# Patient Record
Sex: Male | Born: 1935 | Race: Black or African American | Hispanic: No | Marital: Married | Smoking: Never smoker
Health system: Southern US, Community
[De-identification: ages and names within clinical notes are randomized; demographics above are authoritative.]

## PROBLEM LIST (undated history)

## (undated) DIAGNOSIS — N419 Inflammatory disease of prostate, unspecified: Secondary | ICD-10-CM

## (undated) DIAGNOSIS — Z9181 History of falling: Secondary | ICD-10-CM

## (undated) DIAGNOSIS — Z8546 Personal history of malignant neoplasm of prostate: Secondary | ICD-10-CM

## (undated) DIAGNOSIS — R569 Unspecified convulsions: Secondary | ICD-10-CM

## (undated) DIAGNOSIS — I10 Essential (primary) hypertension: Secondary | ICD-10-CM

## (undated) DIAGNOSIS — R7309 Other abnormal glucose: Secondary | ICD-10-CM

## (undated) DIAGNOSIS — G4733 Obstructive sleep apnea (adult) (pediatric): Secondary | ICD-10-CM

## (undated) DIAGNOSIS — I351 Nonrheumatic aortic (valve) insufficiency: Secondary | ICD-10-CM

## (undated) DIAGNOSIS — E739 Lactose intolerance, unspecified: Secondary | ICD-10-CM

## (undated) DIAGNOSIS — F068 Other specified mental disorders due to known physiological condition: Secondary | ICD-10-CM

## (undated) DIAGNOSIS — Z8679 Personal history of other diseases of the circulatory system: Secondary | ICD-10-CM

## (undated) DIAGNOSIS — I77810 Thoracic aortic ectasia: Secondary | ICD-10-CM

## (undated) HISTORY — DX: History of falling: Z91.81

## (undated) HISTORY — DX: Personal history of other diseases of the circulatory system: Z86.79

## (undated) HISTORY — DX: Unspecified convulsions: R56.9

## (undated) HISTORY — DX: Personal history of malignant neoplasm of prostate: Z85.46

## (undated) HISTORY — DX: Other specified mental disorders due to known physiological condition: F06.8

## (undated) HISTORY — PX: CHOLECYSTECTOMY: SHX55

## (undated) HISTORY — PX: PROSTATE SURGERY: SHX751

## (undated) HISTORY — DX: Lactose intolerance, unspecified: E73.9

## (undated) HISTORY — DX: Nonrheumatic aortic (valve) insufficiency: I35.1

## (undated) HISTORY — DX: Essential (primary) hypertension: I10

## (undated) HISTORY — DX: Other abnormal glucose: R73.09

## (undated) HISTORY — PX: GANGLION CYST EXCISION: SHX1691

## (undated) HISTORY — DX: Obstructive sleep apnea (adult) (pediatric): G47.33

---

## 1998-05-16 ENCOUNTER — Other Ambulatory Visit: Admission: RE | Admit: 1998-05-16 | Discharge: 1998-05-16 | Payer: Self-pay | Admitting: Gastroenterology

## 1999-08-15 ENCOUNTER — Ambulatory Visit (HOSPITAL_COMMUNITY): Admission: RE | Admit: 1999-08-15 | Discharge: 1999-08-16 | Payer: Self-pay | Admitting: Surgery

## 1999-08-15 ENCOUNTER — Encounter: Payer: Self-pay | Admitting: Surgery

## 2000-05-19 ENCOUNTER — Encounter: Payer: Self-pay | Admitting: Internal Medicine

## 2000-05-19 LAB — CONVERTED CEMR LAB

## 2004-02-14 ENCOUNTER — Ambulatory Visit (HOSPITAL_BASED_OUTPATIENT_CLINIC_OR_DEPARTMENT_OTHER): Admission: RE | Admit: 2004-02-14 | Discharge: 2004-02-14 | Payer: Self-pay | Admitting: Internal Medicine

## 2004-03-13 ENCOUNTER — Ambulatory Visit: Payer: Self-pay | Admitting: Internal Medicine

## 2004-05-07 ENCOUNTER — Ambulatory Visit: Payer: Self-pay | Admitting: Internal Medicine

## 2004-07-05 ENCOUNTER — Ambulatory Visit: Payer: Self-pay | Admitting: Internal Medicine

## 2004-07-12 ENCOUNTER — Ambulatory Visit: Payer: Self-pay | Admitting: Internal Medicine

## 2004-08-06 ENCOUNTER — Ambulatory Visit: Payer: Self-pay | Admitting: Internal Medicine

## 2004-10-07 ENCOUNTER — Ambulatory Visit: Payer: Self-pay | Admitting: Internal Medicine

## 2004-12-18 ENCOUNTER — Ambulatory Visit: Payer: Self-pay | Admitting: Internal Medicine

## 2005-01-15 ENCOUNTER — Ambulatory Visit: Payer: Self-pay | Admitting: Internal Medicine

## 2005-02-25 ENCOUNTER — Ambulatory Visit: Payer: Self-pay | Admitting: Internal Medicine

## 2005-03-21 ENCOUNTER — Ambulatory Visit (HOSPITAL_COMMUNITY): Admission: RE | Admit: 2005-03-21 | Discharge: 2005-03-21 | Payer: Self-pay | Admitting: Urology

## 2005-04-16 ENCOUNTER — Ambulatory Visit: Payer: Self-pay | Admitting: Internal Medicine

## 2005-04-23 ENCOUNTER — Ambulatory Visit: Payer: Self-pay | Admitting: Internal Medicine

## 2005-06-04 ENCOUNTER — Ambulatory Visit: Payer: Self-pay | Admitting: Internal Medicine

## 2005-07-30 ENCOUNTER — Ambulatory Visit: Payer: Self-pay | Admitting: Internal Medicine

## 2005-08-27 ENCOUNTER — Ambulatory Visit: Payer: Self-pay | Admitting: Internal Medicine

## 2005-10-17 ENCOUNTER — Ambulatory Visit: Payer: Self-pay | Admitting: Internal Medicine

## 2005-11-21 ENCOUNTER — Ambulatory Visit: Payer: Self-pay | Admitting: Internal Medicine

## 2006-01-01 ENCOUNTER — Ambulatory Visit: Payer: Self-pay | Admitting: Internal Medicine

## 2006-04-06 ENCOUNTER — Ambulatory Visit: Payer: Self-pay | Admitting: Internal Medicine

## 2006-07-08 ENCOUNTER — Ambulatory Visit: Payer: Self-pay | Admitting: Internal Medicine

## 2006-07-08 LAB — CONVERTED CEMR LAB
ALT: 19 units/L (ref 0–40)
AST: 23 units/L (ref 0–37)
Albumin: 3.5 g/dL (ref 3.5–5.2)
Alkaline Phosphatase: 78 units/L (ref 39–117)
BUN: 14 mg/dL (ref 6–23)
Bilirubin, Direct: 0.1 mg/dL (ref 0.0–0.3)
CO2: 29 meq/L (ref 19–32)
Calcium: 9.6 mg/dL (ref 8.4–10.5)
Chloride: 104 meq/L (ref 96–112)
Cholesterol: 163 mg/dL (ref 0–200)
Creatinine, Ser: 0.7 mg/dL (ref 0.4–1.5)
GFR calc Af Amer: 143 mL/min
GFR calc non Af Amer: 118 mL/min
Glucose, Bld: 113 mg/dL — ABNORMAL HIGH (ref 70–99)
HDL: 60.9 mg/dL (ref >39.0)
LDL Cholesterol: 76 mg/dL (ref 0–99)
Potassium: 3.6 meq/L (ref 3.5–5.1)
Sodium: 142 meq/L (ref 135–145)
Total Bilirubin: 1 mg/dL (ref 0.3–1.2)
Total CHOL/HDL Ratio: 2.7
Total Protein: 7.5 g/dL (ref 6.0–8.3)
Triglycerides: 133 mg/dL (ref 0–149)
VLDL: 27 mg/dL (ref 0–40)

## 2006-10-07 ENCOUNTER — Ambulatory Visit: Payer: Self-pay | Admitting: Internal Medicine

## 2006-12-29 ENCOUNTER — Encounter: Payer: Self-pay | Admitting: Internal Medicine

## 2006-12-29 DIAGNOSIS — G4733 Obstructive sleep apnea (adult) (pediatric): Secondary | ICD-10-CM

## 2006-12-29 DIAGNOSIS — I10 Essential (primary) hypertension: Secondary | ICD-10-CM

## 2006-12-29 DIAGNOSIS — Z8546 Personal history of malignant neoplasm of prostate: Secondary | ICD-10-CM

## 2006-12-29 HISTORY — DX: Personal history of malignant neoplasm of prostate: Z85.46

## 2006-12-29 HISTORY — DX: Obstructive sleep apnea (adult) (pediatric): G47.33

## 2006-12-29 HISTORY — DX: Essential (primary) hypertension: I10

## 2007-02-09 ENCOUNTER — Ambulatory Visit: Payer: Self-pay | Admitting: Internal Medicine

## 2007-02-09 DIAGNOSIS — Z8673 Personal history of transient ischemic attack (TIA), and cerebral infarction without residual deficits: Secondary | ICD-10-CM

## 2007-02-09 DIAGNOSIS — Z8679 Personal history of other diseases of the circulatory system: Secondary | ICD-10-CM

## 2007-02-09 HISTORY — DX: Personal history of other diseases of the circulatory system: Z86.79

## 2007-02-24 ENCOUNTER — Encounter: Admission: RE | Admit: 2007-02-24 | Discharge: 2007-04-19 | Payer: Self-pay | Admitting: Internal Medicine

## 2007-03-22 ENCOUNTER — Encounter: Payer: Self-pay | Admitting: Internal Medicine

## 2007-04-14 ENCOUNTER — Encounter: Payer: Self-pay | Admitting: Internal Medicine

## 2007-05-12 ENCOUNTER — Ambulatory Visit: Payer: Self-pay | Admitting: Internal Medicine

## 2007-05-12 DIAGNOSIS — G309 Alzheimer's disease, unspecified: Secondary | ICD-10-CM

## 2007-05-12 DIAGNOSIS — F068 Other specified mental disorders due to known physiological condition: Secondary | ICD-10-CM

## 2007-05-12 DIAGNOSIS — F0280 Dementia in other diseases classified elsewhere without behavioral disturbance: Secondary | ICD-10-CM

## 2007-05-12 HISTORY — DX: Other specified mental disorders due to known physiological condition: F06.8

## 2007-05-13 LAB — CONVERTED CEMR LAB
Basophils Absolute: 0 10*3/uL (ref 0.0–0.1)
CO2: 31 meq/L (ref 19–32)
Chloride: 102 meq/L (ref 96–112)
Cholesterol: 195 mg/dL (ref 0–200)
Eosinophils Absolute: 0.1 10*3/uL (ref 0.0–0.6)
Eosinophils Relative: 1.4 % (ref 0.0–5.0)
GFR calc non Af Amer: 53 mL/min
Hemoglobin: 15.1 g/dL (ref 13.0–17.0)
Monocytes Absolute: 0.8 10*3/uL — ABNORMAL HIGH (ref 0.2–0.7)
Neutro Abs: 4.9 10*3/uL (ref 1.4–7.7)
Neutrophils Relative %: 61 % (ref 43.0–77.0)
Platelets: 230 10*3/uL (ref 150–400)
RDW: 13.6 % (ref 11.5–14.6)
Triglycerides: 105 mg/dL (ref 0–149)
VLDL: 21 mg/dL (ref 0–40)

## 2007-08-18 ENCOUNTER — Ambulatory Visit: Payer: Self-pay | Admitting: Internal Medicine

## 2007-08-18 LAB — HM COLONOSCOPY

## 2007-10-14 ENCOUNTER — Telehealth: Payer: Self-pay | Admitting: Internal Medicine

## 2007-12-08 ENCOUNTER — Ambulatory Visit: Payer: Self-pay | Admitting: Internal Medicine

## 2008-05-24 ENCOUNTER — Ambulatory Visit: Payer: Self-pay | Admitting: Internal Medicine

## 2008-05-25 LAB — CONVERTED CEMR LAB
BUN: 17 mg/dL (ref 6–23)
CO2: 32 meq/L (ref 19–32)
Creatinine, Ser: 1.2 mg/dL (ref 0.4–1.5)
GFR calc Af Amer: 76 mL/min
GFR calc non Af Amer: 63 mL/min
Glucose, Bld: 114 mg/dL — ABNORMAL HIGH (ref 70–99)
HDL: 61.5 mg/dL (ref >39.0)
LDL Cholesterol: 91 mg/dL (ref 0–99)
Potassium: 4.1 meq/L (ref 3.5–5.1)
Sodium: 142 meq/L (ref 135–145)
Triglycerides: 139 mg/dL (ref 0–149)

## 2008-09-26 ENCOUNTER — Encounter: Payer: Self-pay | Admitting: Internal Medicine

## 2008-12-12 ENCOUNTER — Ambulatory Visit: Payer: Self-pay | Admitting: Internal Medicine

## 2009-01-25 ENCOUNTER — Ambulatory Visit: Payer: Self-pay | Admitting: Internal Medicine

## 2009-03-07 ENCOUNTER — Telehealth: Payer: Self-pay | Admitting: Internal Medicine

## 2009-03-09 ENCOUNTER — Encounter: Payer: Self-pay | Admitting: Internal Medicine

## 2009-06-25 ENCOUNTER — Ambulatory Visit: Payer: Self-pay | Admitting: Internal Medicine

## 2009-06-26 LAB — CONVERTED CEMR LAB
Chloride: 105 meq/L (ref 96–112)
HDL: 68.9 mg/dL (ref >39.00)
TSH: 1.36 microintl units/mL (ref 0.35–5.50)
Total CHOL/HDL Ratio: 2
Triglycerides: 81 mg/dL (ref 0.0–149.0)
VLDL: 16.2 mg/dL (ref 0.0–40.0)

## 2009-07-27 ENCOUNTER — Ambulatory Visit: Payer: Self-pay | Admitting: Internal Medicine

## 2009-08-01 ENCOUNTER — Encounter: Payer: Self-pay | Admitting: Internal Medicine

## 2009-08-02 ENCOUNTER — Encounter: Payer: Self-pay | Admitting: Internal Medicine

## 2009-08-02 ENCOUNTER — Ambulatory Visit: Payer: Self-pay

## 2009-09-21 ENCOUNTER — Ambulatory Visit: Payer: Self-pay | Admitting: Internal Medicine

## 2009-09-21 DIAGNOSIS — R739 Hyperglycemia, unspecified: Secondary | ICD-10-CM

## 2009-09-21 DIAGNOSIS — R7309 Other abnormal glucose: Secondary | ICD-10-CM

## 2009-09-21 HISTORY — DX: Other abnormal glucose: R73.09

## 2009-09-21 LAB — CONVERTED CEMR LAB
GFR calc non Af Amer: 82.29 mL/min (ref >60)
Hgb A1c MFr Bld: 5.8 % (ref 4.6–6.5)
Potassium: 3.7 meq/L (ref 3.5–5.1)
Sodium: 146 meq/L — ABNORMAL HIGH (ref 135–145)

## 2009-10-26 ENCOUNTER — Observation Stay (HOSPITAL_COMMUNITY): Admission: EM | Admit: 2009-10-26 | Discharge: 2009-10-30 | Payer: Self-pay | Admitting: Emergency Medicine

## 2009-11-02 ENCOUNTER — Telehealth: Payer: Self-pay | Admitting: Internal Medicine

## 2009-11-12 ENCOUNTER — Ambulatory Visit: Payer: Self-pay | Admitting: Internal Medicine

## 2009-11-19 ENCOUNTER — Encounter: Payer: Self-pay | Admitting: Internal Medicine

## 2009-11-21 ENCOUNTER — Encounter: Payer: Self-pay | Admitting: Internal Medicine

## 2009-11-27 ENCOUNTER — Telehealth: Payer: Self-pay | Admitting: Internal Medicine

## 2009-11-28 ENCOUNTER — Encounter: Payer: Self-pay | Admitting: Internal Medicine

## 2009-11-29 ENCOUNTER — Encounter: Payer: Self-pay | Admitting: Internal Medicine

## 2009-12-04 ENCOUNTER — Encounter: Payer: Self-pay | Admitting: Internal Medicine

## 2009-12-07 ENCOUNTER — Inpatient Hospital Stay (HOSPITAL_COMMUNITY): Admission: EM | Admit: 2009-12-07 | Discharge: 2009-12-12 | Payer: Self-pay | Admitting: Emergency Medicine

## 2009-12-09 ENCOUNTER — Encounter (INDEPENDENT_AMBULATORY_CARE_PROVIDER_SITE_OTHER): Payer: Self-pay | Admitting: Internal Medicine

## 2009-12-09 ENCOUNTER — Ambulatory Visit: Payer: Self-pay | Admitting: Vascular Surgery

## 2009-12-10 ENCOUNTER — Encounter (INDEPENDENT_AMBULATORY_CARE_PROVIDER_SITE_OTHER): Payer: Self-pay | Admitting: Internal Medicine

## 2009-12-11 ENCOUNTER — Encounter: Payer: Self-pay | Admitting: Internal Medicine

## 2009-12-14 ENCOUNTER — Ambulatory Visit: Payer: Self-pay | Admitting: Internal Medicine

## 2010-01-04 ENCOUNTER — Ambulatory Visit: Payer: Self-pay | Admitting: Internal Medicine

## 2010-01-10 ENCOUNTER — Encounter: Payer: Self-pay | Admitting: Internal Medicine

## 2010-02-04 ENCOUNTER — Ambulatory Visit: Payer: Self-pay | Admitting: Internal Medicine

## 2010-02-25 ENCOUNTER — Encounter: Payer: Self-pay | Admitting: Internal Medicine

## 2010-04-05 ENCOUNTER — Ambulatory Visit: Payer: Self-pay | Admitting: Internal Medicine

## 2010-04-08 LAB — CONVERTED CEMR LAB
Creatinine, Ser: 1.3 mg/dL (ref 0.4–1.5)
Potassium: 3.7 meq/L (ref 3.5–5.1)
Sodium: 145 meq/L (ref 135–145)

## 2010-05-27 ENCOUNTER — Telehealth: Payer: Self-pay | Admitting: Internal Medicine

## 2010-06-06 NOTE — Progress Notes (Signed)
Summary: Speech Therapy Order  Phone Note From Other Clinic   Caller: [facility] Call For: Dr. Cato Mulligan Summary of Call: Evaluate and treat pt for Speech Therapy order, please? [phone] Initial call taken by: Lynann Beaver CMA,  November 02, 2009 2:26 PM  Follow-up for Phone Call        ok Follow-up by: Birdie Sons MD,  November 03, 2009 6:29 PM  Additional Follow-up for Phone Call Additional follow up Details #1::        Verbal order given. Additional Follow-up by: Lynann Beaver CMA,  November 06, 2009 11:20 AM

## 2010-06-06 NOTE — Assessment & Plan Note (Signed)
Summary: ?mini stroke/cjr   Vital Signs:  Patient profile:   77 year old male Height:      68 inches Weight:      174 pounds BMI:     26.55 Temp:     98.0 degrees F oral Pulse rate:   74 / minute BP sitting:   160 / 98  (left arm) Cuff size:   regular  Vitals Entered By: Kern Reap CMA Duncan Dull) (July 27, 2009 8:10 AM)  Serial Vital Signs/Assessments:  Time      Position  BP       Pulse  Resp  Temp     By                     138/88                         Birdie Sons MD  CC: possible mini stroke Is Patient Diabetic? No   CC:  possible mini stroke.  History of Present Illness: comes in with wife he and she report that 2 days ago he had an acute episode of confusion and some "muscle weakness". Wife reports that he couldn't comprehend how to raise a glass of water, difficulty remembering how to get to bedroom. Pt slept well that night but didn't remember any of the acute events.  Otherwise has been doing well All other systems reviewed and were negative   Current Problems (verified): 1)  Dementia  (ICD-294.8) 2)  Cerebrovascular Accident, Hx of  (ICD-V12.50) 3)  Obstructive Sleep Apnea  (ICD-327.23) 4)  Hemorrhoids  (ICD-455.6) 5)  Hypertension  (ICD-401.9) 6)  Prostate Cancer, Hx of  (ICD-V10.46)  Current Medications (verified): 1)  Benicar Hct 40-12.5 Mg Tabs (Olmesartan Medoxomil-Hctz) .... One By Mouth Daily 2)  Felodipine 5 Mg Tb24 (Felodipine) .... Take 1 Tablet By Mouth Once A Day 3)  Lisinopril 40 Mg Tabs (Lisinopril) .... One By Mouth Daily 4)  Aspirin 325 Mg  Tbec (Aspirin) .... Once Daily 5)  Exelon 4.6 Mg/24hr  Pt24 (Rivastigmine) .... Change Every 24 Hrs 6)  Namenda 10 Mg Tabs (Memantine Hcl) .... Take 1 Tablet By Mouth Two Times A Day  Allergies (verified): No Known Drug Allergies  Past History:  Past Medical History: Last updated: 02/09/2007 Prostate cancer, hx of  2006 (small focus) Hypertension hemorrhoids lactose intolerance cerebrovascular  dysfunction obstructive sleep apnea Cerebrovascular accident, hx of  Past Surgical History: Last updated: 12/29/2006 Cholecystectomy  03/00 cryotherapy for prostate ca Vonita Moss) Ganglion cyst on wrist  Family History: Last updated: May 30, 2007 father deceased 28 unknown cause mother deceased stroke age 46 Family History of Stroke F 1st degree relative <60  Social History: Last updated: 02/09/2007 Married Never Smoked Regular exercise-no  Risk Factors: Exercise: no (02/09/2007)  Risk Factors: Smoking Status: never (06/25/2009)  Physical Exam  General:  alert and well-developed.   Head:  normocephalic and atraumatic.   Eyes:  pupils equal and pupils round.   Ears:  R ear normal and L ear normal.   Neck:  No deformities, masses, or tenderness noted. Chest Wall:  No deformities, masses, tenderness or gynecomastia noted. Lungs:  normal respiratory effort and no intercostal retractions.   Heart:  normal rate and regular rhythm.   Abdomen:  Bowel sounds positive,abdomen soft and non-tender without masses, organomegaly or hernias noted. Msk:  No deformity or scoliosis noted of thoracic or lumbar spine.   Neurologic:  cranial nerves II-XII intact and gait  normal.   Skin:  turgor normal and color normal.   Psych:  memory intact for recent and remote and normally interactive.     Impression & Recommendations:  Problem # 1:  DEMENTIA (ICD-294.8) seems to be stable  Problem # 2:  CEREBROVASCULAR ACCIDENT, HX OF (ICD-V12.50)  unclear whehter this acute episode was CVA  Orders: Doppler Referral (Doppler)  Problem # 3:  HYPERTENSION (ICD-401.9) only moderate control same meds for now See me 2-4 weeks His updated medication list for this problem includes:    Benicar Hct 40-12.5 Mg Tabs (Olmesartan medoxomil-hctz) ..... One by mouth daily    Felodipine 5 Mg Tb24 (Felodipine) .Marland Kitchen... Take 1 tablet by mouth once a day    Lisinopril 40 Mg Tabs (Lisinopril) ..... One by mouth  daily  Complete Medication List: 1)  Benicar Hct 40-12.5 Mg Tabs (Olmesartan medoxomil-hctz) .... One by mouth daily 2)  Felodipine 5 Mg Tb24 (Felodipine) .... Take 1 tablet by mouth once a day 3)  Lisinopril 40 Mg Tabs (Lisinopril) .... One by mouth daily 4)  Aspirin 325 Mg Tbec (Aspirin) .... Once daily 5)  Exelon 4.6 Mg/24hr Pt24 (Rivastigmine) .... Change every 24 hrs 6)  Namenda 10 Mg Tabs (Memantine hcl) .... Take 1 tablet by mouth two times a day 7)  Plavix 75 Mg Tabs (Clopidogrel bisulfate) .... Take 1 tab by mouth daily  Patient Instructions: 1)  Please schedule a follow-up appointment in 2-4 weeks. Prescriptions: PLAVIX 75 MG TABS (CLOPIDOGREL BISULFATE) Take 1 tab by mouth daily  #30 x 6   Entered and Authorized by:   Birdie Sons MD   Signed by:   Birdie Sons MD on 07/27/2009   Method used:   Electronically to        [facility]  [address] # [id number]* (retail)       [address]       [place], [place]  [address]       Ph: [phone] or [phone]       Fax: [phone]   RxID:   [id number]

## 2010-06-06 NOTE — Letter (Signed)
Summary: Generic Letter  [place] [facility]  [address] [place], [place] [address]   Phone: [phone]  Fax: [phone]    11/12/2009  Geoffery Beattie [address] [place], [place]  [address]  To whom it may concer,  Mr. Searfoss has been a long term patient of mine and has been diagnosed with dementia for several years. He is at a point where, in my opinion, he is no longer able to make medical or financial decisions related to his care. I would support the idea that his wife ([name] L. Busler) be given medical and legal power of attorney.    Sincerely, Birdie Sons MD  November 12, 2009 12:00 PM   Birdie Sons MD

## 2010-06-06 NOTE — Miscellaneous (Signed)
Summary: Physician's Interim Orders/[facility]  Physician's Interim Orders/[facility]   Imported By: Maryln Gottron 12/18/2009 09:49:58  _____________________________________________________________________  External Attachment:    Type:   Image     Comment:   External Document

## 2010-06-06 NOTE — Assessment & Plan Note (Signed)
Summary: post hos fup/njr   Vital Signs:  Patient profile:   77 year old male Height:      68 inches (172.72 cm) Weight:      173 pounds (78.64 kg) Temp:     98.8 degrees F (37.11 degrees C) oral Pulse rate:   76 / minute BP sitting:   154 / 84  (left arm) Cuff size:   regular  Vitals Entered By: Josph Macho RMA (November 12, 2009 11:20 AM) CC: Post hospital follow up/ CF   CC:  Post hospital follow up/ CF.  History of Present Illness:  Follow-Up Visit      This is a 77 year old man who presents for Follow-up visit.  The patient denies chest pain and palpitations.  Since the last visit the patient notes a recent hospitilization.  The patient reports taking meds as prescribed.  When questioned about possible medication side effects, the patient notes none.   reviewed hospital labs and summaries  All other systems reviewed and were negative   Current Medications (verified): 1)  Benicar Hct 40-12.5 Mg Tabs (Olmesartan Medoxomil-Hctz) .... One By Mouth Daily 2)  Felodipine 5 Mg Tb24 (Felodipine) .... Take 1 Tablet By Mouth Once A Day 3)  Lisinopril 40 Mg Tabs (Lisinopril) .... One By Mouth Daily 4)  Aspirin 325 Mg  Tbec (Aspirin) .... Once Daily 5)  Exelon 4.6 Mg/24hr  Pt24 (Rivastigmine) .... Change Every 24 Hrs 6)  Namenda 10 Mg Tabs (Memantine Hcl) .... Take 1 Tablet By Mouth Two Times A Day 7)  Plavix 75 Mg Tabs (Clopidogrel Bisulfate) .... Take 1 Tab By Mouth Daily  Allergies (verified): No Known Drug Allergies  Past History:  Past Medical History: Last updated: 02/09/2007 Prostate cancer, hx of  2006 (small focus) Hypertension hemorrhoids lactose intolerance cerebrovascular dysfunction obstructive sleep apnea Cerebrovascular accident, hx of  Past Surgical History: Last updated: 12/29/2006 Cholecystectomy  03/00 cryotherapy for prostate ca Vonita Moss) Ganglion cyst on wrist  Family History: Last updated: 2007-06-04 father deceased 78 unknown cause mother  deceased stroke age 84 Family History of Stroke F 1st degree relative <60  Social History: Last updated: 02/09/2007 Married Never Smoked Regular exercise-no  Risk Factors: Exercise: no (02/09/2007)  Risk Factors: Smoking Status: never (06/25/2009)  Physical Exam  General:  alert and well-developed.   Head:  normocephalic and atraumatic.   Eyes:  pupils equal and pupils round.   Ears:  R ear normal and L ear normal.   Neck:  No deformities, masses, or tenderness noted. Chest Wall:  No deformities, masses, tenderness or gynecomastia noted. Lungs:  normal respiratory effort and no intercostal retractions.   Heart:  normal rate and regular rhythm.     Impression & Recommendations:  Problem # 1:  DEMENTIA (ICD-294.8) had an acute exacerbation related to presumed metabolic encephalopathy from UTI has completed course of antibiotics note supporting evidence of elevated W BC and urine culture with 30K ecoli colonies  Problem # 2:  PROSTATE CANCER, HX OF (ICD-V10.46) probably related to the UTI--see hospital note  Problem # 3:  HYPERTENSION (ICD-401.9) I've asked him (wife) to monitor bp at homee---goal bp < 135/85 His updated medication list for this problem includes:    Benicar Hct 40-12.5 Mg Tabs (Olmesartan medoxomil-hctz) ..... One by mouth daily    Felodipine 5 Mg Tb24 (Felodipine) .Marland Kitchen... Take 1 tablet by mouth once a day    Lisinopril 40 Mg Tabs (Lisinopril) ..... One by mouth daily  BP today: 154/84 Prior  BP: 140/94 (09/21/2009)  Labs Reviewed: K+: 3.7 (09/21/2009) Creat: : 1.1 (09/21/2009)   Chol: 165 (06/25/2009)   HDL: 68.90 (06/25/2009)   LDL: 80 (06/25/2009)   TG: 81.0 (06/25/2009)  Complete Medication List: 1)  Benicar Hct 40-12.5 Mg Tabs (Olmesartan medoxomil-hctz) .... One by mouth daily 2)  Felodipine 5 Mg Tb24 (Felodipine) .... Take 1 tablet by mouth once a day 3)  Lisinopril 40 Mg Tabs (Lisinopril) .... One by mouth daily 4)  Aspirin 325 Mg Tbec  (Aspirin) .... Once daily 5)  Exelon 4.6 Mg/24hr Pt24 (Rivastigmine) .... Change every 24 hrs 6)  Namenda 10 Mg Tabs (Memantine hcl) .... Take 1 tablet by mouth two times a day 7)  Plavix 75 Mg Tabs (Clopidogrel bisulfate) .... Take 1 tab by mouth daily

## 2010-06-06 NOTE — Assessment & Plan Note (Signed)
Summary: 6 MTH ROV // RS   Vital Signs:  Patient profile:   77 year old male Weight:      178 pounds Temp:     98.2 degrees F Pulse rate:   76 / minute Resp:     12 per minute BP sitting:   144 / 88  (left arm)  Vitals Entered By: Gladis Riffle, RN (June 25, 2009 10:26 AM) CC: 6 month rov Is Patient Diabetic? No Comments needs refills benicar-hct, felodipine, and flector   CC:  6 month rov.  History of Present Illness:  Follow-Up Visit--pt here with wife      This is a 77 year old man who presents for Follow-up visit.  The patient denies chest pain and palpitations.  Since the last visit the patient notes no new problems or concerns.  The patient reports taking meds as prescribed.  When questioned about possible medication side effects, the patient notes none.    All other systems reviewed and were negative -- wife says patient keeps saliva in his mouth (able to swallow meals normally).  Wife also notes that he has trouble going up steps (fear of falling) Memory: progressive problem Wife states he has developed a fear of having objects close to him Cries frequently---ongoing for years---progressive  Preventive Screening-Counseling & Management  Alcohol-Tobacco     Smoking Status: never  Medications Prior to Update: 1)  Benicar Hct 40-12.5 Mg Tabs (Olmesartan Medoxomil-Hctz) .... One By Mouth Daily 2)  Felodipine 5 Mg Tb24 (Felodipine) .... Take 1 Tablet By Mouth Once A Day 3)  Lisinopril 40 Mg Tabs (Lisinopril) .... One By Mouth Daily 4)  Aspirin 325 Mg  Tbec (Aspirin) .... Once Daily 5)  Exelon 4.6 Mg/24hr  Pt24 (Rivastigmine) .... Change Every 24 Hrs 6)  Namenda 10 Mg Tabs (Memantine Hcl) .... Take 1 Tablet By Mouth Two Times A Day 7)  Flector 1.3 % Ptch (Diclofenac Epolamine) .... Apply To Painful Area Prn  Current Medications (verified): 1)  Benicar Hct 40-12.5 Mg Tabs (Olmesartan Medoxomil-Hctz) .... One By Mouth Daily 2)  Felodipine 5 Mg Tb24 (Felodipine) ....  Take 1 Tablet By Mouth Once A Day 3)  Lisinopril 40 Mg Tabs (Lisinopril) .... One By Mouth Daily 4)  Aspirin 325 Mg  Tbec (Aspirin) .... Once Daily 5)  Exelon 4.6 Mg/24hr  Pt24 (Rivastigmine) .... Change Every 24 Hrs 6)  Namenda 10 Mg Tabs (Memantine Hcl) .... Take 1 Tablet By Mouth Two Times A Day  Allergies (verified): No Known Drug Allergies  Past History:  Past Medical History: Last updated: 02/09/2007 Prostate cancer, hx of  2006 (small focus) Hypertension hemorrhoids lactose intolerance cerebrovascular dysfunction obstructive sleep apnea Cerebrovascular accident, hx of  Past Surgical History: Last updated: 12/29/2006 Cholecystectomy  03/00 cryotherapy for prostate ca Vonita Moss) Ganglion cyst on wrist  Family History: Last updated: Jun 03, 2007 father deceased 34 unknown cause mother deceased stroke age 22 Family History of Stroke F 1st degree relative <60  Social History: Last updated: 02/09/2007 Married Never Smoked Regular exercise-no  Risk Factors: Exercise: no (02/09/2007)  Risk Factors: Smoking Status: never (06/25/2009)  Physical Exam  General:  alert and well-developed.   Head:  normocephalic and atraumatic.   Eyes:  pupils equal and pupils round.   Neck:  No deformities, masses, or tenderness noted. Chest Wall:  No deformities, masses, tenderness or gynecomastia noted. Lungs:  normal respiratory effort and no intercostal retractions.   Heart:  normal rate and regular rhythm.   Abdomen:  Bowel sounds positive,abdomen soft and non-tender without masses, organomegaly or hernias noted. Msk:  No deformity or scoliosis noted of thoracic or lumbar spine.   Pulses:  R radial normal and L radial normal.   Neurologic:  cranial nerves II-XII intact and gait normal.     Impression & Recommendations:  Problem # 1:  DEMENTIA (ICD-294.8)  progressive problem continue current medications   Orders: TLB-TSH (Thyroid Stimulating Hormone)  (84443-TSH)  Problem # 2:  CEREBROVASCULAR ACCIDENT, HX OF (ICD-V12.50) no recurrence  Problem # 3:  HYPERTENSION (ICD-401.9)  reasonable control Wife will monitor---goal 135/85 or less.  His updated medication list for this problem includes:    Benicar Hct 40-12.5 Mg Tabs (Olmesartan medoxomil-hctz) ..... One by mouth daily    Felodipine 5 Mg Tb24 (Felodipine) .Marland Kitchen... Take 1 tablet by mouth once a day    Lisinopril 40 Mg Tabs (Lisinopril) ..... One by mouth daily  BP today: 144/88 Prior BP: 138/82 (01/25/2009)  Labs Reviewed: K+: 4.1 (05/24/2008) Creat: : 1.2 (05/24/2008)   Chol: 180 (05/24/2008)   HDL: 61.5 (05/24/2008)   LDL: 91 (05/24/2008)   TG: 139 (05/24/2008)  Orders: Venipuncture (82956) TLB-Lipid Panel (80061-LIPID) TLB-BMP (Basic Metabolic Panel-BMET) (80048-METABOL)  Problem # 4:  PROSTATE CANCER, HX OF (ICD-V10.46) followed by urology  Complete Medication List: 1)  Benicar Hct 40-12.5 Mg Tabs (Olmesartan medoxomil-hctz) .... One by mouth daily 2)  Felodipine 5 Mg Tb24 (Felodipine) .... Take 1 tablet by mouth once a day 3)  Lisinopril 40 Mg Tabs (Lisinopril) .... One by mouth daily 4)  Aspirin 325 Mg Tbec (Aspirin) .... Once daily 5)  Exelon 4.6 Mg/24hr Pt24 (Rivastigmine) .... Change every 24 hrs 6)  Namenda 10 Mg Tabs (Memantine hcl) .... Take 1 tablet by mouth two times a day

## 2010-06-06 NOTE — Letter (Signed)
Summary: [facility]  [facility]   Imported By: Maryln Gottron 02/12/2010 15:14:44  _____________________________________________________________________  External Attachment:    Type:   Image     Comment:   External Document

## 2010-06-06 NOTE — Miscellaneous (Signed)
Summary: Certification and Plan of Care/[facility]  Certification and Plan of Care/[facility]   Imported By: Maryln Gottron 02/07/2010 12:29:27  _____________________________________________________________________  External Attachment:    Type:   Image     Comment:   External Document

## 2010-06-06 NOTE — Assessment & Plan Note (Signed)
Summary: 3 month rov/njr   Vital Signs:  Patient profile:   77 year old male Weight:      169 pounds Temp:     98.6 degrees F oral Pulse rate:   84 / minute Pulse rhythm:   regular BP sitting:   142 / 94  (left arm) Cuff size:   regular  Vitals Entered By: Alfred Levins, CMA (April 05, 2010 9:38 AM) CC: f/u   CC:  f/u.  History of Present Illness:  Follow-Up Visit      This is a 77 year old man who presents for Follow-up visit.  The patient denies chest pain and palpitations.  Since the last visit the patient notes no new problems or concerns except wife reports an episode of diarrhea---resolving.  The patient reports taking meds as prescribed.  When questioned about possible medication side effects, the patient notes none.    All other systems reviewed and were negative --pt's wife states memory is stable  Current Problems (verified): 1)  Acute Prostatitis  (ICD-601.0) 2)  Hyperglycemia  (ICD-790.29) 3)  Dementia  (ICD-294.8) 4)  Cerebrovascular Accident, Hx of  (ICD-V12.50) 5)  Obstructive Sleep Apnea  (ICD-327.23) 6)  Hypertension  (ICD-401.9) 7)  Prostate Cancer, Hx of  (ICD-V10.46)  Current Medications (verified): 1)  Benicar Hct 40-12.5 Mg Tabs (Olmesartan Medoxomil-Hctz) .... One By Mouth Daily 2)  Felodipine 5 Mg Xr24h-Tab (Felodipine) .... Take 1 Tablet By Mouth Once A Day 3)  Aspirin 325 Mg  Tbec (Aspirin) .... Once Daily 4)  Exelon 4.6 Mg/24hr  Pt24 (Rivastigmine) .... Change Every 24 Hrs 5)  Namenda 10 Mg Tabs (Memantine Hcl) .... Take 1 Tablet By Mouth Two Times A Day 6)  Plavix 75 Mg Tabs (Clopidogrel Bisulfate) .... Take 1 Tab By Mouth Daily  Allergies (verified): No Known Drug Allergies  Past History:  Past Medical History: Last updated: 12/04/2009 Prostate cancer, hx of  2006 (small focus) Hypertension hemorrhoids lactose intolerance cerebrovascular dysfunction obstructive sleep apnea Cerebrovascular accident, hx of fall  risk  Past Surgical History: Last updated: 12/29/2006 Cholecystectomy  03/00 cryotherapy for prostate ca Vonita Moss) Ganglion cyst on wrist  Family History: Last updated: May 20, 2007 father deceased 36 unknown cause mother deceased stroke age 45 Family History of Stroke F 1st degree relative <60  Social History: Last updated: 02/09/2007 Married Never Smoked Regular exercise-no  Risk Factors: Exercise: no (12/14/2009)  Risk Factors: Smoking Status: never (12/14/2009)  Physical Exam  General:  can't remember grandkid's names. well-developed well-nourished male in no acute distress. HEENT exam atraumatic, normocephalic, neck supple. Chest clear to auscultation cardiac exam S1-S2 are regular. Is no clubbing cyanosis or edema. Neurologic exam he is alert. He can't remember simple things with his grand kids names. He doesn't know the date or time. He does know the Christmas is the next holiday.   Impression & Recommendations:  Problem # 1:  DEMENTIA (ICD-294.8) progressive but slow progression  Problem # 2:  HYPERTENSION (ICD-401.9)  reasonable control continue current medications  His updated medication list for this problem includes:    Benicar Hct 40-12.5 Mg Tabs (Olmesartan medoxomil-hctz) ..... One by mouth daily    Felodipine 5 Mg Xr24h-tab (Felodipine) .Marland Kitchen... Take 1 tablet by mouth once a day  BP today: 142/94 Prior BP: 140/90 (01/04/2010)  Labs Reviewed: K+: 3.7 (09/21/2009) Creat: : 1.1 (09/21/2009)   Chol: 165 (06/25/2009)   HDL: 68.90 (06/25/2009)   LDL: 80 (06/25/2009)   TG: 81.0 (06/25/2009)  Orders: Venipuncture (  [address]) TLB-BMP (Basic Metabolic Panel-BMET) (80048-METABOL)  Problem # 3:  HYPERGLYCEMIA (ICD-790.29)  check labs today Labs Reviewed: Creat: 1.1 (09/21/2009)     Orders: TLB-A1C / Hgb A1C (Glycohemoglobin) (83036-A1C)  Problem # 4:  PROSTATE CANCER, HX OF (ICD-V10.46) followed by urology  Complete Medication List: 1)  Benicar Hct  40-12.5 Mg Tabs (Olmesartan medoxomil-hctz) .... One by mouth daily 2)  Felodipine 5 Mg Xr24h-tab (Felodipine) .... Take 1 tablet by mouth once a day 3)  Aspirin 325 Mg Tbec (Aspirin) .... Once daily 4)  Exelon 4.6 Mg/24hr Pt24 (Rivastigmine) .... Change every 24 hrs 5)  Namenda 10 Mg Tabs (Memantine hcl) .... Take 1 tablet by mouth two times a day 6)  Plavix 75 Mg Tabs (Clopidogrel bisulfate) .... Take 1 tab by mouth daily  Patient Instructions: 1)  Please schedule a follow-up appointment in 3 months. Prescriptions: FELODIPINE 5 MG XR24H-TAB (FELODIPINE) Take 1 tablet by mouth once a day  #30 x 5   Entered by:   Alfred Levins, CMA   Authorized by:   Birdie Sons MD   Signed by:   Alfred Levins, CMA on 04/05/2010   Method used:   Electronically to        [id number]* (retail)       [address]       [place], [place]  [address]       Ph: [phone] or [phone]       Fax: [phone]   RxID:   [id number]    Orders Added: 1)  Est. Patient Level IV [66440] 2)  Venipuncture [34742] 3)  TLB-BMP (Basic Metabolic Panel-BMET) [80048-METABOL] 4)  TLB-A1C / Hgb A1C (Glycohemoglobin) [83036-A1C]  Appended Document: Orders Update    Clinical Lists Changes  Orders: Added new Service order of Specimen Handling (59563) - Signed

## 2010-06-06 NOTE — Miscellaneous (Signed)
Summary: Face to Face Encounter/[facility]  Face to Face Encounter/[facility]   Imported By: Maryln Gottron 03/07/2010 08:23:55  _____________________________________________________________________  External Attachment:    Type:   Image     Comment:   External Document

## 2010-06-06 NOTE — Progress Notes (Signed)
Summary: referral  Phone Note Call from Patient Call back at Home Phone [phone] Call back at [phone] ext [phone]   Caller: Patient Call For: Mike Williams Summary of Call: pt is taking Veramyst and has tried [employer].  He is still having issues.  He sees Dr Jearld Fenton, ENT but he would like to go see someone else and wants to know who you would recommend Initial call taken by: Alfred Levins, CMA,  May 27, 2010 4:00 PM  Follow-up for Phone Call        dr. Dallie Piles Follow-up by: Birdie Sons MD,  May 27, 2010 4:38 PM  Additional Follow-up for Phone Call Additional follow up Details #1::        Phone Call Completed Additional Follow-up by: Alfred Levins, CMA,  May 27, 2010 5:02 PM     Appended Document: referral wrong chart

## 2010-06-06 NOTE — Letter (Signed)
Summary: [facility]  [facility]   Imported By: Maryln Gottron 12/06/2009 15:57:27  _____________________________________________________________________  External Attachment:    Type:   Image     Comment:   External Document

## 2010-06-06 NOTE — Assessment & Plan Note (Signed)
Summary: post hos fup/njr   Vital Signs:  Patient profile:   77 year old male Weight:      167 pounds Temp:     98.4 degrees F oral Pulse rate:   72 / minute BP sitting:   110 / 70  (right arm) Cuff size:   regular  Vitals Entered By: Romualdo Bolk, CMA (AAMA) (December 14, 2009 10:07 AM) CC: Post Hospital Follow up   CC:  [facility] Follow up.  History of Present Illness:  Follow-Up Visit--pt here with wife and son      This is a 11 year old man who presents for Follow-up visit.  The patient denies chest pain and palpitations.  Since the last visit the patient notes a recent hospitilization.  The patient reports taking meds as prescribed.  When questioned about possible medication side effects, the patient notes none.    recently hospitalized for prosatitis---reviewed hospital records including culture results (negative) wife states memory has decreased sense hospitalization wife states urination is intermittent (sees dr Vonita Moss 01/11/10)  Preventive Screening-Counseling & Management  Alcohol-Tobacco     Smoking Status: never  Caffeine-Diet-Exercise     Does Patient Exercise: no  Current Medications (verified): 1)  Benicar Hct 40-12.5 Mg Tabs (Olmesartan Medoxomil-Hctz) .... One By Mouth Daily 2)  Felodipine 10 Mg Xr24h-Tab (Felodipine) .Marland Kitchen.. 1 By Mouth Once Daily 3)  Aspirin 325 Mg  Tbec (Aspirin) .... Once Daily 4)  Exelon 4.6 Mg/24hr  Pt24 (Rivastigmine) .... Change Every 24 Hrs 5)  Namenda 10 Mg Tabs (Memantine Hcl) .... Take 1 Tablet By Mouth Two Times A Day 6)  Plavix 75 Mg Tabs (Clopidogrel Bisulfate) .... Take 1 Tab By Mouth Daily 7)  Acetaminophen 325 Mg  Tabs (Acetaminophen) 8)  Pyridium 100 Mg Tabs (Phenazopyridine Hcl) .Marland Kitchen.. 1 By Mouth Three Times A Day 9)  Hydralazine 30mg  .... 2 By Mouth Three Times A Day  Allergies (verified): No Known Drug Allergies  Physical Exam  General:  alert and well-developed.   Head:  normocephalic and atraumatic.     Eyes:  pupils equal and pupils round.   Ears:  R ear normal and L ear normal.   Neck:  No deformities, masses, or tenderness noted. Chest Wall:  No deformities, masses, tenderness or gynecomastia noted. Lungs:  normal respiratory effort and no intercostal retractions.   Heart:  normal rate and regular rhythm.   Abdomen:  soft and non-tender.   Neurologic:  alert answers questions when spoken to but seems a little slower than usual   Impression & Recommendations:  Problem # 1:  DEMENTIA (ICD-294.8) I thinhk stable family has noted a decline after hospitalization suspect related to hospitalization and acute illnesss  Problem # 2:  PROSTATE CANCER, HX OF (ICD-V10.46) no known recurrence i suspect previous manipulation related to infection recurrence  Problem # 3:  ACUTE PROSTATITIS (ICD-601.0) resolving continue current medications   Problem # 4:  EDEMA- LOCALIZED (ICD-782.3) suspect related to higher dose felodipine decrease felodipine His updated medication list for this problem includes:    Benicar Hct 40-12.5 Mg Tabs (Olmesartan medoxomil-hctz) ..... One by mouth daily  Complete Medication List: 1)  Benicar Hct 40-12.5 Mg Tabs (Olmesartan medoxomil-hctz) .... One by mouth daily 2)  Felodipine 5 Mg Xr24h-tab (Felodipine) .... Take 1 tablet by mouth once a day 3)  Aspirin 325 Mg Tbec (Aspirin) .... Once daily 4)  Exelon 4.6 Mg/24hr Pt24 (Rivastigmine) .... Change every 24 hrs 5)  Namenda 10 Mg Tabs (  Memantine hcl) .... Take 1 tablet by mouth two times a day 6)  Plavix 75 Mg Tabs (Clopidogrel bisulfate) .... Take 1 tab by mouth daily 7)  Acetaminophen 325 Mg Tabs (Acetaminophen) 8)  Pyridium 100 Mg Tabs (Phenazopyridine hcl) .Marland Kitchen.. 1 by mouth three times a day as needed for urinary pain 9)  Hydralazine Hcl 25 Mg Tabs (Hydralazine hcl) .... Take 1 tablet by mouth three times a day  Patient Instructions: 1)  Please schedule a follow-up appointment in 1 month.

## 2010-06-06 NOTE — Assessment & Plan Note (Signed)
Summary: 3 month follow up/cjr rsc bmp ok per wife/njr   Vital Signs:  Patient profile:   77 year old male Weight:      175 pounds Temp:     98.4 degrees F oral Pulse rate:   66 / minute Pulse rhythm:   regular BP sitting:   140 / 94  (left arm) Cuff size:   regular  Vitals Entered By: Kern Reap CMA Duncan Dull) (Sep 21, 2009 10:33 AM)  Serial Vital Signs/Assessments:  Time      Position  BP       Pulse  Resp  Temp     By                     134/76                         Birdie Sons MD  CC: follow-up visit Is Patient Diabetic? No   CC:  follow-up visit.  History of Present Illness:  Follow-Up Visit      This is a 77 year old man who presents for Follow-up visit.  The patient denies chest pain and palpitations.  Since the last visit the patient notes no new problems or concerns.  The patient reports taking meds as prescribed.  When questioned about possible medication side effects, the patient notes none.    All other systems reviewed and were negative   Current Problems (verified): 1)  Dementia  (ICD-294.8) 2)  Cerebrovascular Accident, Hx of  (ICD-V12.50) 3)  Obstructive Sleep Apnea  (ICD-327.23) 4)  Hypertension  (ICD-401.9) 5)  Prostate Cancer, Hx of  (ICD-V10.46)  Current Medications (verified): 1)  Benicar Hct 40-12.5 Mg Tabs (Olmesartan Medoxomil-Hctz) .... One By Mouth Daily 2)  Felodipine 5 Mg Tb24 (Felodipine) .... Take 1 Tablet By Mouth Once A Day 3)  Lisinopril 40 Mg Tabs (Lisinopril) .... One By Mouth Daily 4)  Aspirin 325 Mg  Tbec (Aspirin) .... Once Daily 5)  Exelon 4.6 Mg/24hr  Pt24 (Rivastigmine) .... Change Every 24 Hrs 6)  Namenda 10 Mg Tabs (Memantine Hcl) .... Take 1 Tablet By Mouth Two Times A Day 7)  Plavix 75 Mg Tabs (Clopidogrel Bisulfate) .... Take 1 Tab By Mouth Daily  Allergies (verified): No Known Drug Allergies  Past History:  Past Medical History: Last updated: 02/09/2007 Prostate cancer, hx of  2006 (small  focus) Hypertension hemorrhoids lactose intolerance cerebrovascular dysfunction obstructive sleep apnea Cerebrovascular accident, hx of  Past Surgical History: Last updated: 12/29/2006 Cholecystectomy  03/00 cryotherapy for prostate ca Vonita Moss) Ganglion cyst on wrist  Family History: Last updated: June 01, 2007 father deceased 28 unknown cause mother deceased stroke age 24 Family History of Stroke F 1st degree relative <60  Social History: Last updated: 02/09/2007 Married Never Smoked Regular exercise-no  Risk Factors: Exercise: no (02/09/2007)  Risk Factors: Smoking Status: never (06/25/2009)  Physical Exam  General:  alert and well-developed.   Head:  normocephalic and atraumatic.   Eyes:  pupils equal and pupils round.   Ears:  R ear normal and L ear normal.   Neck:  No deformities, masses, or tenderness noted. Lungs:  normal respiratory effort and no intercostal retractions.   Heart:  normal rate and regular rhythm.   Abdomen:  Bowel sounds positive,abdomen soft and non-tender without masses, organomegaly or hernias noted. Msk:  No deformity or scoliosis noted of thoracic or lumbar spine.   Neurologic:  cranial nerves II-XII intact and gait normal.  Impression & Recommendations:  Problem # 1:  DEMENTIA (ICD-294.8) [facility] letter for wife to assume POA  Problem # 2:  CEREBROVASCULAR ACCIDENT, HX OF (ICD-V12.50) no recurrence  Problem # 3:  HYPERTENSION (ICD-401.9) continue current medications  His updated medication list for this problem includes:    Benicar Hct 40-12.5 Mg Tabs (Olmesartan medoxomil-hctz) ..... One by mouth daily    Felodipine 5 Mg Tb24 (Felodipine) .Marland Kitchen... Take 1 tablet by mouth once a day    Lisinopril 40 Mg Tabs (Lisinopril) ..... One by mouth daily  Orders: TLB-BMP (Basic Metabolic Panel-BMET) (80048-METABOL) TLB-A1C / Hgb A1C (Glycohemoglobin) (83036-A1C) post traumatic stress---evaluated by  [facility]  Complete Medication List: 1)  Benicar Hct 40-12.5 Mg Tabs (Olmesartan medoxomil-hctz) .... One by mouth daily 2)  Felodipine 5 Mg Tb24 (Felodipine) .... Take 1 tablet by mouth once a day 3)  Lisinopril 40 Mg Tabs (Lisinopril) .... One by mouth daily 4)  Aspirin 325 Mg Tbec (Aspirin) .... Once daily 5)  Exelon 4.6 Mg/24hr Pt24 (Rivastigmine) .... Change every 24 hrs 6)  Namenda 10 Mg Tabs (Memantine hcl) .... Take 1 tablet by mouth two times a day 7)  Plavix 75 Mg Tabs (Clopidogrel bisulfate) .... Take 1 tab by mouth daily  Other Orders: Venipuncture (82956) Prescriptions: EXELON 4.6 MG/24HR  PT24 (RIVASTIGMINE) change every 24 hrs  #90 x 3   Entered and Authorized by:   Birdie Sons MD   Signed by:   Birdie Sons MD on 09/21/2009   Method used:   Electronically to        [facility]  [address] [id number]* (retail)       [address]       [place], [place]  [address]       Ph: [phone] or [phone]       Fax: [phone]   RxID:   [id number] LISINOPRIL 40 MG TABS (LISINOPRIL) one by mouth daily  #90 x 3   Entered and Authorized by:   Birdie Sons MD   Signed by:   Birdie Sons MD on 09/21/2009   Method used:   Electronically to        [facility]  [address] [id number]* (retail)       [address]       [place], [place]  [address]       Ph: [phone] or [phone]       Fax: [phone]   RxID:   [id number]

## 2010-06-06 NOTE — Progress Notes (Signed)
Summary: additional pt request  Phone Note From Other Clinic Call back at [phone]   Caller: pt with [facility]--danielle    Summary of Call: Seeing pt for balance and strengthening. would like an order for extension for 3x week x weeks effective 12-10-2009. she would like a verbal. Duwayne Heck will be on vacation next week. So pt will need to be put on hold for next week and resume on 12-10-2009. pt only request to see her for physical therapy. Is this ok to order? Initial call taken by: Warnell Forester,  November 27, 2009 1:15 PM  Follow-up for Phone Call        ok Follow-up by: Birdie Sons MD,  November 27, 2009 5:08 PM  Additional Follow-up for Phone Call Additional follow up Details #1::        order faxed Additional Follow-up by: Kern Reap CMA Duncan Dull),  November 28, 2009 11:04 AM

## 2010-06-06 NOTE — Assessment & Plan Note (Signed)
Summary: Follow up/cb   Vital Signs:  Patient profile:   77 year old male Weight:      165 pounds Temp:     98.0 degrees F oral BP sitting:   140 / 90  (left arm) Cuff size:   regular  Vitals Entered By: Kern Reap CMA Duncan Dull) (January 04, 2010 10:29 AM)  Serial Vital Signs/Assessments:  Time      Position  BP       Pulse  Resp  Temp     By                     122/70                         Birdie Sons MD  CC: follow-up visit   CC:  follow-up visit.  History of Present Illness:  Follow-Up Visit      This is a 77 year old man who presents for Follow-up visit.  The patient denies chest pain and palpitations.  Since the last visit the patient notes no new problems or concerns.  The patient reports taking meds as prescribed.  When questioned about possible medication side effects, the patient notes none.    no other complaints---   Current Medications (verified): 1)  Benicar Hct 40-12.5 Mg Tabs (Olmesartan Medoxomil-Hctz) .... One By Mouth Daily 2)  Felodipine 5 Mg Xr24h-Tab (Felodipine) .... Take 1 Tablet By Mouth Once A Day 3)  Aspirin 325 Mg  Tbec (Aspirin) .... Once Daily 4)  Exelon 4.6 Mg/24hr  Pt24 (Rivastigmine) .... Change Every 24 Hrs 5)  Namenda 10 Mg Tabs (Memantine Hcl) .... Take 1 Tablet By Mouth Two Times A Day 6)  Plavix 75 Mg Tabs (Clopidogrel Bisulfate) .... Take 1 Tab By Mouth Daily 7)  Acetaminophen 325 Mg  Tabs (Acetaminophen) 8)  Pyridium 100 Mg Tabs (Phenazopyridine Hcl) .Marland Kitchen.. 1 By Mouth Three Times A Day As Needed For Urinary Pain  Allergies (verified): No Known Drug Allergies  Past History:  Past Medical History: Last updated: 12/04/2009 Prostate cancer, hx of  2006 (small focus) Hypertension hemorrhoids lactose intolerance cerebrovascular dysfunction obstructive sleep apnea Cerebrovascular accident, hx of fall risk  Past Surgical History: Last updated: 12/29/2006 Cholecystectomy  03/00 cryotherapy for prostate ca  Vonita Moss) Ganglion cyst on wrist  Family History: Last updated: 2007-06-01 father deceased 29 unknown cause mother deceased stroke age 101 Family History of Stroke F 1st degree relative <60  Social History: Last updated: 02/09/2007 Married Never Smoked Regular exercise-no  Risk Factors: Exercise: no (12/14/2009)  Risk Factors: Smoking Status: never (12/14/2009)  Review of Systems       Flu Vaccine Consent Questions     Do you have a history of severe allergic reactions to this vaccine? no    Any prior history of allergic reactions to egg and/or gelatin? no    Do you have a sensitivity to the preservative Thimersol? no    Do you have a past history of Guillan-Barre Syndrome? no    Do you currently have an acute febrile illness? no    Have you ever had a severe reaction to latex? no    Vaccine information given and explained to patient? yes    Are you currently pregnant? no    Lot Number:[id number]   Exp Date:11/02/2010   Site Given  Right  Deltoid IM   Physical Exam  General:  alert and well-developed.   Head:  normocephalic  and atraumatic.   Eyes:  pupils equal and pupils round.   Ears:  R ear normal and L ear normal.   Neck:  No deformities, masses, or tenderness noted. Chest Wall:  No deformities, masses, tenderness or gynecomastia noted. Lungs:  normal respiratory effort and no intercostal retractions.   Heart:  normal rate and regular rhythm.   Abdomen:  soft and non-tender.   Skin:  turgor normal and color normal.   Psych:  good eye contact and not anxious appearing.     Impression & Recommendations:  Problem # 1:  DEMENTIA (ICD-294.8) continue current medications   Problem # 2:  HYPERTENSION (ICD-401.9) bp controlled at home and repeat bp here controlled continue current medications  His updated medication list for this problem includes:    Benicar Hct 40-12.5 Mg Tabs (Olmesartan medoxomil-hctz) ..... One by mouth daily    Felodipine 5 Mg Xr24h-tab  (Felodipine) .Marland Kitchen... Take 1 tablet by mouth once a day  BP today: 140/90 Prior BP: 110/70 (12/14/2009)  Labs Reviewed: K+: 3.7 (09/21/2009) Creat: : 1.1 (09/21/2009)   Chol: 165 (06/25/2009)   HDL: 68.90 (06/25/2009)   LDL: 80 (06/25/2009)   TG: 81.0 (06/25/2009)  Problem # 3:  ACUTE PROSTATITIS (ICD-601.0) sxs resolved will remove from problem list  Complete Medication List: 1)  Benicar Hct 40-12.5 Mg Tabs (Olmesartan medoxomil-hctz) .... One by mouth daily 2)  Felodipine 5 Mg Xr24h-tab (Felodipine) .... Take 1 tablet by mouth once a day 3)  Aspirin 325 Mg Tbec (Aspirin) .... Once daily 4)  Exelon 4.6 Mg/24hr Pt24 (Rivastigmine) .... Change every 24 hrs 5)  Namenda 10 Mg Tabs (Memantine hcl) .... Take 1 tablet by mouth two times a day 6)  Plavix 75 Mg Tabs (Clopidogrel bisulfate) .... Take 1 tab by mouth daily 7)  Acetaminophen 325 Mg Tabs (Acetaminophen) 8)  Pyridium 100 Mg Tabs (Phenazopyridine hcl) .Marland Kitchen.. 1 by mouth three times a day as needed for urinary pain  Other Orders: Flu Vaccine 46yrs + (60454) Administration Flu vaccine - MCR (U9811)  Patient Instructions: 1)  Please schedule a follow-up appointment in 3 months.

## 2010-06-06 NOTE — Miscellaneous (Signed)
Summary: problem update  Clinical Lists Changes  Problems: Added new problem of RISK OF FALLING (ICD-V15.88) Observations: Added new observation of PAST MED HX: Prostate cancer, hx of  2006 (small focus) Hypertension hemorrhoids lactose intolerance cerebrovascular dysfunction obstructive sleep apnea Cerebrovascular accident, hx of fall risk (12/04/2009 11:55)      Past History:  Past Medical History: Prostate cancer, hx of  2006 (small focus) Hypertension hemorrhoids lactose intolerance cerebrovascular dysfunction obstructive sleep apnea Cerebrovascular accident, hx of fall risk

## 2010-06-06 NOTE — Miscellaneous (Signed)
Summary: Orders Update  Clinical Lists Changes  Orders: Added new Test order of Carotid Duplex (Carotid Duplex) - Signed 

## 2010-06-06 NOTE — Miscellaneous (Signed)
Summary: Physician's Interim Order/[facility]  Physician's Interim Order/[facility]   Imported By: Maryln Gottron 11/21/2009 11:17:50  _____________________________________________________________________  External Attachment:    Type:   Image     Comment:   External Document

## 2010-07-10 ENCOUNTER — Encounter: Payer: Self-pay | Admitting: Internal Medicine

## 2010-07-11 ENCOUNTER — Encounter: Payer: Self-pay | Admitting: Internal Medicine

## 2010-07-11 ENCOUNTER — Ambulatory Visit (INDEPENDENT_AMBULATORY_CARE_PROVIDER_SITE_OTHER): Payer: Medicare Other | Admitting: Internal Medicine

## 2010-07-11 DIAGNOSIS — F068 Other specified mental disorders due to known physiological condition: Secondary | ICD-10-CM

## 2010-07-11 MED ORDER — CLOPIDOGREL BISULFATE 75 MG PO TABS: 75.0000 mg | ORAL_TABLET | Freq: Every day | ORAL | Status: DC

## 2010-07-11 MED ORDER — RIVASTIGMINE 4.6 MG/24HR TD PT24: 1.0000 | MEDICATED_PATCH | Freq: Every day | TRANSDERMAL | Status: DC

## 2010-07-11 MED ORDER — ARMODAFINIL 150 MG PO TABS: 150.0000 mg | ORAL_TABLET | Freq: Every day | ORAL | Status: AC

## 2010-07-16 NOTE — Assessment & Plan Note (Signed)
This is clearly patient;s most significant problem Most bothersome seems to be daytime hypersomnolence---discussed with patient and wife Reasonable to try nuvigil Side effects discussed  See me back in one month

## 2010-07-16 NOTE — Progress Notes (Signed)
  Subjective:    Patient ID: Mike Williams, male    DOB: June 21, 1932, 77 y.o.   MRN: [id number]  HPI  Pt presents with wife to f/u multiple medical problems (see AP) Most significantly he has dementia. Wife thinks sxs are progressive He has daytime hypersomnolence---sleeps 17 hours daily  Prostate CA---followed by urology  HTN---no home bps  Past Medical History  Diagnosis Date  . CEREBROVASCULAR ACCIDENT, HX OF 02/09/2007  . DEMENTIA 05/12/2007  . HYPERGLYCEMIA 09/21/2009  . HYPERTENSION 12/29/2006  . OBSTRUCTIVE SLEEP APNEA 12/29/2006  . PROSTATE CANCER, HX OF 12/29/2006  . Hemorrhoids   . Lactose intolerance   . Risk for falls    Past Surgical History  Procedure Date  . Cholecystectomy   . Prostate surgery     cryotherapy    reports that he has never smoked. He does not have any smokeless tobacco history on file. His alcohol and drug histories not on file. family history is not on file. No Known Allergies   Review of Systems Pt denies everythin in a complete ROS---wife endorses hypersomnolence and mildly diminished appetite    Objective:   Physical Exam  well-developed well-nourished male in no acute distress. HEENT exam atraumatic, normocephalic, neck supple without jugular venous distention. Chest clear to auscultation cardiac exam S1-S2 are regular. Abdominal exam overweight with bowel sounds, soft and nontender. Extremities no edema. Neurologic exam is alert with a normal gait.        Assessment & Plan:

## 2010-07-19 LAB — BASIC METABOLIC PANEL
BUN: 15 mg/dL (ref 6–23)
BUN: 8 mg/dL (ref 6–23)
BUN: 9 mg/dL (ref 6–23)
BUN: 9 mg/dL (ref 6–23)
CO2: 26 mEq/L (ref 19–32)
CO2: 27 mEq/L (ref 19–32)
Calcium: 8.4 mg/dL (ref 8.4–10.5)
Calcium: 8.5 mg/dL (ref 8.4–10.5)
Calcium: 8.6 mg/dL (ref 8.4–10.5)
Calcium: 8.8 mg/dL (ref 8.4–10.5)
Chloride: 104 mEq/L (ref 96–112)
Chloride: 98 mEq/L (ref 96–112)
Creatinine, Ser: 1.15 mg/dL (ref 0.4–1.5)
Creatinine, Ser: 1.21 mg/dL (ref 0.4–1.5)
Creatinine, Ser: 1.27 mg/dL (ref 0.4–1.5)
GFR calc Af Amer: >60 mL/min (ref >60)
GFR calc Af Amer: >60 mL/min (ref >60)
GFR calc Af Amer: >60 mL/min (ref >60)
GFR calc non Af Amer: >60 mL/min (ref >60)
GFR calc non Af Amer: >60 mL/min (ref >60)
GFR calc non Af Amer: >60 mL/min (ref >60)
Glucose, Bld: 104 mg/dL — ABNORMAL HIGH (ref 70–99)
Glucose, Bld: 107 mg/dL — ABNORMAL HIGH (ref 70–99)
Glucose, Bld: 123 mg/dL — ABNORMAL HIGH (ref 70–99)
Potassium: 3.1 mEq/L — ABNORMAL LOW (ref 3.5–5.1)
Potassium: 3.1 mEq/L — ABNORMAL LOW (ref 3.5–5.1)
Potassium: 3.2 mEq/L — ABNORMAL LOW (ref 3.5–5.1)
Potassium: 3.4 mEq/L — ABNORMAL LOW (ref 3.5–5.1)
Sodium: 137 mEq/L (ref 135–145)
Sodium: 137 mEq/L (ref 135–145)
Sodium: 141 mEq/L (ref 135–145)
Sodium: 141 mEq/L (ref 135–145)

## 2010-07-19 LAB — CBC
HCT: 34 % — ABNORMAL LOW (ref 39.0–52.0)
HCT: 35.9 % — ABNORMAL LOW (ref 39.0–52.0)
Hemoglobin: 11.6 g/dL — ABNORMAL LOW (ref 13.0–17.0)
Hemoglobin: 12.1 g/dL — ABNORMAL LOW (ref 13.0–17.0)
MCH: 28.9 pg (ref 26.0–34.0)
MCH: 29.2 pg (ref 26.0–34.0)
MCHC: 33.6 g/dL (ref 30.0–36.0)
MCHC: 33.8 g/dL (ref 30.0–36.0)
MCHC: 34 g/dL (ref 30.0–36.0)
MCV: 85.6 fL (ref 78.0–100.0)
MCV: 86.1 fL (ref 78.0–100.0)
Platelets: 199 10*3/uL (ref 150–400)
Platelets: 207 10*3/uL (ref 150–400)
Platelets: 214 10*3/uL (ref 150–400)
RBC: 4.17 MIL/uL — ABNORMAL LOW (ref 4.22–5.81)
RBC: 4.18 MIL/uL — ABNORMAL LOW (ref 4.22–5.81)
RBC: 4.52 MIL/uL (ref 4.22–5.81)
RDW: 14.6 % (ref 11.5–15.5)
RDW: 14.7 % (ref 11.5–15.5)
RDW: 14.8 % (ref 11.5–15.5)
RDW: 14.9 % (ref 11.5–15.5)
WBC: 10.5 10*3/uL (ref 4.0–10.5)
WBC: 6.5 10*3/uL (ref 4.0–10.5)
WBC: 7 10*3/uL (ref 4.0–10.5)
WBC: 8 10*3/uL (ref 4.0–10.5)

## 2010-07-19 LAB — URINE CULTURE
Colony Count: NO GROWTH
Colony Count: NO GROWTH
Culture  Setup Time: 201108051040
Culture  Setup Time: 201108061758
Special Requests: NEGATIVE

## 2010-07-19 LAB — CULTURE, BLOOD (ROUTINE X 2)
Culture: NO GROWTH
Culture: NO GROWTH

## 2010-07-19 LAB — URINE MICROSCOPIC-ADD ON

## 2010-07-19 LAB — URINALYSIS, ROUTINE W REFLEX MICROSCOPIC
Bilirubin Urine: NEGATIVE
Protein, ur: 30 mg/dL — AB
pH: 6.5 (ref 5.0–8.0)

## 2010-07-19 LAB — DIFFERENTIAL
Basophils Relative: 0 % (ref 0–1)
Eosinophils Absolute: 0 10*3/uL (ref 0.0–0.7)
Lymphocytes Relative: 7 % — ABNORMAL LOW (ref 12–46)

## 2010-07-19 LAB — LACTIC ACID, PLASMA: Lactic Acid, Venous: 1.2 mmol/L (ref 0.5–2.2)

## 2010-07-19 LAB — TECHNOLOGIST SMEAR REVIEW: Path Review: >20

## 2010-07-19 LAB — PROCALCITONIN: Procalcitonin: <0.5 ng/mL

## 2010-07-22 LAB — DIFFERENTIAL
Basophils Absolute: 0 10*3/uL (ref 0.0–0.1)
Basophils Relative: 0 % (ref 0–1)
Eosinophils Absolute: 0 10*3/uL (ref 0.0–0.7)
Eosinophils Absolute: 0.1 10*3/uL (ref 0.0–0.7)
Lymphocytes Relative: 7 % — ABNORMAL LOW (ref 12–46)
Lymphs Abs: 1.9 10*3/uL (ref 0.7–4.0)
Lymphs Abs: 2 10*3/uL (ref 0.7–4.0)
Monocytes Absolute: 1 10*3/uL (ref 0.1–1.0)
Monocytes Relative: 11 % (ref 3–12)
Monocytes Relative: 4 % (ref 3–12)
Monocytes Relative: 9 % (ref 3–12)
Neutro Abs: 14.9 10*3/uL — ABNORMAL HIGH (ref 1.7–7.7)
Neutro Abs: 6 10*3/uL (ref 1.7–7.7)
Neutro Abs: 8.2 10*3/uL — ABNORMAL HIGH (ref 1.7–7.7)
Neutrophils Relative %: 67 % (ref 43–77)
Neutrophils Relative %: 73 % (ref 43–77)
Neutrophils Relative %: 89 % — ABNORMAL HIGH (ref 43–77)

## 2010-07-22 LAB — COMPREHENSIVE METABOLIC PANEL
Albumin: 4 g/dL (ref 3.5–5.2)
Alkaline Phosphatase: 87 U/L (ref 39–117)
BUN: 17 mg/dL (ref 6–23)
CO2: 27 mEq/L (ref 19–32)
Chloride: 108 mEq/L (ref 96–112)
Creatinine, Ser: 1.3 mg/dL (ref 0.4–1.5)
GFR calc non Af Amer: 54 mL/min — ABNORMAL LOW (ref >60)
Glucose, Bld: 156 mg/dL — ABNORMAL HIGH (ref 70–99)
Potassium: 3.5 mEq/L (ref 3.5–5.1)
Total Bilirubin: 0.8 mg/dL (ref 0.3–1.2)

## 2010-07-22 LAB — POCT CARDIAC MARKERS
CKMB, poc: <1 ng/mL — ABNORMAL LOW (ref 1.0–8.0)
Myoglobin, poc: 285 ng/mL (ref 12–200)

## 2010-07-22 LAB — CULTURE, BLOOD (ROUTINE X 2)
Culture: NO GROWTH
Culture: NO GROWTH

## 2010-07-22 LAB — CSF CELL COUNT WITH DIFFERENTIAL
RBC Count, CSF: 639 /mm3 — ABNORMAL HIGH
WBC, CSF: 2 /mm3 (ref 0–5)

## 2010-07-22 LAB — URINALYSIS, ROUTINE W REFLEX MICROSCOPIC
Bilirubin Urine: NEGATIVE
Glucose, UA: NEGATIVE mg/dL
Protein, ur: NEGATIVE mg/dL
Specific Gravity, Urine: 1.015 (ref 1.005–1.030)

## 2010-07-22 LAB — BASIC METABOLIC PANEL
BUN: 14 mg/dL (ref 6–23)
CO2: 27 mEq/L (ref 19–32)
CO2: 27 mEq/L (ref 19–32)
CO2: 27 mEq/L (ref 19–32)
Calcium: 8.9 mg/dL (ref 8.4–10.5)
Calcium: 9.1 mg/dL (ref 8.4–10.5)
Calcium: 9.2 mg/dL (ref 8.4–10.5)
Chloride: 102 mEq/L (ref 96–112)
Creatinine, Ser: 1.23 mg/dL (ref 0.4–1.5)
Creatinine, Ser: 1.25 mg/dL (ref 0.4–1.5)
GFR calc Af Amer: >60 mL/min (ref >60)
GFR calc Af Amer: >60 mL/min (ref >60)
GFR calc Af Amer: >60 mL/min (ref >60)
GFR calc non Af Amer: 56 mL/min — ABNORMAL LOW (ref >60)
GFR calc non Af Amer: 58 mL/min — ABNORMAL LOW (ref >60)
GFR calc non Af Amer: >60 mL/min (ref >60)
Glucose, Bld: 101 mg/dL — ABNORMAL HIGH (ref 70–99)
Glucose, Bld: 110 mg/dL — ABNORMAL HIGH (ref 70–99)
Potassium: 3.6 mEq/L (ref 3.5–5.1)
Potassium: 4.1 mEq/L (ref 3.5–5.1)
Sodium: 137 mEq/L (ref 135–145)
Sodium: 137 mEq/L (ref 135–145)
Sodium: 138 mEq/L (ref 135–145)
Sodium: 140 mEq/L (ref 135–145)

## 2010-07-22 LAB — CBC
HCT: 38.5 % — ABNORMAL LOW (ref 39.0–52.0)
HCT: 42.2 % (ref 39.0–52.0)
HCT: 44.5 % (ref 39.0–52.0)
Hemoglobin: 13.1 g/dL (ref 13.0–17.0)
Hemoglobin: 13.5 g/dL (ref 13.0–17.0)
Hemoglobin: 14.2 g/dL (ref 13.0–17.0)
Hemoglobin: 15 g/dL (ref 13.0–17.0)
MCH: 29.3 pg (ref 26.0–34.0)
MCH: 29.4 pg (ref 26.0–34.0)
MCHC: 33.6 g/dL (ref 30.0–36.0)
MCHC: 34 g/dL (ref 30.0–36.0)
MCV: 86.4 fL (ref 78.0–100.0)
MCV: 87.1 fL (ref 78.0–100.0)
Platelets: 139 10*3/uL — ABNORMAL LOW (ref 150–400)
Platelets: ADEQUATE 10*3/uL (ref 150–400)
RBC: 4.57 MIL/uL (ref 4.22–5.81)
RBC: 4.8 MIL/uL (ref 4.22–5.81)
RBC: 5.11 MIL/uL (ref 4.22–5.81)
RDW: 14.7 % (ref 11.5–15.5)
WBC: 11.3 10*3/uL — ABNORMAL HIGH (ref 4.0–10.5)
WBC: 16.8 10*3/uL — ABNORMAL HIGH (ref 4.0–10.5)

## 2010-07-22 LAB — MAGNESIUM: Magnesium: 1.9 mg/dL (ref 1.5–2.5)

## 2010-07-22 LAB — URINE CULTURE

## 2010-07-22 LAB — PROTEIN AND GLUCOSE, CSF
Glucose, CSF: 83 mg/dL — ABNORMAL HIGH (ref 43–76)
Total  Protein, CSF: 48 mg/dL — ABNORMAL HIGH (ref 15–45)

## 2010-07-22 LAB — URINE MICROSCOPIC-ADD ON

## 2010-07-22 LAB — RPR: RPR Ser Ql: NONREACTIVE

## 2010-07-22 LAB — ANA: Anti Nuclear Antibody(ANA): NEGATIVE

## 2010-07-22 LAB — SEDIMENTATION RATE: Sed Rate: 15 mm/hr (ref 0–16)

## 2010-07-22 LAB — C-REACTIVE PROTEIN: CRP: 5.9 mg/dL — ABNORMAL HIGH (ref <0.6)

## 2010-08-20 ENCOUNTER — Ambulatory Visit (INDEPENDENT_AMBULATORY_CARE_PROVIDER_SITE_OTHER): Payer: Medicare Other | Admitting: Internal Medicine

## 2010-08-20 ENCOUNTER — Encounter: Payer: Self-pay | Admitting: Internal Medicine

## 2010-08-20 DIAGNOSIS — F068 Other specified mental disorders due to known physiological condition: Secondary | ICD-10-CM

## 2010-08-20 DIAGNOSIS — I10 Essential (primary) hypertension: Secondary | ICD-10-CM

## 2010-08-20 NOTE — Assessment & Plan Note (Signed)
Has not taken bp meds Resume meds We have called pharmacy for approval of current meds ([facility])

## 2010-08-20 NOTE — Patient Instructions (Signed)
Stop the namenda  If excessive sleepiness persists than stop the exelon patch

## 2010-08-22 NOTE — Progress Notes (Signed)
  Subjective:    Patient ID: Mike Williams, male    DOB: 05-01-1933, 77 y.o.   MRN: [id number]  HPI  Patient comes in with his wife for followup of multiple medical problems. Most significantly the patient has progressive dementia. He is frequently confused. No dangerous activities. The thing that bothers his wife the most is his excessive sleeping habits. He'll sleep 16-18 hours daily. Patient's wife wonders whether medications are contributing to hypersomnolence. The patient denies any specific complaints.  Patient also has hypertension. His tolerance medications without difficulty.  Patient has prostate cancer has regular followup with urology.   Past Medical History  Diagnosis Date  . CEREBROVASCULAR ACCIDENT, HX OF 02/09/2007  . DEMENTIA 05/12/2007  . HYPERGLYCEMIA 09/21/2009  . HYPERTENSION 12/29/2006  . OBSTRUCTIVE SLEEP APNEA 12/29/2006  . PROSTATE CANCER, HX OF 12/29/2006  . Hemorrhoids   . Lactose intolerance   . Risk for falls    Past Surgical History  Procedure Date  . Cholecystectomy   . Prostate surgery     cryotherapy    reports that he has never smoked. He does not have any smokeless tobacco history on file. His alcohol and drug histories not on file. family history includes Cancer in his sister; Heart attack in his father; and Heart disease in his father. No Known Allergies   Review of Systems     patient denies chest pain, shortness of breath, orthopnea. Denies lower extremity edema, abdominal pain, change in appetite, change in bowel movements. Patient denies rashes, musculoskeletal complaints. No other specific complaints in a complete review of systems.   Objective:   Physical Exam Well-developed male in no acute distress. HEENT exam atraumatic, normocephalic, neck supple. Chest clear to auscultation cardiac exam S1 and S2 are regular. Abdomen; soft, soft. Extremities no edema. Neurologic exam he is alert. He is a broad-based gait.       Assessment & Plan:

## 2010-08-22 NOTE — Assessment & Plan Note (Signed)
A long discussion with patient and his wife regarding medications for dementia. Discussed the studies documenting that these medications only helped minimally with behavioral issues related to dementia. Side effects for medications discussed. We jointly agreed to discontinue the Namenda. Patient's wife will call with any concerns.

## 2010-09-20 NOTE — Procedures (Signed)
Mike Williams, DICKISON NO.:  [id number]   MEDICAL RECORD NO.:  [id number]          PATIENT TYPE:  OUT   LOCATION:  SLEEP CENTER                 FACILITY:  [facility]   PHYSICIAN:  Marcelyn Bruins, [name] [facility] DATE OF BIRTH:  1932-06-12   DATE OF STUDY:  02/14/2004                              NOCTURNAL POLYSOMNOGRAM   REFERRING PHYSICIAN:  Dr. Birdie Sons.   INDICATION FOR THE STUDY:  Hypersomnia with sleep apnea.   SLEEP ARCHITECTURE:  The patient had a total sleep time of 215 minutes with  sleep efficiency of only 42%.  The patient did not maintain consistent sleep  until after 3 a.m.  The patient never achieved slow wave sleep and had very  little REM.  Sleep onset latency was prolonged, as was REM latency.   IMPRESSION:  1.  Mild to moderate obstructive sleep apnea with a respiratory disturbance      index of 17 events per hour and oxygen desaturation as low as 83%.  The      obstructive events occurred primarily in the supine position.  The      patient did not meet split night protocol since he did not achieve      consistent sleep until after 3 a.m.  2.  Mild snoring noted throughout the study.  3.  The patient had significant nocturia and an episode of enuresis.  It is      really unclear if this was due to his sleep apnea or due to an      overactive bladder.  Clinical correlation is suggested.  4.  No clinically significant cardiac arrhythmias.      KC/MEDQ  D:  03/04/2004 12:17:18  T:  03/04/2004 12:40:51  Job:  [id number]

## 2010-09-20 NOTE — Op Note (Signed)
NAMEHARRISON, Mike Williams                 ACCOUNT NO.:  [id number]   MEDICAL RECORD NO.:  [id number]          PATIENT TYPE:  AMB   LOCATION:  DAY                          FACILITY:  [facility]   PHYSICIAN:  [name], M.D.DATE OF BIRTH:  November 19, 1932   DATE OF PROCEDURE:  03/21/2005  DATE OF DISCHARGE:                                 OPERATIVE REPORT   PREOPERATIVE DIAGNOSIS:  Prostatic carcinoma stage T1c   POSTOPERATIVE DIAGNOSIS:  Prostatic carcinoma stage T1c   PROCEDURE:  Cryoablation of the prostate.   SURGEON:  Dr. Larey Dresser.   ANESTHESIA:  General.   INDICATIONS:  This 77 year old black male was found to have a PSA of 5.31  and prostate biopsy revealed Gleason grade 6 (3+ 3) carcinoma in 50% of the  tissue on the left side, the right side did not show cancer in the tissue  biopsied. He and his wife and family were extensively counseled about  therapy including surgery, radiation therapy and cryotherapy. He has some  medical considerations in that he has had some previous mini strokes and has  been on Plavix before. They opted for cryoablation of the prostate knowing  there was a high-risk of impotency with that and they were also counseled  about voiding problems, incontinence or rectal injury.   DESCRIPTION OF PROCEDURE:  The patient brought to the operating room, placed  in lithotomy position and external genitalia and lower abdomen were prepped  and draped in the usual fashion. He was cystoscoped and the anterior and  prostatic urethras were unremarkable, the bladder had some mild  trabeculation. The bladder was filled with irrigating fluid and under visual  control a stab wound was made in the suprapubic region through which a  microinvasive suprapubic tube was placed without difficulty and later sewn  in place with black silk. A Foley catheter was then put in the bladder and  transrectal ultrasound probe inserted and the prostate measured and at this  point  treatment planning was performed and four rows of ice rods with two  ice rods in a row were positioned to cover the prostate completely with care  to not put any ice rods in the anterior tissue to protect the urethra and  care to stay at least a 1/2 cm away from the Denonvilliers' fascia. At this  point, temperature probes were placed in Denonvilliers' fascia and also on  the external sphincter. Good rod position was documented on transverse and  sagittal views. He was then cystoscoped and a stiff guidewire placed in the  bladder over which the urethral warming device was placed in good position  and urethral warming begun. He then went through two freeze thaw cycles with  excellent ice formation and a nice flattened posterior line demarcating the  extent of the ice formation and also on sagittal views it was noted the  Denonvilliers' fascia correlated with the ice formation and the rectum was  spared  from freezing. At the end of the case, 20-minute urethral warming time was  continued after the second freeze cycle was complete. After that, the  perineum was treated with a pressure dressing and the suprapubic tube  connected to closed drainage and the patient taken to the recovery room in  good condition having tolerated the procedure well.      [name], [name]  Electronically Signed     LJP/MEDQ  D:  03/21/2005  T:  03/21/2005  Job:  [id number]

## 2010-09-20 NOTE — Op Note (Signed)
[place]  Patient:    [name], [name]                          MRN: [id number] Proc. Date: 08/15/99 Adm. Date:  [id number] Attending:  Abigail Miyamoto A                           Operative Report  PREOPERATIVE DIAGNOSIS:  Symptomatic cholelithiasis.  POSTOPERATIVE DIAGNOSIS:  Symptomatic cholelithiasis.  PROCEDURE:  Laparoscopic cholecystectomy.  SURGEON:  Abigail Miyamoto, [name]  ASSISTANT:  Sheppard Plumber. Earlene Plater, [name]  ANESTHESIA:  General endotracheal anesthesia and 0.25% Marcaine plain.  ESTIMATED BLOOD LOSS:  Minimal.  PROCEDURE IN DETAIL:  The patient was brought to the operating room and identified as Mike Williams.  He was placed supine on the operating room table and general anesthesia was induced.  The abdomen was then prepped and draped in the usual sterile fashion.  Using a #15 blade, a small transverse incision was made just below the umbilicus.  The incision was carried down bluntly to the abdominal wall fascia.  The fascia was then opened with the scalpel.  A hemostat was then used to pass into the peritoneal cavity.  Next, a 0 Vicryl pursestring suture was placed around the fascial opening.  The Hasson port was then placed through the opening and insufflation of the abdomen was begun.  Next, an 11 mm port was placed in the patients epigastrium and two 5 mm ports were placed in the patients right flank, all under direct vision.  The gallbladder was then identified, grasped and retracted above the liver bed.  Several adhesions of the gallbladder were then taken down with the cautery.  The hilum of the gallbladder was then identified.  The cystic artery and duct were dissected out.  The artery was clipped twice proximally and once distally and transected with the scissors.  The cystic duct was then identified, clipped twice proximally and once distally and transected, as well.  The gallbladder was then slowly dissected free from  the liver bed with the electrocautery.  The gallbladder was then grasped and removed through the umbilicus.  Hemostasis in the liver bed appeared to be achieved.  The abdomen was then thoroughly irrigated with normal saline.  Once the gallbladder was removed, all ports were then removed under direct vision.  The abdomen was then deflated. The 0 Vicryl at the umbilicus was then fastened in place.  All skin incisions were anesthetized with 0.25% Marcaine and then closed with 4-0 Vicryl sutures. Steri-Strips, gauze and tape were then applied.  The patient tolerated the procedure well.  All sponge, needle and instrument counts were correct at the end of the procedure.  The patient was then extubated in the operating room and taken in stable condition to the recovery room. DD:  08/15/99 TD:  08/15/99 Job: [id number]

## 2010-10-01 ENCOUNTER — Emergency Department (HOSPITAL_COMMUNITY): Payer: Medicare Other

## 2010-10-01 ENCOUNTER — Inpatient Hospital Stay (HOSPITAL_COMMUNITY)
Admission: EM | Admit: 2010-10-01 | Discharge: 2010-10-04 | DRG: 093 | Disposition: A | Discharge status: Home Health | Payer: Medicare Other | Attending: Neurology | Admitting: Neurology

## 2010-10-01 DIAGNOSIS — F079 Unspecified personality and behavioral disorder due to known physiological condition: Secondary | ICD-10-CM | POA: Diagnosis present

## 2010-10-01 DIAGNOSIS — R471 Dysarthria and anarthria: Principal | ICD-10-CM | POA: Diagnosis present

## 2010-10-01 DIAGNOSIS — Z7982 Long term (current) use of aspirin: Secondary | ICD-10-CM

## 2010-10-01 DIAGNOSIS — F068 Other specified mental disorders due to known physiological condition: Secondary | ICD-10-CM | POA: Diagnosis present

## 2010-10-01 DIAGNOSIS — N419 Inflammatory disease of prostate, unspecified: Secondary | ICD-10-CM | POA: Diagnosis present

## 2010-10-01 DIAGNOSIS — G518 Other disorders of facial nerve: Secondary | ICD-10-CM | POA: Diagnosis present

## 2010-10-01 DIAGNOSIS — Z7902 Long term (current) use of antithrombotics/antiplatelets: Secondary | ICD-10-CM

## 2010-10-01 DIAGNOSIS — I10 Essential (primary) hypertension: Secondary | ICD-10-CM | POA: Diagnosis present

## 2010-10-01 LAB — POCT I-STAT, CHEM 8
Calcium, Ion: 1.1 mmol/L — ABNORMAL LOW (ref 1.12–1.32)
Creatinine, Ser: 1.7 mg/dL — ABNORMAL HIGH (ref 0.4–1.5)
Glucose, Bld: 220 mg/dL — ABNORMAL HIGH (ref 70–99)
HCT: 43 % (ref 39.0–52.0)
Hemoglobin: 14.6 g/dL (ref 13.0–17.0)
Potassium: 3.4 mEq/L — ABNORMAL LOW (ref 3.5–5.1)
TCO2: 24 mmol/L (ref 0–100)

## 2010-10-01 LAB — COMPREHENSIVE METABOLIC PANEL
ALT: 18 U/L (ref 0–53)
Albumin: 3.9 g/dL (ref 3.5–5.2)
Alkaline Phosphatase: 91 U/L (ref 39–117)
Chloride: 105 mEq/L (ref 96–112)
Glucose, Bld: 219 mg/dL — ABNORMAL HIGH (ref 70–99)
Potassium: 3.3 mEq/L — ABNORMAL LOW (ref 3.5–5.1)
Sodium: 144 mEq/L (ref 135–145)
Total Protein: 8 g/dL (ref 6.0–8.3)

## 2010-10-01 LAB — CBC
HCT: 40 % (ref 39.0–52.0)
MCHC: 33.8 g/dL (ref 30.0–36.0)
Platelets: 213 10*3/uL (ref 150–400)
RDW: 13.4 % (ref 11.5–15.5)

## 2010-10-01 LAB — TROPONIN I: Troponin I: <0.3 ng/mL (ref <0.30)

## 2010-10-01 LAB — DIFFERENTIAL
Basophils Absolute: 0 10*3/uL (ref 0.0–0.1)
Eosinophils Absolute: 0.1 10*3/uL (ref 0.0–0.7)
Eosinophils Relative: 1 % (ref 0–5)
Lymphocytes Relative: 30 % (ref 12–46)
Monocytes Absolute: 0.4 10*3/uL (ref 0.1–1.0)

## 2010-10-01 LAB — PROTIME-INR: INR: 1.05 (ref 0.00–1.49)

## 2010-10-01 LAB — CK TOTAL AND CKMB (NOT AT ARMC): CK, MB: 1.3 ng/mL (ref 0.3–4.0)

## 2010-10-02 ENCOUNTER — Inpatient Hospital Stay (HOSPITAL_COMMUNITY): Payer: Medicare Other

## 2010-10-02 DIAGNOSIS — I359 Nonrheumatic aortic valve disorder, unspecified: Secondary | ICD-10-CM

## 2010-10-02 LAB — HEMOGLOBIN A1C
Hgb A1c MFr Bld: 5.7 % — ABNORMAL HIGH (ref <5.7)
Mean Plasma Glucose: 117 mg/dL — ABNORMAL HIGH (ref <117)

## 2010-10-02 LAB — LIPID PANEL
Total CHOL/HDL Ratio: 3 RATIO
VLDL: 17 mg/dL (ref 0–40)

## 2010-10-03 ENCOUNTER — Inpatient Hospital Stay (HOSPITAL_COMMUNITY): Payer: Medicare Other

## 2010-10-03 ENCOUNTER — Ambulatory Visit: Payer: Medicare Other | Admitting: Internal Medicine

## 2010-10-03 NOTE — Procedures (Unsigned)
REFERRING PHYSICIAN:  Marlan Palau, MD  HISTORY:  A 77 year old man for sudden onset speech arrest lasting few minutes with right facial droop.  Speech not slurred.  EEG is for further evaluation.  MEDICATIONS:  Aspirin, Benicar, hydrochlorothiazide, Prinivil, Plavix.  EEG DURATION:  22 minutes of EEG recording.  EEG DESCRIPTION:  This is a routine 18 channel adult EEG recording with 1 channel devoted to limited EKG recording.  Activation procedure is performed during the photic stimulation and the studies performed in awake state.  As the EEG opens up, I noticed that the EEG has significant vertical eye movement artifact and the technician has reported voluntarily patient is moving his eyes and will stop on request at some time as well.  The posterior dominant rhythm consist of 8 Hz frequency with an amplitude of 12-18 mV.  There is no asymmetry.  There are no electrographic seizures or epileptiform discharges recorded during the study.  No driving was noted with photic stimulation in posterior leads.  No sleep architecture was recorded during the study either.  EEG INTERPRETATION:  This is a normal awake EEG.  There is no evidence to suggest electrographic seizures or epileptiform discharges or an asymmetry on this study.          ______________________________ Levie Heritage, MD    [id number] D:  10/02/2010 17:00:59  T:  10/03/2010 06:32:47  Job #:  [id number]

## 2010-10-07 NOTE — H&P (Signed)
NAME:  Mike Williams, Mike Williams NO.:  [id number]  MEDICAL RECORD NO.:  [id number]           PATIENT TYPE:  I  LOCATION:  6737                         FACILITY:  [facility]  PHYSICIAN:  Marlan Palau, [name]  DATE OF BIRTH:  06/24/1932  DATE OF ADMISSION:  10/01/2010 DATE OF DISCHARGE:                             HISTORY & PHYSICAL   HISTORY OF PRESENT ILLNESS:  Mike Williams is a 77 year old black male, born 22-Oct-1932, with a history of hypertension and cerebrovascular disease.  This patient had an event while at home witnessed by the wife. The patient suddenly had speech arrest around the 9:45 p.m. tonight. The patient was pointing to his mouth, appeared to be diaphoretic.  The patient had onset of right facial droop.  The patient apparently had speech arrest only for few moments and began to be able to talk, but speech was slurred.  The patient had no obvious weakness on the arms or legs.  The patient was brought to the hospital by EMS, was noted to have some mild right facial droop but otherwise had to return of speech.  CT of the head showed no acute changes, but bifrontal white matter changes were seen.  The patient has chronic infarcts in the basal ganglia area and medial left temporal lobe.  The patient was seen by Neurology as a code stroke.  The patient had NIH stroke scale score of 3, was not felt to be a t-PA candidate due to minimal deficits at the time of examination.  The patient does have a history of dementia.  PAST MEDICAL HISTORY:  Significant for: 1. New onset of slurred speech, possible TIA. 2. Organic brain syndrome. 3. History of prostatitis. 4. Hypertension. 5. Cerebrovascular disease. 6. Prostate cancer. 7. Gallbladder resection. 8. Ganglion cyst resection from the wrist.  MEDICATIONS: 1. Benicar/HCT 40/12.5 mg one daily. 2. Felodipine 5 mg daily. 3. Lisinopril 40 mg daily. 4. Aspirin 325 mg daily. 5. Plavix 75 mg daily. 6. The patient  was recently taken off of Exelon patch and Namenda due     to drowsiness.  ALLERGIES:  The patient has no known allergies.  FAMILY MEDICAL HISTORY:  Both parents are passed away, etiology unknown. The patient has 2 brothers, 1 sister.  Both brothers have hypertension. One sister has cancer and passed away with cancer.  SOCIAL HISTORY:  The patient is married, he is retired, lives with his wife and lives in the [place] area.  Does not smoke or drink.  REVIEW OF SYSTEMS:  Notable for no recent fevers, chills.  The patient was somewhat diaphoretic.  Denies chest pains, abdominal pains.  Denies shortness of breath.  There is no problems controlling the bladder.  The patient did have some loose stools tonight.  PHYSICAL EXAMINATION:  VITAL SIGNS:  Blood pressure 143/77, heart rate 73, respiratory rate 18, temperature afebrile. GENERAL:  The patient is a fairly well-developed black male who is alert and cooperative at the time of examination. HEENT:  Head is atraumatic.  Eyes: Pupils are round and reactive to light. NECK:  Supple.  No carotid  bruits noted. RESPIRATORY:  Clear. CARDIOVASCULAR:  Reveals regular rate and rhythm.  No obvious murmurs or rubs noted. EXTREMITIES:  Without significant edema. NEUROLOGIC:  Cranial nerves as above.  Facial symmetry is not present. The patient has depression in the right nasolabial fold.  Extraocular movements are full.  Visual fields are full.  Speech is minimally dysarthric.  No aphasia noted.  Motor test reveals 5/5 strength in all 4s.  Good symmetric motor tone is noted throughout.  Sensory testing is intact to pinprick, soft touch. Vibratory sensation throughout.  The patient has good finger-nose-finger, heel-to-shin bilaterally.  Gait was not tested.  Deep tendon reflexes symmetric and normal.  Toes are downgoing bilaterally.  No evidence of extinction is noted.  LABORATORY VALUES:  Notable for white count 7.6,  hemoglobin of 13.5, hematocrit 40.0, MCV of 83.3, platelets of 213.  INR of 1.05.  Sodium 144, potassium 3.3, chloride of 105, CO2 of 28, glucose of 219, BUN of 24, creatinine of 1.51, total bili 0.5, alk phosphatase of 91, SGOT of 19, SGPT of 18, total protein 8.0, albumin of 3.9, calcium 10.1.  CK 96, MB fraction 1.3, troponin I less than 0.3.  CT of head is as above.  IMPRESSION: 1. Transient speech arrest, slurred speech, possible transient     ischemic attack. 2. Cerebrovascular disease. 3. Organic brain syndrome.  This patient may have had a TIA event tonight.  Need to rule out seizure- type event given the speech arrest, which may be due to seizures as well.  The patient will be admitted for brief evaluation.  PLAN: 1. Admission to [facility] monitor bed. 2. MRI of the brain. 3. MRI angiogram of the head. 4. Carotid Doppler study. 5. A 2-D echocardiogram. 6. EEG study.  We will follow patient's clinical course while in-house.     Marlan Palau, [name]     CKW/MEDQ  D:  10/01/2010  T:  10/02/2010  Job:  [id number]  cc:   Valetta Mole. Swords, MD  Electronically Signed by Thana Farr [name] on 10/07/2010 09:31:41 AM

## 2010-10-10 DIAGNOSIS — J449 Chronic obstructive pulmonary disease, unspecified: Secondary | ICD-10-CM

## 2010-10-11 ENCOUNTER — Ambulatory Visit (INDEPENDENT_AMBULATORY_CARE_PROVIDER_SITE_OTHER): Payer: Medicare Other | Admitting: Internal Medicine

## 2010-10-11 ENCOUNTER — Encounter: Payer: Self-pay | Admitting: Internal Medicine

## 2010-10-11 DIAGNOSIS — R609 Edema, unspecified: Secondary | ICD-10-CM

## 2010-10-11 DIAGNOSIS — R600 Localized edema: Secondary | ICD-10-CM

## 2010-10-11 DIAGNOSIS — N181 Chronic kidney disease, stage 1: Secondary | ICD-10-CM

## 2010-10-11 DIAGNOSIS — Z8679 Personal history of other diseases of the circulatory system: Secondary | ICD-10-CM

## 2010-10-11 LAB — BASIC METABOLIC PANEL
BUN: 20 mg/dL (ref 6–23)
Calcium: 9.9 mg/dL (ref 8.4–10.5)
GFR: 84.66 mL/min (ref >60.00)
Glucose, Bld: 114 mg/dL — ABNORMAL HIGH (ref 70–99)
Sodium: 146 mEq/L — ABNORMAL HIGH (ref 135–145)

## 2010-10-11 NOTE — Assessment & Plan Note (Signed)
May have had TIA Reviewed mri with patient and wife Continue risk factor modification

## 2010-10-11 NOTE — Progress Notes (Signed)
  Subjective:    Patient ID: Mike Williams, male    DOB: August 29, 1932, 77 y.o.   MRN: [id number]  HPI  Pt comes in for post hospital visit Presumptive DX of TIA---reviewed h and p , and MRI, and labs  Note hyperkalemia-   Patient's wife also notes edema mostly the right leg. No pain. No erythema.  Past Medical History  Diagnosis Date  . CEREBROVASCULAR ACCIDENT, HX OF 02/09/2007  . DEMENTIA 05/12/2007  . HYPERGLYCEMIA 09/21/2009  . HYPERTENSION 12/29/2006  . OBSTRUCTIVE SLEEP APNEA 12/29/2006  . PROSTATE CANCER, HX OF 12/29/2006  . Hemorrhoids   . Lactose intolerance   . Risk for falls    Past Surgical History  Procedure Date  . Cholecystectomy   . Prostate surgery     cryotherapy    reports that he has never smoked. He does not have any smokeless tobacco history on file. His alcohol and drug histories not on file. family history includes Cancer in his sister; Heart attack in his father; and Heart disease in his father. No Known Allergies   Review of Systems     patient denies chest pain, shortness of breath, orthopnea. Denies lower extremity edema, abdominal pain, change in appetite, change in bowel movements. Patient denies rashes, musculoskeletal complaints. No other specific complaints in a complete review of systems.   Objective:   Physical Exam Well-developed male no acute distress. HEENT exam atraumatic, normocephalic neck supple without lymphadenopathy or thyromegaly. Chest clear to auscultation cardiac exam S1 and S2 are regular. Abdominal exam active bowel sounds, soft. Extremities 1+ edema to the midcalf bilaterally. Neurologic exam he is frequently confused. Gait is broad-based.       Assessment & Plan:

## 2010-10-11 NOTE — Assessment & Plan Note (Signed)
Recheck creatinine today 

## 2010-10-11 NOTE — Assessment & Plan Note (Signed)
i doubt edema is significant Check ddimer

## 2010-10-14 ENCOUNTER — Ambulatory Visit (HOSPITAL_COMMUNITY)
Admission: RE | Admit: 2010-10-14 | Discharge: 2010-10-14 | Disposition: A | Discharge status: Home / Self Care | Payer: Medicare Other | Source: Ambulatory Visit | Attending: Internal Medicine | Admitting: Internal Medicine

## 2010-10-14 ENCOUNTER — Other Ambulatory Visit: Payer: Self-pay | Admitting: Internal Medicine

## 2010-10-14 DIAGNOSIS — M7989 Other specified soft tissue disorders: Secondary | ICD-10-CM | POA: Insufficient documentation

## 2010-10-14 DIAGNOSIS — R600 Localized edema: Secondary | ICD-10-CM

## 2010-10-14 DIAGNOSIS — R7989 Other specified abnormal findings of blood chemistry: Secondary | ICD-10-CM

## 2010-10-18 ENCOUNTER — Telehealth: Payer: Self-pay | Admitting: *Deleted

## 2010-10-18 NOTE — Telephone Encounter (Signed)
PT from [place] took pt's BP this am and it was 178/108, but he had not taken his BP meds. He willl take meds and rest and monitor BP.  To call if it remains high.

## 2010-10-23 ENCOUNTER — Other Ambulatory Visit: Payer: Self-pay | Admitting: Internal Medicine

## 2010-10-24 NOTE — Telephone Encounter (Signed)
Dr Swords notified. 

## 2010-11-20 NOTE — Discharge Summary (Signed)
Mike Williams, Mike Williams NO.:  [id number]  MEDICAL RECORD NO.:  [id number]  LOCATION:  6737                         FACILITY:  [facility]  PHYSICIAN:  Marlan Palau, [name]  DATE OF BIRTH:  04/12/1933  DATE OF ADMISSION:  10/01/2010 DATE OF DISCHARGE:  10/04/2010                              DISCHARGE SUMMARY   DIAGNOSES AT TIME OF DISCHARGE: 1. Transient dysarthria, resolved. 2. Left facial hemispasms. 3. Organic brain syndrome. 4. Prostatitis. 5. Hypertension. 6. Cerebrovascular disease. 7. Prostate cancer. 8. Gallbladder dissection. 9. Ganglion cyst resection from the wrist.  MEDICINES AT TIME OF DISCHARGE: 1. Benicar/hydrochlorothiazide 40/12.5 mg q.a.m. 2. Felodipine 5 mg a day. 3. Lisinopril 40 mg a day. 4. Aspirin 325 mg a day. 5. Plavix 75 mg a day.  STUDIES PERFORMED: 1. CT of the brain on admission shows no acute abnormality.  Moderate     cortical volume loss and diffuse small vessel ischemic    microangiopathy.  Small chronic lacunar infarcts in the basal     ganglia and chronic ischemic changes at the left temporal lobe.     Mucous retention cyst or polyps again noted in the right frontal     sinuses. 2. MRI of the brain shows no acute infarct.  Remote infarcts basal     ganglia, thalami and right aspect of pons.  Global atrophy without     hydrocephalus.  Prominent small vessel disease type changes.  Mild     spinal stenosis C3-4 and C4-5.  Abnormal appearance left vertebral     artery.  Polypoid opacification right frontal sinus.  Mucosal     thickening ethmoid sinus air cells and maxillary sinuses. 3. MRA of the head shows intracranial atherosclerotic type changes     most notable involving posterior circulation of branch vessels as     discussed above. 4. A 2-D echocardiogram shows EF of 55-60% with no obvious source of     embolus.  Carotid Doppler shows no ICA stenosis. 5. EKG shows sinus bradycardia with first-degree AV block,  otherwise     normal.  No significant change since last tracing. 6. EEG shows no seizure.  LABORATORY STUDIES:  Hemoglobin A1c 5.7.  Cholesterol 149, triglycerides 84, HDL 50, LDL 82.  CBC normal.  Differential normal.  Coagulations normal.  Potassium 3.3, glucose 219, BUN 24, creatinine 1.51.  Cardiac enzymes negative.  Liver function tests normal.  HISTORY OF PRESENT ILLNESS:  Mr. Aedyn Kempfer is a 77 year old right- handed African American male with a history of hypertension and cerebrovascular disease.  The patient had an event at home witnessed by his wife.  The patient had sudden onset of speech arrest at 9:45 p.m.. The patient was pointing to his mouth appeared to be diaphoretic and had new onset of right facial droop.  The speech arrest lasted only for a few minutes and then he began to be able to talk.  The speech was slurred.  The patient has no obvious weakness of the arms or legs.  He was brought by EMS to the [facility] Emergency Room where he was noted to have some mild right facial droop, but otherwise, normal  speech.  CT of the brain showed no acute changes, but bifrontal white matter changes were seen.  The patient has chronic infarct in the basal ganglia area and medial left temporal lobe.  The patient was seen as a code stroke. His NIH stroke scale was 3.  He was not felt to be a tPA candidate secondary to minimal deficits and quick resolution.  Of note, the patient does have a history of dementia.  He was admitted to the hospital for further stroke evaluation.  HOSPITAL COURSE:  MRI performed which showed no acute stroke.  In hospital, the patient was noted to have left facial hemispasms.  This facial spasm likely lead to the wife feeling as if he had facial droop. It is not felt the patient had a TIA.  The patient was on aspirin and Plavix prior to admission.  These will be continued for ongoing secondary stroke prevention.  He was evaluated by PT, OT and  Speech Therapy in hospital.  PT and OT recommended Home Health follow up for safety concerns and ongoing therapy needs.  Wife is wiling to provide care and the patient will be discharged home.  CONDITION AT DISCHARGE:  The patient was alert and oriented to person, place and immediate situation.  Speech clear.  New left facial asymmetry from left hemifacial spasms, which are chronic per wife.  Moves all 4 extremities x4.  Heart rate regular.  Breath sounds clear.  DISCHARGE/PLAN: 1. Discharge home with wife. 2. Continue aspirin and Plavix for secondary stroke prevention. 3. Ongoing risk factor control. 4. Home Health PT and OT. 5. Follow up primary care physician in 1 month. 6. Follow up Dr. Anne Hahn as needed.     Annie Main, N.P.   ______________________________ C. Lesia Sago, [name]    SB/MEDQ  D:  10/04/2010  T:  10/05/2010  Job:  [id number]  cc:   Pramod P. Pearlean Brownie, MD Valetta Mole Swords, MD C. Lesia Sago, [name]  Electronically Signed by Annie Main N.P. on 10/24/2010 02:29:34 PM Electronically Signed by Thana Farr [name] on 11/20/2010 08:21:58 AM

## 2011-01-22 ENCOUNTER — Encounter: Payer: Self-pay | Admitting: Internal Medicine

## 2011-01-22 ENCOUNTER — Ambulatory Visit (INDEPENDENT_AMBULATORY_CARE_PROVIDER_SITE_OTHER): Payer: Medicare Other | Admitting: Internal Medicine

## 2011-01-22 DIAGNOSIS — F068 Other specified mental disorders due to known physiological condition: Secondary | ICD-10-CM

## 2011-01-22 DIAGNOSIS — I10 Essential (primary) hypertension: Secondary | ICD-10-CM

## 2011-01-22 DIAGNOSIS — Z23 Encounter for immunization: Secondary | ICD-10-CM

## 2011-01-22 DIAGNOSIS — Z8679 Personal history of other diseases of the circulatory system: Secondary | ICD-10-CM

## 2011-01-22 NOTE — Progress Notes (Signed)
  Subjective:    Patient ID: Mike Williams, male    DOB: 1932-06-26, 77 y.o.   MRN: [id number]  HPI  Dementia---stable the past several months  Stroke---hx. Wife reports an acute episode of confusion last night---resolved  Bladder---much better with enablex  htn---tolerating meds---rare LE edema (on felodipine)  Past Medical History  Diagnosis Date  . CEREBROVASCULAR ACCIDENT, HX OF 02/09/2007  . DEMENTIA 05/12/2007  . HYPERGLYCEMIA 09/21/2009  . HYPERTENSION 12/29/2006  . OBSTRUCTIVE SLEEP APNEA 12/29/2006  . PROSTATE CANCER, HX OF 12/29/2006  . Hemorrhoids   . Lactose intolerance   . Risk for falls    Past Surgical History  Procedure Date  . Cholecystectomy   . Prostate surgery     cryotherapy    reports that he has never smoked. He does not have any smokeless tobacco history on file. His alcohol and drug histories not on file. family history includes Cancer in his sister; Heart attack in his father; and Heart disease in his father. No Known Allergies   Review of Systems  patient denies chest pain, shortness of breath, orthopnea. Denies lower extremity edema, abdominal pain, change in appetite, change in bowel movements. Patient denies rashes, musculoskeletal complaints. No other specific complaints in a complete review of systems.      Objective:   Physical Exam   well-developed well-nourished male in no acute distress. HEENT exam atraumatic, normocephalic, neck supple without jugular venous distention. Chest clear to auscultation cardiac exam S1-S2 are regular. Abdominal exam overweight with bowel sounds, soft and nontender. Extremities no edema. Neurologic exam is alert, confused    Assessment & Plan:  Form completion

## 2011-01-29 NOTE — Assessment & Plan Note (Signed)
BP Readings from Last 3 Encounters:  01/22/11 144/100  10/11/10 150/86  08/20/10 162/104   Repeat bp 136/75 Continue same meds

## 2011-01-29 NOTE — Assessment & Plan Note (Signed)
Sxs from last night have resolved Wife will call for any convenience

## 2011-01-29 NOTE — Assessment & Plan Note (Signed)
Chronic, progressive sxs Continue same meds

## 2011-02-24 ENCOUNTER — Other Ambulatory Visit: Payer: Self-pay | Admitting: Internal Medicine

## 2011-04-25 ENCOUNTER — Other Ambulatory Visit: Payer: Self-pay | Admitting: Internal Medicine

## 2011-07-01 ENCOUNTER — Emergency Department (HOSPITAL_COMMUNITY): Payer: Medicare Other

## 2011-07-01 ENCOUNTER — Encounter (HOSPITAL_COMMUNITY): Payer: Self-pay | Admitting: Radiology

## 2011-07-01 ENCOUNTER — Inpatient Hospital Stay (HOSPITAL_COMMUNITY)
Admission: EM | Admit: 2011-07-01 | Discharge: 2011-07-05 | DRG: 312 | Disposition: A | Discharge status: Home / Self Care | Payer: Medicare Other | Source: Ambulatory Visit | Attending: Family Medicine | Admitting: Family Medicine

## 2011-07-01 DIAGNOSIS — R55 Syncope and collapse: Secondary | ICD-10-CM | POA: Diagnosis not present

## 2011-07-01 DIAGNOSIS — I129 Hypertensive chronic kidney disease with stage 1 through stage 4 chronic kidney disease, or unspecified chronic kidney disease: Secondary | ICD-10-CM | POA: Diagnosis present

## 2011-07-01 DIAGNOSIS — T441X5A Adverse effect of other parasympathomimetics [cholinergics], initial encounter: Secondary | ICD-10-CM | POA: Diagnosis present

## 2011-07-01 DIAGNOSIS — R001 Bradycardia, unspecified: Secondary | ICD-10-CM | POA: Diagnosis not present

## 2011-07-01 DIAGNOSIS — I10 Essential (primary) hypertension: Secondary | ICD-10-CM

## 2011-07-01 DIAGNOSIS — Z8673 Personal history of transient ischemic attack (TIA), and cerebral infarction without residual deficits: Secondary | ICD-10-CM

## 2011-07-01 DIAGNOSIS — N2889 Other specified disorders of kidney and ureter: Secondary | ICD-10-CM

## 2011-07-01 DIAGNOSIS — N390 Urinary tract infection, site not specified: Secondary | ICD-10-CM | POA: Diagnosis not present

## 2011-07-01 DIAGNOSIS — Z8546 Personal history of malignant neoplasm of prostate: Secondary | ICD-10-CM

## 2011-07-01 DIAGNOSIS — Z7902 Long term (current) use of antithrombotics/antiplatelets: Secondary | ICD-10-CM

## 2011-07-01 DIAGNOSIS — E870 Hyperosmolality and hypernatremia: Secondary | ICD-10-CM | POA: Diagnosis present

## 2011-07-01 DIAGNOSIS — I498 Other specified cardiac arrhythmias: Secondary | ICD-10-CM | POA: Diagnosis not present

## 2011-07-01 DIAGNOSIS — N181 Chronic kidney disease, stage 1: Secondary | ICD-10-CM | POA: Diagnosis not present

## 2011-07-01 DIAGNOSIS — F0390 Unspecified dementia without behavioral disturbance: Secondary | ICD-10-CM | POA: Diagnosis not present

## 2011-07-01 DIAGNOSIS — F068 Other specified mental disorders due to known physiological condition: Secondary | ICD-10-CM

## 2011-07-01 DIAGNOSIS — Z7901 Long term (current) use of anticoagulants: Secondary | ICD-10-CM

## 2011-07-01 DIAGNOSIS — Z8679 Personal history of other diseases of the circulatory system: Secondary | ICD-10-CM

## 2011-07-01 DIAGNOSIS — F0280 Dementia in other diseases classified elsewhere without behavioral disturbance: Secondary | ICD-10-CM | POA: Diagnosis present

## 2011-07-01 DIAGNOSIS — R7309 Other abnormal glucose: Secondary | ICD-10-CM

## 2011-07-01 DIAGNOSIS — G319 Degenerative disease of nervous system, unspecified: Secondary | ICD-10-CM | POA: Diagnosis not present

## 2011-07-01 DIAGNOSIS — I351 Nonrheumatic aortic (valve) insufficiency: Secondary | ICD-10-CM | POA: Diagnosis present

## 2011-07-01 DIAGNOSIS — Z7982 Long term (current) use of aspirin: Secondary | ICD-10-CM

## 2011-07-01 DIAGNOSIS — R404 Transient alteration of awareness: Secondary | ICD-10-CM | POA: Diagnosis not present

## 2011-07-01 DIAGNOSIS — R569 Unspecified convulsions: Secondary | ICD-10-CM | POA: Diagnosis present

## 2011-07-01 DIAGNOSIS — E785 Hyperlipidemia, unspecified: Secondary | ICD-10-CM | POA: Diagnosis present

## 2011-07-01 DIAGNOSIS — G4733 Obstructive sleep apnea (adult) (pediatric): Secondary | ICD-10-CM

## 2011-07-01 DIAGNOSIS — I359 Nonrheumatic aortic valve disorder, unspecified: Secondary | ICD-10-CM | POA: Diagnosis present

## 2011-07-01 DIAGNOSIS — I6789 Other cerebrovascular disease: Secondary | ICD-10-CM | POA: Diagnosis not present

## 2011-07-01 HISTORY — DX: Inflammatory disease of prostate, unspecified: N41.9

## 2011-07-01 HISTORY — DX: Thoracic aortic ectasia: I77.810

## 2011-07-01 LAB — URINALYSIS, ROUTINE W REFLEX MICROSCOPIC
Ketones, ur: NEGATIVE mg/dL
Nitrite: NEGATIVE
Specific Gravity, Urine: 1.027 (ref 1.005–1.030)
Urobilinogen, UA: 1 mg/dL (ref 0.0–1.0)
pH: 6 (ref 5.0–8.0)

## 2011-07-01 LAB — CBC
Hemoglobin: 14.1 g/dL (ref 13.0–17.0)
MCHC: 32.6 g/dL (ref 30.0–36.0)
Platelets: 195 10*3/uL (ref 150–400)
RDW: 13.8 % (ref 11.5–15.5)

## 2011-07-01 LAB — GLUCOSE, CAPILLARY

## 2011-07-01 LAB — URINE MICROSCOPIC-ADD ON

## 2011-07-01 LAB — DIFFERENTIAL
Basophils Absolute: 0 10*3/uL (ref 0.0–0.1)
Basophils Relative: 0 % (ref 0–1)
Neutro Abs: 6.5 10*3/uL (ref 1.7–7.7)
Neutrophils Relative %: 78 % — ABNORMAL HIGH (ref 43–77)

## 2011-07-01 LAB — BASIC METABOLIC PANEL
Chloride: 107 mEq/L (ref 96–112)
GFR calc Af Amer: 56 mL/min — ABNORMAL LOW (ref >90)
Potassium: 4.1 mEq/L (ref 3.5–5.1)

## 2011-07-01 MED ORDER — DEXTROSE 5 % IV SOLN
1.0000 g | Freq: Once | INTRAVENOUS | Status: AC
  Administered 2011-07-01: 1 g via INTRAVENOUS
  Filled 2011-07-01: qty 10

## 2011-07-01 NOTE — ED Provider Notes (Signed)
History     CSN: [id number]  Arrival date & time 07/01/11  1918   First MD Initiated Contact with Patient 07/01/11 1929      Chief Complaint  Patient presents with  . Loss of Consciousness    (Consider location/radiation/quality/duration/timing/severity/associated sxs/prior treatment) HPI Comments: Spouse reports that at approximately 6:15 PM today patient had a syncopal episode while washing dishes.  Wife reports that she saw his eyes roll back in his head.  She was able to lower the patient to the ground.  He did not hit his head.  She reports that the episode lasted approximately 15 seconds and then he was back to baseline.  Wife reports that patient has a history of TIA and dementia.  No known cardiac disease.  Review of the chart shows a history of a CVA in 2008.  Wife states that patient has some left sided weakness at baseline and some drooping of his mouth.  Wife reports that she did not notice any additional weakness or changes in speech associated with this syncopal event.  Patient is denying any pain at this time.  Due to patient's history of dementia, unable to obtain a good history from patient.  Therefore, most of the history was obtained through patient's wife.    The history is provided by the patient and the spouse.    Past Medical History  Diagnosis Date  . CEREBROVASCULAR ACCIDENT, HX OF 02/09/2007  . DEMENTIA 05/12/2007  . HYPERGLYCEMIA 09/21/2009  . HYPERTENSION 12/29/2006  . OBSTRUCTIVE SLEEP APNEA 12/29/2006  . PROSTATE CANCER, HX OF 12/29/2006  . Hemorrhoids   . Lactose intolerance   . Risk for falls     Past Surgical History  Procedure Date  . Cholecystectomy   . Prostate surgery     cryotherapy    Family History  Problem Relation Age of Onset  . Heart attack Father   . Heart disease Father   . Cancer Sister     History  Substance Use Topics  . Smoking status: Never Smoker   . Smokeless tobacco: Not on file  . Alcohol Use:       Review of  Systems  Constitutional: Negative for fever and chills.  Genitourinary: Negative for hematuria.  Neurological: Positive for syncope.    Allergies  Review of patient's allergies indicates no known allergies.  Home Medications   Current Outpatient Rx  Name Route Sig Dispense Refill  . ASPIRIN EC 325 MG PO TBEC Oral Take 325 mg by mouth daily.    Marland Kitchen CLOPIDOGREL BISULFATE 75 MG PO TABS Oral Take 75 mg by mouth daily.     Marland Kitchen DARIFENACIN HYDROBROMIDE ER 7.5 MG PO TB24 Oral Take 7.5 mg by mouth daily.      Marland Kitchen FELODIPINE ER 5 MG PO TB24 Oral Take 5 mg by mouth daily.     Marland Kitchen OLMESARTAN MEDOXOMIL-HCTZ 40-12.5 MG PO TABS Oral Take 1 tablet by mouth daily.     Marland Kitchen RIVASTIGMINE 4.6 MG/24HR TD PT24 Transdermal Place 1 patch onto the skin every morning.     Marland Kitchen SERTRALINE HCL 100 MG PO TABS Oral Take 50 mg by mouth at bedtime.      BP 145/79  Pulse 80  Temp(Src) 97.5 F (36.4 C) (Oral)  Resp 12  Ht 5\' 7"  (1.702 m)  Wt 185 lb (83.915 kg)  BMI 28.97 kg/m2  SpO2 97%  Physical Exam  Nursing note and vitals reviewed. Constitutional: He appears well-developed and well-nourished. No distress.  HENT:  Head: Normocephalic and atraumatic.  Mouth/Throat: Oropharynx is clear and moist.  Eyes: EOM are normal. Pupils are equal, round, and reactive to light.  Neck: Normal range of motion. Neck supple.  Cardiovascular: Normal rate, regular rhythm and normal heart sounds.   Pulmonary/Chest: Effort normal and breath sounds normal. No respiratory distress. He has no wheezes.  Abdominal: Soft. Bowel sounds are normal. He exhibits no distension. There is no tenderness.  Musculoskeletal: Normal range of motion.  Neurological: He is alert. He has normal reflexes. No cranial nerve deficit or sensory deficit.       Patient orientated to place and person, but not time.  Wife reports this is baseline. Muscle strength 3/5 left UE and LE Muscle strength 5/5 right UE and right LE Distal sensation intact.  Skin: Skin is  warm and dry. He is not diaphoretic. No erythema.  Psychiatric: He has a normal mood and affect.    ED Course  Procedures (including critical care time)   Labs Reviewed  CBC  DIFFERENTIAL  BASIC METABOLIC PANEL  URINALYSIS, ROUTINE W REFLEX MICROSCOPIC   No results found.   No diagnosis found.  Patient discussed with [facility].  Patient will be admitted to inpatient telemetry for further syncope workup.  Patient stable at this time.    MDM  Patients labs unremarkable.  Troponin negative.  EKG does not show any significant changes.  Head CT does not show any acute changes.  Patient with history of TIA and CVA.  Neurological exam appears to be at baseline.  Patient admitted to the hospital for monitoring and         Magnus Sinning, PA-C 07/01/11 2320

## 2011-07-01 NOTE — ED Notes (Signed)
The patient's spouse witnessed a syncopal episode @ 1815.  Per EMS, the patient was not postictal.  Per EMS, the patient's significant other stated that he has a hx of dementia and all mentation deficits are consistent with his baseline.

## 2011-07-01 NOTE — ED Notes (Signed)
Patient transported to CT 

## 2011-07-01 NOTE — ED Provider Notes (Addendum)
Medical screening examination/treatment/procedure(s) were conducted as a shared visit with non-physician practitioner(s) and myself.  I personally evaluated the patient during the encounter  Pt s/p syncopal episode.  Pt without complaints at this time.  Alert.  No distress.  Will proceed with syncopal, TIA, workup.   Celene Kras, MD 07/01/11 2034  Date: 07/01/2011  Rate: 84  Rhythm: normal sinus rhythm  QRS Axis: normal  Intervals: normal  ST/T Wave abnormalities: normal  Conduction Disutrbances:none except first degree AV block  Narrative Interpretation: Q waves noted inferiorly  Old EKG Reviewed: unchanged    Celene Kras, MD 07/02/11 [phone]

## 2011-07-01 NOTE — H&P (Signed)
Mike Williams is an 78 y.o. male.   Chief Complaint: Passing out HPI: 78 yo with history of HTN and CVA x2 here after an episode of passing out tonight. He was in his Kitchen washing dishes when wife found him shaking and about to fall down. She quickly held him and laid him down. He lost consciousness for about 10 minutes. No NV, no fever. He is now back to his baseline. Denies focal weakness. No prior history of seizure disorder. Per wife, similar episode when he had his strokes.   Past Medical History  Diagnosis Date  . CEREBROVASCULAR ACCIDENT, HX OF 02/09/2007  . DEMENTIA 05/12/2007  . HYPERGLYCEMIA 09/21/2009  . HYPERTENSION 12/29/2006  . OBSTRUCTIVE SLEEP APNEA 12/29/2006  . PROSTATE CANCER, HX OF 12/29/2006  . Hemorrhoids   . Lactose intolerance   . Risk for falls     Past Surgical History  Procedure Date  . Cholecystectomy   . Prostate surgery     cryotherapy    Family History  Problem Relation Age of Onset  . Heart attack Father   . Heart disease Father   . Cancer Sister    Social History:  reports that he has never smoked. He does not have any smokeless tobacco history on file. His alcohol and drug histories not on file.  Allergies: No Known Allergies  Medications Prior to Admission  Medication Dose Route Frequency Provider Last Rate Last Dose  . cefTRIAXone (ROCEPHIN) 1 g in dextrose 5 % 50 mL IVPB  1 g Intravenous Once [employer], PA-C   1 g at 07/01/11 2250   Medications Prior to Admission  Medication Sig Dispense Refill  . clopidogrel (PLAVIX) 75 MG tablet Take 75 mg by mouth daily.       Marland Kitchen darifenacin (ENABLEX) 7.5 MG 24 hr tablet Take 7.5 mg by mouth daily.        . felodipine (PLENDIL) 5 MG 24 hr tablet Take 5 mg by mouth daily.       Marland Kitchen olmesartan-hydrochlorothiazide (BENICAR HCT) 40-12.5 MG per tablet Take 1 tablet by mouth daily.       . rivastigmine (EXELON) 4.6 mg/24hr Place 1 patch onto the skin every morning.         Results for orders placed  during the hospital encounter of 07/01/11 (from the past 48 hour(s))  GLUCOSE, CAPILLARY     Status: Abnormal   Collection Time   07/01/11  8:09 PM      Component Value Range Comment   Glucose-Capillary 181 (*) 70 - 99 (mg/dL)    Comment 1 Documented in Chart      Comment 2 Notify RN     CBC     Status: Normal   Collection Time   07/01/11  8:10 PM      Component Value Range Comment   WBC 8.3  4.0 - 10.5 (K/uL)    RBC 5.06  4.22 - 5.81 (MIL/uL)    Hemoglobin 14.1  13.0 - 17.0 (g/dL)    HCT 96.0  45.4 - 09.8 (%)    MCV 85.4  78.0 - 100.0 (fL)    MCH 27.9  26.0 - 34.0 (pg)    MCHC 32.6  30.0 - 36.0 (g/dL)    RDW 11.9  14.7 - 82.9 (%)    Platelets 195  150 - 400 (K/uL)   DIFFERENTIAL     Status: Abnormal   Collection Time   07/01/11  8:10 PM  Component Value Range Comment   Neutrophils Relative 78 (*) 43 - 77 (%)    Neutro Abs 6.5  1.7 - 7.7 (K/uL)    Lymphocytes Relative 15  12 - 46 (%)    Lymphs Abs 1.3  0.7 - 4.0 (K/uL)    Monocytes Relative 6  3 - 12 (%)    Monocytes Absolute 0.5  0.1 - 1.0 (K/uL)    Eosinophils Relative 0  0 - 5 (%)    Eosinophils Absolute 0.0  0.0 - 0.7 (K/uL)    Basophils Relative 0  0 - 1 (%)    Basophils Absolute 0.0  0.0 - 0.1 (K/uL)   BASIC METABOLIC PANEL     Status: Abnormal   Collection Time   07/01/11  8:10 PM      Component Value Range Comment   Sodium 146 (*) 135 - 145 (mEq/L)    Potassium 4.1  3.5 - 5.1 (mEq/L)    Chloride 107  96 - 112 (mEq/L)    CO2 31  19 - 32 (mEq/L)    Glucose, Bld 170 (*) 70 - 99 (mg/dL)    BUN 23  6 - 23 (mg/dL)    Creatinine, Ser 1.61 (*) 0.50 - 1.35 (mg/dL)    Calcium 9.9  8.4 - 10.5 (mg/dL)    GFR calc non Af Amer 48 (*) >90 (mL/min)    GFR calc Af Amer 56 (*) >90 (mL/min)   POCT I-STAT TROPONIN I     Status: Normal   Collection Time   07/01/11  8:27 PM      Component Value Range Comment   Troponin i, poc 0.00  0.00 - 0.08 (ng/mL)    Comment 3            URINALYSIS, ROUTINE W REFLEX MICROSCOPIC     Status:  Abnormal   Collection Time   07/01/11  9:13 PM      Component Value Range Comment   Color, Urine YELLOW  YELLOW     APPearance CLOUDY (*) CLEAR     Specific Gravity, Urine 1.027  1.005 - 1.030     pH 6.0  5.0 - 8.0     Glucose, UA NEGATIVE  NEGATIVE (mg/dL)    Hgb urine dipstick SMALL (*) NEGATIVE     Bilirubin Urine NEGATIVE  NEGATIVE     Ketones, ur NEGATIVE  NEGATIVE (mg/dL)    Protein, ur NEGATIVE  NEGATIVE (mg/dL)    Urobilinogen, UA 1.0  0.0 - 1.0 (mg/dL)    Nitrite NEGATIVE  NEGATIVE     Leukocytes, UA MODERATE (*) NEGATIVE    URINE MICROSCOPIC-ADD ON     Status: Abnormal   Collection Time   07/01/11  9:13 PM      Component Value Range Comment   Squamous Epithelial / LPF RARE  RARE     WBC, UA 3-6  <3 (WBC/hpf)    RBC / HPF 3-6  <3 (RBC/hpf)    Casts HYALINE CASTS (*) NEGATIVE     Urine-Other MUCOUS PRESENT      Ct Head Wo Contrast  07/01/2011  *RADIOLOGY REPORT*  Clinical Data: Syncopal episode, history of dementia  CT HEAD WITHOUT CONTRAST  Technique:  Contiguous axial images were obtained from the base of the skull through the vertex without contrast.  Comparison: 10/01/2010; 12/07/2009; brain MRI - 10/03/2010  Findings:  Redemonstrated diffuse atrophy.  Extensive periventricular hypodensities compatible microvascular ischemic disease.  Given background parenchymal abnormalities, there is no discrete evidence  of acute large territory infarct. Grossly unchanged lacunar infarcts within the bilateral basal ganglia.  No interparenchymal or extra-axial mass or hemorrhage.  Unchanged size and configuration of the ventricles and basilar cisterns.  Polypoid mucosal thickening within the right frontal sinus.  Remaining paranasal sinuses mastoid air cells are otherwise normal.  Regional soft tissues are normal.  IMPRESSION: 1.  Grossly unchanged atrophy and extensive microvascular ischemic disease without definite acute intracranial process. 2.  Polypoid mucosal thickening within the right  frontal sinus. No air fluid levels.  Original Report Authenticated By: Waynard Reeds, [name]    Review of Systems  Neurological: Positive for loss of consciousness and weakness.  All other systems reviewed and are negative.    Blood pressure 157/92, pulse 68, temperature 97.5 F (36.4 C), temperature source Oral, resp. rate 14, height 5\' 7"  (1.702 m), weight 83.915 kg (185 lb), SpO2 100.00%. Physical Exam  Constitutional: He is oriented to person, place, and time. He appears well-developed and well-nourished.  HENT:  Head: Normocephalic and atraumatic.  Right Ear: External ear normal.  Left Ear: External ear normal.  Nose: Nose normal.  Mouth/Throat: Oropharynx is clear and moist.  Eyes: Conjunctivae and EOM are normal. Pupils are equal, round, and reactive to light.  Neck: Normal range of motion. Neck supple.  Cardiovascular: Normal rate, regular rhythm, normal heart sounds and intact distal pulses.   Respiratory: Effort normal and breath sounds normal.  GI: Soft. Bowel sounds are normal.  Musculoskeletal: Normal range of motion.  Neurological: He is alert and oriented to person, place, and time. He has normal reflexes.  Skin: Skin is warm and dry.  Psychiatric: He has a normal mood and affect. His behavior is normal.     Assessment/Plan 1. Syncope: The cause is unclear but could be another TIA, Arrythmias, Seizure or vasovagal. His Blood Pressure and CBG were normal per EMS. Will admit to Tele and work him up with echo, MRI/MRa brain, carotid dopplers and check his Lipid panel. 2. HTN: Continue home medications. 3. CKDI: At baseline 4. Dementia: Stable   Aleigh Grunden,LAWAL 07/01/2011, 11:44 PM

## 2011-07-01 NOTE — ED Notes (Signed)
CBG 181.  

## 2011-07-02 ENCOUNTER — Inpatient Hospital Stay (HOSPITAL_COMMUNITY): Payer: Medicare Other

## 2011-07-02 DIAGNOSIS — R55 Syncope and collapse: Secondary | ICD-10-CM | POA: Diagnosis not present

## 2011-07-02 DIAGNOSIS — I6789 Other cerebrovascular disease: Secondary | ICD-10-CM | POA: Diagnosis not present

## 2011-07-02 DIAGNOSIS — N181 Chronic kidney disease, stage 1: Secondary | ICD-10-CM | POA: Diagnosis not present

## 2011-07-02 DIAGNOSIS — F0390 Unspecified dementia without behavioral disturbance: Secondary | ICD-10-CM | POA: Diagnosis not present

## 2011-07-02 DIAGNOSIS — I6330 Cerebral infarction due to thrombosis of unspecified cerebral artery: Secondary | ICD-10-CM | POA: Diagnosis not present

## 2011-07-02 DIAGNOSIS — I10 Essential (primary) hypertension: Secondary | ICD-10-CM | POA: Diagnosis not present

## 2011-07-02 DIAGNOSIS — G40309 Generalized idiopathic epilepsy and epileptic syndromes, not intractable, without status epilepticus: Secondary | ICD-10-CM | POA: Diagnosis not present

## 2011-07-02 DIAGNOSIS — I359 Nonrheumatic aortic valve disorder, unspecified: Secondary | ICD-10-CM

## 2011-07-02 LAB — CARDIAC PANEL(CRET KIN+CKTOT+MB+TROPI)
CK, MB: 2 ng/mL (ref 0.3–4.0)
CK, MB: 2 ng/mL (ref 0.3–4.0)
Relative Index: 2 (ref 0.0–2.5)
Total CK: 117 U/L (ref 7–232)
Total CK: 110 U/L (ref 7–232)
Total CK: 101 U/L (ref 7–232)

## 2011-07-02 LAB — CBC
MCH: 28.3 pg (ref 26.0–34.0)
MCHC: 33.4 g/dL (ref 30.0–36.0)
MCV: 84.7 fL (ref 78.0–100.0)
Platelets: 190 10*3/uL (ref 150–400)
RDW: 14 % (ref 11.5–15.5)

## 2011-07-02 LAB — COMPREHENSIVE METABOLIC PANEL
AST: 28 U/L (ref 0–37)
Albumin: 3.1 g/dL — ABNORMAL LOW (ref 3.5–5.2)
Alkaline Phosphatase: 70 U/L (ref 39–117)
Chloride: 112 mEq/L (ref 96–112)
Potassium: 4.4 mEq/L (ref 3.5–5.1)
Total Bilirubin: 0.3 mg/dL (ref 0.3–1.2)

## 2011-07-02 LAB — LIPID PANEL: Total CHOL/HDL Ratio: 3.1 RATIO

## 2011-07-02 MED ORDER — FELODIPINE ER 5 MG PO TB24
5.0000 mg | ORAL_TABLET | Freq: Every day | ORAL | Status: DC
  Filled 2011-07-02 (×2): qty 1

## 2011-07-02 MED ORDER — OLMESARTAN MEDOXOMIL 40 MG PO TABS
40.0000 mg | ORAL_TABLET | Freq: Every day | ORAL | Status: DC
  Administered 2011-07-02 – 2011-07-05 (×4): 40 mg via ORAL
  Filled 2011-07-02 (×4): qty 1

## 2011-07-02 MED ORDER — OLMESARTAN MEDOXOMIL-HCTZ 40-12.5 MG PO TABS: 1.0000 | ORAL_TABLET | Freq: Every day | ORAL | Status: DC

## 2011-07-02 MED ORDER — HYDROCHLOROTHIAZIDE 12.5 MG PO CAPS
12.5000 mg | ORAL_CAPSULE | Freq: Every day | ORAL | Status: DC
  Administered 2011-07-02 – 2011-07-05 (×4): 12.5 mg via ORAL
  Filled 2011-07-02 (×4): qty 1

## 2011-07-02 MED ORDER — FELODIPINE ER 5 MG PO TB24
5.0000 mg | ORAL_TABLET | Freq: Every day | ORAL | Status: DC
  Administered 2011-07-02 – 2011-07-03 (×2): 5 mg via ORAL
  Filled 2011-07-02 (×2): qty 1

## 2011-07-02 MED ORDER — ONDANSETRON HCL 4 MG PO TABS: 4.0000 mg | ORAL_TABLET | Freq: Four times a day (QID) | ORAL | Status: DC | PRN

## 2011-07-02 MED ORDER — ENOXAPARIN SODIUM 40 MG/0.4ML ~~LOC~~ SOLN
40.0000 mg | SUBCUTANEOUS | Status: DC
  Administered 2011-07-02 – 2011-07-05 (×4): 40 mg via SUBCUTANEOUS
  Filled 2011-07-02 (×4): qty 0.4

## 2011-07-02 MED ORDER — DARIFENACIN HYDROBROMIDE ER 7.5 MG PO TB24
7.5000 mg | ORAL_TABLET | Freq: Every day | ORAL | Status: DC
  Administered 2011-07-02 – 2011-07-05 (×4): 7.5 mg via ORAL
  Filled 2011-07-02 (×4): qty 1

## 2011-07-02 MED ORDER — GADOBENATE DIMEGLUMINE 529 MG/ML IV SOLN
18.0000 mL | Freq: Once | INTRAVENOUS | Status: AC
  Administered 2011-07-02: 18 mL via INTRAVENOUS

## 2011-07-02 MED ORDER — RIVASTIGMINE 4.6 MG/24HR TD PT24
4.6000 mg | MEDICATED_PATCH | TRANSDERMAL | Status: DC
  Administered 2011-07-02 – 2011-07-04 (×3): 4.6 mg via TRANSDERMAL
  Filled 2011-07-02 (×4): qty 1

## 2011-07-02 MED ORDER — SODIUM CHLORIDE 0.9 % IJ SOLN
3.0000 mL | Freq: Two times a day (BID) | INTRAMUSCULAR | Status: DC
  Administered 2011-07-04: 3 mL via INTRAVENOUS

## 2011-07-02 MED ORDER — SERTRALINE HCL 50 MG PO TABS
50.0000 mg | ORAL_TABLET | Freq: Every day | ORAL | Status: DC
  Administered 2011-07-02 – 2011-07-04 (×3): 50 mg via ORAL
  Filled 2011-07-02 (×4): qty 1

## 2011-07-02 MED ORDER — SODIUM CHLORIDE 0.9 % IV SOLN
INTRAVENOUS | Status: DC
  Administered 2011-07-02 (×2): via INTRAVENOUS

## 2011-07-02 MED ORDER — ONDANSETRON HCL 4 MG/2ML IJ SOLN: 4.0000 mg | Freq: Four times a day (QID) | INTRAMUSCULAR | Status: DC | PRN

## 2011-07-02 MED ORDER — ASPIRIN EC 325 MG PO TBEC
325.0000 mg | DELAYED_RELEASE_TABLET | Freq: Every day | ORAL | Status: DC
  Administered 2011-07-02 – 2011-07-03 (×2): 325 mg via ORAL
  Filled 2011-07-02 (×3): qty 1

## 2011-07-02 MED ORDER — ACETAMINOPHEN 650 MG RE SUPP: 650.0000 mg | Freq: Four times a day (QID) | RECTAL | Status: DC | PRN

## 2011-07-02 MED ORDER — CLOPIDOGREL BISULFATE 75 MG PO TABS
75.0000 mg | ORAL_TABLET | Freq: Every day | ORAL | Status: DC
  Administered 2011-07-02 – 2011-07-05 (×4): 75 mg via ORAL
  Filled 2011-07-02 (×4): qty 1

## 2011-07-02 MED ORDER — DOCUSATE SODIUM 100 MG PO CAPS
100.0000 mg | ORAL_CAPSULE | Freq: Two times a day (BID) | ORAL | Status: DC
  Administered 2011-07-02 – 2011-07-05 (×7): 100 mg via ORAL
  Filled 2011-07-02 (×8): qty 1

## 2011-07-02 MED ORDER — SODIUM CHLORIDE 0.45 % IV SOLN
INTRAVENOUS | Status: DC
  Administered 2011-07-02 – 2011-07-03 (×2): via INTRAVENOUS
  Administered 2011-07-04: 20 mL/h via INTRAVENOUS

## 2011-07-02 MED ORDER — ACETAMINOPHEN 325 MG PO TABS: 650.0000 mg | ORAL_TABLET | Freq: Four times a day (QID) | ORAL | Status: DC | PRN

## 2011-07-02 NOTE — Progress Notes (Signed)
Subjective: Patient seen and examined, admitted with questionable syncope versus seizure. The past also he has had similar admissions, he follows Dr. Anne Hahn as outpatient  Objective: Vital signs in last 24 hours: Temp:  [97.5 F (36.4 C)-97.9 F (36.6 C)] 97.8 F (36.6 C) (02/27 1429) Pulse Rate:  [60-110] 60  (02/27 1429) Resp:  [12-22] 18  (02/27 1429) BP: (133-176)/(76-102) 155/78 mmHg (02/27 1429) SpO2:  [96 %-100 %] 98 % (02/27 1429) Weight:  [83.915 kg (185 lb)] 83.915 kg (185 lb) (02/26 1927) Weight change:  Last BM Date: 07/01/11  Intake/Output from previous day:       Physical Exam: HEENT: Atraumatic, normocephalic Neck: Supple Chest : Clear to auscultation bilaterally, no wheezing, no crackles Heart : S1 S2 Regular, no murmurs Abdomen: Soft, Nontender, no organomegaly Ext : No cyanosis, clubbing, edema   Lab Results: Basic Metabolic Panel:  Basename 07/02/11 0530 07/01/11 2010  NA 147* 146*  K 4.4 4.1  CL 112 107  CO2 25 31  GLUCOSE 112* 170*  BUN 25* 23  CREATININE 1.11 1.38*  CALCIUM 9.1 9.9  MG -- --  PHOS -- --   Liver Function Tests:  Basename 07/02/11 0530  AST 28  ALT 20  ALKPHOS 70  BILITOT 0.3  PROT 7.0  ALBUMIN 3.1*   No results found for this basename: LIPASE:2,AMYLASE:2 in the last 72 hours No results found for this basename: AMMONIA:2 in the last 72 hours CBC:  Basename 07/02/11 0530 07/01/11 2010  WBC 7.8 8.3  NEUTROABS -- 6.5  HGB 12.8* 14.1  HCT 38.3* 43.2  MCV 84.7 85.4  PLT 190 195   Cardiac Enzymes:  Basename 07/02/11 1601 07/02/11 0858 07/02/11 0014  CKTOTAL 101 110 117  CKMB 2.0 2.0 2.0  CKMBINDEX -- -- --  TROPONINI <0.30 <0.30 <0.30   BNP: No results found for this basename: PROBNP:3 in the last 72 hours D-Dimer: No results found for this basename: DDIMER:2 in the last 72 hours CBG:  Basename 07/01/11 2009  GLUCAP 181*   Hemoglobin A1C: No results found for this basename: HGBA1C in the last 72  hours Fasting Lipid Panel:  Basename 07/02/11 0014  CHOL 168  HDL 54  LDLCALC 71  TRIG 215*  CHOLHDL 3.1  LDLDIRECT --   Thyroid Function Tests: No results found for this basename: TSH,T4TOTAL,FREET4,T3FREE,THYROIDAB in the last 72 hours Anemia Panel: No results found for this basename: VITAMINB12,FOLATE,FERRITIN,TIBC,IRON,RETICCTPCT in the last 72 hours Coagulation: No results found for this basename: LABPROT:2,INR:2 in the last 72 hours Urine Drug Screen: Drugs of Abuse  No results found for this basename: labopia, cocainscrnur, labbenz, amphetmu, thcu, labbarb    Alcohol Level: No results found for this basename: ETH:2 in the last 72 hours Urinalysis:  Basename 07/01/11 2113  COLORURINE YELLOW  LABSPEC 1.027  PHURINE 6.0  GLUCOSEU NEGATIVE  HGBUR SMALL*  BILIRUBINUR NEGATIVE  KETONESUR NEGATIVE  PROTEINUR NEGATIVE  UROBILINOGEN 1.0  NITRITE NEGATIVE  LEUKOCYTESUR MODERATE*   Misc. Labs:  No results found for this or any previous visit (from the past 240 hour(s)).  Studies/Results: Ct Head Wo Contrast  07/01/2011  *RADIOLOGY REPORT*  Clinical Data: Syncopal episode, history of dementia  CT HEAD WITHOUT CONTRAST  Technique:  Contiguous axial images were obtained from the base of the skull through the vertex without contrast.  Comparison: 10/01/2010; 12/07/2009; brain MRI - 10/03/2010  Findings:  Redemonstrated diffuse atrophy.  Extensive periventricular hypodensities compatible microvascular ischemic disease.  Given background parenchymal abnormalities, there is no  discrete evidence of acute large territory infarct. Grossly unchanged lacunar infarcts within the bilateral basal ganglia.  No interparenchymal or extra-axial mass or hemorrhage.  Unchanged size and configuration of the ventricles and basilar cisterns.  Polypoid mucosal thickening within the right frontal sinus.  Remaining paranasal sinuses mastoid air cells are otherwise normal.  Regional soft tissues are  normal.  IMPRESSION: 1.  Grossly unchanged atrophy and extensive microvascular ischemic disease without definite acute intracranial process. 2.  Polypoid mucosal thickening within the right frontal sinus. No air fluid levels.  Original Report Authenticated By: Waynard Reeds, [name]   Mr [facility] Wo Contrast  07/02/2011  *RADIOLOGY REPORT*  Clinical Data:  High blood pressure.  Prior infarcts.  Syncope.  No focal weakness. Dementia.  MRI HEAD WITH AND WITHOUT CONTRAST MRA HEAD WITHOUT CONTRAST  Technique: Multiplanar, multiecho pulse sequences of the brain and surrounding structures were obtained according to standard protocol with and without intravenous contrast.  Angiographic images of the Circle of Willis were obtained using MRA technique without intravenous contrast.  Contrast: 18 ml MultiHance.  Comparison02/26/2013 CT.  10/03/2010 MR.  MRI HEAD  Findings: Questionable tiny acute infarcts anterior inferior left frontal lobe?  Remote basal ganglia, thalamic and right pontine infarcts.  Prominent small vessel disease type changes confluent periventricular subcortical region.  No intracranial mass or abnormal enhancement.  Global atrophy without hydrocephalus.  Hypertelorism with mild exophthalmos.  Opacification right frontal sinus with mild mucosal thickening ethmoid sinus air cells and maxillary sinuses.  Mild cervical spondylotic changes of the cervical spine.  Slightly heterogeneous appearance bone marrow unchanged.  Scattered punctate areas of blood breakdown products most consistent with result of hemorrhagic ischemia.  No other intracranial hemorrhage noted.  IMPRESSION: Questionable tiny acute infarcts anterior inferior left frontal lobe?  Please see above  MRA HEAD  Findings: Anterior circulation without medium or large size vessel significant stenosis or occlusion.  Mild branch vessel irregularity.  Fetal type origin of the posterior cerebral arteries.  Irregular narrowed left vertebral artery.   Right vertebral artery is dominant in size.  Fenestrated  basilar artery with irregularity/narrowing and minimal bulge proximally but without discrete saccular aneurysm.  Stability can be confirmed on follow-up.  Poor delineation of the PICAs.  Narrowing AICAs beyond the proximal aspect.  Poor delineation in the posterior cerebral artery branch vessels suggestive of prominent atherosclerotic changes greater on the right.  IMPRESSION: Intracranial atherosclerotic type changes more notable in the posterior circulation as well as involving branch vessels.  Since prior examination, a minimal bulge of the proximal fenestrated basilar artery is noted.  No discrete saccular aneurysm.  Original Report Authenticated By: Fuller Canada, [name]   Mr [facility] Wo Contrast  07/02/2011  *RADIOLOGY REPORT*  Clinical Data:  High blood pressure.  Prior infarcts.  Syncope.  No focal weakness. Dementia.  MRI HEAD WITH AND WITHOUT CONTRAST MRA HEAD WITHOUT CONTRAST  Technique: Multiplanar, multiecho pulse sequences of the brain and surrounding structures were obtained according to standard protocol with and without intravenous contrast.  Angiographic images of the Circle of Willis were obtained using MRA technique without intravenous contrast.  Contrast: 18 ml MultiHance.  Comparison02/26/2013 CT.  10/03/2010 MR.  MRI HEAD  Findings: Questionable tiny acute infarcts anterior inferior left frontal lobe?  Remote basal ganglia, thalamic and right pontine infarcts.  Prominent small vessel disease type changes confluent periventricular subcortical region.  No intracranial mass or abnormal enhancement.  Global atrophy without hydrocephalus.  Hypertelorism with mild exophthalmos.  Opacification right frontal sinus with mild mucosal thickening ethmoid sinus air cells and maxillary sinuses.  Mild cervical spondylotic changes of the cervical spine.  Slightly heterogeneous appearance bone marrow unchanged.  Scattered punctate areas of blood  breakdown products most consistent with result of hemorrhagic ischemia.  No other intracranial hemorrhage noted.  IMPRESSION: Questionable tiny acute infarcts anterior inferior left frontal lobe?  Please see above  MRA HEAD  Findings: Anterior circulation without medium or large size vessel significant stenosis or occlusion.  Mild branch vessel irregularity.  Fetal type origin of the posterior cerebral arteries.  Irregular narrowed left vertebral artery.  Right vertebral artery is dominant in size.  Fenestrated  basilar artery with irregularity/narrowing and minimal bulge proximally but without discrete saccular aneurysm.  Stability can be confirmed on follow-up.  Poor delineation of the PICAs.  Narrowing AICAs beyond the proximal aspect.  Poor delineation in the posterior cerebral artery branch vessels suggestive of prominent atherosclerotic changes greater on the right.  IMPRESSION: Intracranial atherosclerotic type changes more notable in the posterior circulation as well as involving branch vessels.  Since prior examination, a minimal bulge of the proximal fenestrated basilar artery is noted.  No discrete saccular aneurysm.  Original Report Authenticated By: Fuller Canada, [name]    Medications: Scheduled Meds:   . aspirin EC  325 mg Oral Daily  . cefTRIAXone (ROCEPHIN)  IV  1 g Intravenous Once  . clopidogrel  75 mg Oral Daily  . darifenacin  7.5 mg Oral Daily  . docusate sodium  100 mg Oral BID  . enoxaparin  40 mg Subcutaneous Q24H  . felodipine  5 mg Oral Daily  . felodipine  5 mg Oral Daily  . gadobenate dimeglumine  18 mL Intravenous Once  . hydrochlorothiazide  12.5 mg Oral Daily  . olmesartan  40 mg Oral Daily  . rivastigmine  4.6 mg Transdermal [id number]  . sertraline  50 mg Oral QHS  . sodium chloride  3 mL Intravenous Q12H  . DISCONTD: olmesartan-hydrochlorothiazide  1 tablet Oral Daily  . DISCONTD: olmesartan-hydrochlorothiazide  1 tablet Oral Daily   Continuous Infusions:   .  sodium chloride 125 mL/hr at 07/02/11 1337   PRN Meds:.acetaminophen, acetaminophen, ondansetron (ZOFRAN) IV, ondansetron  Assessment/Plan:  Principal Problem:  *Syncope vs Seizure She has had multiple admissions with questionable syncopal versus seizure, am going to be the EEG and we'll also obtain neurology consultation as MRA shows questionable tiny acute infarct in the frontal lobe. The meantime we'll continue aspirin Plavix and she has been taking at home   DEMENTIA Stable   HYPERTENSION Continue felodipine  CEREBROVASCULAR ACCIDENT, HX OF  Chronic renal insufficiency, stage I  Hypernatremia: Will change the iv fluids to 1/2 ns @75  ml/hr. Follow bmp in am..   LOS: 1 day   [facility] Pager: [phone] 07/02/2011, 6:50 PM

## 2011-07-02 NOTE — Progress Notes (Signed)
Patient was admitted to unit from the emergency department. Patient is alert and oriented. He was oriented to unit. Call bell is within reach. Telemetry is placed. Skin is intact. IV located in right hand. Saline started. Bed alarm placed due to patient's unsteady gait. Will continue to monitor. Madilyn Fireman Domino

## 2011-07-02 NOTE — Progress Notes (Signed)
Utilization review complete 

## 2011-07-02 NOTE — ED Notes (Signed)
Called and gave report to Ty.

## 2011-07-02 NOTE — Progress Notes (Signed)
*  PRELIMINARY RESULTS* Echocardiogram 2D Echocardiogram has been performed.  Mike [facility] 07/02/2011, 11:35 AM

## 2011-07-03 ENCOUNTER — Inpatient Hospital Stay (HOSPITAL_COMMUNITY): Payer: Medicare Other

## 2011-07-03 ENCOUNTER — Observation Stay (HOSPITAL_COMMUNITY): Payer: Medicare Other

## 2011-07-03 DIAGNOSIS — N181 Chronic kidney disease, stage 1: Secondary | ICD-10-CM | POA: Diagnosis not present

## 2011-07-03 DIAGNOSIS — I10 Essential (primary) hypertension: Secondary | ICD-10-CM | POA: Diagnosis not present

## 2011-07-03 DIAGNOSIS — E870 Hyperosmolality and hypernatremia: Secondary | ICD-10-CM | POA: Diagnosis not present

## 2011-07-03 DIAGNOSIS — Z7902 Long term (current) use of antithrombotics/antiplatelets: Secondary | ICD-10-CM | POA: Diagnosis not present

## 2011-07-03 DIAGNOSIS — I359 Nonrheumatic aortic valve disorder, unspecified: Secondary | ICD-10-CM | POA: Diagnosis not present

## 2011-07-03 DIAGNOSIS — I498 Other specified cardiac arrhythmias: Secondary | ICD-10-CM | POA: Diagnosis not present

## 2011-07-03 DIAGNOSIS — Z8546 Personal history of malignant neoplasm of prostate: Secondary | ICD-10-CM | POA: Diagnosis not present

## 2011-07-03 DIAGNOSIS — I658 Occlusion and stenosis of other precerebral arteries: Secondary | ICD-10-CM | POA: Diagnosis not present

## 2011-07-03 DIAGNOSIS — I6330 Cerebral infarction due to thrombosis of unspecified cerebral artery: Secondary | ICD-10-CM | POA: Diagnosis not present

## 2011-07-03 DIAGNOSIS — I6509 Occlusion and stenosis of unspecified vertebral artery: Secondary | ICD-10-CM | POA: Diagnosis not present

## 2011-07-03 DIAGNOSIS — R55 Syncope and collapse: Secondary | ICD-10-CM | POA: Diagnosis not present

## 2011-07-03 DIAGNOSIS — N390 Urinary tract infection, site not specified: Secondary | ICD-10-CM | POA: Diagnosis not present

## 2011-07-03 DIAGNOSIS — F0390 Unspecified dementia without behavioral disturbance: Secondary | ICD-10-CM | POA: Diagnosis not present

## 2011-07-03 DIAGNOSIS — Z8673 Personal history of transient ischemic attack (TIA), and cerebral infarction without residual deficits: Secondary | ICD-10-CM | POA: Diagnosis not present

## 2011-07-03 DIAGNOSIS — I129 Hypertensive chronic kidney disease with stage 1 through stage 4 chronic kidney disease, or unspecified chronic kidney disease: Secondary | ICD-10-CM | POA: Diagnosis present

## 2011-07-03 DIAGNOSIS — I712 Thoracic aortic aneurysm, without rupture: Secondary | ICD-10-CM | POA: Diagnosis not present

## 2011-07-03 DIAGNOSIS — Z7982 Long term (current) use of aspirin: Secondary | ICD-10-CM | POA: Diagnosis not present

## 2011-07-03 DIAGNOSIS — Z7901 Long term (current) use of anticoagulants: Secondary | ICD-10-CM | POA: Diagnosis not present

## 2011-07-03 DIAGNOSIS — R569 Unspecified convulsions: Secondary | ICD-10-CM | POA: Diagnosis present

## 2011-07-03 DIAGNOSIS — E785 Hyperlipidemia, unspecified: Secondary | ICD-10-CM | POA: Diagnosis present

## 2011-07-03 DIAGNOSIS — G40309 Generalized idiopathic epilepsy and epileptic syndromes, not intractable, without status epilepticus: Secondary | ICD-10-CM | POA: Diagnosis not present

## 2011-07-03 LAB — BASIC METABOLIC PANEL
Calcium: 9 mg/dL (ref 8.4–10.5)
Creatinine, Ser: 0.98 mg/dL (ref 0.50–1.35)
GFR calc Af Amer: >90 mL/min (ref >90)
GFR calc non Af Amer: 78 mL/min — ABNORMAL LOW (ref >90)

## 2011-07-03 MED ORDER — FELODIPINE ER 10 MG PO TB24
10.0000 mg | ORAL_TABLET | Freq: Every day | ORAL | Status: DC
  Administered 2011-07-04 – 2011-07-05 (×2): 10 mg via ORAL
  Filled 2011-07-03 (×2): qty 1

## 2011-07-03 MED ORDER — LEVETIRACETAM 250 MG PO TABS
250.0000 mg | ORAL_TABLET | Freq: Two times a day (BID) | ORAL | Status: DC
  Administered 2011-07-03 – 2011-07-05 (×5): 250 mg via ORAL
  Filled 2011-07-03 (×6): qty 1

## 2011-07-03 MED ORDER — IOHEXOL 350 MG/ML SOLN
50.0000 mL | Freq: Once | INTRAVENOUS | Status: AC | PRN
  Administered 2011-07-03: 50 mL via INTRAVENOUS

## 2011-07-03 MED ORDER — ASPIRIN 81 MG PO CHEW
81.0000 mg | CHEWABLE_TABLET | Freq: Every day | ORAL | Status: DC
  Administered 2011-07-04 – 2011-07-05 (×2): 81 mg via ORAL
  Filled 2011-07-03 (×2): qty 1

## 2011-07-03 NOTE — Progress Notes (Signed)
Subjective: Patient seen and examined, admitted with questionable syncope versus seizure. EEG was done today, result is pending. Neuro has seen the patient and started low dose keppra. Objective: Vital signs in last 24 hours: Temp:  [97.5 F (36.4 C)-98.9 F (37.2 C)] 98.9 F (37.2 C) (02/28 1358) Pulse Rate:  [59-78] 78  (02/28 1358) Resp:  [16-18] 18  (02/28 1358) BP: (161-176)/(87-94) 161/87 mmHg (02/28 1358) SpO2:  [94 %-100 %] 94 % (02/28 1358) Weight change:  Last BM Date: 07/01/11  Intake/Output from previous day: 02/27 0701 - 02/28 0700 In: -  Out: 575 [Urine:575] Total I/O In: 360 [P.O.:360] Out: 800 [Urine:800]   Physical Exam: HEENT: Atraumatic, normocephalic Neck: Supple Chest : Clear to auscultation bilaterally, no wheezing, no crackles Heart : S1 S2 Regular, no murmurs Abdomen: Soft, Nontender, no organomegaly Ext : No cyanosis, clubbing, edema   Lab Results: Basic Metabolic Panel:  Basename 07/03/11 0535 07/02/11 0530  NA 139 147*  K 3.7 4.4  CL 106 112  CO2 27 25  GLUCOSE 86 112*  BUN 17 25*  CREATININE 0.98 1.11  CALCIUM 9.0 9.1  MG -- --  PHOS -- --   Liver Function Tests:  Basename 07/02/11 0530  AST 28  ALT 20  ALKPHOS 70  BILITOT 0.3  PROT 7.0  ALBUMIN 3.1*   No results found for this basename: LIPASE:2,AMYLASE:2 in the last 72 hours No results found for this basename: AMMONIA:2 in the last 72 hours CBC:  Basename 07/02/11 0530 07/01/11 2010  WBC 7.8 8.3  NEUTROABS -- 6.5  HGB 12.8* 14.1  HCT 38.3* 43.2  MCV 84.7 85.4  PLT 190 195   Cardiac Enzymes:  Basename 07/02/11 1601 07/02/11 0858 07/02/11 0014  CKTOTAL 101 110 117  CKMB 2.0 2.0 2.0  CKMBINDEX -- -- --  TROPONINI <0.30 <0.30 <0.30   BNP: No results found for this basename: PROBNP:3 in the last 72 hours D-Dimer: No results found for this basename: DDIMER:2 in the last 72 hours CBG:  Basename 07/01/11 2009  GLUCAP 181*   Hemoglobin A1C: No results found  for this basename: HGBA1C in the last 72 hours Fasting Lipid Panel:  Basename 07/02/11 0014  CHOL 168  HDL 54  LDLCALC 71  TRIG 215*  CHOLHDL 3.1  LDLDIRECT --   Thyroid Function Tests: No results found for this basename: TSH,T4TOTAL,FREET4,T3FREE,THYROIDAB in the last 72 hours Anemia Panel: No results found for this basename: VITAMINB12,FOLATE,FERRITIN,TIBC,IRON,RETICCTPCT in the last 72 hours Coagulation: No results found for this basename: LABPROT:2,INR:2 in the last 72 hours Urine Drug Screen: Drugs of Abuse  No results found for this basename: labopia,  cocainscrnur,  labbenz,  amphetmu,  thcu,  labbarb    Alcohol Level: No results found for this basename: ETH:2 in the last 72 hours Urinalysis:  Basename 07/01/11 2113  COLORURINE YELLOW  LABSPEC 1.027  PHURINE 6.0  GLUCOSEU NEGATIVE  HGBUR SMALL*  BILIRUBINUR NEGATIVE  KETONESUR NEGATIVE  PROTEINUR NEGATIVE  UROBILINOGEN 1.0  NITRITE NEGATIVE  LEUKOCYTESUR MODERATE*   Misc. Labs:  No results found for this or any previous visit (from the past 240 hour(s)).  Studies/Results: Ct Head Wo Contrast  07/01/2011  *RADIOLOGY REPORT*  Clinical Data: Syncopal episode, history of dementia  CT HEAD WITHOUT CONTRAST  Technique:  Contiguous axial images were obtained from the base of the skull through the vertex without contrast.  Comparison: 10/01/2010; 12/07/2009; brain MRI - 10/03/2010  Findings:  Redemonstrated diffuse atrophy.  Extensive periventricular hypodensities compatible microvascular  ischemic disease.  Given background parenchymal abnormalities, there is no discrete evidence of acute large territory infarct. Grossly unchanged lacunar infarcts within the bilateral basal ganglia.  No interparenchymal or extra-axial mass or hemorrhage.  Unchanged size and configuration of the ventricles and basilar cisterns.  Polypoid mucosal thickening within the right frontal sinus.  Remaining paranasal sinuses mastoid air cells are  otherwise normal.  Regional soft tissues are normal.  IMPRESSION: 1.  Grossly unchanged atrophy and extensive microvascular ischemic disease without definite acute intracranial process. 2.  Polypoid mucosal thickening within the right frontal sinus. No air fluid levels.  Original Report Authenticated By: Waynard Reeds, [name]   Mr [facility] Wo Contrast  07/02/2011  *RADIOLOGY REPORT*  Clinical Data:  High blood pressure.  Prior infarcts.  Syncope.  No focal weakness. Dementia.  MRI HEAD WITH AND WITHOUT CONTRAST MRA HEAD WITHOUT CONTRAST  Technique: Multiplanar, multiecho pulse sequences of the brain and surrounding structures were obtained according to standard protocol with and without intravenous contrast.  Angiographic images of the Circle of [place] were obtained using MRA technique without intravenous contrast.  Contrast: 18 ml MultiHance.  Comparison02/26/2013 CT.  10/03/2010 MR.  MRI HEAD  Findings: Questionable tiny acute infarcts anterior inferior left frontal lobe?  Remote basal ganglia, thalamic and right pontine infarcts.  Prominent small vessel disease type changes confluent periventricular subcortical region.  No intracranial mass or abnormal enhancement.  Global atrophy without hydrocephalus.  Hypertelorism with mild exophthalmos.  Opacification right frontal sinus with mild mucosal thickening ethmoid sinus air cells and maxillary sinuses.  Mild cervical spondylotic changes of the cervical spine.  Slightly heterogeneous appearance bone marrow unchanged.  Scattered punctate areas of blood breakdown products most consistent with result of hemorrhagic ischemia.  No other intracranial hemorrhage noted.  IMPRESSION: Questionable tiny acute infarcts anterior inferior left frontal lobe?  Please see above  MRA HEAD  Findings: Anterior circulation without medium or large size vessel significant stenosis or occlusion.  Mild branch vessel irregularity.  Fetal type origin of the posterior cerebral arteries.   Irregular narrowed left vertebral artery.  Right vertebral artery is dominant in size.  Fenestrated  basilar artery with irregularity/narrowing and minimal bulge proximally but without discrete saccular aneurysm.  Stability can be confirmed on follow-up.  Poor delineation of the PICAs.  Narrowing AICAs beyond the proximal aspect.  Poor delineation in the posterior cerebral artery branch vessels suggestive of prominent atherosclerotic changes greater on the right.  IMPRESSION: Intracranial atherosclerotic type changes more notable in the posterior circulation as well as involving branch vessels.  Since prior examination, a minimal bulge of the proximal fenestrated basilar artery is noted.  No discrete saccular aneurysm.  Original Report Authenticated By: Fuller Canada, [name]   Mr [facility] Wo Contrast  07/02/2011  *RADIOLOGY REPORT*  Clinical Data:  High blood pressure.  Prior infarcts.  Syncope.  No focal weakness. Dementia.  MRI HEAD WITH AND WITHOUT CONTRAST MRA HEAD WITHOUT CONTRAST  Technique: Multiplanar, multiecho pulse sequences of the brain and surrounding structures were obtained according to standard protocol with and without intravenous contrast.  Angiographic images of the Circle of [place] were obtained using MRA technique without intravenous contrast.  Contrast: 18 ml MultiHance.  Comparison02/26/2013 CT.  10/03/2010 MR.  MRI HEAD  Findings: Questionable tiny acute infarcts anterior inferior left frontal lobe?  Remote basal ganglia, thalamic and right pontine infarcts.  Prominent small vessel disease type changes confluent periventricular subcortical region.  No intracranial mass or abnormal enhancement.  Global atrophy without hydrocephalus.  Hypertelorism with mild exophthalmos.  Opacification right frontal sinus with mild mucosal thickening ethmoid sinus air cells and maxillary sinuses.  Mild cervical spondylotic changes of the cervical spine.  Slightly heterogeneous appearance bone marrow  unchanged.  Scattered punctate areas of blood breakdown products most consistent with result of hemorrhagic ischemia.  No other intracranial hemorrhage noted.  IMPRESSION: Questionable tiny acute infarcts anterior inferior left frontal lobe?  Please see above  MRA HEAD  Findings: Anterior circulation without medium or large size vessel significant stenosis or occlusion.  Mild branch vessel irregularity.  Fetal type origin of the posterior cerebral arteries.  Irregular narrowed left vertebral artery.  Right vertebral artery is dominant in size.  Fenestrated  basilar artery with irregularity/narrowing and minimal bulge proximally but without discrete saccular aneurysm.  Stability can be confirmed on follow-up.  Poor delineation of the PICAs.  Narrowing AICAs beyond the proximal aspect.  Poor delineation in the posterior cerebral artery branch vessels suggestive of prominent atherosclerotic changes greater on the right.  IMPRESSION: Intracranial atherosclerotic type changes more notable in the posterior circulation as well as involving branch vessels.  Since prior examination, a minimal bulge of the proximal fenestrated basilar artery is noted.  No discrete saccular aneurysm.  Original Report Authenticated By: Fuller Canada, [name]    Medications: Scheduled Meds:    . aspirin  81 mg Oral Daily  . clopidogrel  75 mg Oral Daily  . darifenacin  7.5 mg Oral Daily  . docusate sodium  100 mg Oral BID  . enoxaparin  40 mg Subcutaneous Q24H  . felodipine  5 mg Oral Daily  . hydrochlorothiazide  12.5 mg Oral Daily  . levETIRAcetam  250 mg Oral BID  . olmesartan  40 mg Oral Daily  . rivastigmine  4.6 mg Transdermal BH-q7a  . sertraline  50 mg Oral QHS  . sodium chloride  3 mL Intravenous Q12H  . DISCONTD: aspirin EC  325 mg Oral Daily  . DISCONTD: felodipine  5 mg Oral Daily   Continuous Infusions:    . sodium chloride 75 mL/hr at 07/03/11 1315  . DISCONTD: sodium chloride 125 mL/hr at 07/02/11 1337    PRN Meds:.acetaminophen, acetaminophen, iohexol, ondansetron (ZOFRAN) IV, ondansetron  Assessment/Plan:  Principal Problem:  *Syncope vs Seizure She has had multiple admissions with questionable syncopal versus seizure, EEG done result is pending. Patient started on Keppra 250 mg po Bid.   DEMENTIA Stable   HYPERTENSION Continue felodipine  CEREBROVASCULAR ACCIDENT, HX OF  Chronic renal insufficiency, stage I  Hypernatremia: Resolved. Will cut down iv fluids to kvo   LOS: 2 days   [facility] Pager: [phone] 07/03/2011, 6:17 PM

## 2011-07-03 NOTE — Progress Notes (Signed)
Stroke Team Progress Note  HISTORY Aras Albarran is an 78 y.o. male black admitted via ER for fall. Pt has dementia and does not recall why he came to tht hospital. Some of the history is coming from charting for this reason.  The chart the patient was in his kitchen when his wife walked in and found him shaking. He apparently had a loss of consciousness for about 10 minutes. The patient tells me that he is feeling fine now and everything is okay. Is No further information  SUBJECTIVE No family is at the bedside. Overall he feels his condition is rapidly improving. The patient reports no discomfort, no headache, numbness, or weakness.  OBJECTIVE Most recent Vital Signs: Temp: 97.5 F (36.4 C) (02/28 0527) Temp src: Oral (02/28 0527) BP: 176/94 mmHg (02/28 0527) Pulse Rate: 59  (02/28 0527) Respiratory Rate: 16 O2 Saturation: 100%  CBG (last 3)  Basename 07/01/11 2009  GLUCAP 181*   Intake/Output from previous day: 02/27 0701 - 02/28 0700 In: -  Out: 575 [Urine:575]  IV Fluid Intake:     . sodium chloride 75 mL/hr at 07/02/11 2051  . DISCONTD: sodium chloride 125 mL/hr at 07/02/11 1337   Medications    . aspirin EC  325 mg Oral Daily  . clopidogrel  75 mg Oral Daily  . darifenacin  7.5 mg Oral Daily  . docusate sodium  100 mg Oral BID  . enoxaparin  40 mg Subcutaneous Q24H  . felodipine  5 mg Oral Daily  . felodipine  5 mg Oral Daily  . hydrochlorothiazide  12.5 mg Oral Daily  . olmesartan  40 mg Oral Daily  . rivastigmine  4.6 mg Transdermal BH-q7a  . sertraline  50 mg Oral QHS  . sodium chloride  3 mL Intravenous Q12H  PRN acetaminophen, acetaminophen, iohexol, ondansetron (ZOFRAN) IV, ondansetron  Diet:  Cardiac  Thin liquids Activity: Up with assistance DVT Prophylaxis:  Lovenox  Significant Diagnostic Studies: CBC    Component Value Date/Time   WBC 7.8 07/02/2011 0530   RBC 4.52 07/02/2011 0530   HGB 12.8* 07/02/2011 0530   HCT 38.3* 07/02/2011 0530   PLT 190  07/02/2011 0530   MCV 84.7 07/02/2011 0530   MCH 28.3 07/02/2011 0530   MCHC 33.4 07/02/2011 0530   RDW 14.0 07/02/2011 0530   LYMPHSABS 1.3 07/01/2011 2010   MONOABS 0.5 07/01/2011 2010   EOSABS 0.0 07/01/2011 2010   BASOSABS 0.0 07/01/2011 2010   CMP    Component Value Date/Time   NA 139 07/03/2011 0535   K 3.7 07/03/2011 0535   CL 106 07/03/2011 0535   CO2 27 07/03/2011 0535   GLUCOSE 86 07/03/2011 0535   BUN 17 07/03/2011 0535   CREATININE 0.98 07/03/2011 0535   CALCIUM 9.0 07/03/2011 0535   PROT 7.0 07/02/2011 0530   ALBUMIN 3.1* 07/02/2011 0530   AST 28 07/02/2011 0530   ALT 20 07/02/2011 0530   ALKPHOS 70 07/02/2011 0530   BILITOT 0.3 07/02/2011 0530   GFRNONAA 78* 07/03/2011 0535   GFRAA >90 07/03/2011 0535   COAGS Lab Results  Component Value Date   INR 1.05 10/01/2010   Lipid Panel    Component Value Date/Time   CHOL 168 07/02/2011 0014   TRIG 215* 07/02/2011 0014   HDL 54 07/02/2011 0014   CHOLHDL 3.1 07/02/2011 0014   VLDL 43* 07/02/2011 0014   LDLCALC 71 07/02/2011 0014   HgbA1C  Lab Results  Component Value Date  HGBA1C  Value: 5.7 (NOTE)                                                                       According to the ADA Clinical Practice Recommendations for 2011, when HbA1c is used as a screening test:   >=6.5%   Diagnostic of Diabetes Mellitus           (if abnormal result  is confirmed)  5.7-6.4%   Increased risk of developing Diabetes Mellitus  References:Diagnosis and Classification of Diabetes Mellitus,Diabetes Care,2011,34(Suppl 1):S62-S69 and Standards of Medical Care in         Diabetes - 2011,Diabetes Care,2011,34  (Suppl 1):S11-S61.* 10/02/2010   Urine Drug Screen  No results found for this basename: labopia, cocainscrnur, labbenz, amphetmu, thcu, labbarb    Alcohol Level No results found for this basename: eth     Results for orders placed during the hospital encounter of 07/01/11 (from the past 24 hour(s))  CARDIAC PANEL(CRET KIN+CKTOT+MB+TROPI)     Status:  Normal   Collection Time   07/02/11  4:01 PM      Component Value Range   Total CK 101  7 - 232 (U/L)   CK, MB 2.0  0.3 - 4.0 (ng/mL)   Troponin I <0.30  <0.30 (ng/mL)   Relative Index 2.0  0.0 - 2.5   BASIC METABOLIC PANEL     Status: Abnormal   Collection Time   07/03/11  5:35 AM      Component Value Range   Sodium 139  135 - 145 (mEq/L)   Potassium 3.7  3.5 - 5.1 (mEq/L)   Chloride 106  96 - 112 (mEq/L)   CO2 27  19 - 32 (mEq/L)   Glucose, Bld 86  70 - 99 (mg/dL)   BUN 17  6 - 23 (mg/dL)   Creatinine, Ser 6.21  0.50 - 1.35 (mg/dL)   Calcium 9.0  8.4 - 30.8 (mg/dL)   GFR calc non Af Amer 78 (*) >90 (mL/min)   GFR calc Af Amer >90  >90 (mL/min)    Ct Head Wo Contrast  07/01/2011  *RADIOLOGY REPORT*  Clinical Data: Syncopal episode, history of dementia  CT HEAD WITHOUT CONTRAST  Technique:  Contiguous axial images were obtained from the base of the skull through the vertex without contrast.  Comparison: 10/01/2010; 12/07/2009; brain MRI - 10/03/2010  Findings:  Redemonstrated diffuse atrophy.  Extensive periventricular hypodensities compatible microvascular ischemic disease.  Given background parenchymal abnormalities, there is no discrete evidence of acute large territory infarct. Grossly unchanged lacunar infarcts within the bilateral basal ganglia.  No interparenchymal or extra-axial mass or hemorrhage.  Unchanged size and configuration of the ventricles and basilar cisterns.  Polypoid mucosal thickening within the right frontal sinus.  Remaining paranasal sinuses mastoid air cells are otherwise normal.  Regional soft tissues are normal.  IMPRESSION: 1.  Grossly unchanged atrophy and extensive microvascular ischemic disease without definite acute intracranial process. 2.  Polypoid mucosal thickening within the right frontal sinus. No air fluid levels.  Original Report Authenticated By: Waynard Reeds, [name]   Mr [facility] Wo Contrast  07/02/2011  *RADIOLOGY REPORT*  Clinical Data:  High  blood pressure.  Prior infarcts.  Syncope.  No focal  weakness. Dementia.  MRI HEAD WITH AND WITHOUT CONTRAST MRA HEAD WITHOUT CONTRAST  Technique: Multiplanar, multiecho pulse sequences of the brain and surrounding structures were obtained according to standard protocol with and without intravenous contrast.  Angiographic images of the Circle of [place] were obtained using MRA technique without intravenous contrast.  Contrast: 18 ml MultiHance.  Comparison02/26/2013 CT.  10/03/2010 MR.  MRI HEAD  Findings: Questionable tiny acute infarcts anterior inferior left frontal lobe?  Remote basal ganglia, thalamic and right pontine infarcts.  Prominent small vessel disease type changes confluent periventricular subcortical region.  No intracranial mass or abnormal enhancement.  Global atrophy without hydrocephalus.  Hypertelorism with mild exophthalmos.  Opacification right frontal sinus with mild mucosal thickening ethmoid sinus air cells and maxillary sinuses.  Mild cervical spondylotic changes of the cervical spine.  Slightly heterogeneous appearance bone marrow unchanged.  Scattered punctate areas of blood breakdown products most consistent with result of hemorrhagic ischemia.  No other intracranial hemorrhage noted.  IMPRESSION: Questionable tiny acute infarcts anterior inferior left frontal lobe?  Please see above  MRA HEAD  Findings: Anterior circulation without medium or large size vessel significant stenosis or occlusion.  Mild branch vessel irregularity.  Fetal type origin of the posterior cerebral arteries.  Irregular narrowed left vertebral artery.  Right vertebral artery is dominant in size.  Fenestrated  basilar artery with irregularity/narrowing and minimal bulge proximally but without discrete saccular aneurysm.  Stability can be confirmed on follow-up.  Poor delineation of the PICAs.  Narrowing AICAs beyond the proximal aspect.  Poor delineation in the posterior cerebral artery branch vessels suggestive of  prominent atherosclerotic changes greater on the right.  IMPRESSION: Intracranial atherosclerotic type changes more notable in the posterior circulation as well as involving branch vessels.  Since prior examination, a minimal bulge of the proximal fenestrated basilar artery is noted.  No discrete saccular aneurysm.  Original Report Authenticated By: Fuller Canada, [name]   Mr [facility] Wo Contrast  07/02/2011  *RADIOLOGY REPORT*  Clinical Data:  High blood pressure.  Prior infarcts.  Syncope.  No focal weakness. Dementia.  MRI HEAD WITH AND WITHOUT CONTRAST MRA HEAD WITHOUT CONTRAST  Technique: Multiplanar, multiecho pulse sequences of the brain and surrounding structures were obtained according to standard protocol with and without intravenous contrast.  Angiographic images of the Circle [place] were obtained using MRA technique without intravenous contrast.  Contrast: 18 ml MultiHance.  Comparison02/26/2013 CT.  10/03/2010 MR.  MRI HEAD  Findings: Questionable tiny acute infarcts anterior inferior left frontal lobe?  Remote basal ganglia, thalamic and right pontine infarcts.  Prominent small vessel disease type changes confluent periventricular subcortical region.  No intracranial mass or abnormal enhancement.  Global atrophy without hydrocephalus.  Hypertelorism with mild exophthalmos.  Opacification right frontal sinus with mild mucosal thickening ethmoid sinus air cells and maxillary sinuses.  Mild cervical spondylotic changes of the cervical spine.  Slightly heterogeneous appearance bone marrow unchanged.  Scattered punctate areas of blood breakdown products most consistent with result of hemorrhagic ischemia.  No other intracranial hemorrhage noted.  IMPRESSION: Questionable tiny acute infarcts anterior inferior left frontal lobe?  Please see above  MRA HEAD  Findings: Anterior circulation without medium or large size vessel significant stenosis or occlusion.  Mild branch vessel irregularity.  Fetal type  origin of the posterior cerebral arteries.  Irregular narrowed left vertebral artery.  Right vertebral artery is dominant in size.  Fenestrated  basilar artery with irregularity/narrowing and minimal bulge proximally but without discrete saccular  aneurysm.  Stability can be confirmed on follow-up.  Poor delineation of the PICAs.  Narrowing AICAs beyond the proximal aspect.  Poor delineation in the posterior cerebral artery branch vessels suggestive of prominent atherosclerotic changes greater on the right.  IMPRESSION: Intracranial atherosclerotic type changes more notable in the posterior circulation as well as involving branch vessels.  Since prior examination, a minimal bulge of the proximal fenestrated basilar artery is noted.  No discrete saccular aneurysm.  Original Report Authenticated By: Fuller Canada, [name]    CT of the brain  IMPRESSION:  1. Grossly unchanged atrophy and extensive microvascular ischemic  disease without definite acute intracranial process.  2. Polypoid mucosal thickening within the right frontal sinus. No  air fluid levels.   CT angio  not ordered  MRI of the brain   IMPRESSION:  Questionable tiny acute infarcts anterior inferior left frontal  lobe?  MRA of the brain IMPRESSION:  Intracranial atherosclerotic type changes more notable in the  posterior circulation as well as involving branch vessels.  Since prior examination, a minimal bulge of the proximal  fenestrated basilar artery is noted. No discrete saccular  aneurysm.   2D Echocardiogram  Study Conclusions  - Left ventricle: Systolic function was normal. The estimated ejection fraction was in the range of 55% to 60%. - Aortic valve: Moderate regurgitation. - Aorta: The aorta was moderately dilated. Transthoracic echocardiography.  Carotid Doppler  pending  CXR  not ordered  EKG  normal sinus rhythm, first degree AV block, probable old inferior infarct.   EEG pending.    Physical Exam   The patient is alert and cooperative. The patient is oriented to person and place, not date.  Neurologic exam reveals full extraocular movements, speech is normal. Visual fields are full.  Motor testing reveals good strength of all four extremities. No drift is seen with the upper extremities.  The patient has good finger-nose-finger and heel-to-shin bilaterally. Gait was not tested.  Deep tendon reflexes are symmetric and normal. Toes are down going bilaterally.    ASSESSMENT Mr. Kadrian Partch is a 78 y.o. male with a small left frontal infarct (2), secondary to possible embolus. On aspirin 325 mg orally every day and clopidogrel 75 mg orally every day for secondary stroke prevention.  Stroke risk factors:  hyperlipidemia and hypertension  Hospital day # 2  TREATMENT/PLAN Continue aspirin 325 mg orally every day and clopidogrel 75 mg orally every day for secondary stroke prevention.   The patient has been on aspirin and Plavix prior to admission. The patient has 2 small left frontal diffusion weighted positive lesions that may represent acute strokes. By history, the patient presented with a seizure event that may be associated with his underlying dementia. The patient has significant dementia prior to admission. The patient is followed through office. The patient is on low-dose Exelon, which can lower the seizure threshold. At this point, I will add low-dose Keppra to his regimen. An EEG study is pending.  The full dose aspirin could be reduced to an 81 mg tablet.  Lesly Dukes

## 2011-07-03 NOTE — Consult Note (Signed)
Reason for Consult:stroke   Referring Physician:   CC: stroke   HPI: Mike Williams is an 78 y.o. male black admitted via ER for fall. Pt has dementia and does not recall why he came to tht hospital.  Some of the history is coming from charting for this reason.  The chart the patient was in his kitchen when his wife walked in and found him shaking. He apparently had a loss of consciousness for about 10 minutes. The patient tells me that he is feeling fine now and everything is okay. Is No further information   Past Medical History  Diagnosis Date  . CEREBROVASCULAR ACCIDENT, HX OF 02/09/2007  . DEMENTIA 05/12/2007  . HYPERGLYCEMIA 09/21/2009  . HYPERTENSION 12/29/2006  . OBSTRUCTIVE SLEEP APNEA 12/29/2006  . PROSTATE CANCER, HX OF 12/29/2006  . Hemorrhoids   . Lactose intolerance   . Risk for falls     Past Surgical History  Procedure Date  . Cholecystectomy   . Prostate surgery     cryotherapy    Family History  Problem Relation Age of Onset  . Heart attack Father   . Heart disease Father   . Cancer Sister     Social History:  reports that he has never smoked. He does not have any smokeless tobacco history on file. His alcohol and drug histories not on file.  No Known Allergies  Medications: I have reviewed the patient's current medications ROS: unable to obtain  Physical Examination: Blood pressure 163/94, pulse 63, temperature 97.8 F (36.6 C), temperature source Oral, resp. rate 18, height 5\' 7"  (1.702 m), weight 83.915 kg (185 lb), SpO2 97.00%. General: The patient is alert. He is not participating in the exam to the full extent. Exam is limited for this reason Cardiovascular: Heart tones S1 and S2 are heard. No S3/S4/murmurs/carotid bruits Lungs: Clear to auscultation bilaterally in the front fields Abdomen: Soft Neurologic Examination Speech is fluent. No facial droop. Pupils are equal round and reactive to light. Tongue midline. Motor: Grossly symmetric. Normal  tone Reflexes: Symmetric, no Babinski's  Results for orders placed during the hospital encounter of 07/01/11 (from the past 48 hour(s))  GLUCOSE, CAPILLARY     Status: Abnormal   Collection Time   07/01/11  8:09 PM      Component Value Range Comment   Glucose-Capillary 181 (*) 70 - 99 (mg/dL)    Comment 1 Documented in Chart      Comment 2 Notify RN     CBC     Status: Normal   Collection Time   07/01/11  8:10 PM      Component Value Range Comment   WBC 8.3  4.0 - 10.5 (K/uL)    RBC 5.06  4.22 - 5.81 (MIL/uL)    Hemoglobin 14.1  13.0 - 17.0 (g/dL)    HCT 16.1  09.6 - 04.5 (%)    MCV 85.4  78.0 - 100.0 (fL)    MCH 27.9  26.0 - 34.0 (pg)    MCHC 32.6  30.0 - 36.0 (g/dL)    RDW 40.9  81.1 - 91.4 (%)    Platelets 195  150 - 400 (K/uL)   DIFFERENTIAL     Status: Abnormal   Collection Time   07/01/11  8:10 PM      Component Value Range Comment   Neutrophils Relative 78 (*) 43 - 77 (%)    Neutro Abs 6.5  1.7 - 7.7 (K/uL)    Lymphocytes Relative 15  12 -  46 (%)    Lymphs Abs 1.3  0.7 - 4.0 (K/uL)    Monocytes Relative 6  3 - 12 (%)    Monocytes Absolute 0.5  0.1 - 1.0 (K/uL)    Eosinophils Relative 0  0 - 5 (%)    Eosinophils Absolute 0.0  0.0 - 0.7 (K/uL)    Basophils Relative 0  0 - 1 (%)    Basophils Absolute 0.0  0.0 - 0.1 (K/uL)   BASIC METABOLIC PANEL     Status: Abnormal   Collection Time   07/01/11  8:10 PM      Component Value Range Comment   Sodium 146 (*) 135 - 145 (mEq/L)    Potassium 4.1  3.5 - 5.1 (mEq/L)    Chloride 107  96 - 112 (mEq/L)    CO2 31  19 - 32 (mEq/L)    Glucose, Bld 170 (*) 70 - 99 (mg/dL)    BUN 23  6 - 23 (mg/dL)    Creatinine, Ser 1.61 (*) 0.50 - 1.35 (mg/dL)    Calcium 9.9  8.4 - 10.5 (mg/dL)    GFR calc non Af Amer 48 (*) >90 (mL/min)    GFR calc Af Amer 56 (*) >90 (mL/min)   POCT I-STAT TROPONIN I     Status: Normal   Collection Time   07/01/11  8:27 PM      Component Value Range Comment   Troponin i, poc 0.00  0.00 - 0.08 (ng/mL)     Comment 3            URINALYSIS, ROUTINE W REFLEX MICROSCOPIC     Status: Abnormal   Collection Time   07/01/11  9:13 PM      Component Value Range Comment   Color, Urine YELLOW  YELLOW     APPearance CLOUDY (*) CLEAR     Specific Gravity, Urine 1.027  1.005 - 1.030     pH 6.0  5.0 - 8.0     Glucose, UA NEGATIVE  NEGATIVE (mg/dL)    Hgb urine dipstick SMALL (*) NEGATIVE     Bilirubin Urine NEGATIVE  NEGATIVE     Ketones, ur NEGATIVE  NEGATIVE (mg/dL)    Protein, ur NEGATIVE  NEGATIVE (mg/dL)    Urobilinogen, UA 1.0  0.0 - 1.0 (mg/dL)    Nitrite NEGATIVE  NEGATIVE     Leukocytes, UA MODERATE (*) NEGATIVE    URINE MICROSCOPIC-ADD ON     Status: Abnormal   Collection Time   07/01/11  9:13 PM      Component Value Range Comment   Squamous Epithelial / LPF RARE  RARE     WBC, UA 3-6  <3 (WBC/hpf)    RBC / HPF 3-6  <3 (RBC/hpf)    Casts HYALINE CASTS (*) NEGATIVE     Urine-Other MUCOUS PRESENT     LIPID PANEL     Status: Abnormal   Collection Time   07/02/11 12:14 AM      Component Value Range Comment   Cholesterol 168  0 - 200 (mg/dL)    Triglycerides 096 (*) <150 (mg/dL)    HDL 54  >04 (mg/dL)    Total CHOL/HDL Ratio 3.1      VLDL 43 (*) 0 - 40 (mg/dL)    LDL Cholesterol 71  0 - 99 (mg/dL)   CARDIAC PANEL(CRET KIN+CKTOT+MB+TROPI)     Status: Normal   Collection Time   07/02/11 12:14 AM      Component Value  Range Comment   Total CK 117  7 - 232 (U/L)    CK, MB 2.0  0.3 - 4.0 (ng/mL)    Troponin I <0.30  <0.30 (ng/mL)    Relative Index 1.7  0.0 - 2.5    COMPREHENSIVE METABOLIC PANEL     Status: Abnormal   Collection Time   07/02/11  5:30 AM      Component Value Range Comment   Sodium 147 (*) 135 - 145 (mEq/L)    Potassium 4.4  3.5 - 5.1 (mEq/L) HEMOLYSIS AT THIS LEVEL MAY AFFECT RESULT   Chloride 112  96 - 112 (mEq/L)    CO2 25  19 - 32 (mEq/L)    Glucose, Bld 112 (*) 70 - 99 (mg/dL)    BUN 25 (*) 6 - 23 (mg/dL)    Creatinine, Ser 1.61  0.50 - 1.35 (mg/dL)    Calcium 9.1   8.4 - 10.5 (mg/dL)    Total Protein 7.0  6.0 - 8.3 (g/dL)    Albumin 3.1 (*) 3.5 - 5.2 (g/dL)    AST 28  0 - 37 (U/L) HEMOLYSIS AT THIS LEVEL MAY AFFECT RESULT   ALT 20  0 - 53 (U/L) HEMOLYSIS AT THIS LEVEL MAY AFFECT RESULT   Alkaline Phosphatase 70  39 - 117 (U/L) HEMOLYSIS AT THIS LEVEL MAY AFFECT RESULT   Total Bilirubin 0.3  0.3 - 1.2 (mg/dL)    GFR calc non Af Amer 63 (*) >90 (mL/min)    GFR calc Af Amer 73 (*) >90 (mL/min)   CBC     Status: Abnormal   Collection Time   07/02/11  5:30 AM      Component Value Range Comment   WBC 7.8  4.0 - 10.5 (K/uL)    RBC 4.52  4.22 - 5.81 (MIL/uL)    Hemoglobin 12.8 (*) 13.0 - 17.0 (g/dL)    HCT 09.6 (*) 04.5 - 52.0 (%)    MCV 84.7  78.0 - 100.0 (fL)    MCH 28.3  26.0 - 34.0 (pg)    MCHC 33.4  30.0 - 36.0 (g/dL)    RDW 40.9  81.1 - 91.4 (%)    Platelets 190  150 - 400 (K/uL)   CARDIAC PANEL(CRET KIN+CKTOT+MB+TROPI)     Status: Normal   Collection Time   07/02/11  8:58 AM      Component Value Range Comment   Total CK 110  7 - 232 (U/L)    CK, MB 2.0  0.3 - 4.0 (ng/mL)    Troponin I <0.30  <0.30 (ng/mL)    Relative Index 1.8  0.0 - 2.5    CARDIAC PANEL(CRET KIN+CKTOT+MB+TROPI)     Status: Normal   Collection Time   07/02/11  4:01 PM      Component Value Range Comment   Total CK 101  7 - 232 (U/L)    CK, MB 2.0  0.3 - 4.0 (ng/mL)    Troponin I <0.30  <0.30 (ng/mL)    Relative Index 2.0  0.0 - 2.5      No results found for this or any previous visit (from the past 240 hour(s)).  Ct Head Wo Contrast  07/01/2011  *RADIOLOGY REPORT*  Clinical Data: Syncopal episode, history of dementia  CT HEAD WITHOUT CONTRAST  Technique:  Contiguous axial images were obtained from the base of the skull through the vertex without contrast.  Comparison: 10/01/2010; 12/07/2009; brain MRI - 10/03/2010  Findings:  Redemonstrated diffuse atrophy.  Extensive  periventricular hypodensities compatible microvascular ischemic disease.  Given background parenchymal  abnormalities, there is no discrete evidence of acute large territory infarct. Grossly unchanged lacunar infarcts within the bilateral basal ganglia.  No interparenchymal or extra-axial mass or hemorrhage.  Unchanged size and configuration of the ventricles and basilar cisterns.  Polypoid mucosal thickening within the right frontal sinus.  Remaining paranasal sinuses mastoid air cells are otherwise normal.  Regional soft tissues are normal.  IMPRESSION: 1.  Grossly unchanged atrophy and extensive microvascular ischemic disease without definite acute intracranial process. 2.  Polypoid mucosal thickening within the right frontal sinus. No air fluid levels.  Original Report Authenticated By: Waynard Reeds, [name]   Mr [facility] Wo Contrast  07/02/2011  *RADIOLOGY REPORT*  Clinical Data:  High blood pressure.  Prior infarcts.  Syncope.  No focal weakness. Dementia.  MRI HEAD WITH AND WITHOUT CONTRAST MRA HEAD WITHOUT CONTRAST  Technique: Multiplanar, multiecho pulse sequences of the brain and surrounding structures were obtained according to standard protocol with and without intravenous contrast.  Angiographic images of the Circle of Willis were obtained using MRA technique without intravenous contrast.  Contrast: 18 ml MultiHance.  Comparison02/26/2013 CT.  10/03/2010 MR.  MRI HEAD  Findings: Questionable tiny acute infarcts anterior inferior left frontal lobe?  Remote basal ganglia, thalamic and right pontine infarcts.  Prominent small vessel disease type changes confluent periventricular subcortical region.  No intracranial mass or abnormal enhancement.  Global atrophy without hydrocephalus.  Hypertelorism with mild exophthalmos.  Opacification right frontal sinus with mild mucosal thickening ethmoid sinus air cells and maxillary sinuses.  Mild cervical spondylotic changes of the cervical spine.  Slightly heterogeneous appearance bone marrow unchanged.  Scattered punctate areas of blood breakdown products most  consistent with result of hemorrhagic ischemia.  No other intracranial hemorrhage noted.  IMPRESSION: Questionable tiny acute infarcts anterior inferior left frontal lobe?  Please see above  MRA HEAD  Findings: Anterior circulation without medium or large size vessel significant stenosis or occlusion.  Mild branch vessel irregularity.  Fetal type origin of the posterior cerebral arteries.  Irregular narrowed left vertebral artery.  Right vertebral artery is dominant in size.  Fenestrated  basilar artery with irregularity/narrowing and minimal bulge proximally but without discrete saccular aneurysm.  Stability can be confirmed on follow-up.  Poor delineation of the PICAs.  Narrowing AICAs beyond the proximal aspect.  Poor delineation in the posterior cerebral artery branch vessels suggestive of prominent atherosclerotic changes greater on the right.  IMPRESSION: Intracranial atherosclerotic type changes more notable in the posterior circulation as well as involving branch vessels.  Since prior examination, a minimal bulge of the proximal fenestrated basilar artery is noted.  No discrete saccular aneurysm.  Original Report Authenticated By: Fuller Canada, [name]   Mr [facility] Wo Contrast  07/02/2011  *RADIOLOGY REPORT*  Clinical Data:  High blood pressure.  Prior infarcts.  Syncope.  No focal weakness. Dementia.  MRI HEAD WITH AND WITHOUT CONTRAST MRA HEAD WITHOUT CONTRAST  Technique: Multiplanar, multiecho pulse sequences of the brain and surrounding structures were obtained according to standard protocol with and without intravenous contrast.  Angiographic images of the Circle of Willis were obtained using MRA technique without intravenous contrast.  Contrast: 18 ml MultiHance.  Comparison02/26/2013 CT.  10/03/2010 MR.  MRI HEAD  Findings: Questionable tiny acute infarcts anterior inferior left frontal lobe?  Remote basal ganglia, thalamic and right pontine infarcts.  Prominent small vessel disease type changes  confluent periventricular subcortical region.  No  intracranial mass or abnormal enhancement.  Global atrophy without hydrocephalus.  Hypertelorism with mild exophthalmos.  Opacification right frontal sinus with mild mucosal thickening ethmoid sinus air cells and maxillary sinuses.  Mild cervical spondylotic changes of the cervical spine.  Slightly heterogeneous appearance bone marrow unchanged.  Scattered punctate areas of blood breakdown products most consistent with result of hemorrhagic ischemia.  No other intracranial hemorrhage noted.  IMPRESSION: Questionable tiny acute infarcts anterior inferior left frontal lobe?  Please see above  MRA HEAD  Findings: Anterior circulation without medium or large size vessel significant stenosis or occlusion.  Mild branch vessel irregularity.  Fetal type origin of the posterior cerebral arteries.  Irregular narrowed left vertebral artery.  Right vertebral artery is dominant in size.  Fenestrated  basilar artery with irregularity/narrowing and minimal bulge proximally but without discrete saccular aneurysm.  Stability can be confirmed on follow-up.  Poor delineation of the PICAs.  Narrowing AICAs beyond the proximal aspect.  Poor delineation in the posterior cerebral artery branch vessels suggestive of prominent atherosclerotic changes greater on the right.  IMPRESSION: Intracranial atherosclerotic type changes more notable in the posterior circulation as well as involving branch vessels.  Since prior examination, a minimal bulge of the proximal fenestrated basilar artery is noted.  No discrete saccular aneurysm.  Original Report Authenticated By: Fuller Canada, [name]     Assessment/Plan: 78 year old black male with a past medical history of stroke, dementia, hyperlipidemia, hypertension and obstructive sleep apnea admitted via the ER for possible seizure activity. MRI shows possible small acute infarcts. The patient is on daily Plavix and aspirin 1. We'll get CT  angiogram head and neck 2. We'll get 2-D echo 3. Will follow with you  Darla Mcdonald Christophe Louis, MD [facility]  07/03/2011, 1:49 AM

## 2011-07-03 NOTE — Progress Notes (Signed)
VASCULAR LAB PRELIMINARY  PRELIMINARY  PRELIMINARY  PRELIMINARY  Carotid Dopplers completed.    Preliminary report:  There is no ICA stenosis.  Vertebral artery flow is antegrade.  Sherren Kerns Butler, 07/03/2011, 12:21 PM

## 2011-07-03 NOTE — Progress Notes (Signed)
Utilization Review Completed.  Mike Williams  07/03/2011  

## 2011-07-04 ENCOUNTER — Encounter (HOSPITAL_COMMUNITY): Payer: Self-pay | Admitting: Physician Assistant

## 2011-07-04 ENCOUNTER — Other Ambulatory Visit: Payer: Self-pay

## 2011-07-04 DIAGNOSIS — F0390 Unspecified dementia without behavioral disturbance: Secondary | ICD-10-CM | POA: Diagnosis not present

## 2011-07-04 DIAGNOSIS — I498 Other specified cardiac arrhythmias: Secondary | ICD-10-CM

## 2011-07-04 DIAGNOSIS — G40309 Generalized idiopathic epilepsy and epileptic syndromes, not intractable, without status epilepticus: Secondary | ICD-10-CM | POA: Diagnosis not present

## 2011-07-04 DIAGNOSIS — I359 Nonrheumatic aortic valve disorder, unspecified: Secondary | ICD-10-CM

## 2011-07-04 DIAGNOSIS — R001 Bradycardia, unspecified: Secondary | ICD-10-CM | POA: Diagnosis not present

## 2011-07-04 DIAGNOSIS — I6330 Cerebral infarction due to thrombosis of unspecified cerebral artery: Secondary | ICD-10-CM | POA: Diagnosis not present

## 2011-07-04 DIAGNOSIS — E870 Hyperosmolality and hypernatremia: Secondary | ICD-10-CM | POA: Diagnosis not present

## 2011-07-04 DIAGNOSIS — I351 Nonrheumatic aortic (valve) insufficiency: Secondary | ICD-10-CM

## 2011-07-04 DIAGNOSIS — R55 Syncope and collapse: Principal | ICD-10-CM

## 2011-07-04 DIAGNOSIS — I10 Essential (primary) hypertension: Secondary | ICD-10-CM | POA: Diagnosis not present

## 2011-07-04 DIAGNOSIS — N390 Urinary tract infection, site not specified: Secondary | ICD-10-CM | POA: Diagnosis not present

## 2011-07-04 DIAGNOSIS — N181 Chronic kidney disease, stage 1: Secondary | ICD-10-CM | POA: Diagnosis not present

## 2011-07-04 HISTORY — DX: Nonrheumatic aortic (valve) insufficiency: I35.1

## 2011-07-04 LAB — CARDIAC PANEL(CRET KIN+CKTOT+MB+TROPI)
CK, MB: 1.8 ng/mL (ref 0.3–4.0)
Total CK: 95 U/L (ref 7–232)

## 2011-07-04 LAB — BASIC METABOLIC PANEL
Calcium: 9.4 mg/dL (ref 8.4–10.5)
GFR calc non Af Amer: 78 mL/min — ABNORMAL LOW (ref >90)
Glucose, Bld: 89 mg/dL (ref 70–99)
Sodium: 139 mEq/L (ref 135–145)

## 2011-07-04 MED ORDER — AMLODIPINE BESYLATE 10 MG PO TABS: 10.0000 mg | ORAL_TABLET | Freq: Every day | ORAL | Status: DC

## 2011-07-04 MED ORDER — HYDRALAZINE HCL 25 MG PO TABS
25.0000 mg | ORAL_TABLET | Freq: Three times a day (TID) | ORAL | Status: DC
  Administered 2011-07-04 – 2011-07-05 (×5): 25 mg via ORAL
  Filled 2011-07-04 (×8): qty 1

## 2011-07-04 NOTE — Progress Notes (Signed)
07/04/2011 Nsg 0825  HR dropped down to 39 this am and is staying in 40-50's.  Also had a 1.99 sec. Pause.  Pt. resting in bed sleep, asymptomatic, VS 97.6, 43,18 175/93 & 99% r/a.  Dr. Sharl Ma texted paged, will await response.   Forbes Cellar, RN

## 2011-07-04 NOTE — Progress Notes (Signed)
Stroke Team Progress Note  HISTORY Mike Williams is an 78 y.o. male black admitted via ER for fall. Pt has dementia and does not recall why he came to tht hospital. Some of the history is coming from charting for this reason.  The chart the patient was in his kitchen when his wife walked in and found him shaking. He apparently had a loss of consciousness for about 10 minutes. The patient tells me that he is feeling fine now and everything is okay. Is No further information  SUBJECTIVE No family is at the bedside. Overall he feels his condition is rapidly improving. The patient reports no discomfort, no headache, numbness, or weakness.  OBJECTIVE Most recent Vital Signs: Temp: 97.6 F (36.4 C) (03/01 0750) Temp src: Oral (03/01 0750) BP: 183/91 mmHg (03/01 1027) Pulse Rate: 58  (03/01 1027) Respiratory Rate: 18 O2 Saturation: 99%  CBG (last 3)   Basename 07/01/11 2009  GLUCAP 181*   Intake/Output from previous day: 02/28 0701 - 03/01 0700 In: 2446 [P.O.:600; I.V.:1846] Out: 2325 [Urine:2325]  IV Fluid Intake:      . sodium chloride 20 mL/hr at 07/03/11 1827   Medications     . aspirin  81 mg Oral Daily  . clopidogrel  75 mg Oral Daily  . darifenacin  7.5 mg Oral Daily  . docusate sodium  100 mg Oral BID  . enoxaparin  40 mg Subcutaneous Q24H  . felodipine  10 mg Oral Daily  . hydrALAZINE  25 mg Oral Q8H  . hydrochlorothiazide  12.5 mg Oral Daily  . levETIRAcetam  250 mg Oral BID  . olmesartan  40 mg Oral Daily  . sertraline  50 mg Oral QHS  . sodium chloride  3 mL Intravenous Q12H  . DISCONTD: amLODipine  10 mg Oral Daily  . DISCONTD: felodipine  5 mg Oral Daily  . DISCONTD: rivastigmine  4.6 mg Transdermal BH-q7a  PRN acetaminophen, acetaminophen, ondansetron (ZOFRAN) IV, ondansetron  Diet:  Cardiac  Thin liquids Activity: Up with assistance DVT Prophylaxis:  Lovenox  Significant Diagnostic Studies: CBC    Component Value Date/Time   WBC 7.8 07/02/2011 0530     RBC 4.52 07/02/2011 0530   HGB 12.8* 07/02/2011 0530   HCT 38.3* 07/02/2011 0530   PLT 190 07/02/2011 0530   MCV 84.7 07/02/2011 0530   MCH 28.3 07/02/2011 0530   MCHC 33.4 07/02/2011 0530   RDW 14.0 07/02/2011 0530   LYMPHSABS 1.3 07/01/2011 2010   MONOABS 0.5 07/01/2011 2010   EOSABS 0.0 07/01/2011 2010   BASOSABS 0.0 07/01/2011 2010   CMP    Component Value Date/Time   NA 139 07/04/2011 0500   K 3.6 07/04/2011 0500   CL 103 07/04/2011 0500   CO2 26 07/04/2011 0500   GLUCOSE 89 07/04/2011 0500   BUN 15 07/04/2011 0500   CREATININE 0.99 07/04/2011 0500   CALCIUM 9.4 07/04/2011 0500   PROT 7.0 07/02/2011 0530   ALBUMIN 3.1* 07/02/2011 0530   AST 28 07/02/2011 0530   ALT 20 07/02/2011 0530   ALKPHOS 70 07/02/2011 0530   BILITOT 0.3 07/02/2011 0530   GFRNONAA 78* 07/04/2011 0500   GFRAA 90* 07/04/2011 0500   COAGS Lab Results  Component Value Date   INR 1.05 10/01/2010   Lipid Panel    Component Value Date/Time   CHOL 168 07/02/2011 0014   TRIG 215* 07/02/2011 0014   HDL 54 07/02/2011 0014   CHOLHDL 3.1 07/02/2011 0014   VLDL 43*  07/02/2011 0014   LDLCALC 71 07/02/2011 0014   HgbA1C  Lab Results  Component Value Date   HGBA1C  Value: 5.7 (NOTE)                                                                       According to the ADA Clinical Practice Recommendations for 2011, when HbA1c is used as a screening test:   >=6.5%   Diagnostic of Diabetes Mellitus           (if abnormal result  is confirmed)  5.7-6.4%   Increased risk of developing Diabetes Mellitus  References:Diagnosis and Classification of Diabetes Mellitus,Diabetes Care,2011,34(Suppl 1):S62-S69 and Standards of Medical Care in         Diabetes - 2011,Diabetes Care,2011,34  (Suppl 1):S11-S61.* 10/02/2010   Urine Drug Screen  No results found for this basename: labopia,  cocainscrnur,  labbenz,  amphetmu,  thcu,  labbarb    Alcohol Level No results found for this basename: eth     Results for orders placed during the hospital encounter of  07/01/11 (from the past 24 hour(s))  BASIC METABOLIC PANEL     Status: Abnormal   Collection Time   07/04/11  5:00 AM      Component Value Range   Sodium 139  135 - 145 (mEq/L)   Potassium 3.6  3.5 - 5.1 (mEq/L)   Chloride 103  96 - 112 (mEq/L)   CO2 26  19 - 32 (mEq/L)   Glucose, Bld 89  70 - 99 (mg/dL)   BUN 15  6 - 23 (mg/dL)   Creatinine, Ser 1.61  0.50 - 1.35 (mg/dL)   Calcium 9.4  8.4 - 09.6 (mg/dL)   GFR calc non Af Amer 78 (*) >90 (mL/min)   GFR calc Af Amer 90 (*) >90 (mL/min)    Ct Angio Head W/cm &/or Wo Cm  07/03/2011  *RADIOLOGY REPORT*  Clinical Data:  High blood pressure.  Prior infarcts.  Episode of syncope.  CT ANGIOGRAPHY HEAD AND NECK  Technique:  Multidetector CT imaging of the head and neck was performed using the standard protocol during bolus administration of intravenous contrast.  Multiplanar CT image reconstructions including MIPs were obtained to evaluate the vascular anatomy. Carotid stenosis measurements (when applicable) are obtained utilizing NASCET criteria, using the distal internal carotid diameter as the denominator.  Contrast: 50mL OMNIPAQUE IOHEXOL 350 MG/ML IV SOLN  Comparison:  MR brain and MRA angiogram circle of Aide [facility] 07/02/2011. 12/09/2009 CT chest  CTA NECK  Findings:  Fusiform dilation of the aortic arch with aneurysm projecting anterior inferior left lateral aspect of the proximal aortic arch appears relatively similar to the prior exam.  Normal sized mediastinal lymph nodes.  Diminutive size left vertebral artery arises directly from the aortic arch.  This is appears to be congenitally small given the caliber of the transverse foramen. Mild to moderate narrowing proximal left vertebral artery as it arises from the aortic arch.  Common origin of the innominate artery and left common carotid artery.  Prominent fold with mild narrowing proximal right subclavian artery.  Calcified plaque right subclavian artery just proximal to the takeoff of the right  vertebral artery.  Very mild narrowing of the origin of the right  vertebral artery.  Ectatic right common carotid artery.  Minimal plaque proximal right internal carotid artery without significant stenosis. Fold with mild narrowing and irregularity of the right internal carotid artery at the C1 level.  Subtle fibromuscular dysplasia not entirely excluded.  Ectatic left common carotid artery.  Ectatic proximal left internal carotid artery without hemodynamically significant stenosis.  At the C1 level, there is a focal dilation of the left internal carotid artery measuring up to 8 mm whereas just proximal to this level left internal carotid artery measures 5.2 mm.  No pleating to suggest fibromuscular dysplasia.  Cervical spondylotic changes.  Heterogeneous thyroid gland with nodularity on the right measuring up to 1.1 cm.  This can be followed with thyroid ultrasound.  Scattered increased number of neck lymph nodes.  These do not appear necrotic.  Etiology indeterminate.  Small laryngocele incidentally noted.   Review of the MIP images confirms the above findings.  IMPRESSION: Fusiform dilation of the aortic arch with aneurysm projecting anterior inferior left lateral aspect of the proximal aortic arch appears relatively similar to the prior exam.  Normal sized mediastinal lymph nodes.  Diminutive size left vertebral artery arises directly from the aortic arch.   Mild to moderate narrowing proximal left vertebral artery as it arises from the aortic arch.  Very mild narrowing of the origin of the right vertebral artery.  Minimal plaque proximal right internal carotid artery without significant stenosis. Fold with mild narrowing and irregularity of the right internal carotid artery at the C1 level.  Subtle fibromuscular dysplasia not entirely excluded.  Ectatic proximal left internal carotid artery without hemodynamically significant stenosis.  At the C1 level, there is a focal dilation of the left internal carotid  artery measuring up to 8 mm whereas just proximal to this level left internal carotid artery measures 5.2 mm.  Heterogeneous thyroid gland with nodularity on the right measuring up to 1.1 cm.  This can be followed with thyroid ultrasound.  Scattered increased number of neck lymph nodes.  Largest lymph node deep to the left sternocleidomastoid muscle measures 1.2 x 0.7 cm maximal transverse dimension spanning over 1.7 cm.  These do not appear necrotic.  Etiology indeterminate.  CTA HEAD  Findings:  No intracranial hemorrhage. Remote basal ganglia, thalamic and right pontine infarcts.  Prominent small vessel disease type changes. The questioned tiny acute infarcts anterior inferior left frontal lobe are not appreciated by CT.  No hydrocephalus.  Global atrophy.  Lucency surrounds the upper posterior molar.  Polypoid opacification right frontal - ethmoid sinus junction.  No intracranial enhancing lesion.  Minimal calcified plaque cavernous segment internal carotid artery bilaterally.  Anterior circulation without medium or large size vessel significant stenosis or occlusion.  Anterior cerebral artery and middle cerebral artery branch vessel irregularity.  Minimal bulges of the superior margin of the M1 segment of the right middle cerebral artery, A1 segment of the left anterior cerebral artery and at the anterior communicating artery region have an appearance more suggestive of vessels or origin of vessels rather than aneurysm.  Fetal type origin of the posterior cerebral arteries bilaterally.  Irregular narrowed left vertebral artery.  Fenestrated basilar artery.  Minimal irregularity and bulge on the left without discrete saccular aneurysm.   Review of the MIP images confirms the above findings.  IMPRESSION: Intracranial atherosclerotic type changes as detailed above.  Fenestrated appearance of the basilar artery with minimal bulge but without discrete saccular aneurysm.  Remote infarcts and small vessel disease type  changes. MR questioned  tiny acute left frontal lobe infarct not appreciated by CT.  Original Report Authenticated By: Fuller Canada, [name]   Ct Angio Neck W/cm &/or Wo/cm  07/03/2011  *RADIOLOGY REPORT*  Clinical Data:  High blood pressure.  Prior infarcts.  Episode of syncope.  CT ANGIOGRAPHY HEAD AND NECK  Technique:  Multidetector CT imaging of the head and neck was performed using the standard protocol during bolus administration of intravenous contrast.  Multiplanar CT image reconstructions including MIPs were obtained to evaluate the vascular anatomy. Carotid stenosis measurements (when applicable) are obtained utilizing NASCET criteria, using the distal internal carotid diameter as the denominator.  Contrast: 50mL OMNIPAQUE IOHEXOL 350 MG/ML IV SOLN  Comparison:  MR brain and MRA angiogram circle of [facility] 07/02/2011. 12/09/2009 CT chest  CTA NECK  Findings:  Fusiform dilation of the aortic arch with aneurysm projecting anterior inferior left lateral aspect of the proximal aortic arch appears relatively similar to the prior exam.  Normal sized mediastinal lymph nodes.  Diminutive size left vertebral artery arises directly from the aortic arch.  This is appears to be congenitally small given the caliber of the transverse foramen. Mild to moderate narrowing proximal left vertebral artery as it arises from the aortic arch.  Common origin of the innominate artery and left common carotid artery.  Prominent fold with mild narrowing proximal right subclavian artery.  Calcified plaque right subclavian artery just proximal to the takeoff of the right vertebral artery.  Very mild narrowing of the origin of the right vertebral artery.  Ectatic right common carotid artery.  Minimal plaque proximal right internal carotid artery without significant stenosis. Fold with mild narrowing and irregularity of the right internal carotid artery at the C1 level.  Subtle fibromuscular dysplasia not entirely excluded.  Ectatic  left common carotid artery.  Ectatic proximal left internal carotid artery without hemodynamically significant stenosis.  At the C1 level, there is a focal dilation of the left internal carotid artery measuring up to 8 mm whereas just proximal to this level left internal carotid artery measures 5.2 mm.  No pleating to suggest fibromuscular dysplasia.  Cervical spondylotic changes.  Heterogeneous thyroid gland with nodularity on the right measuring up to 1.1 cm.  This can be followed with thyroid ultrasound.  Scattered increased number of neck lymph nodes.  These do not appear necrotic.  Etiology indeterminate.  Small laryngocele incidentally noted.   Review of the MIP images confirms the above findings.  IMPRESSION: Fusiform dilation of the aortic arch with aneurysm projecting anterior inferior left lateral aspect of the proximal aortic arch appears relatively similar to the prior exam.  Normal sized mediastinal lymph nodes.  Diminutive size left vertebral artery arises directly from the aortic arch.   Mild to moderate narrowing proximal left vertebral artery as it arises from the aortic arch.  Very mild narrowing of the origin of the right vertebral artery.  Minimal plaque proximal right internal carotid artery without significant stenosis. Fold with mild narrowing and irregularity of the right internal carotid artery at the C1 level.  Subtle fibromuscular dysplasia not entirely excluded.  Ectatic proximal left internal carotid artery without hemodynamically significant stenosis.  At the C1 level, there is a focal dilation of the left internal carotid artery measuring up to 8 mm whereas just proximal to this level left internal carotid artery measures 5.2 mm.  Heterogeneous thyroid gland with nodularity on the right measuring up to 1.1 cm.  This can be followed with thyroid ultrasound.  Scattered increased number  of neck lymph nodes.  Largest lymph node deep to the left sternocleidomastoid muscle measures 1.2 x 0.7  cm maximal transverse dimension spanning over 1.7 cm.  These do not appear necrotic.  Etiology indeterminate.  CTA HEAD  Findings:  No intracranial hemorrhage. Remote basal ganglia, thalamic and right pontine infarcts.  Prominent small vessel disease type changes. The questioned tiny acute infarcts anterior inferior left frontal lobe are not appreciated by CT.  No hydrocephalus.  Global atrophy.  Lucency surrounds the upper posterior molar.  Polypoid opacification right frontal - ethmoid sinus junction.  No intracranial enhancing lesion.  Minimal calcified plaque cavernous segment internal carotid artery bilaterally.  Anterior circulation without medium or large size vessel significant stenosis or occlusion.  Anterior cerebral artery and middle cerebral artery branch vessel irregularity.  Minimal bulges of the superior margin of the M1 segment of the right middle cerebral artery, A1 segment of the left anterior cerebral artery and at the anterior communicating artery region have an appearance more suggestive of vessels or origin of vessels rather than aneurysm.  Fetal type origin of the posterior cerebral arteries bilaterally.  Irregular narrowed left vertebral artery.  Fenestrated basilar artery.  Minimal irregularity and bulge on the left without discrete saccular aneurysm.   Review of the MIP images confirms the above findings.  IMPRESSION: Intracranial atherosclerotic type changes as detailed above.  Fenestrated appearance of the basilar artery with minimal bulge but without discrete saccular aneurysm.  Remote infarcts and small vessel disease type changes. MR questioned tiny acute left frontal lobe infarct not appreciated by CT.  Original Report Authenticated By: Fuller Canada, [name]    CT of the brain  IMPRESSION:  1. Grossly unchanged atrophy and extensive microvascular ischemic  disease without definite acute intracranial process.  2. Polypoid mucosal thickening within the right frontal sinus. No    air fluid levels.   CT angio  not ordered  MRI of the brain   IMPRESSION:  Questionable tiny acute infarcts anterior inferior left frontal  lobe?  MRA of the brain IMPRESSION:  Intracranial atherosclerotic type changes more notable in the  posterior circulation as well as involving branch vessels.  Since prior examination, a minimal bulge of the proximal  fenestrated basilar artery is noted. No discrete saccular  aneurysm.   2D Echocardiogram  Study Conclusions  - Left ventricle: Systolic function was normal. The estimated ejection fraction was in the range of 55% to 60%. - Aortic valve: Moderate regurgitation. - Aorta: The aorta was moderately dilated. Transthoracic echocardiography.  Carotid Doppler   Carotid Dopplers completed.  Preliminary report: There is no ICA stenosis. Vertebral artery flow is antegrade.   CXR  not ordered  EKG  normal sinus rhythm, first degree AV block, probable old inferior infarct.   EEG report:  IMPRESSION: This was an abnormal awake and drowsy EEG due to the  presence of mild diffuse background slowing. The finding may be  suggestive of mild cerebral dysfunction and may be due to toxic  metabolic encephalopathy, neurodegenerative disorder and/or  bihemispheric structure abnormality. Clinical correlation is suggested.     Physical Exam  The patient is alert and cooperative. The patient is oriented to person and place, not date.  Neurologic exam reveals full extraocular movements, speech is normal. Visual fields are full.  Motor testing reveals good strength of all four extremities. No drift is seen with the upper extremities.  The patient has good finger-nose-finger and heel-to-shin bilaterally. Gait was not tested.  Deep tendon reflexes  are symmetric and normal. Toes are down going bilaterally.    ASSESSMENT Mr. Mike Williams is a 78 y.o. male with a small left frontal infarct (2), secondary to possible embolus. On aspirin 325  mg orally every day and clopidogrel 75 mg orally every day for secondary stroke prevention.  Stroke risk factors:  hyperlipidemia and hypertension  Hospital day # 3  TREATMENT/PLAN Continue aspirin 325 mg orally every day and clopidogrel 75 mg orally every day for secondary stroke prevention.   The patient has been on aspirin and Plavix prior to admission. The patient has 2 small left frontal diffusion weighted positive lesions that may represent acute strokes. By history, the patient presented with a seizure event that may be associated with his underlying dementia. The patient has significant dementia prior to admission. The patient is followed through our office. The patient is on low-dose Exelon, which can lower the seizure threshold, and may cause bradycardia. At this point, I will add low-dose Keppra to his regimen. An EEG study showed mild diffuse background slowing, no epileptiform activity.  The full dose aspirin could be reduced to an 81 mg tablet.  The Exelon patch will be discontinued secondary to bradycardia.  Possible discharge home in the near future if heart rate stabilizes.   Lesly Dukes

## 2011-07-04 NOTE — Progress Notes (Signed)
07/04/2011 Nsg 0830 Returned call from Dr. Sharl Ma, no new orders given.  Will see the pt. When he rounds.  Will continue to monitor.  Forbes Cellar, RN

## 2011-07-04 NOTE — Procedures (Signed)
EEG ID:  [id number].  HISTORY:  This is a 78 years old man with syncope.  MEDICATIONS:  Keppra.  CONDITION OF RECORDING:  This 16-lead EEG was recorded with the patient in awake and drowsy states.  Background rhythms: background patterns in wakefulness were well organized with a well-sustained posterior dominant rhythm of 7.5 to 8.5 Hz, symmetrical and reactive to eye opening and closing.  Drowsiness was associated with mild attenuation of voltage and slowing of frequencies.  Abnormal potentials: no epileptiform activity or focal slowing was noted.  ACTIVATION PROCEDURES:  Hyperventilation was not performed.  Photic stimulation was not performed.  EKG:  Single-channel of EKG monitoring was unremarkable.  IMPRESSION:  This was an abnormal awake and drowsy EEG due to  presence of mild diffuse background slowing. This finding may be suggestive of mild cerebral dysfunction and may be due to toxic metabolic encephalopathy, neurodegenerative disorder and/or bi-hemispheric structure abnormality.  Clinical correlation is suggested.          ______________________________ Carmell Austria, MD    NF:AOZH D:  07/03/2011 17:44:31  T:  07/03/2011 23:39:27  Job #:  [id number]

## 2011-07-04 NOTE — Progress Notes (Signed)
Subjective: Patient seen and examined, admitted with questionable syncope versus seizure. EEG was done today, result is pending. Neuro has seen the patient and started low dose keppra. Objective: Vital signs in last 24 hours: Temp:  [97.6 F (36.4 C)-98.9 F (37.2 C)] 97.6 F (36.4 C) (03/01 0750) Pulse Rate:  [43-78] 43  (03/01 0750) Resp:  [18-20] 18  (03/01 0750) BP: (161-177)/(86-93) 175/93 mmHg (03/01 0750) SpO2:  [94 %-99 %] 99 % (03/01 0750) Weight change:  Last BM Date: 07/01/11  Intake/Output from previous day: 02/28 0701 - 03/01 0700 In: 2446 [P.O.:600; I.V.:1846] Out: 2325 [Urine:2325]     Physical Exam: HEENT: Atraumatic, normocephalic Neck: Supple Chest : Clear to auscultation bilaterally, no wheezing, no crackles Heart : S1 S2 Regular, no murmurs Abdomen: Soft, Nontender, no organomegaly Ext : No cyanosis, clubbing, edema   Lab Results: Basic Metabolic Panel:  Basename 07/04/11 0500 07/03/11 0535  NA 139 139  K 3.6 3.7  CL 103 106  CO2 26 27  GLUCOSE 89 86  BUN 15 17  CREATININE 0.99 0.98  CALCIUM 9.4 9.0  MG -- --  PHOS -- --   Liver Function Tests:  Basename 07/02/11 0530  AST 28  ALT 20  ALKPHOS 70  BILITOT 0.3  PROT 7.0  ALBUMIN 3.1*   No results found for this basename: LIPASE:2,AMYLASE:2 in the last 72 hours No results found for this basename: AMMONIA:2 in the last 72 hours CBC:  Basename 07/02/11 0530 07/01/11 2010  WBC 7.8 8.3  NEUTROABS -- 6.5  HGB 12.8* 14.1  HCT 38.3* 43.2  MCV 84.7 85.4  PLT 190 195   Cardiac Enzymes:  Basename 07/02/11 1601 07/02/11 0858 07/02/11 0014  CKTOTAL 101 110 117  CKMB 2.0 2.0 2.0  CKMBINDEX -- -- --  TROPONINI <0.30 <0.30 <0.30   BNP: No results found for this basename: PROBNP:3 in the last 72 hours D-Dimer: No results found for this basename: DDIMER:2 in the last 72 hours CBG:  Basename 07/01/11 2009  GLUCAP 181*   Hemoglobin A1C: No results found for this basename: HGBA1C in  the last 72 hours Fasting Lipid Panel:  Basename 07/02/11 0014  CHOL 168  HDL 54  LDLCALC 71  TRIG 215*  CHOLHDL 3.1  LDLDIRECT --   Urine Drug Screen: Drugs of Abuse  No results found for this basename: labopia,  cocainscrnur,  labbenz,  amphetmu,  thcu,  labbarb    Alcohol Level: No results found for this basename: ETH:2 in the last 72 hours Urinalysis:  Basename 07/01/11 2113  COLORURINE YELLOW  LABSPEC 1.027  PHURINE 6.0  GLUCOSEU NEGATIVE  HGBUR SMALL*  BILIRUBINUR NEGATIVE  KETONESUR NEGATIVE  PROTEINUR NEGATIVE  UROBILINOGEN 1.0  NITRITE NEGATIVE  LEUKOCYTESUR MODERATE*   Misc. Labs:  No results found for this or any previous visit (from the past 240 hour(s)).  Studies/Results: Ct Head Wo Contrast  07/01/2011  *RADIOLOGY REPORT*  Clinical Data: Syncopal episode, history of dementia  CT HEAD WITHOUT CONTRAST  Technique:  Contiguous axial images were obtained from the base of the skull through the vertex without contrast.  Comparison: 10/01/2010; 12/07/2009; brain MRI - 10/03/2010  Findings:  Redemonstrated diffuse atrophy.  Extensive periventricular hypodensities compatible microvascular ischemic disease.  Given background parenchymal abnormalities, there is no discrete evidence of acute large territory infarct. Grossly unchanged lacunar infarcts within the bilateral basal ganglia.  No interparenchymal or extra-axial mass or hemorrhage.  Unchanged size and configuration of the ventricles and basilar cisterns.  Polypoid  mucosal thickening within the right frontal sinus.  Remaining paranasal sinuses mastoid air cells are otherwise normal.  Regional soft tissues are normal.  IMPRESSION: 1.  Grossly unchanged atrophy and extensive microvascular ischemic disease without definite acute intracranial process. 2.  Polypoid mucosal thickening within the right frontal sinus. No air fluid levels.  Original Report Authenticated By: Waynard Reeds, [name]   Mr [facility] Wo  Contrast  07/02/2011  *RADIOLOGY REPORT*  Clinical Data:  High blood pressure.  Prior infarcts.  Syncope.  No focal weakness. Dementia.  MRI HEAD WITH AND WITHOUT CONTRAST MRA HEAD WITHOUT CONTRAST  Technique: Multiplanar, multiecho pulse sequences of the brain and surrounding structures were obtained according to standard protocol with and without intravenous contrast.  Angiographic images of the Circle of Willis were obtained using MRA technique without intravenous contrast.  Contrast: 18 ml MultiHance.  Comparison02/26/2013 CT.  10/03/2010 MR.  MRI HEAD  Findings: Questionable tiny acute infarcts anterior inferior left frontal lobe?  Remote basal ganglia, thalamic and right pontine infarcts.  Prominent small vessel disease type changes confluent periventricular subcortical region.  No intracranial mass or abnormal enhancement.  Global atrophy without hydrocephalus.  Hypertelorism with mild exophthalmos.  Opacification right frontal sinus with mild mucosal thickening ethmoid sinus air cells and maxillary sinuses.  Mild cervical spondylotic changes of the cervical spine.  Slightly heterogeneous appearance bone marrow unchanged.  Scattered punctate areas of blood breakdown products most consistent with result of hemorrhagic ischemia.  No other intracranial hemorrhage noted.  IMPRESSION: Questionable tiny acute infarcts anterior inferior left frontal lobe?  Please see above  MRA HEAD  Findings: Anterior circulation without medium or large size vessel significant stenosis or occlusion.  Mild branch vessel irregularity.  Fetal type origin of the posterior cerebral arteries.  Irregular narrowed left vertebral artery.  Right vertebral artery is dominant in size.  Fenestrated  basilar artery with irregularity/narrowing and minimal bulge proximally but without discrete saccular aneurysm.  Stability can be confirmed on follow-up.  Poor delineation of the PICAs.  Narrowing AICAs beyond the proximal aspect.  Poor delineation  in the posterior cerebral artery branch vessels suggestive of prominent atherosclerotic changes greater on the right.  IMPRESSION: Intracranial atherosclerotic type changes more notable in the posterior circulation as well as involving branch vessels.  Since prior examination, a minimal bulge of the proximal fenestrated basilar artery is noted.  No discrete saccular aneurysm.  Original Report Authenticated By: Fuller Canada, [name]   Mr [facility] Wo Contrast  07/02/2011  *RADIOLOGY REPORT*  Clinical Data:  High blood pressure.  Prior infarcts.  Syncope.  No focal weakness. Dementia.  MRI HEAD WITH AND WITHOUT CONTRAST MRA HEAD WITHOUT CONTRAST  Technique: Multiplanar, multiecho pulse sequences of the brain and surrounding structures were obtained according to standard protocol with and without intravenous contrast.  Angiographic images of the Circle of Willis were obtained using MRA technique without intravenous contrast.  Contrast: 18 ml MultiHance.  Comparison02/26/2013 CT.  10/03/2010 MR.  MRI HEAD  Findings: Questionable tiny acute infarcts anterior inferior left frontal lobe?  Remote basal ganglia, thalamic and right pontine infarcts.  Prominent small vessel disease type changes confluent periventricular subcortical region.  No intracranial mass or abnormal enhancement.  Global atrophy without hydrocephalus.  Hypertelorism with mild exophthalmos.  Opacification right frontal sinus with mild mucosal thickening ethmoid sinus air cells and maxillary sinuses.  Mild cervical spondylotic changes of the cervical spine.  Slightly heterogeneous appearance bone marrow unchanged.  Scattered punctate areas of  blood breakdown products most consistent with result of hemorrhagic ischemia.  No other intracranial hemorrhage noted.  IMPRESSION: Questionable tiny acute infarcts anterior inferior left frontal lobe?  Please see above  MRA HEAD  Findings: Anterior circulation without medium or large size vessel significant stenosis  or occlusion.  Mild branch vessel irregularity.  Fetal type origin of the posterior cerebral arteries.  Irregular narrowed left vertebral artery.  Right vertebral artery is dominant in size.  Fenestrated  basilar artery with irregularity/narrowing and minimal bulge proximally but without discrete saccular aneurysm.  Stability can be confirmed on follow-up.  Poor delineation of the PICAs.  Narrowing AICAs beyond the proximal aspect.  Poor delineation in the posterior cerebral artery branch vessels suggestive of prominent atherosclerotic changes greater on the right.  IMPRESSION: Intracranial atherosclerotic type changes more notable in the posterior circulation as well as involving branch vessels.  Since prior examination, a minimal bulge of the proximal fenestrated basilar artery is noted.  No discrete saccular aneurysm.  Original Report Authenticated By: Fuller Canada, [name]    Medications: Scheduled Meds:    . amLODipine  10 mg Oral Daily  . aspirin  81 mg Oral Daily  . clopidogrel  75 mg Oral Daily  . darifenacin  7.5 mg Oral Daily  . docusate sodium  100 mg Oral BID  . enoxaparin  40 mg Subcutaneous Q24H  . felodipine  10 mg Oral Daily  . hydrochlorothiazide  12.5 mg Oral Daily  . levETIRAcetam  250 mg Oral BID  . olmesartan  40 mg Oral Daily  . sertraline  50 mg Oral QHS  . sodium chloride  3 mL Intravenous Q12H  . DISCONTD: aspirin EC  325 mg Oral Daily  . DISCONTD: felodipine  5 mg Oral Daily  . DISCONTD: felodipine  5 mg Oral Daily  . DISCONTD: rivastigmine  4.6 mg Transdermal [id number]   Continuous Infusions:    . sodium chloride 20 mL/hr at 07/03/11 1827   PRN Meds:.acetaminophen, acetaminophen, iohexol, ondansetron (ZOFRAN) IV, ondansetron  Assessment/Plan:  Bradycardia He is on Exelon, which can cause bradycardia Discussed with Dr Anne Hahn, will d/c exelon Get EKG Cardiology consult  ? Seizure She has had multiple admissions with questionable syncopal versus seizure,   EEG done result is pending.  Patient started on Keppra 250 mg po Bid.   DEMENTIA Stable   HYPERTENSION Continue felodipine Continue Benicar, HCTZ Add Hydralazine 25 mg Po q 8hr.   Hypernatremia: Resolved. Will cut down iv fluids to kvo   LOS: 3 days   [facility] Pager: [phone] 07/04/2011, 8:40 AM

## 2011-07-04 NOTE — Consult Note (Signed)
CARDIOLOGY CONSULT NOTE  Patient ID: Mike Williams, MRN: [id number], DOB/AGE: 1932/07/12 78 y.o. Admit date: 07/01/2011 Date of Consult: 07/04/2011  Primary Physician: [name], MD, MD Primary Cardiologist: New to [facility]  Chief Complaint: loss of consciousness  HPI: 78 y/o M with hx of CVAx2, dementia, aortic root dilitation presented to [facility] having lost consciousness after a shaking episode. Per his wife, he was standing at the sink washing up. She noticed the water was running longer than usual so went in to check on him, but he was clammy and shaking. She helped him to the floor but he wouldn't talk and was "stiff as a board." She called 911 and while on the phone when she went to turn around to look at him, his head was rolled back and she could see the whites of his eyes. This lasted 2 minutes but by the time EMS arrived, he was talking again. Per report, BP and CBG were normal when EMS arrived. Sodium was slightly elevated at 146, Cr was 1.38. CT head was negative for acute infarct, but MRI showed questionable tiny acute infarcts anterior inferior left frontal lobe, with MRA/CTA demonstrating atherosclerotic changes. 2D echo reveals normal EF, mod AR, and moderately dilated aorta. An EEG study showed mild diffuse background slowing, no epileptiform activity. Neurology is on board and feel his MRI findings may infact represent acute strokes. They also started him on Keppra for possible seizures. He has not had any prior cardiac evaluation. He denies CP or SOB, and he has not had any syncope before. He has had a few falls secondary to balance problems, 2 within the last year.  Telemetry thus far reveals occasional sinus bradycardia down into the upper 30's, mid 40's nonsustained, mostly hovering in the 50's. The longest pause was 2 seconds. There was a question of wide-complex beats but this appears to be artifact as in another lead he has very clear QRS complexes  that march out with his prior rhythm. Because of his bradycardia, neurology has discontinued Exelon this afternoon.  Past Medical History  Diagnosis Date  . CEREBROVASCULAR ACCIDENT, HX OF 02/09/2007  . DEMENTIA 05/12/2007  . HYPERGLYCEMIA 09/21/2009  . HYPERTENSION 12/29/2006  . OBSTRUCTIVE SLEEP APNEA 12/29/2006  . PROSTATE CANCER, HX OF 12/29/2006  . Hemorrhoids   . Lactose intolerance   . Risk for falls   . Prostatitis   . Aortic root dilatation     Noted on 09/2010 echo, with mild-mod AI     2D Echo Study Conclusions 09/2010  - Left ventricle: The cavity size was normal. Wall thickness was normal. Systolic function was normal. The estimated ejection fraction was in the range of 55% to 60%. Wall motion was normal; there were no regional wall motion abnormalities. Doppler parameters are consistent with abnormal left ventricular relaxation (grade 1 diastolic dysfunction).  - Aortic valve: Mild to moderate regurgitation. - Aortic root: The aortic root was moderately dilated. - Mitral valve: Mild regurgitation. Impressions: - Aortic root moderately dilated; suggest CTA or MRA to better  assess.  Surgical History:  Past Surgical History  Procedure Date  . Cholecystectomy   . Prostate surgery     cryotherapy  . Ganglion cyst excision      Home Meds: Medication Sig  aspirin EC 325 MG tablet Take 325 mg by mouth daily.  clopidogrel (PLAVIX) 75 MG tablet Take 75 mg by mouth daily.   darifenacin (ENABLEX) 7.5 MG 24 hr tablet Take 7.5 mg by mouth  daily.    felodipine (PLENDIL) 5 MG 24 hr tablet Take 5 mg by mouth daily.   olmesartan-hydrochlorothiazide (BENICAR HCT) 40-12.5 MG per tablet Take 1 tablet by mouth daily.   rivastigmine (EXELON) 4.6 mg/24hr Place 1 patch onto the skin every morning.   sertraline (ZOLOFT) 100 MG tablet Take 50 mg by mouth at bedtime.    Inpatient Medications:    . aspirin  81 mg Oral Daily  . clopidogrel  75 mg Oral Daily  . darifenacin  7.5 mg Oral Daily  .  docusate sodium  100 mg Oral BID  . enoxaparin  40 mg Subcutaneous Q24H  . felodipine  10 mg Oral Daily  . hydrALAZINE  25 mg Oral Q8H  . hydrochlorothiazide  12.5 mg Oral Daily  . levETIRAcetam  250 mg Oral BID  . olmesartan  40 mg Oral Daily  . sertraline  50 mg Oral QHS  . sodium chloride  3 mL Intravenous Q12H  . DISCONTD: amLODipine  10 mg Oral Daily  . DISCONTD: felodipine  5 mg Oral Daily  . DISCONTD: rivastigmine  4.6 mg Transdermal BH-[id number]    Allergies: No Known Allergies  History   Social History  . Marital Status: Married    Spouse Name: N/A    Number of Children: N/A  . Years of Education: N/A   Occupational History  . Not on file.   Social History Main Topics  . Smoking status: Never Smoker   . Smokeless tobacco: Not on file  . Alcohol Use:   . Drug Use:   . Sexually Active:    Other Topics Concern  . Not on file   Social History Narrative  . No narrative on file     Family History  Problem Relation Age of Onset  . Heart attack Father   . Heart disease Father   . Cancer Sister      Review of Systems: General: negative for chills, fever, night sweats or weight changes.  Cardiovascular: negative for chest pain, edema, orthopnea, palpitations, paroxysmal nocturnal dyspnea, shortness of breath or dyspnea on exertion Dermatological: negative for rash Respiratory: negative for cough or wheezing Urologic: negative for hematuria Abdominal: negative for nausea, vomiting, diarrhea, bright red blood per rectum, melena, or hematemesis. Good appetite Neurologic: see above All other systems reviewed and are otherwise negative except as noted above.  Labs:  [facility] 07/02/11 1601 07/02/11 0858 07/02/11 0014  CKTOTAL 101 110 117  CKMB 2.0 2.0 2.0  TROPONINI <0.30 <0.30 <0.30   Lab Results  Component Value Date   WBC 7.8 07/02/2011   HGB 12.8* 07/02/2011   HCT 38.3* 07/02/2011   MCV 84.7 07/02/2011   PLT 190 07/02/2011    Lab 07/04/11 0500 07/02/11 0530    NA 139 --  K 3.6 --  CL 103 --  CO2 26 --  BUN 15 --  CREATININE 0.99 --  CALCIUM 9.4 --  PROT -- 7.0  BILITOT -- 0.3  ALKPHOS -- 70  ALT -- 20  AST -- 28  GLUCOSE 89 --   Lab Results  Component Value Date   CHOL 168 07/02/2011   HDL 54 07/02/2011   LDLCALC 71 07/02/2011   TRIG 215* 07/02/2011     Radiology/Studies:   1. 2D echo 07/02/11 Study Conclusions - Left ventricle: Systolic function was normal. The estimated ejection fraction was in the range of 55% to 60%. - Aortic valve: Moderate regurgitation. - Aorta: The aorta was moderately dilated.  2. Ct  Angio Head W/cm &/or Wo Cm 07/03/2011  *RADIOLOGY REPORT*  Clinical Data:  High blood pressure.  Prior infarcts.  Episode of syncope.  CT ANGIOGRAPHY HEAD AND NECK  Technique:  Multidetector CT imaging of the head and neck was performed using the standard protocol during bolus administration of intravenous contrast.  Multiplanar CT image reconstructions including MIPs were obtained to evaluate the vascular anatomy. Carotid stenosis measurements (when applicable) are obtained utilizing NASCET criteria, using the distal internal carotid diameter as the denominator.  Contrast: 50mL OMNIPAQUE IOHEXOL 350 MG/ML IV SOLN  Comparison:  MR brain and MRA angiogram circle of Willis 07/02/2011. 12/09/2009 CT chest  CTA NECK  Findings:  Fusiform dilation of the aortic arch with aneurysm projecting anterior inferior left lateral aspect of the proximal aortic arch appears relatively similar to the prior exam.  Normal sized mediastinal lymph nodes.  Diminutive size left vertebral artery arises directly from the aortic arch.  This is appears to be congenitally small given the caliber of the transverse foramen. Mild to moderate narrowing proximal left vertebral artery as it arises from the aortic arch.  Common origin of the innominate artery and left common carotid artery.  Prominent fold with mild narrowing proximal right subclavian artery.  Calcified plaque  right subclavian artery just proximal to the takeoff of the right vertebral artery.  Very mild narrowing of the origin of the right vertebral artery.  Ectatic right common carotid artery.  Minimal plaque proximal right internal carotid artery without significant stenosis. Fold with mild narrowing and irregularity of the right internal carotid artery at the C1 level.  Subtle fibromuscular dysplasia not entirely excluded.  Ectatic left common carotid artery.  Ectatic proximal left internal carotid artery without hemodynamically significant stenosis.  At the C1 level, there is a focal dilation of the left internal carotid artery measuring up to 8 mm whereas just proximal to this level left internal carotid artery measures 5.2 mm.  No pleating to suggest fibromuscular dysplasia.  Cervical spondylotic changes.  Heterogeneous thyroid gland with nodularity on the right measuring up to 1.1 cm.  This can be followed with thyroid ultrasound.  Scattered increased number of neck lymph nodes.  These do not appear necrotic.  Etiology indeterminate.  Small laryngocele incidentally noted.   Review of the MIP images confirms the above findings.  IMPRESSION: Fusiform dilation of the aortic arch with aneurysm projecting anterior inferior left lateral aspect of the proximal aortic arch appears relatively similar to the prior exam.  Normal sized mediastinal lymph nodes.  Diminutive size left vertebral artery arises directly from the aortic arch.   Mild to moderate narrowing proximal left vertebral artery as it arises from the aortic arch.  Very mild narrowing of the origin of the right vertebral artery.  Minimal plaque proximal right internal carotid artery without significant stenosis. Fold with mild narrowing and irregularity of the right internal carotid artery at the C1 level.  Subtle fibromuscular dysplasia not entirely excluded.  Ectatic proximal left internal carotid artery without hemodynamically significant stenosis.  At the C1  level, there is a focal dilation of the left internal carotid artery measuring up to 8 mm whereas just proximal to this level left internal carotid artery measures 5.2 mm.  Heterogeneous thyroid gland with nodularity on the right measuring up to 1.1 cm.  This can be followed with thyroid ultrasound.  Scattered increased number of neck lymph nodes.  Largest lymph node deep to the left sternocleidomastoid muscle measures 1.2 x 0.7 cm maximal transverse  dimension spanning over 1.7 cm.  These do not appear necrotic.  Etiology indeterminate.  CTA HEAD  Findings:  No intracranial hemorrhage. Remote basal ganglia, thalamic and right pontine infarcts.  Prominent small vessel disease type changes. The questioned tiny acute infarcts anterior inferior left frontal lobe are not appreciated by CT.  No hydrocephalus.  Global atrophy.  Lucency surrounds the upper posterior molar.  Polypoid opacification right frontal - ethmoid sinus junction.  No intracranial enhancing lesion.  Minimal calcified plaque cavernous segment internal carotid artery bilaterally.  Anterior circulation without medium or large size vessel significant stenosis or occlusion.  Anterior cerebral artery and middle cerebral artery branch vessel irregularity.  Minimal bulges of the superior margin of the M1 segment of the right middle cerebral artery, A1 segment of the left anterior cerebral artery and at the anterior communicating artery region have an appearance more suggestive of vessels or origin of vessels rather than aneurysm.  Fetal type origin of the posterior cerebral arteries bilaterally.  Irregular narrowed left vertebral artery.  Fenestrated basilar artery.  Minimal irregularity and bulge on the left without discrete saccular aneurysm.   Review of the MIP images confirms the above findings.  IMPRESSION: Intracranial atherosclerotic type changes as detailed above.  Fenestrated appearance of the basilar artery with minimal bulge but without discrete  saccular aneurysm.  Remote infarcts and small vessel disease type changes. MR questioned tiny acute left frontal lobe infarct not appreciated by CT.  Original Report Authenticated By: Fuller Canada, [name]   3. Ct Head Wo Contrast 07/01/2011  *RADIOLOGY REPORT*  Clinical Data: Syncopal episode, history of dementia  CT HEAD WITHOUT CONTRAST  Technique:  Contiguous axial images were obtained from the base of the skull through the vertex without contrast.  Comparison: 10/01/2010; 12/07/2009; brain MRI - 10/03/2010  Findings:  Redemonstrated diffuse atrophy.  Extensive periventricular hypodensities compatible microvascular ischemic disease.  Given background parenchymal abnormalities, there is no discrete evidence of acute large territory infarct. Grossly unchanged lacunar infarcts within the bilateral basal ganglia.  No interparenchymal or extra-axial mass or hemorrhage.  Unchanged size and configuration of the ventricles and basilar cisterns.  Polypoid mucosal thickening within the right frontal sinus.  Remaining paranasal sinuses mastoid air cells are otherwise normal.  Regional soft tissues are normal.  IMPRESSION: 1.  Grossly unchanged atrophy and extensive microvascular ischemic disease without definite acute intracranial process. 2.  Polypoid mucosal thickening within the right frontal sinus. No air fluid levels.  Original Report Authenticated By: Waynard Reeds, [name]   4. Mr [facility] Wo Contrast 07/02/2011  *RADIOLOGY REPORT*  Clinical Data:  High blood pressure.  Prior infarcts.  Syncope.  No focal weakness. Dementia.  MRI HEAD WITH AND WITHOUT CONTRAST MRA HEAD WITHOUT CONTRAST  Technique: Multiplanar, multiecho pulse sequences of the brain and surrounding structures were obtained according to standard protocol with and without intravenous contrast.  Angiographic images of the Circle of Willis were obtained using MRA technique without intravenous contrast.  Contrast: 18 ml MultiHance.  Comparison02/26/2013  CT.  10/03/2010 MR.  MRI HEAD  Findings: Questionable tiny acute infarcts anterior inferior left frontal lobe?  Remote basal ganglia, thalamic and right pontine infarcts.  Prominent small vessel disease type changes confluent periventricular subcortical region.  No intracranial mass or abnormal enhancement.  Global atrophy without hydrocephalus.  Hypertelorism with mild exophthalmos.  Opacification right frontal sinus with mild mucosal thickening ethmoid sinus air cells and maxillary sinuses.  Mild cervical spondylotic changes of the cervical spine.  Slightly heterogeneous appearance bone marrow unchanged.  Scattered punctate areas of blood breakdown products most consistent with result of hemorrhagic ischemia.  No other intracranial hemorrhage noted.  IMPRESSION: Questionable tiny acute infarcts anterior inferior left frontal lobe?  Please see above  MRA HEAD  Findings: Anterior circulation without medium or large size vessel significant stenosis or occlusion.  Mild branch vessel irregularity.  Fetal type origin of the posterior cerebral arteries.  Irregular narrowed left vertebral artery.  Right vertebral artery is dominant in size.  Fenestrated  basilar artery with irregularity/narrowing and minimal bulge proximally but without discrete saccular aneurysm.  Stability can be confirmed on follow-up.  Poor delineation of the PICAs.  Narrowing AICAs beyond the proximal aspect.  Poor delineation in the posterior cerebral artery branch vessels suggestive of prominent atherosclerotic changes greater on the right.  IMPRESSION: Intracranial atherosclerotic type changes more notable in the posterior circulation as well as involving branch vessels.  Since prior examination, a minimal bulge of the proximal fenestrated basilar artery is noted.  No discrete saccular aneurysm.  Original Report Authenticated By: Fuller Canada, [name]   EKG:  2/26: NSR 84bpm 1st degree AVB nonspecific ST-T changes including biphasic appearing T  waves V2-V3, question old inferior infarct 3/1: NSR 62bpm PR interval 198 nonspecific changes noted previously  Physical Exam: Blood pressure 164/88, pulse 69, temperature 97.2 F (36.2 C), temperature source Oral, resp. rate 18, height 5\' 7"  (1.702 m), weight 185 lb (83.915 kg), SpO2 95.00%. General: Well developed, well nourished black male, in no acute distress. Head: Normocephalic, atraumatic, sclera non-icteric, no xanthomas, nares are without discharge.  Neck: Negative for carotid bruits. JVD not elevated. Lungs: Clear bilaterally to auscultation without wheezes, rales, or rhonchi. Breathing is unlabored. Heart: RRR with S1 S2. Soft systolic ejection murmur. Abdomen: Soft, non-tender, non-distended with normoactive bowel sounds. No hepatomegaly. No rebound/guarding. No obvious abdominal masses. Msk:  Strength and tone appear normal for age. Extremities: No clubbing or cyanosis. No edema.  Distal pedal pulses are 2+ and equal bilaterally. Neuro: Alert and oriented X 3. Moves all extremities spontaneously. Psych:  Responds to questions appropriately with a normal affect.   Assessment and Plan:   1. Altered mental status with shaking/LOC. History most compatible with neurologic event.  New CVA vs seizure. I don't think this is related to arrhythmia.  2. Possible acute CVA on MRI with hx of CVA 3. Sinus bradycardia, transient slowing. Possibly exacerbated by Exelon. 4. Moderate aortic dilitation with mod AI- chronic and asymptomatic.  Plan: Agree with holding Exelon since this the only of his medicines that could cause bradycardia. Would avoid beta blockers or non-dihydroperidine calcium channel blockers. No indication for pacemaker. Continue antiplatelet Rx as per neurology. Anticipate discharge in am if rhythm stable.  Signed, Ronie Spies PA-C 07/04/2011, 2:08 PM Patient seen and examined and history reviewed. History and physical above obtained by me personally. Agree with above  findings and plan. Plan as noted.   Theron Arista JordanMD 07/04/2011 2:50 PM

## 2011-07-05 DIAGNOSIS — I6330 Cerebral infarction due to thrombosis of unspecified cerebral artery: Secondary | ICD-10-CM | POA: Diagnosis not present

## 2011-07-05 DIAGNOSIS — R55 Syncope and collapse: Secondary | ICD-10-CM | POA: Diagnosis not present

## 2011-07-05 DIAGNOSIS — G40309 Generalized idiopathic epilepsy and epileptic syndromes, not intractable, without status epilepticus: Secondary | ICD-10-CM | POA: Diagnosis not present

## 2011-07-05 DIAGNOSIS — I10 Essential (primary) hypertension: Secondary | ICD-10-CM | POA: Diagnosis not present

## 2011-07-05 DIAGNOSIS — N181 Chronic kidney disease, stage 1: Secondary | ICD-10-CM | POA: Diagnosis not present

## 2011-07-05 DIAGNOSIS — F0390 Unspecified dementia without behavioral disturbance: Secondary | ICD-10-CM | POA: Diagnosis not present

## 2011-07-05 MED ORDER — HYDRALAZINE HCL 25 MG PO TABS: 25.0000 mg | ORAL_TABLET | Freq: Three times a day (TID) | ORAL | Status: DC

## 2011-07-05 MED ORDER — LEVETIRACETAM 250 MG PO TABS: 250.0000 mg | ORAL_TABLET | Freq: Two times a day (BID) | ORAL | Status: DC

## 2011-07-05 MED ORDER — ASPIRIN 81 MG PO CHEW: 81.0000 mg | CHEWABLE_TABLET | Freq: Every day | ORAL | Status: AC

## 2011-07-05 NOTE — Progress Notes (Signed)
Sheron Nightingale to be D/C'd Home per MD order.  Discussed with the patient and all questions fully answered.   Sheron Nightingale  Home Medication Instructions AVW:[id number]   Printed on:07/05/11 1450  Medication Information                    darifenacin (ENABLEX) 7.5 MG 24 hr tablet Take 7.5 mg by mouth daily.             sertraline (ZOLOFT) 100 MG tablet Take 50 mg by mouth at bedtime.           clopidogrel (PLAVIX) 75 MG tablet Take 75 mg by mouth daily.            olmesartan-hydrochlorothiazide (BENICAR HCT) 40-12.5 MG per tablet Take 1 tablet by mouth daily.            felodipine (PLENDIL) 5 MG 24 hr tablet Take 5 mg by mouth daily.            hydrALAZINE (APRESOLINE) 25 MG tablet Take 1 tablet (25 mg total) by mouth every 8 (eight) hours.           levETIRAcetam (KEPPRA) 250 MG tablet Take 1 tablet (250 mg total) by mouth 2 (two) times daily.           aspirin 81 MG chewable tablet Chew 1 tablet (81 mg total) by mouth daily.             VVS, Skin clean, dry and intact without evidence of skin break down, no evidence of skin tears noted. IV catheter discontinued intact. Site without signs and symptoms of complications. Dressing and pressure applied.  An After Visit Summary was printed and given to the patient. Follow up appointments , new prescriptions and medication administration times given Patient escorted via WC, and D/C home via private auto.  Cindra Eves, RN 07/05/2011 2:50 PM

## 2011-07-05 NOTE — Progress Notes (Signed)
Patient ID: Mike Williams, male   DOB: [birth year], 78 y.o.   MRN: [id number] Telemetry reviewed. Slowest HR during the evening was 47 and Sinus bradycardia. OK to discharge home off Exelon per Cardiology.

## 2011-07-05 NOTE — Discharge Summary (Signed)
Mike Williams, 78 y.o., DOB 1932-10-27, MRN [id number]. Admission date: 07/01/2011 Discharge Date 07/05/2011 Primary MD [name], MD, MD Admitting Physician Lonia Blood, MD  Admission Diagnosis  Syncope [780.2] UTI (lower urinary tract infection) [599.0] Syncope  Discharge Diagnosis   Principal Problem:  *Syncope Active Problems:  DEMENTIA  HYPERTENSION  CEREBROVASCULAR ACCIDENT, HX OF  Chronic renal insufficiency, stage I  Sinus bradycardia  Aortic insufficiency   Past Medical History  Diagnosis Date  . CEREBROVASCULAR ACCIDENT, HX OF 02/09/2007  . DEMENTIA 05/12/2007  . HYPERGLYCEMIA 09/21/2009  . HYPERTENSION 12/29/2006  . OBSTRUCTIVE SLEEP APNEA 12/29/2006  . PROSTATE CANCER, HX OF 12/29/2006  . Hemorrhoids   . Lactose intolerance   . Risk for falls   . Prostatitis   . Aortic root dilatation     Noted on 09/2010 echo, with mild-mod AI    Past Surgical History  Procedure Date  . Cholecystectomy   . Prostate surgery     cryotherapy  . Ganglion cyst excision     Brief history and Physical HPI: 78 yo with history of HTN and CVA x2 here after an episode of passing out tonight. He was in his Kitchen washing dishes when wife found him shaking and about to fall down. She quickly held him and laid him down. He lost consciousness for about 10 minutes. No NV, no fever. He is now back to his baseline. Denies focal weakness. No prior history of seizure disorder. Per wife, similar episode when he had his strokes.   Hospital Course   Seizure Patient was noted with the diagnosis of syncope versus seizure, patient came with similar episodes in the past so neurology consultation was obtained. An EEG was performed, which showed mild diffuse bedroom slowing suggestive of mild cerebral dysfunction due to toxic metabolic encephalopathy, neurodegenerative disorder and all bihemispheric structural abnormality. Patient was put on low-dose Keppra by Dr. Anne Hahn and will continue to 50 mg by  mouth twice a day at home. He's advised not to drive the car, climbing ladders for 6 months.  Syncope Patient developed bradycardia in the hospital with heart rate dropping to low 40s, patient's Exelon patch was discontinued as this can cause bradycardia. After stopping that patient's heart rate has improved. On the day of discharge the heart rate is between 60s and 80s. Cardiology consultation was obtained at this time cardiology has known recommendation.  Small left frontal infarct At this time recommendation is to continue aspirin 81 mg along with Plavix 75 mg daily. Dr. Anne Hahn has followed this patient as outpatient and he can follow within 3 weeks  Hypertension Since blood pressure was not well controlled, with his home medications we started the patient on hydralazine 25 mg by mouth 3 times a day and this time his blood pressure is well controlled he will be sent home on the same .  Dementia Patient was on Exelon patch, which has been discontinued as per cardiology recommendation due to bradycardia. Patient will follow per Dr. Anne Hahn for further recommendations as outpatient for the dementia  Consults  Cardiology Neurology  Significant Tests:  See full reports for all details    Ct Angio Head W/cm &/or Wo Cm  07/03/2011  *RADIOLOGY REPORT*   IMPRESSION: Fusiform dilation of the aortic arch with aneurysm projecting anterior inferior left lateral aspect of the proximal aortic arch appears relatively similar to the prior exam.  Normal sized mediastinal lymph nodes.  Diminutive size left vertebral artery arises directly from the aortic arch.  Mild to moderate narrowing proximal left vertebral artery as it arises from the aortic arch.  Very mild narrowing of the origin of the right vertebral artery.  Minimal plaque proximal right internal carotid artery without significant stenosis. Fold with mild narrowing and irregularity of the right internal carotid artery at the C1 level.  Subtle  fibromuscular dysplasia not entirely excluded.  Ectatic proximal left internal carotid artery without hemodynamically significant stenosis.  At the C1 level, there is a focal dilation of the left internal carotid artery measuring up to 8 mm whereas just proximal to this level left internal carotid artery measures 5.2 mm.  Heterogeneous thyroid gland with nodularity on the right measuring up to 1.1 cm.  This can be followed with thyroid ultrasound.  Scattered increased number of neck lymph nodes.  Largest lymph node deep to the left sternocleidomastoid muscle measures 1.2 x 0.7 cm maximal transverse dimension spanning over 1.7 cm.  These do not appear necrotic.  Etiology indeterminate.   CTA HEAD  Findings:  No intracranial hemorrhage. Remote basal ganglia, thalamic and right pontine infarcts.  Prominent small vessel disease type changes. The questioned tiny acute infarcts anterior inferior left frontal lobe are not appreciated by CT.  No hydrocephalus.  Global atrophy.  Lucency surrounds the upper posterior molar.  Polypoid opacification right frontal - ethmoid sinus junction.  No intracranial enhancing lesion.  Minimal calcified plaque cavernous segment internal carotid artery bilaterally.  Anterior circulation without medium or large size vessel significant stenosis or occlusion.  Anterior cerebral artery and middle cerebral artery branch vessel irregularity.  Minimal bulges of the superior margin of the M1 segment of the right middle cerebral artery, A1 segment of the left anterior cerebral artery and at the anterior communicating artery region have an appearance more suggestive of vessels or origin of vessels rather than aneurysm.  Fetal type origin of the posterior cerebral arteries bilaterally.  Irregular narrowed left vertebral artery.  Fenestrated basilar artery.  Minimal irregularity and bulge on the left without discrete saccular aneurysm.   Review of the MIP images confirms the above findings.   IMPRESSION: Intracranial atherosclerotic type changes as detailed above.  Fenestrated appearance of the basilar artery with minimal bulge but without discrete saccular aneurysm.  Remote infarcts and small vessel disease type changes. MR questioned tiny acute left frontal lobe infarct not appreciated by CT.  Original Report Authenticated By: Fuller Canada, [name]   Ct Head Wo Contrast  07/01/2011   IMPRESSION: 1.  Grossly unchanged atrophy and extensive microvascular ischemic disease without definite acute intracranial process. 2.  Polypoid mucosal thickening within the right frontal sinus. No air fluid levels.  Original Report Authenticated By: Waynard Reeds, [name]   Ct Angio Neck W/cm &/or Wo/cm  07/03/2011  *RADIOLOGY REPORT*  Clinical Data:  High blood pressure.  Prior infarcts.  Episode of syncope.  CT ANGIOGRAPHY HEAD AND NECK  Technique:  Multidetector CT imaging of the head and neck was performed using the standard protocol during bolus administration of intravenous contrast.  Multiplanar CT image reconstructions including MIPs were obtained to evaluate the vascular anatomy. Carotid stenosis measurements (when applicable) are obtained utilizing NASCET criteria, using the distal internal carotid diameter as the denominator.  Contrast: 50mL OMNIPAQUE IOHEXOL 350 MG/ML IV SOLN  Comparison:  MR brain and MRA angiogram circle of Willis 07/02/2011. 12/09/2009 CT chest  CTA NECK  Findings:  Fusiform dilation of the aortic arch with aneurysm projecting anterior inferior left lateral aspect of the proximal aortic  arch appears relatively similar to the prior exam.  Normal sized mediastinal lymph nodes.  Diminutive size left vertebral artery arises directly from the aortic arch.  This is appears to be congenitally small given the caliber of the transverse foramen. Mild to moderate narrowing proximal left vertebral artery as it arises from the aortic arch.  Common origin of the innominate artery and left common  carotid artery.  Prominent fold with mild narrowing proximal right subclavian artery.  Calcified plaque right subclavian artery just proximal to the takeoff of the right vertebral artery.  Very mild narrowing of the origin of the right vertebral artery.  Ectatic right common carotid artery.  Minimal plaque proximal right internal carotid artery without significant stenosis. Fold with mild narrowing and irregularity of the right internal carotid artery at the C1 level.  Subtle fibromuscular dysplasia not entirely excluded.  Ectatic left common carotid artery.  Ectatic proximal left internal carotid artery without hemodynamically significant stenosis.  At the C1 level, there is a focal dilation of the left internal carotid artery measuring up to 8 mm whereas just proximal to this level left internal carotid artery measures 5.2 mm.  No pleating to suggest fibromuscular dysplasia.  Cervical spondylotic changes.  Heterogeneous thyroid gland with nodularity on the right measuring up to 1.1 cm.  This can be followed with thyroid ultrasound.  Scattered increased number of neck lymph nodes.  These do not appear necrotic.  Etiology indeterminate.  Small laryngocele incidentally noted.   Review of the MIP images confirms the above findings.  IMPRESSION: Fusiform dilation of the aortic arch with aneurysm projecting anterior inferior left lateral aspect of the proximal aortic arch appears relatively similar to the prior exam.  Normal sized mediastinal lymph nodes.  Diminutive size left vertebral artery arises directly from the aortic arch.   Mild to moderate narrowing proximal left vertebral artery as it arises from the aortic arch.  Very mild narrowing of the origin of the right vertebral artery.  Minimal plaque proximal right internal carotid artery without significant stenosis. Fold with mild narrowing and irregularity of the right internal carotid artery at the C1 level.  Subtle fibromuscular dysplasia not entirely excluded.   Ectatic proximal left internal carotid artery without hemodynamically significant stenosis.  At the C1 level, there is a focal dilation of the left internal carotid artery measuring up to 8 mm whereas just proximal to this level left internal carotid artery measures 5.2 mm.  Heterogeneous thyroid gland with nodularity on the right measuring up to 1.1 cm.  This can be followed with thyroid ultrasound.  Scattered increased number of neck lymph nodes.  Largest lymph node deep to the left sternocleidomastoid muscle measures 1.2 x 0.7 cm maximal transverse dimension spanning over 1.7 cm.  These do not appear necrotic.  Etiology indeterminate.  CTA HEAD  Findings:  No intracranial hemorrhage. Remote basal ganglia, thalamic and right pontine infarcts.  Prominent small vessel disease type changes. The questioned tiny acute infarcts anterior inferior left frontal lobe are not appreciated by CT.  No hydrocephalus.  Global atrophy.  Lucency surrounds the upper posterior molar.  Polypoid opacification right frontal - ethmoid sinus junction.  No intracranial enhancing lesion.  Minimal calcified plaque cavernous segment internal carotid artery bilaterally.  Anterior circulation without medium or large size vessel significant stenosis or occlusion.  Anterior cerebral artery and middle cerebral artery branch vessel irregularity.  Minimal bulges of the superior margin of the M1 segment of the right middle cerebral artery, A1 segment of  the left anterior cerebral artery and at the anterior communicating artery region have an appearance more suggestive of vessels or origin of vessels rather than aneurysm.  Fetal type origin of the posterior cerebral arteries bilaterally.  Irregular narrowed left vertebral artery.  Fenestrated basilar artery.  Minimal irregularity and bulge on the left without discrete saccular aneurysm.   Review of the MIP images confirms the above findings.  IMPRESSION: Intracranial atherosclerotic type changes as  detailed above.  Fenestrated appearance of the basilar artery with minimal bulge but without discrete saccular aneurysm.  Remote infarcts and small vessel disease type changes. MR questioned tiny acute left frontal lobe infarct not appreciated by CT.  Original Report Authenticated By: Fuller Canada, [name]   Mr [facility] Wo Contrast  07/02/2011  .  IMPRESSION: Intracranial atherosclerotic type changes more notable in the posterior circulation as well as involving branch vessels.  Since prior examination, a minimal bulge of the proximal fenestrated basilar artery is noted.  No discrete saccular aneurysm.  Original Report Authenticated By: Fuller Canada, [name]   Mr Laqueta Jean Wo Contrast  07/02/2011  *RADIOLOGY REPORT*  Clinical Data:  High blood pressure.  Prior infarcts.  Syncope.  No focal weakness. Dementia.  MRI HEAD WITH AND WITHOUT CONTRAST MRA HEAD WITHOUT CONTRAST  Technique: Multiplanar, multiecho pulse sequences of the brain and surrounding structures were obtained according to standard protocol with and without intravenous contrast.  Angiographic images of the Circle of Willis were obtained using MRA technique without intravenous contrast.  Contrast: 18 ml MultiHance.  Comparison02/26/2013 CT.  10/03/2010 MR.   MRI HEAD  Findings: Questionable tiny acute infarcts anterior inferior left frontal lobe?  Remote basal ganglia, thalamic and right pontine infarcts.  Prominent small vessel disease type changes confluent periventricular subcortical region.  No intracranial mass or abnormal enhancement.  Global atrophy without hydrocephalus.  Hypertelorism with mild exophthalmos.  Opacification right frontal sinus with mild mucosal thickening ethmoid sinus air cells and maxillary sinuses.  Mild cervical spondylotic changes of the cervical spine.  Slightly heterogeneous appearance bone marrow unchanged.  Scattered punctate areas of blood breakdown products most consistent with result of hemorrhagic ischemia.  No  other intracranial hemorrhage noted.   IMPRESSION: Questionable tiny acute infarcts anterior inferior left frontal lobe?  Please see above  MRA HEAD  Findings: Anterior circulation without medium or large size vessel significant stenosis or occlusion.  Mild branch vessel irregularity.  Fetal type origin of the posterior cerebral arteries.  Irregular narrowed left vertebral artery.  Right vertebral artery is dominant in size.  Fenestrated  basilar artery with irregularity/narrowing and minimal bulge proximally but without discrete saccular aneurysm.  Stability can be confirmed on follow-up.  Poor delineation of the PICAs.  Narrowing AICAs beyond the proximal aspect.  Poor delineation in the posterior cerebral artery branch vessels suggestive of prominent atherosclerotic changes greater on the right.   IMPRESSION: Intracranial atherosclerotic type changes more notable in the posterior circulation as well as involving branch vessels.  Since prior examination, a minimal bulge of the proximal fenestrated basilar artery is noted.  No discrete saccular aneurysm.  Original Report Authenticated By: Fuller Canada, [name]     Today   Subjective:   Fillmore Bynum today has no headache,no chest abdominal pain,no new weakness   Objective:   Blood pressure 141/85, pulse 80, temperature 97.9 F (36.6 C), temperature source Oral, resp. rate 18, height 5\' 7"  (1.702 m), weight 83.915 kg (185 lb), SpO2 96.00%.  Intake/Output Summary (Last  24 hours) at 07/05/11 1326 Last data filed at 07/05/11 0600  Gross per 24 hour  Intake    760 ml  Output   1001 ml  Net   -241 ml    Exam Awake Alert, No new F.N deficits, Normal affect [place].AT,PERRAL Supple Neck,No JVD, No cervical lymphadenopathy appriciated.  Symmetrical Chest wall movement, Good air movement bilaterally, CTAB RRR,No Gallops,Rubs or new Murmurs, No Parasternal Heave +ve B.Sounds, Abd Soft, Non tender, No organomegaly appriciated, No rebound -guarding or  rigidity. No Cyanosis, Clubbing or edema, No new Rash or bruise  Data Review   Cultures -   CBC w Diff: Lab Results  Component Value Date   WBC 7.8 07/02/2011   HGB 12.8* 07/02/2011   HCT 38.3* 07/02/2011   PLT 190 07/02/2011   LYMPHOPCT 15 07/01/2011   MONOPCT 6 07/01/2011   EOSPCT 0 07/01/2011   BASOPCT 0 07/01/2011   CMP: Lab Results  Component Value Date   NA 139 07/04/2011   K 3.6 07/04/2011   CL 103 07/04/2011   CO2 26 07/04/2011   BUN 15 07/04/2011   CREATININE 0.99 07/04/2011   PROT 7.0 07/02/2011   ALBUMIN 3.1* 07/02/2011   BILITOT 0.3 07/02/2011   ALKPHOS 70 07/02/2011   AST 28 07/02/2011   ALT 20 07/02/2011  .  Micro Results No results found for this or any previous visit (from the past 240 hour(s)).   Discharge Instructions      Follow-up Information    Follow up with [name], MD in 2 weeks.      Follow up with Lesly Dukes, MD in 3 weeks.   Contact information:   [address] [place] [address] [facility] [place] [address] [phone]          Discharge Medications   Medication List  As of 07/05/2011  1:26 PM   START taking these medications         hydrALAZINE 25 MG tablet   Commonly known as: APRESOLINE   Take 1 tablet (25 mg total) by mouth every 8 (eight) hours.      levETIRAcetam 250 MG tablet   Commonly known as: KEPPRA   Take 1 tablet (250 mg total) by mouth 2 (two) times daily.         CONTINUE taking these medications         aspirin EC 81 MG tablet      BENICAR HCT 40-12.5 MG per tablet   Generic drug: olmesartan-hydrochlorothiazide      darifenacin 7.5 MG 24 hr tablet   Commonly known as: ENABLEX      felodipine 5 MG 24 hr tablet   Commonly known as: PLENDIL      PLAVIX 75 MG tablet   Generic drug: clopidogrel      sertraline 100 MG tablet   Commonly known as: ZOLOFT         STOP taking these medications         rivastigmine 4.6 mg/24hr          Where to get your  medications    These are the prescriptions that you need to pick up.   You may get these medications from any pharmacy.         hydrALAZINE 25 MG tablet   levETIRAcetam 250 MG tablet             Total Time in preparing paper work, data evaluation and todays exam - 35  minutes  Mauro Kaufmann S M.D on 07/05/2011 at 1:26 PM  [facility] Office  [phone]

## 2011-07-05 NOTE — Progress Notes (Signed)
Stroke Team Progress Note  HISTORY Mike Williams is an 78 y.o. male black admitted via ER for fall. Pt has dementia and does not recall why he came to tht hospital. Some of the history is coming from charting for this reason.  The chart the patient was in his kitchen when his wife walked in and found him shaking. He apparently had a loss of consciousness for about 10 minutes. The patient tells me that he is feeling fine now and everything is okay. Is No further information  SUBJECTIVE No family is at the bedside. Overall he feels his condition is rapidly improving. The patient reports no discomfort, no headache, numbness, or weakness.  OBJECTIVE Most recent Vital Signs: Temp: 97.9 F (36.6 C) (03/02 0518) Temp src: Oral (03/02 0518) BP: 150/83 mmHg (03/02 0518) Pulse Rate: 80  (03/02 0518) Respiratory Rate: 18 O2 Saturation: 96%  CBG (last 3)  No results found for this basename: GLUCAP:3 in the last 72 hours Intake/Output from previous day: 03/01 0701 - 03/02 0700 In: 1240 [P.O.:1020; I.V.:220] Out: 1501 [Urine:1500; Stool:1]  IV Fluid Intake:      . sodium chloride 20 mL/hr (07/04/11 1952)   Medications     . aspirin  81 mg Oral Daily  . clopidogrel  75 mg Oral Daily  . darifenacin  7.5 mg Oral Daily  . docusate sodium  100 mg Oral BID  . enoxaparin  40 mg Subcutaneous Q24H  . felodipine  10 mg Oral Daily  . hydrALAZINE  25 mg Oral Q8H  . hydrochlorothiazide  12.5 mg Oral Daily  . levETIRAcetam  250 mg Oral BID  . olmesartan  40 mg Oral Daily  . sertraline  50 mg Oral QHS  . sodium chloride  3 mL Intravenous Q12H  PRN acetaminophen, acetaminophen, ondansetron (ZOFRAN) IV, ondansetron  Diet:  Cardiac  Thin liquids Activity: Up with assistance DVT Prophylaxis:  Lovenox  Significant Diagnostic Studies: CBC    Component Value Date/Time   WBC 7.8 07/02/2011 0530   RBC 4.52 07/02/2011 0530   HGB 12.8* 07/02/2011 0530   HCT 38.3* 07/02/2011 0530   PLT 190 07/02/2011 0530    MCV 84.7 07/02/2011 0530   MCH 28.3 07/02/2011 0530   MCHC 33.4 07/02/2011 0530   RDW 14.0 07/02/2011 0530   LYMPHSABS 1.3 07/01/2011 2010   MONOABS 0.5 07/01/2011 2010   EOSABS 0.0 07/01/2011 2010   BASOSABS 0.0 07/01/2011 2010   CMP    Component Value Date/Time   NA 139 07/04/2011 0500   K 3.6 07/04/2011 0500   CL 103 07/04/2011 0500   CO2 26 07/04/2011 0500   GLUCOSE 89 07/04/2011 0500   BUN 15 07/04/2011 0500   CREATININE 0.99 07/04/2011 0500   CALCIUM 9.4 07/04/2011 0500   PROT 7.0 07/02/2011 0530   ALBUMIN 3.1* 07/02/2011 0530   AST 28 07/02/2011 0530   ALT 20 07/02/2011 0530   ALKPHOS 70 07/02/2011 0530   BILITOT 0.3 07/02/2011 0530   GFRNONAA 78* 07/04/2011 0500   GFRAA 90* 07/04/2011 0500   COAGS Lab Results  Component Value Date   INR 1.05 10/01/2010   Lipid Panel    Component Value Date/Time   CHOL 168 07/02/2011 0014   TRIG 215* 07/02/2011 0014   HDL 54 07/02/2011 0014   CHOLHDL 3.1 07/02/2011 0014   VLDL 43* 07/02/2011 0014   LDLCALC 71 07/02/2011 0014   HgbA1C  Lab Results  Component Value Date   HGBA1C  Value: 5.7 (NOTE)  According to the ADA Clinical Practice Recommendations for 2011, when HbA1c is used as a screening test:   >=6.5%   Diagnostic of Diabetes Mellitus           (if abnormal result  is confirmed)  5.7-6.4%   Increased risk of developing Diabetes Mellitus  References:Diagnosis and Classification of Diabetes Mellitus,Diabetes Care,2011,34(Suppl 1):S62-S69 and Standards of Medical Care in         Diabetes - 2011,Diabetes Care,2011,34  (Suppl 1):S11-S61.* 10/02/2010   Urine Drug Screen  No results found for this basename: labopia,  cocainscrnur,  labbenz,  amphetmu,  thcu,  labbarb    Alcohol Level No results found for this basename: eth     Results for orders placed during the hospital encounter of 07/01/11 (from the past 24 hour(s))  CARDIAC PANEL(CRET KIN+CKTOT+MB+TROPI)     Status: Normal    Collection Time   07/04/11  3:04 PM      Component Value Range   Total CK 95  7 - 232 (U/L)   CK, MB 1.8  0.3 - 4.0 (ng/mL)   Troponin I <0.30  <0.30 (ng/mL)   Relative Index RELATIVE INDEX IS INVALID  0.0 - 2.5   TSH     Status: Normal   Collection Time   07/04/11  3:04 PM      Component Value Range   TSH 2.078  0.350 - 4.500 (uIU/mL)    No results found.  CT of the brain  IMPRESSION:  1. Grossly unchanged atrophy and extensive microvascular ischemic  disease without definite acute intracranial process.  2. Polypoid mucosal thickening within the right frontal sinus. No  air fluid levels.   CT angio  not ordered  MRI of the brain   IMPRESSION:  Questionable tiny acute infarcts anterior inferior left frontal  lobe?  MRA of the brain IMPRESSION:  Intracranial atherosclerotic type changes more notable in the  posterior circulation as well as involving branch vessels.  Since prior examination, a minimal bulge of the proximal  fenestrated basilar artery is noted. No discrete saccular  aneurysm.   2D Echocardiogram  Study Conclusions  - Left ventricle: Systolic function was normal. The estimated ejection fraction was in the range of 55% to 60%. - Aortic valve: Moderate regurgitation. - Aorta: The aorta was moderately dilated. Transthoracic echocardiography.  Carotid Doppler   Carotid Dopplers completed.  Preliminary report: There is no ICA stenosis. Vertebral artery flow is antegrade.   CXR  not ordered  EKG  normal sinus rhythm, first degree AV block, probable old inferior infarct.   EEG report:  IMPRESSION: This was an abnormal awake and drowsy EEG due to the  presence of mild diffuse background slowing. The finding may be  suggestive of mild cerebral dysfunction and may be due to toxic  metabolic encephalopathy, neurodegenerative disorder and/or  bihemispheric structure abnormality. Clinical correlation is suggested.     Physical Exam  The patient is alert  and cooperative. The patient is oriented to person and place, not date.  Neurologic exam reveals full extraocular movements, speech is normal. Visual fields are full.  Motor testing reveals good strength of all four extremities. No drift is seen with the upper extremities.  The patient has good finger-nose-finger and heel-to-shin bilaterally. Gait was not tested.  Deep tendon reflexes are symmetric and normal. Toes are down going bilaterally.    ASSESSMENT Mr. Stoney Karczewski is a 78 y.o. male with a small left frontal infarct (2), secondary to possible embolus. On aspirin  325 mg orally every day and clopidogrel 75 mg orally every day for secondary stroke prevention.  Stroke risk factors:  hyperlipidemia and hypertension  Hospital day # 4  TREATMENT/PLAN Continue aspirin 325 mg orally every day and clopidogrel 75 mg orally every day for secondary stroke prevention.   The patient has been on aspirin and Plavix prior to admission. The patient has 2 small left frontal diffusion weighted positive lesions that may represent acute strokes. By history, the patient presented with a seizure event that may be associated with his underlying dementia. The patient has significant dementia prior to admission. The patient is followed through our office. The patient is on low-dose Exelon, which can lower the seizure threshold, and may cause bradycardia. At this point, I will add low-dose Keppra to his regimen. An EEG study showed mild diffuse background slowing, no epileptiform activity.  The full dose aspirin could be reduced to an 81 mg tablet.  The Exelon patch will be discontinued secondary to bradycardia.  Possible discharge home in the near future if heart rate stabilizes.   Cardiology has seen the patient, heart rates seem to have stabilized, OK to discharge patient to home. Neurology will sign off of this patient this time.   Lesly Dukes

## 2011-07-05 NOTE — Discharge Instructions (Signed)
STROKE/TIA DISCHARGE INSTRUCTIONS SMOKING Cigarette smoking nearly doubles your risk of having a stroke & is the single most alterable risk factor  If you smoke or have smoked in the last 12 months, you are advised to quit smoking for your health.  Most of the excess cardiovascular risk related to smoking disappears within a year of stopping.  Ask you doctor about anti-smoking medications  [place] Quit Line: 1-800-QUIT NOW  Free Smoking Cessation Classes [phone]  CHOLESTEROL Know your levels; limit fat & cholesterol in your diet  Lipid Panel     Component Value Date/Time   CHOL 168 07/02/2011 0014   TRIG 215* 07/02/2011 0014   HDL 54 07/02/2011 0014   CHOLHDL 3.1 07/02/2011 0014   VLDL 43* 07/02/2011 0014   LDLCALC 71 07/02/2011 0014      Many patients benefit from treatment even if their cholesterol is at goal.  Goal: Total Cholesterol (CHOL) less than 160  Goal:  Triglycerides (TRIG) less than 150  Goal:  HDL greater than 40  Goal:  LDL (LDLCALC) less than 100   BLOOD PRESSURE American Stroke Association blood pressure target is less that 120/80 mm/Hg  Your discharge blood pressure is:  BP: 168/89 mmHg  Monitor your blood pressure  Limit your salt and alcohol intake  Many individuals will require more than one medication for high blood pressure  DIABETES (A1c is a blood sugar average for last 3 months) Goal HGBA1c is under 7% (HBGA1c is blood sugar average for last 3 months)  Diabetes: {STROKE DC DIABETES:22357}    Lab Results  Component Value Date   HGBA1C  Value: 5.7 (NOTE)                                                                       According to the ADA Clinical Practice Recommendations for 2011, when HbA1c is used as a screening test:   >=6.5%   Diagnostic of Diabetes Mellitus           (if abnormal result  is confirmed)  5.7-6.4%   Increased risk of developing Diabetes Mellitus  References:Diagnosis and Classification of Diabetes Mellitus,Diabetes  Care,2011,34(Suppl 1):S62-S69 and Standards of Medical Care in         Diabetes - 2011,Diabetes Care,2011,34  (Suppl 1):S11-S61.* 10/02/2010     Your HGBA1c can be lowered with medications, healthy diet, and exercise.  Check your blood sugar as directed by your physician  Call your physician if you experience unexplained or low blood sugars.  PHYSICAL ACTIVITY/REHABILITATION Goal is 30 minutes at least 4 days per week    {STROKE DC ACTIVITY/REHAB:22359}  Activity decreases your risk of heart attack and stroke and makes your heart stronger.  It helps control your weight and blood pressure; helps you relax and can improve your mood.  Participate in a regular exercise program.  Talk with your doctor about the best form of exercise for you (dancing, walking, swimming, cycling).  DIET/WEIGHT Goal is to maintain a healthy weight  Your discharge diet is: Cardiac *** liquids Your height is:  Height: 5\' 7"  (170.2 cm) Your current weight is: Weight: 83.915 kg (185 lb) Your Body Mass Index (BMI) is:  BMI (Calculated): 29   Following the type of diet  specifically designed for you will help prevent another stroke.  Your goal weight range is:  ***  Your goal Body Mass Index (BMI) is 19-24.  Healthy food habits can help reduce 3 risk factors for stroke:  High cholesterol, hypertension, and excess weight.  RESOURCES Stroke/Support Group:  Call [phone]  they meet the 3rd Sunday of the month on the Rehab Unit at [facility], [place] ( no meetings June, July & Aug).  STROKE EDUCATION PROVIDED/REVIEWED AND GIVEN TO PATIENT Stroke warning signs and symptoms How to activate emergency medical system (call 911). Medications prescribed at discharge. Need for follow-up after discharge. Personal risk factors for stroke. Pneumonia vaccine given:   {STROKE DC YES/NO/DATE:22363} Flu vaccine given:   {STROKE DC YES/NO/DATE:22363} My questions have been answered, the writing is legible, and I understand these  instructions.  I will adhere to these goals & educational materials that have been provided to me after my discharge from the hospital.

## 2011-07-15 ENCOUNTER — Ambulatory Visit (INDEPENDENT_AMBULATORY_CARE_PROVIDER_SITE_OTHER): Payer: Medicare Other | Admitting: Internal Medicine

## 2011-07-15 ENCOUNTER — Encounter: Payer: Self-pay | Admitting: Internal Medicine

## 2011-07-15 VITALS — BP 136/82 | HR 80 | Temp 98.2°F | Wt 193.0 lb

## 2011-07-15 DIAGNOSIS — I498 Other specified cardiac arrhythmias: Secondary | ICD-10-CM

## 2011-07-15 DIAGNOSIS — R001 Bradycardia, unspecified: Secondary | ICD-10-CM

## 2011-07-15 DIAGNOSIS — R55 Syncope and collapse: Secondary | ICD-10-CM | POA: Diagnosis not present

## 2011-07-15 DIAGNOSIS — I351 Nonrheumatic aortic (valve) insufficiency: Secondary | ICD-10-CM

## 2011-07-15 DIAGNOSIS — F068 Other specified mental disorders due to known physiological condition: Secondary | ICD-10-CM | POA: Diagnosis not present

## 2011-07-15 DIAGNOSIS — I359 Nonrheumatic aortic valve disorder, unspecified: Secondary | ICD-10-CM

## 2011-07-15 MED ORDER — HYDRALAZINE HCL 25 MG PO TABS: 25.0000 mg | ORAL_TABLET | Freq: Three times a day (TID) | ORAL | Status: DC

## 2011-07-15 MED ORDER — CLOPIDOGREL BISULFATE 75 MG PO TABS: 75.0000 mg | ORAL_TABLET | Freq: Every day | ORAL | Status: DC

## 2011-07-16 NOTE — Assessment & Plan Note (Signed)
Progressive sxs.  he is at fall risk---schedule home PT for fall evaluation

## 2011-07-16 NOTE — Assessment & Plan Note (Signed)
meds were adjusted in hospital- Continue same meds  I doubt that bradycardia related to syncope

## 2011-07-16 NOTE — Progress Notes (Signed)
Patient ID: Mike Williams, male   DOB: 07-Dec-1932, 78 y.o.   MRN: [id number] Recent hospitalization for syncope---reviewed d/c summary with patient and wife and son.  Pt has done reasonably well at home but family has noted continued cognitive decline.  No recurent syncope. He is sleeping and eating well.  Aortic insufficiency---reviewed past 2 echos  Past Medical History  Diagnosis Date  . CEREBROVASCULAR ACCIDENT, HX OF 02/09/2007  . DEMENTIA 05/12/2007  . HYPERGLYCEMIA 09/21/2009  . HYPERTENSION 12/29/2006  . OBSTRUCTIVE SLEEP APNEA 12/29/2006  . PROSTATE CANCER, HX OF 12/29/2006  . Hemorrhoids   . Lactose intolerance   . Risk for falls   . Prostatitis   . Aortic root dilatation     Noted on 09/2010 echo, with mild-mod AI    History   Social History  . Marital Status: Married    Spouse Name: N/A    Number of Children: N/A  . Years of Education: N/A   Occupational History  . Not on file.   Social History Main Topics  . Smoking status: Never Smoker   . Smokeless tobacco: Not on file  . Alcohol Use:   . Drug Use:   . Sexually Active:    Other Topics Concern  . Not on file   Social History Narrative  . No narrative on file    Past Surgical History  Procedure Date  . Cholecystectomy   . Prostate surgery     cryotherapy  . Ganglion cyst excision     Family History  Problem Relation Age of Onset  . Heart attack Father   . Heart disease Father   . Cancer Sister     No Known Allergies  Current Outpatient Prescriptions on File Prior to Visit  Medication Sig Dispense Refill  . aspirin 81 MG chewable tablet Chew 1 tablet (81 mg total) by mouth daily.  30 tablet  0  . darifenacin (ENABLEX) 7.5 MG 24 hr tablet Take 7.5 mg by mouth daily.        . felodipine (PLENDIL) 5 MG 24 hr tablet Take 5 mg by mouth daily.       Marland Kitchen levETIRAcetam (KEPPRA) 250 MG tablet Take 1 tablet (250 mg total) by mouth 2 (two) times daily.  60 tablet  2  . olmesartan-hydrochlorothiazide (BENICAR  HCT) 40-12.5 MG per tablet Take 1 tablet by mouth daily.       . sertraline (ZOLOFT) 100 MG tablet Take 50 mg by mouth at bedtime.         patient denies chest pain, shortness of breath, orthopnea. Denies lower extremity edema, abdominal pain, change in appetite, change in bowel movements. Patient denies rashes, musculoskeletal complaints. No other specific complaints in a complete review of systems.   BP 136/82  Pulse 80  Temp(Src) 98.2 F (36.8 C) (Oral)  Wt 193 lb (87.544 kg)  well-developed well-nourished male in no acute distress. HEENT exam atraumatic, normocephalic, neck supple without jugular venous distention. Chest clear to auscultation cardiac exam S1-S2 are regular. Abdominal exam overweight with bowel sounds, soft and nontender. Extremities no edema. Neurologic exam is alert  And confused with a a broad based gait.

## 2011-07-16 NOTE — Assessment & Plan Note (Signed)
No recurrent sxs 

## 2011-07-17 DIAGNOSIS — F0280 Dementia in other diseases classified elsewhere without behavioral disturbance: Secondary | ICD-10-CM | POA: Diagnosis not present

## 2011-07-17 DIAGNOSIS — G309 Alzheimer's disease, unspecified: Secondary | ICD-10-CM | POA: Diagnosis not present

## 2011-07-17 DIAGNOSIS — R4701 Aphasia: Secondary | ICD-10-CM | POA: Diagnosis not present

## 2011-07-21 ENCOUNTER — Telehealth: Payer: Self-pay | Admitting: Internal Medicine

## 2011-07-21 NOTE — Telephone Encounter (Signed)
Pt was in last week for a hospital follow up, when does pt need to come back? Please advise

## 2011-07-21 NOTE — Telephone Encounter (Signed)
See me 3 months

## 2011-07-21 NOTE — Telephone Encounter (Signed)
Pt scheduled  

## 2011-07-23 ENCOUNTER — Ambulatory Visit: Payer: Medicare Other | Admitting: Internal Medicine

## 2011-07-25 DIAGNOSIS — G4733 Obstructive sleep apnea (adult) (pediatric): Secondary | ICD-10-CM | POA: Diagnosis not present

## 2011-07-25 DIAGNOSIS — R269 Unspecified abnormalities of gait and mobility: Secondary | ICD-10-CM | POA: Diagnosis not present

## 2011-07-25 DIAGNOSIS — IMO0001 Reserved for inherently not codable concepts without codable children: Secondary | ICD-10-CM | POA: Diagnosis not present

## 2011-07-25 DIAGNOSIS — I10 Essential (primary) hypertension: Secondary | ICD-10-CM | POA: Diagnosis not present

## 2011-07-25 DIAGNOSIS — I359 Nonrheumatic aortic valve disorder, unspecified: Secondary | ICD-10-CM | POA: Diagnosis not present

## 2011-07-25 DIAGNOSIS — F0390 Unspecified dementia without behavioral disturbance: Secondary | ICD-10-CM | POA: Diagnosis not present

## 2011-08-23 DIAGNOSIS — Z0279 Encounter for issue of other medical certificate: Secondary | ICD-10-CM

## 2011-10-04 ENCOUNTER — Other Ambulatory Visit: Payer: Self-pay | Admitting: Family Medicine

## 2011-10-10 ENCOUNTER — Telehealth: Payer: Self-pay | Admitting: Internal Medicine

## 2011-10-10 ENCOUNTER — Other Ambulatory Visit: Payer: Self-pay | Admitting: Internal Medicine

## 2011-10-10 MED ORDER — HYDRALAZINE HCL 25 MG PO TABS: 25.0000 mg | ORAL_TABLET | Freq: Three times a day (TID) | ORAL | Status: DC

## 2011-10-10 NOTE — Telephone Encounter (Signed)
Pt requesting refill on hydrALAZINE (APRESOLINE) 25 MG tablet  [facility] [address]

## 2011-10-10 NOTE — Telephone Encounter (Signed)
rx sent in electronically 

## 2011-10-15 ENCOUNTER — Ambulatory Visit: Payer: Medicare Other | Admitting: Internal Medicine

## 2011-10-17 ENCOUNTER — Other Ambulatory Visit: Payer: Self-pay | Admitting: Internal Medicine

## 2011-10-23 ENCOUNTER — Ambulatory Visit: Payer: Medicare Other | Admitting: Internal Medicine

## 2011-11-24 ENCOUNTER — Ambulatory Visit (INDEPENDENT_AMBULATORY_CARE_PROVIDER_SITE_OTHER): Payer: Medicare Other | Admitting: Internal Medicine

## 2011-11-24 VITALS — BP 132/84 | HR 80 | Temp 98.6°F | Wt 171.0 lb

## 2011-11-24 DIAGNOSIS — I10 Essential (primary) hypertension: Secondary | ICD-10-CM | POA: Diagnosis not present

## 2011-11-24 DIAGNOSIS — F068 Other specified mental disorders due to known physiological condition: Secondary | ICD-10-CM | POA: Diagnosis not present

## 2011-11-24 DIAGNOSIS — Z8679 Personal history of other diseases of the circulatory system: Secondary | ICD-10-CM

## 2011-11-24 MED ORDER — HYDRALAZINE HCL 25 MG PO TABS: 25.0000 mg | ORAL_TABLET | Freq: Three times a day (TID) | ORAL | Status: DC

## 2011-11-24 MED ORDER — CLOPIDOGREL BISULFATE 75 MG PO TABS: 75.0000 mg | ORAL_TABLET | Freq: Every day | ORAL | Status: DC

## 2011-11-24 MED ORDER — DARIFENACIN HYDROBROMIDE ER 7.5 MG PO TB24: 7.5000 mg | ORAL_TABLET | Freq: Every day | ORAL | Status: DC

## 2011-11-24 MED ORDER — LEVETIRACETAM 250 MG PO TABS: 250.0000 mg | ORAL_TABLET | Freq: Two times a day (BID) | ORAL | Status: DC

## 2011-11-24 NOTE — Progress Notes (Signed)
Patient ID: Mike Williams, male   DOB: 13-Jul-1932, 78 y.o.   MRN: [id number] Stroke-- tolerating meds, no known recurrence  htn-- no home bps but he is tolerating meds  Prostate CA-- has f/u with urology  Past Medical History  Diagnosis Date  . CEREBROVASCULAR ACCIDENT, HX OF 02/09/2007  . DEMENTIA 05/12/2007  . HYPERGLYCEMIA 09/21/2009  . HYPERTENSION 12/29/2006  . OBSTRUCTIVE SLEEP APNEA 12/29/2006  . PROSTATE CANCER, HX OF 12/29/2006  . Hemorrhoids   . Lactose intolerance   . Risk for falls   . Prostatitis   . Aortic root dilatation     Noted on 09/2010 echo, with mild-mod AI    History   Social History  . Marital Status: Married    Spouse Name: N/A    Number of Children: N/A  . Years of Education: N/A   Occupational History  . Not on file.   Social History Main Topics  . Smoking status: Never Smoker   . Smokeless tobacco: Not on file  . Alcohol Use:   . Drug Use:   . Sexually Active:    Other Topics Concern  . Not on file   Social History Narrative  . No narrative on file    Past Surgical History  Procedure Date  . Cholecystectomy   . Prostate surgery     cryotherapy  . Ganglion cyst excision     Family History  Problem Relation Age of Onset  . Heart attack Father   . Heart disease Father   . Cancer Sister     No Known Allergies  Current Outpatient Prescriptions on File Prior to Visit  Medication Sig Dispense Refill  . aspirin 81 MG chewable tablet Chew 1 tablet (81 mg total) by mouth daily.  30 tablet  0  . felodipine (PLENDIL) 5 MG 24 hr tablet take 1 tablet by mouth once daily  30 tablet  5  . olmesartan-hydrochlorothiazide (BENICAR HCT) 40-12.5 MG per tablet Take 1 tablet by mouth daily.       Marland Kitchen DISCONTD: darifenacin (ENABLEX) 7.5 MG 24 hr tablet Take 7.5 mg by mouth daily.        Marland Kitchen DISCONTD: hydrALAZINE (APRESOLINE) 25 MG tablet Take 1 tablet (25 mg total) by mouth every 8 (eight) hours.  90 tablet  1  . DISCONTD: levETIRAcetam (KEPPRA) 250 MG  tablet take 1 tablet by mouth twice a day  60 tablet  2  . DISCONTD: felodipine (PLENDIL) 5 MG 24 hr tablet Take 5 mg by mouth daily.          patient denies chest pain, shortness of breath, orthopnea. Denies lower extremity edema, abdominal pain, change in appetite, change in bowel movements. Patient denies rashes, musculoskeletal complaints. No other specific complaints in a complete review of systems.   BP 132/84  Pulse 80  Temp 98.6 F (37 C) (Oral)  Wt 171 lb (77.565 kg) Confused, well-nourished male in no acute distress. HEENT exam atraumatic, normocephalic, neck supple without jugular venous distention. Chest clear to auscultation cardiac exam S1-S2 are regular. Abdominal exam overweight with bowel sounds, soft and nontender. Extremities no edema. Neurologic exam he is alert with a broad based gait.

## 2011-11-24 NOTE — Assessment & Plan Note (Signed)
BP Readings from Last 3 Encounters:  11/24/11 132/84  07/15/11 136/82  07/05/11 152/81   Fair control Continue meds

## 2011-11-24 NOTE — Assessment & Plan Note (Signed)
No recent recurrence Continued risk factor modification  Discussed DNR status- pt's wife is poa, they (wife) will consider DNR

## 2011-11-24 NOTE — Assessment & Plan Note (Signed)
preogressive but wife taking great care of him

## 2011-12-01 DIAGNOSIS — C61 Malignant neoplasm of prostate: Secondary | ICD-10-CM | POA: Diagnosis not present

## 2011-12-01 DIAGNOSIS — N3941 Urge incontinence: Secondary | ICD-10-CM | POA: Diagnosis not present

## 2011-12-01 DIAGNOSIS — Z8546 Personal history of malignant neoplasm of prostate: Secondary | ICD-10-CM | POA: Diagnosis not present

## 2012-01-01 ENCOUNTER — Encounter (HOSPITAL_COMMUNITY): Payer: Self-pay | Admitting: Emergency Medicine

## 2012-01-01 ENCOUNTER — Emergency Department (HOSPITAL_COMMUNITY)
Admission: EM | Admit: 2012-01-01 | Discharge: 2012-01-01 | Disposition: A | Discharge status: Home / Self Care | Payer: Medicare Other | Attending: Emergency Medicine | Admitting: Emergency Medicine

## 2012-01-01 DIAGNOSIS — N390 Urinary tract infection, site not specified: Secondary | ICD-10-CM | POA: Diagnosis not present

## 2012-01-01 DIAGNOSIS — Z8673 Personal history of transient ischemic attack (TIA), and cerebral infarction without residual deficits: Secondary | ICD-10-CM | POA: Insufficient documentation

## 2012-01-01 DIAGNOSIS — R32 Unspecified urinary incontinence: Secondary | ICD-10-CM | POA: Diagnosis not present

## 2012-01-01 DIAGNOSIS — G4733 Obstructive sleep apnea (adult) (pediatric): Secondary | ICD-10-CM | POA: Diagnosis not present

## 2012-01-01 DIAGNOSIS — Z8546 Personal history of malignant neoplasm of prostate: Secondary | ICD-10-CM | POA: Insufficient documentation

## 2012-01-01 DIAGNOSIS — R609 Edema, unspecified: Secondary | ICD-10-CM | POA: Insufficient documentation

## 2012-01-01 DIAGNOSIS — I10 Essential (primary) hypertension: Secondary | ICD-10-CM | POA: Diagnosis not present

## 2012-01-01 DIAGNOSIS — F0390 Unspecified dementia without behavioral disturbance: Secondary | ICD-10-CM | POA: Diagnosis not present

## 2012-01-01 LAB — URINE MICROSCOPIC-ADD ON

## 2012-01-01 LAB — CBC WITH DIFFERENTIAL/PLATELET
Basophils Relative: 0 % (ref 0–1)
Eosinophils Relative: 2 % (ref 0–5)
Hemoglobin: 14.7 g/dL (ref 13.0–17.0)
Lymphocytes Relative: 21 % (ref 12–46)
MCH: 28.2 pg (ref 26.0–34.0)
Monocytes Relative: 11 % (ref 3–12)
Neutrophils Relative %: 66 % (ref 43–77)
RBC: 5.22 MIL/uL (ref 4.22–5.81)

## 2012-01-01 LAB — URINALYSIS, ROUTINE W REFLEX MICROSCOPIC
Bilirubin Urine: NEGATIVE
Ketones, ur: NEGATIVE mg/dL
Nitrite: NEGATIVE
Protein, ur: NEGATIVE mg/dL
pH: 6.5 (ref 5.0–8.0)

## 2012-01-01 LAB — BASIC METABOLIC PANEL
CO2: 27 mEq/L (ref 19–32)
Chloride: 101 mEq/L (ref 96–112)
GFR calc Af Amer: 78 mL/min — ABNORMAL LOW (ref >90)
GFR calc non Af Amer: 68 mL/min — ABNORMAL LOW (ref >90)
Glucose, Bld: 123 mg/dL — ABNORMAL HIGH (ref 70–99)

## 2012-01-01 MED ORDER — CIPROFLOXACIN HCL 500 MG PO TABS
500.0000 mg | ORAL_TABLET | Freq: Once | ORAL | Status: AC
Start: 2012-01-01 — End: 2012-01-01
  Administered 2012-01-01: 500 mg via ORAL
  Filled 2012-01-01: qty 1

## 2012-01-01 MED ORDER — CIPROFLOXACIN IN D5W 400 MG/200ML IV SOLN: 400.0000 mg | Freq: Once | INTRAVENOUS | Status: DC

## 2012-01-01 MED ORDER — CIPROFLOXACIN HCL 500 MG PO TABS: 500.0000 mg | ORAL_TABLET | Freq: Two times a day (BID) | ORAL | Status: AC

## 2012-01-01 NOTE — ED Notes (Signed)
Pt. Has history of urinary tract infections.  Pt.'s wife is giving report since pt. Has dementia.  Pt. Has had diarrhea since Monday, 12/29/11, and urinary urgency with odor.  Pt. Is normally incontinent due to dementia but wife reports this is more urgent.  Primary care office told pt''s wife to bring him to the emergency department.  Wife states that pt is taking longer to carry out activities, but cognitively he is at his baseline.

## 2012-01-01 NOTE — ED Provider Notes (Signed)
History     CSN: [id number]  Arrival date & time 01/01/12  1036   First MD Initiated Contact with Patient 01/01/12 1120      Chief Complaint  Patient presents with  . Urinary Urgency    (Consider location/radiation/quality/duration/timing/severity/associated sxs/prior treatment) HPI LEVEL 5 caveat applies, pt w dementia and largely nonverbal. History provided by pt's wife, Gershon Cull, who is his primary caretaker.  Pt is a 78 yo male w pmh of dementia, HTN, CVA, and stage T1c prostate ca s/p cryoablation presenting w 2 days of increased urinary urgency and frequency. Pt's wife reports that four days ago, he had 2 days of loose stools which were relieved with immodium. He had no diarrhea yesterday or today. However, yesterday, his wife began to notice increased urinary frequency and incontinence. He wears depends at baseline due to difficulties with urinary incontinence, but often can make it to the bathroom in time. For the past few days, pt unable to make it to bathroom and has soaked diaper. Has had a couple of UTIs since prostate cryoablation and wife is concerned that he may have infection. She has not noticed any dribbling or decreased strength of husband's stream. No change in color of urine. Pt has not complained of any dysuria, back pain, suprapubic pain or abdominal pain. Per wife, pt has had decreased appetite, but no nausea, vomiting or fever. She says he seems a little bit more distractible than usual.    Past Medical History  Diagnosis Date  . CEREBROVASCULAR ACCIDENT, HX OF 02/09/2007  . DEMENTIA 05/12/2007  . HYPERGLYCEMIA 09/21/2009  . HYPERTENSION 12/29/2006  . OBSTRUCTIVE SLEEP APNEA 12/29/2006  . PROSTATE CANCER, HX OF 12/29/2006  . Hemorrhoids   . Lactose intolerance   . Risk for falls   . Prostatitis   . Aortic root dilatation     Noted on 09/2010 echo, with mild-mod AI    Past Surgical History  Procedure Date  . Cholecystectomy   . Prostate surgery    cryotherapy  . Ganglion cyst excision     Family History  Problem Relation Age of Onset  . Heart attack Father   . Heart disease Father   . Cancer Sister     History  Substance Use Topics  . Smoking status: Never Smoker   . Smokeless tobacco: Not on file  . Alcohol Use:       Review of Systems  Unable to perform ROS: Dementia    Allergies  Review of patient's allergies indicates no known allergies.  Home Medications   Current Outpatient Rx  Name Route Sig Dispense Refill  . ASPIRIN 81 MG PO CHEW Oral Chew 1 tablet (81 mg total) by mouth daily. 30 tablet 0  . CLOPIDOGREL BISULFATE 75 MG PO TABS Oral Take 1 tablet (75 mg total) by mouth daily. 90 tablet 3  . DARIFENACIN HYDROBROMIDE ER 7.5 MG PO TB24 Oral Take 1 tablet (7.5 mg total) by mouth daily. 90 tablet 3  . FELODIPINE ER 5 MG PO TB24  take 1 tablet by mouth once daily 30 tablet 5  . HYDRALAZINE HCL 25 MG PO TABS Oral Take 1 tablet (25 mg total) by mouth every 8 (eight) hours. 90 tablet 3  . LEVETIRACETAM 250 MG PO TABS Oral Take 1 tablet (250 mg total) by mouth 2 (two) times daily. 180 tablet 3  . OLMESARTAN MEDOXOMIL-HCTZ 40-12.5 MG PO TABS Oral Take 1 tablet by mouth daily.       BP 128/71  Pulse 88  Temp 98.8 F (37.1 C) (Oral)  Resp 16  Ht 5\' 7"  (1.702 m)  Wt 164 lb (74.39 kg)  BMI 25.69 kg/m2  SpO2 95%  Physical Exam  Vitals reviewed. Constitutional: He appears well-developed. No distress.  HENT:  Head: Normocephalic and atraumatic.  Mouth/Throat: Oropharynx is clear and moist.  Eyes: Conjunctivae and EOM are normal. Pupils are equal, round, and reactive to light.       Thin mucous surrounding bilateral eyes.   Neck: Normal range of motion. Neck supple. No JVD present. No tracheal deviation present. No thyromegaly present.  Cardiovascular: Normal rate, regular rhythm, normal heart sounds and intact distal pulses.  Exam reveals no gallop and no friction rub.   No murmur heard. Pulmonary/Chest:  Effort normal and breath sounds normal. No respiratory distress. He has no wheezes. He exhibits no tenderness.  Abdominal: Soft. Bowel sounds are normal. He exhibits no distension. There is no tenderness.  Genitourinary:       No CVA tenderness. No suprapubic tenderness or distention.   Neurological: No cranial nerve deficit.       Pt w paucity of speech, follows commands, answers some yes and no questions  Skin: He is not diaphoretic.       1+ pitting edema, wife says unchanged from chronic     ED Course  Procedures (including critical care time)   Labs Reviewed  URINALYSIS, ROUTINE W REFLEX MICROSCOPIC   No results found.   No diagnosis found.    MDM  1. Urinary urgency/frequency Pt w h/o UTIs in the past and 2 days of increased urinary urgency and frequency. No suprapubic distention or pain, no hestitancy or dribbling to suggest obstruction. Pt not toxic, afebrile, hemodynamically stable. No CVA tenderness to suggest upper GU pathology. Suspect cystitis. - Will check UA, CBC, Bmet.         Bronson Curb, MD 01/01/12 [phone]

## 2012-01-01 NOTE — ED Notes (Addendum)
According to wife, pt has had episodes of urine and bowel incontinence.  Pt started having bowel incontinence on Tuesday and had 2 episodes that day.  Last episode of bowel incontinence was yesterday morning.  Pt also started having urinary incontinence yesterday morning.  He cannot control his urge to go.  Last episode of urinary incontinence was this morning.  Pt has a history of prostate problems. Pt is denying having any pain at the moment but is crying and not verbalizing very much.  Wife has given imodium at home for his bowel incontinence with his last dose of imodium being yesterday.

## 2012-01-01 NOTE — ED Provider Notes (Signed)
  I performed a history and physical examination of Mike Williams and discussed his management with Dr. Loura Pardon.  I agree with the history, physical, assessment, and plan of care, with the following exceptions: None  I discussed all findings with the patient's wife, given his condition.  He was d/c in stable condition w UTI.  Elyse Jarvis, MD 01/01/12 [phone]

## 2012-01-01 NOTE — ED Notes (Signed)
Pt ambulated to BR attempted to urinate with no success.

## 2012-01-08 ENCOUNTER — Encounter: Payer: Self-pay | Admitting: Family

## 2012-01-08 ENCOUNTER — Ambulatory Visit (INDEPENDENT_AMBULATORY_CARE_PROVIDER_SITE_OTHER): Payer: Medicare Other | Admitting: Family

## 2012-01-08 VITALS — BP 120/80 | HR 110 | Temp 98.6°F | Wt 173.0 lb

## 2012-01-08 DIAGNOSIS — N390 Urinary tract infection, site not specified: Secondary | ICD-10-CM

## 2012-01-08 DIAGNOSIS — R4182 Altered mental status, unspecified: Secondary | ICD-10-CM

## 2012-01-08 LAB — POCT URINALYSIS DIPSTICK
Nitrite, UA: NEGATIVE
Urobilinogen, UA: 0.2
pH, UA: 7

## 2012-01-08 MED ORDER — SULFAMETHOXAZOLE-TRIMETHOPRIM 800-160 MG PO TABS: 1.0000 | ORAL_TABLET | Freq: Two times a day (BID) | ORAL | Status: DC

## 2012-01-08 NOTE — Progress Notes (Signed)
Subjective:    Patient ID: Mike Williams, male    DOB: 25-May-1932, 78 y.o.   MRN: [id number]  HPI  78 year old AAM is in as a ED follow-up. He presented with difficulty walking, incontinence, and increased confusion. He was found to have a UTI. He has began taking Cipro 500mg  twice a day for 5 days. He is much better today. Urine is clear. No frequency or urgency to urinate. No abdominal pain or back pain.    Review of Systems  Constitutional: Negative.   Respiratory: Negative.   Cardiovascular: Negative.   Gastrointestinal: Negative.   Genitourinary: Negative.   Skin: Negative.   Hematological: Negative.   Psychiatric/Behavioral: Negative.    Past Medical History  Diagnosis Date  . CEREBROVASCULAR ACCIDENT, HX OF 02/09/2007  . DEMENTIA 05/12/2007  . HYPERGLYCEMIA 09/21/2009  . HYPERTENSION 12/29/2006  . OBSTRUCTIVE SLEEP APNEA 12/29/2006  . PROSTATE CANCER, HX OF 12/29/2006  . Hemorrhoids   . Lactose intolerance   . Risk for falls   . Prostatitis   . Aortic root dilatation     Noted on 09/2010 echo, with mild-mod AI    History   Social History  . Marital Status: Married    Spouse Name: N/A    Number of Children: N/A  . Years of Education: N/A   Occupational History  . Not on file.   Social History Main Topics  . Smoking status: Never Smoker   . Smokeless tobacco: Not on file  . Alcohol Use:   . Drug Use:   . Sexually Active:    Other Topics Concern  . Not on file   Social History Narrative  . No narrative on file    Past Surgical History  Procedure Date  . Cholecystectomy   . Prostate surgery     cryotherapy  . Ganglion cyst excision     Family History  Problem Relation Age of Onset  . Heart attack Father   . Heart disease Father   . Cancer Sister     No Known Allergies  Current Outpatient Prescriptions on File Prior to Visit  Medication Sig Dispense Refill  . aspirin 81 MG chewable tablet Chew 1 tablet (81 mg total) by mouth daily.  30 tablet  0    . ciprofloxacin (CIPRO) 500 MG tablet Take 1 tablet (500 mg total) by mouth every 12 (twelve) hours.  10 tablet  0  . clopidogrel (PLAVIX) 75 MG tablet Take 1 tablet (75 mg total) by mouth daily.  90 tablet  3  . darifenacin (ENABLEX) 7.5 MG 24 hr tablet Take 1 tablet (7.5 mg total) by mouth daily.  90 tablet  3  . felodipine (PLENDIL) 5 MG 24 hr tablet take 1 tablet by mouth once daily  30 tablet  5  . hydrALAZINE (APRESOLINE) 25 MG tablet Take 1 tablet (25 mg total) by mouth every 8 (eight) hours.  90 tablet  3  . levETIRAcetam (KEPPRA) 250 MG tablet Take 1 tablet (250 mg total) by mouth 2 (two) times daily.  180 tablet  3  . olmesartan-hydrochlorothiazide (BENICAR HCT) 40-12.5 MG per tablet Take 1 tablet by mouth daily.         BP 120/80  Pulse 110  Temp 98.6 F (37 C) (Oral)  Wt 173 lb (78.472 kg)  SpO2 97%chart    Objective:   Physical Exam  Constitutional: He appears well-developed and well-nourished.  Cardiovascular: Normal rate, regular rhythm and normal heart sounds.   Pulmonary/Chest: Effort  normal and breath sounds normal.  Abdominal: Soft. Bowel sounds are normal.  Neurological: He is alert.  Skin: Skin is warm and dry.  Psychiatric: He has a normal mood and affect.          Assessment & Plan:  Assessment: UTI, Altered Mental Status-improved  Plan: Continue Cipro. Call the office if symptoms worsen or persist. Recheck with PCP as scheduled and sooner as needed.

## 2012-01-08 NOTE — Patient Instructions (Addendum)

## 2012-01-11 ENCOUNTER — Emergency Department (HOSPITAL_COMMUNITY): Payer: Medicare Other

## 2012-01-11 ENCOUNTER — Inpatient Hospital Stay (HOSPITAL_COMMUNITY)
Admission: EM | Admit: 2012-01-11 | Discharge: 2012-01-16 | DRG: 178 | Disposition: A | Discharge status: Skilled Nursing Facility | Payer: Medicare Other | Attending: Internal Medicine | Admitting: Internal Medicine

## 2012-01-11 ENCOUNTER — Encounter (HOSPITAL_COMMUNITY): Payer: Self-pay | Admitting: Emergency Medicine

## 2012-01-11 ENCOUNTER — Inpatient Hospital Stay (HOSPITAL_COMMUNITY): Payer: Medicare Other

## 2012-01-11 DIAGNOSIS — N10 Acute tubulo-interstitial nephritis: Secondary | ICD-10-CM | POA: Diagnosis not present

## 2012-01-11 DIAGNOSIS — R509 Fever, unspecified: Secondary | ICD-10-CM | POA: Diagnosis not present

## 2012-01-11 DIAGNOSIS — Y92009 Unspecified place in unspecified non-institutional (private) residence as the place of occurrence of the external cause: Secondary | ICD-10-CM

## 2012-01-11 DIAGNOSIS — J690 Pneumonitis due to inhalation of food and vomit: Principal | ICD-10-CM | POA: Diagnosis present

## 2012-01-11 DIAGNOSIS — Z9181 History of falling: Secondary | ICD-10-CM | POA: Diagnosis not present

## 2012-01-11 DIAGNOSIS — Z87440 Personal history of urinary (tract) infections: Secondary | ICD-10-CM

## 2012-01-11 DIAGNOSIS — Z8673 Personal history of transient ischemic attack (TIA), and cerebral infarction without residual deficits: Secondary | ICD-10-CM | POA: Diagnosis not present

## 2012-01-11 DIAGNOSIS — N318 Other neuromuscular dysfunction of bladder: Secondary | ICD-10-CM | POA: Diagnosis not present

## 2012-01-11 DIAGNOSIS — G459 Transient cerebral ischemic attack, unspecified: Secondary | ICD-10-CM | POA: Diagnosis not present

## 2012-01-11 DIAGNOSIS — F0280 Dementia in other diseases classified elsewhere without behavioral disturbance: Secondary | ICD-10-CM | POA: Diagnosis present

## 2012-01-11 DIAGNOSIS — R1312 Dysphagia, oropharyngeal phase: Secondary | ICD-10-CM | POA: Diagnosis not present

## 2012-01-11 DIAGNOSIS — J9819 Other pulmonary collapse: Secondary | ICD-10-CM | POA: Diagnosis not present

## 2012-01-11 DIAGNOSIS — Z8546 Personal history of malignant neoplasm of prostate: Secondary | ICD-10-CM | POA: Diagnosis not present

## 2012-01-11 DIAGNOSIS — N179 Acute kidney failure, unspecified: Secondary | ICD-10-CM | POA: Diagnosis not present

## 2012-01-11 DIAGNOSIS — N12 Tubulo-interstitial nephritis, not specified as acute or chronic: Secondary | ICD-10-CM | POA: Diagnosis not present

## 2012-01-11 DIAGNOSIS — N181 Chronic kidney disease, stage 1: Secondary | ICD-10-CM | POA: Diagnosis present

## 2012-01-11 DIAGNOSIS — G309 Alzheimer's disease, unspecified: Secondary | ICD-10-CM | POA: Diagnosis present

## 2012-01-11 DIAGNOSIS — I10 Essential (primary) hypertension: Secondary | ICD-10-CM | POA: Diagnosis not present

## 2012-01-11 DIAGNOSIS — F0390 Unspecified dementia without behavioral disturbance: Secondary | ICD-10-CM | POA: Diagnosis present

## 2012-01-11 DIAGNOSIS — I359 Nonrheumatic aortic valve disorder, unspecified: Secondary | ICD-10-CM | POA: Diagnosis present

## 2012-01-11 DIAGNOSIS — N390 Urinary tract infection, site not specified: Secondary | ICD-10-CM | POA: Diagnosis not present

## 2012-01-11 DIAGNOSIS — I129 Hypertensive chronic kidney disease with stage 1 through stage 4 chronic kidney disease, or unspecified chronic kidney disease: Secondary | ICD-10-CM | POA: Diagnosis present

## 2012-01-11 DIAGNOSIS — W010XXA Fall on same level from slipping, tripping and stumbling without subsequent striking against object, initial encounter: Secondary | ICD-10-CM | POA: Diagnosis present

## 2012-01-11 DIAGNOSIS — N201 Calculus of ureter: Secondary | ICD-10-CM | POA: Diagnosis not present

## 2012-01-11 DIAGNOSIS — R569 Unspecified convulsions: Secondary | ICD-10-CM | POA: Diagnosis not present

## 2012-01-11 DIAGNOSIS — I69959 Hemiplegia and hemiparesis following unspecified cerebrovascular disease affecting unspecified side: Secondary | ICD-10-CM | POA: Diagnosis not present

## 2012-01-11 DIAGNOSIS — I351 Nonrheumatic aortic (valve) insufficiency: Secondary | ICD-10-CM | POA: Diagnosis present

## 2012-01-11 DIAGNOSIS — Z5189 Encounter for other specified aftercare: Secondary | ICD-10-CM | POA: Diagnosis not present

## 2012-01-11 DIAGNOSIS — M25559 Pain in unspecified hip: Secondary | ICD-10-CM | POA: Diagnosis not present

## 2012-01-11 LAB — COMPREHENSIVE METABOLIC PANEL
Albumin: 3.7 g/dL (ref 3.5–5.2)
BUN: 14 mg/dL (ref 6–23)
Chloride: 96 mEq/L (ref 96–112)
Creatinine, Ser: 1.43 mg/dL — ABNORMAL HIGH (ref 0.50–1.35)
GFR calc Af Amer: 53 mL/min — ABNORMAL LOW (ref >90)
Glucose, Bld: 112 mg/dL — ABNORMAL HIGH (ref 70–99)
Total Bilirubin: 0.5 mg/dL (ref 0.3–1.2)
Total Protein: 8.1 g/dL (ref 6.0–8.3)

## 2012-01-11 LAB — LACTIC ACID, PLASMA: Lactic Acid, Venous: 1.3 mmol/L (ref 0.5–2.2)

## 2012-01-11 LAB — CBC
HCT: 41.2 % (ref 39.0–52.0)
Hemoglobin: 14.5 g/dL (ref 13.0–17.0)
Hemoglobin: 13.1 g/dL (ref 13.0–17.0)
MCH: 28.5 pg (ref 26.0–34.0)
MCHC: 35.2 g/dL (ref 30.0–36.0)
MCHC: 34.9 g/dL (ref 30.0–36.0)
MCV: 80.9 fL (ref 78.0–100.0)
Platelets: 238 10*3/uL (ref 150–400)
RDW: 13.7 % (ref 11.5–15.5)

## 2012-01-11 LAB — URINALYSIS, ROUTINE W REFLEX MICROSCOPIC
Glucose, UA: NEGATIVE mg/dL
Ketones, ur: NEGATIVE mg/dL
Nitrite: NEGATIVE
Protein, ur: 30 mg/dL — AB

## 2012-01-11 LAB — URINE MICROSCOPIC-ADD ON

## 2012-01-11 LAB — POCT I-STAT, CHEM 8
Glucose, Bld: 112 mg/dL — ABNORMAL HIGH (ref 70–99)
HCT: 45 % (ref 39.0–52.0)
Hemoglobin: 15.3 g/dL (ref 13.0–17.0)
Potassium: 3.7 mEq/L (ref 3.5–5.1)
Sodium: 139 mEq/L (ref 135–145)

## 2012-01-11 LAB — HEPATIC FUNCTION PANEL
Albumin: 3.3 g/dL — ABNORMAL LOW (ref 3.5–5.2)
Total Protein: 7.2 g/dL (ref 6.0–8.3)

## 2012-01-11 LAB — MAGNESIUM: Magnesium: 1.9 mg/dL (ref 1.5–2.5)

## 2012-01-11 LAB — TROPONIN I
Troponin I: <0.3 ng/mL (ref <0.30)
Troponin I: <0.3 ng/mL (ref <0.30)
Troponin I: <0.3 ng/mL (ref <0.30)

## 2012-01-11 MED ORDER — HYDRALAZINE HCL 25 MG PO TABS
25.0000 mg | ORAL_TABLET | Freq: Three times a day (TID) | ORAL | Status: DC
  Administered 2012-01-11 – 2012-01-16 (×15): 25 mg via ORAL
  Filled 2012-01-11 (×20): qty 1

## 2012-01-11 MED ORDER — DEXTROSE 5 % IV SOLN
1.0000 g | INTRAVENOUS | Status: DC
  Filled 2012-01-11: qty 10

## 2012-01-11 MED ORDER — SODIUM CHLORIDE 0.9 % IV SOLN
INTRAVENOUS | Status: DC
  Administered 2012-01-11 – 2012-01-15 (×6): via INTRAVENOUS

## 2012-01-11 MED ORDER — ACETAMINOPHEN 325 MG PO TABS
650.0000 mg | ORAL_TABLET | Freq: Four times a day (QID) | ORAL | Status: DC | PRN
  Administered 2012-01-11 (×2): 650 mg via ORAL
  Filled 2012-01-11 (×3): qty 2

## 2012-01-11 MED ORDER — DEXTROSE 5 % IV SOLN
1.0000 g | Freq: Once | INTRAVENOUS | Status: AC
  Administered 2012-01-11: 1 g via INTRAVENOUS
  Filled 2012-01-11: qty 10

## 2012-01-11 MED ORDER — ONDANSETRON HCL 4 MG/2ML IJ SOLN: 4.0000 mg | Freq: Four times a day (QID) | INTRAMUSCULAR | Status: DC | PRN

## 2012-01-11 MED ORDER — ENOXAPARIN SODIUM 40 MG/0.4ML ~~LOC~~ SOLN
40.0000 mg | SUBCUTANEOUS | Status: DC
  Administered 2012-01-11 – 2012-01-16 (×6): 40 mg via SUBCUTANEOUS
  Filled 2012-01-11 (×6): qty 0.4

## 2012-01-11 MED ORDER — ACETAMINOPHEN 325 MG PO TABS
650.0000 mg | ORAL_TABLET | Freq: Once | ORAL | Status: AC
  Administered 2012-01-11: 650 mg via ORAL
  Filled 2012-01-11: qty 2

## 2012-01-11 MED ORDER — SODIUM CHLORIDE 0.9 % IJ SOLN
3.0000 mL | Freq: Two times a day (BID) | INTRAMUSCULAR | Status: DC
  Administered 2012-01-11 – 2012-01-16 (×6): 3 mL via INTRAVENOUS

## 2012-01-11 MED ORDER — ONDANSETRON HCL 4 MG PO TABS: 4.0000 mg | ORAL_TABLET | Freq: Four times a day (QID) | ORAL | Status: DC | PRN

## 2012-01-11 MED ORDER — ASPIRIN 81 MG PO CHEW
81.0000 mg | CHEWABLE_TABLET | Freq: Every day | ORAL | Status: DC
  Administered 2012-01-11 – 2012-01-16 (×6): 81 mg via ORAL
  Filled 2012-01-11 (×6): qty 1

## 2012-01-11 MED ORDER — DARIFENACIN HYDROBROMIDE ER 7.5 MG PO TB24
7.5000 mg | ORAL_TABLET | Freq: Every day | ORAL | Status: DC
  Administered 2012-01-11 – 2012-01-16 (×6): 7.5 mg via ORAL
  Filled 2012-01-11 (×6): qty 1

## 2012-01-11 MED ORDER — PIPERACILLIN-TAZOBACTAM 3.375 G IVPB
3.3750 g | Freq: Three times a day (TID) | INTRAVENOUS | Status: DC
  Administered 2012-01-11 – 2012-01-16 (×14): 3.375 g via INTRAVENOUS
  Filled 2012-01-11 (×15): qty 50

## 2012-01-11 MED ORDER — ACETAMINOPHEN 650 MG RE SUPP: 650.0000 mg | Freq: Four times a day (QID) | RECTAL | Status: DC | PRN

## 2012-01-11 MED ORDER — LEVALBUTEROL HCL 0.63 MG/3ML IN NEBU
0.6300 mg | INHALATION_SOLUTION | Freq: Four times a day (QID) | RESPIRATORY_TRACT | Status: DC | PRN
  Filled 2012-01-11: qty 3

## 2012-01-11 MED ORDER — LEVETIRACETAM 250 MG PO TABS
250.0000 mg | ORAL_TABLET | Freq: Two times a day (BID) | ORAL | Status: DC
  Administered 2012-01-11 – 2012-01-16 (×11): 250 mg via ORAL
  Filled 2012-01-11 (×14): qty 1

## 2012-01-11 MED ORDER — CLOPIDOGREL BISULFATE 75 MG PO TABS
75.0000 mg | ORAL_TABLET | Freq: Every day | ORAL | Status: DC
  Administered 2012-01-11 – 2012-01-16 (×6): 75 mg via ORAL
  Filled 2012-01-11 (×6): qty 1

## 2012-01-11 NOTE — Progress Notes (Signed)
ANTIBIOTIC CONSULT NOTE - INITIAL  Pharmacy Consult for Zosyn Indication: UTI  No Known Allergies  Patient Measurements: Height: 5\' 7"  (170.2 cm) Weight: 173 lb (78.472 kg) IBW/kg (Calculated) : 66.1   Vital Signs: Temp: 101.3 F (38.5 C) (09/08 2052) Temp src: Oral (09/08 2052) BP: 176/82 mmHg (09/08 1444) Pulse Rate: 87  (09/08 1444) Intake/Output from previous day:   Intake/Output from this shift:    Labs:  Basename 01/11/12 1152 01/11/12 0616 01/11/12 0545  WBC 5.7 -- 8.4  HGB 13.1 15.3 14.5  PLT 238 -- 249  LABCREA -- -- --  CREATININE -- 1.60* 1.43*   Estimated Creatinine Clearance: 36.7 ml/min (by C-G formula based on Cr of 1.6). No results found for this basename: VANCOTROUGH:2,VANCOPEAK:2,VANCORANDOM:2,GENTTROUGH:2,GENTPEAK:2,GENTRANDOM:2,TOBRATROUGH:2,TOBRAPEAK:2,TOBRARND:2,AMIKACINPEAK:2,AMIKACINTROU:2,AMIKACIN:2, in the last 72 hours   Microbiology: Recent Results (from the past 720 hour(s))  URINE CULTURE     Status: Normal   Collection Time   01/08/12 11:22 AM      Component Value Range Status Comment   Colony Count NO GROWTH   Final    Organism ID, Bacteria NO GROWTH   Final     Medical History: Past Medical History  Diagnosis Date  . CEREBROVASCULAR ACCIDENT, HX OF 02/09/2007  . DEMENTIA 05/12/2007  . HYPERGLYCEMIA 09/21/2009  . HYPERTENSION 12/29/2006  . OBSTRUCTIVE SLEEP APNEA 12/29/2006  . PROSTATE CANCER, HX OF 12/29/2006  . Hemorrhoids   . Lactose intolerance   . Risk for falls   . Prostatitis   . Aortic root dilatation     Noted on 09/2010 echo, with mild-mod AI    Medications:  Scheduled:    . acetaminophen  650 mg Oral Once  . aspirin  81 mg Oral Daily  . cefTRIAXone (ROCEPHIN)  IV  1 g Intravenous Once  . clopidogrel  75 mg Oral Daily  . darifenacin  7.5 mg Oral Daily  . enoxaparin (LOVENOX) injection  40 mg Subcutaneous Q24H  . hydrALAZINE  25 mg Oral Q8H  . levETIRAcetam  250 mg Oral BID  . sodium chloride  3 mL Intravenous  Q12H  . DISCONTD: cefTRIAXone (ROCEPHIN)  IV  1 g Intravenous Q24H   Infusions:    . sodium chloride 125 mL/hr at 01/11/12 1836   Assessment: 78 yo male presented to ER with fever and hx multiple UTI's including recent UTI treated with Cipro and Bactrim to start Zosyn per pharmacy dosing  Goal of Therapy:  eradication of infection  Plan:  Zosyn 3.375g IV q8 (extended interval infusion)   Hessie Knows, PharmD, BCPS Pager [phone] 01/11/2012 9:45 PM

## 2012-01-11 NOTE — ED Notes (Signed)
Pt alert, arrives from home, c/o cont UTI s/s, fever, AMS, resp even unlabored, skin pwd

## 2012-01-11 NOTE — H&P (Signed)
[facility] History and Physical  Mike Williams ONG:[id number] DOB: 04-Aug-1932 DOA: 01/11/2012  Referring physician: Sunnie Nielsen, MD  PCP: [name], MD   Chief Complaint: Fever, confusion HPI:  This is a 78 year old male with a history of prostrate surgery due to prostrate cancer, multiple urinary tract infections, recent UTI on ciprofloxacin and Bactrim at home who presents to the ER with a fever of 101F as well as weakness. Yesterday the patient fell trying to get to the bathroom. He has not sustained any apparent injuries. He has had some intermittent confusion. No chest pain no cough no shortness of breath.      Review of Systems: negative for the following   Constitutional: Chills, positive for Fever, Unexplained weight loss, Unexplained recent fatigue, Change in appetite  Eyes: Blurred vision, redness in eyes, pain in eyes  Ears, Nose, Mouth, Throat: Unexplained decreased hearing, sore throat swollen glands  Cardiovascular: Chest pain, irregular heartbeat, frequent dizziness upon standing up, shortness of breath lying down  Respiratory: Cough, wheezing, shortness of breath   Gastrointestinal: Moderate to severe abdominal pain, diarrhea, blood in stool  Genitourinary: Blood in urine, difficulty urinating. Women only, change in menstrual period, irregular menstrual periods, excessively heavy bleeding   Musculoskeletal: Arthritis, abnormal weakness of muscles   Breasts: New breast lumps, nipple discharge or irritation   Neurological: Dizziness, numbing/tingling, loss of balance when walking   Psychiatric: Depression, mood swings, significant and unexplained memory loss  Endocrine: Excessively hot or cold, unexplained weight loss or gain   Hematologic/Lymphatic: Swollen glands, easy bruising   Allergic/Immunologic: Hives, runny nose, sneezing      Past Medical History  Diagnosis Date  . CEREBROVASCULAR ACCIDENT, HX OF 02/09/2007  . DEMENTIA 05/12/2007  .  HYPERGLYCEMIA 09/21/2009  . HYPERTENSION 12/29/2006  . OBSTRUCTIVE SLEEP APNEA 12/29/2006  . PROSTATE CANCER, HX OF 12/29/2006  . Hemorrhoids   . Lactose intolerance   . Risk for falls   . Prostatitis   . Aortic root dilatation     Noted on 09/2010 echo, with mild-mod AI     Past Surgical History  Procedure Date  . Cholecystectomy   . Prostate surgery     cryotherapy  . Ganglion cyst excision      FAMILY MEDICAL HISTORY: Both parents are passed away, etiology unknown.  The patient has 2 brothers, 1 sister. Both brothers have hypertension.  One sister has cancer and passed away with cancer.   SOCIAL HISTORY: The patient is married, he is retired, lives with his  wife and lives in the [place], [place] area. Does not smoke  or drink.   No Known Allergies  Family History  Problem Relation Age of Onset  . Heart attack Father   . Heart disease Father   . Cancer Sister      Prior to Admission medications   Medication Sig Start Date End Date Taking? Authorizing Provider  aspirin 81 MG chewable tablet Chew 1 tablet (81 mg total) by mouth daily. 07/05/11 07/04/12 Yes Meredeth Ide, MD  ciprofloxacin (CIPRO) 500 MG tablet Take 1 tablet (500 mg total) by mouth every 12 (twelve) hours. 01/01/12 01/11/12 Yes Gerhard Munch, MD  clopidogrel (PLAVIX) 75 MG tablet Take 1 tablet (75 mg total) by mouth daily. 11/24/11  Yes Lindley Magnus, MD  darifenacin (ENABLEX) 7.5 MG 24 hr tablet Take 1 tablet (7.5 mg total) by mouth daily. 11/24/11  Yes Lindley Magnus, MD  felodipine (PLENDIL) 5 MG 24 hr tablet take 1 tablet  by mouth once daily 10/17/11  Yes Bruce Romilda Garret, MD  hydrALAZINE (APRESOLINE) 25 MG tablet Take 1 tablet (25 mg total) by mouth every 8 (eight) hours. 11/24/11 11/23/12 Yes Bruce Romilda Garret, MD  levETIRAcetam (KEPPRA) 250 MG tablet Take 1 tablet (250 mg total) by mouth 2 (two) times daily. 11/24/11  Yes Bruce Romilda Garret, MD  olmesartan-hydrochlorothiazide (BENICAR HCT) 40-12.5 MG per  tablet Take 1 tablet by mouth daily.  04/25/11  Yes Lindley Magnus, MD  sulfamethoxazole-trimethoprim (BACTRIM DS,SEPTRA DS) 800-160 MG per tablet Take 1 tablet by mouth 2 (two) times daily. 01/08/12 01/18/12 Yes Baker Pierini, FNP     Physical Exam: Filed Vitals:   01/11/12 0437 01/11/12 0630 01/11/12 0658 01/11/12 0805  BP:  150/73  133/73  Pulse:  76  71  Temp: 101.3 F (38.5 C)  99.8 F (37.7 C)   TempSrc: Rectal  Rectal   Resp:  14  14  Height:      Weight:      SpO2:  98%  97%    Constitutional: He appears well-developed and well-nourished.  HENT:  Head: Normocephalic and atraumatic.  Eyes: Conjunctivae and EOM are normal. Pupils are equal, round, and reactive to light.  Neck: Trachea normal. Neck supple. No thyromegaly present.  Cardiovascular: Normal rate, regular rhythm, S1 normal, S2 normal and normal pulses.  No systolic murmur is present  No diastolic murmur is present  Pulses:  Radial pulses are 2+ on the right side, and 2+ on the left side.  Pulmonary/Chest: Effort normal and breath sounds normal. He has no wheezes. He has no rhonchi. He has no rales. He exhibits no tenderness.  Abdominal: Soft. Normal appearance and bowel sounds are normal. There is no tenderness. There is no CVA tenderness and negative Murphy's sign.  Musculoskeletal:  BLE:s Calves nontender, no cords or erythema, negative Homans sign  Neurological: He is alert. He has normal strength. No cranial nerve deficit or sensory deficit. GCS eye subscore is 4. GCS verbal subscore is 5. GCS motor subscore is 6.  Skin: Skin is warm and dry. No rash noted. He is not diaphoretic.  Psychiatric: His speech is normal.  Cooperative and appropriate       Labs on Admission:    Basic Metabolic Panel:  Lab 01/11/12 1610 01/11/12 0545  NA 139 134*  K 3.7 3.7  CL 101 96  CO2 -- 27  GLUCOSE 112* 112*  BUN 15 14  CREATININE 1.60* 1.43*  CALCIUM -- 9.5  MG -- --  PHOS -- --   Liver Function  Tests:  Lab 01/11/12 0545  AST 21  ALT 16  ALKPHOS 84  BILITOT 0.5  PROT 8.1  ALBUMIN 3.7   No results found for this basename: LIPASE:5,AMYLASE:5 in the last 168 hours No results found for this basename: AMMONIA:5 in the last 168 hours CBC:  Lab 01/11/12 0616 01/11/12 0545  WBC -- 8.4  NEUTROABS -- --  HGB 15.3 14.5  HCT 45.0 41.2  MCV -- 80.9  PLT -- 249   Cardiac Enzymes: No results found for this basename: CKTOTAL:5,CKMB:5,CKMBINDEX:5,TROPONINI:5 in the last 168 hours  BNP (last 3 results) No results found for this basename: PROBNP:3 in the last 8760 hours    CBG: No results found for this basename: GLUCAP:5 in the last 168 hours  Radiological Exams on Admission: Dg Chest 2 View  01/11/2012  *RADIOLOGY REPORT*  Clinical Data: Fever.  UTI.  CHEST - 2 VIEW  Comparison:  12/07/2009.  Findings: Shallow inspiration with elevation of the left hemidiaphragm.  Heart size and pulmonary vascularity are normal for technique.  No focal airspace consolidation.  No blunting of costophrenic angles.  No pneumothorax.  Mediastinal contours appear intact.  Tortuous aorta.  Probable aortic ectasia.  Surgical clips in the right upper quadrant.  Probably no significant change since previous study, allowing for different inspiratory effort.  IMPRESSION: Tortuous and ectatic aorta.  Shallow inspiration.  No active pulmonary disease.   Original Report Authenticated By: Marlon Pel, [name]     EKG: Independently reviewed. None  Assessment/Plan Principal Problem:  *Pyelonephritis Active Problems:  DEMENTIA  HYPERTENSION  PROSTATE CANCER, HX OF  CEREBROVASCULAR ACCIDENT, HX OF  Chronic renal insufficiency, stage I  Aortic insufficiency   1. Recurrent UTI/pyelonephritis. The patient has received IV Rocephin in the ER. We'll draw blood cultures x2. Order a CT stone protocol because of elevated creatinine. The patient does not have any gross hematuria. 2. Hypertension continue to hold  some of his antihypertensive medications with the exception of hydralazine 3. Recent fall likely secondary to dehydration/orthostasis/weakness in the setting of his UTI/pyelonephritis. Will obtain PT OT consultation and the patient is more stable 4. History of CVA continue Plavix and aspirin 5. Dementia. Contribution to his intermittent falls and altered mental status. Continue to monitor  Code Status:   full Family Communication: bedside Disposition Plan: admit   Time spent: 70 mins   [facility] Pager [phone]  If 7PM-7AM, please contact night-coverage [email or web address] Password [facility] 01/11/2012, 8:53 AM

## 2012-01-11 NOTE — ED Notes (Signed)
IV attempt x 2. Charge RN agreed to attempt.

## 2012-01-11 NOTE — ED Notes (Signed)
Called report to Heather, RN

## 2012-01-11 NOTE — ED Notes (Signed)
Per pt's family the pt has been treated both here in the ED on 8/29 and at his PCP for a recent UTI. However, his symptoms have not improved.

## 2012-01-11 NOTE — ED Provider Notes (Signed)
History     CSN: [id number]  Arrival date & time 01/11/12  2440   First MD Initiated Contact with Patient 01/11/12 [phone]      Chief Complaint  Patient presents with  . Urinary Tract Infection    (Consider location/radiation/quality/duration/timing/severity/associated sxs/prior treatment) HPI Hx per PT and wife bedside. Hx of prostate surgery and multiple UTIs, developed fever last  Night, fell trying to get to the bathroom, no apparent injury. Wife concerned he has another UTI. PT denies any complaints. Some AMS per wife. No weakness/ numbness. No vomiitng. No CP, cough or SOB.  Past Medical History  Diagnosis Date  . CEREBROVASCULAR ACCIDENT, HX OF 02/09/2007  . DEMENTIA 05/12/2007  . HYPERGLYCEMIA 09/21/2009  . HYPERTENSION 12/29/2006  . OBSTRUCTIVE SLEEP APNEA 12/29/2006  . PROSTATE CANCER, HX OF 12/29/2006  . Hemorrhoids   . Lactose intolerance   . Risk for falls   . Prostatitis   . Aortic root dilatation     Noted on 09/2010 echo, with mild-mod AI    Past Surgical History  Procedure Date  . Cholecystectomy   . Prostate surgery     cryotherapy  . Ganglion cyst excision     Family History  Problem Relation Age of Onset  . Heart attack Father   . Heart disease Father   . Cancer Sister     History  Substance Use Topics  . Smoking status: Never Smoker   . Smokeless tobacco: Not on file  . Alcohol Use:       Review of Systems  Constitutional: Positive for fever.  HENT: Negative for neck pain and neck stiffness.   Eyes: Negative for pain.  Respiratory: Negative for shortness of breath.   Cardiovascular: Negative for chest pain.  Gastrointestinal: Negative for abdominal pain.  Genitourinary: Positive for frequency. Negative for flank pain.  Musculoskeletal: Negative for back pain.  Skin: Negative for rash.  Neurological: Negative for headaches.  All other systems reviewed and are negative.    Allergies  Review of patient's allergies indicates no known  allergies.  Home Medications   Current Outpatient Rx  Name Route Sig Dispense Refill  . ASPIRIN 81 MG PO CHEW Oral Chew 1 tablet (81 mg total) by mouth daily. 30 tablet 0  . CIPROFLOXACIN HCL 500 MG PO TABS Oral Take 1 tablet (500 mg total) by mouth every 12 (twelve) hours. 10 tablet 0  . CLOPIDOGREL BISULFATE 75 MG PO TABS Oral Take 1 tablet (75 mg total) by mouth daily. 90 tablet 3  . DARIFENACIN HYDROBROMIDE ER 7.5 MG PO TB24 Oral Take 1 tablet (7.5 mg total) by mouth daily. 90 tablet 3  . FELODIPINE ER 5 MG PO TB24  take 1 tablet by mouth once daily 30 tablet 5  . HYDRALAZINE HCL 25 MG PO TABS Oral Take 1 tablet (25 mg total) by mouth every 8 (eight) hours. 90 tablet 3  . LEVETIRACETAM 250 MG PO TABS Oral Take 1 tablet (250 mg total) by mouth 2 (two) times daily. 180 tablet 3  . OLMESARTAN MEDOXOMIL-HCTZ 40-12.5 MG PO TABS Oral Take 1 tablet by mouth daily.     . SULFAMETHOXAZOLE-TRIMETHOPRIM 800-160 MG PO TABS Oral Take 1 tablet by mouth 2 (two) times daily. 20 tablet 0    BP 150/73  Pulse 76  Temp 99.8 F (37.7 C) (Rectal)  Resp 14  Ht 5\' 7"  (1.702 m)  Wt 173 lb (78.472 kg)  BMI 27.10 kg/m2  SpO2 98%  Physical Exam  Constitutional:  He appears well-developed and well-nourished.  HENT:  Head: Normocephalic and atraumatic.  Eyes: Conjunctivae and EOM are normal. Pupils are equal, round, and reactive to light.  Neck: Trachea normal. Neck supple. No thyromegaly present.  Cardiovascular: Normal rate, regular rhythm, S1 normal, S2 normal and normal pulses.     No systolic murmur is present   No diastolic murmur is present  Pulses:      Radial pulses are 2+ on the right side, and 2+ on the left side.  Pulmonary/Chest: Effort normal and breath sounds normal. He has no wheezes. He has no rhonchi. He has no rales. He exhibits no tenderness.  Abdominal: Soft. Normal appearance and bowel sounds are normal. There is no tenderness. There is no CVA tenderness and negative Murphy's sign.   Musculoskeletal:       BLE:s Calves nontender, no cords or erythema, negative Homans sign  Neurological: He is alert. He has normal strength. No cranial nerve deficit or sensory deficit. GCS eye subscore is 4. GCS verbal subscore is 5. GCS motor subscore is 6.  Skin: Skin is warm and dry. No rash noted. He is not diaphoretic.  Psychiatric: His speech is normal.       Cooperative and appropriate    ED Course  Procedures (including critical care time)  Results for orders placed during the hospital encounter of 01/11/12  CBC      Component Value Range   WBC 8.4  4.0 - 10.5 K/uL   RBC 5.09  4.22 - 5.81 MIL/uL   Hemoglobin 14.5  13.0 - 17.0 g/dL   HCT 14.7  82.9 - 56.2 %   MCV 80.9  78.0 - 100.0 fL   MCH 28.5  26.0 - 34.0 pg   MCHC 35.2  30.0 - 36.0 g/dL   RDW 13.0  86.5 - 78.4 %   Platelets 249  150 - 400 K/uL  COMPREHENSIVE METABOLIC PANEL      Component Value Range   Sodium 134 (*) 135 - 145 mEq/L   Potassium 3.7  3.5 - 5.1 mEq/L   Chloride 96  96 - 112 mEq/L   CO2 27  19 - 32 mEq/L   Glucose, Bld 112 (*) 70 - 99 mg/dL   BUN 14  6 - 23 mg/dL   Creatinine, Ser 6.96 (*) 0.50 - 1.35 mg/dL   Calcium 9.5  8.4 - 29.5 mg/dL   Total Protein 8.1  6.0 - 8.3 g/dL   Albumin 3.7  3.5 - 5.2 g/dL   AST 21  0 - 37 U/L   ALT 16  0 - 53 U/L   Alkaline Phosphatase 84  39 - 117 U/L   Total Bilirubin 0.5  0.3 - 1.2 mg/dL   GFR calc non Af Amer 46 (*) >90 mL/min   GFR calc Af Amer 53 (*) >90 mL/min  URINALYSIS, ROUTINE W REFLEX MICROSCOPIC      Component Value Range   Color, Urine YELLOW  YELLOW   APPearance CLOUDY (*) CLEAR   Specific Gravity, Urine 1.028  1.005 - 1.030   pH 6.5  5.0 - 8.0   Glucose, UA NEGATIVE  NEGATIVE mg/dL   Hgb urine dipstick NEGATIVE  NEGATIVE   Bilirubin Urine NEGATIVE  NEGATIVE   Ketones, ur NEGATIVE  NEGATIVE mg/dL   Protein, ur 30 (*) NEGATIVE mg/dL   Urobilinogen, UA 1.0  0.0 - 1.0 mg/dL   Nitrite NEGATIVE  NEGATIVE   Leukocytes, UA LARGE (*) NEGATIVE    LACTIC  ACID, PLASMA      Component Value Range   Lactic Acid, Venous 1.3  0.5 - 2.2 mmol/L  POCT I-STAT, CHEM 8      Component Value Range   Sodium 139  135 - 145 mEq/L   Potassium 3.7  3.5 - 5.1 mEq/L   Chloride 101  96 - 112 mEq/L   BUN 15  6 - 23 mg/dL   Creatinine, Ser 1.61 (*) 0.50 - 1.35 mg/dL   Glucose, Bld 096 (*) 70 - 99 mg/dL   Calcium, Ion 0.45  4.09 - 1.30 mmol/L   TCO2 27  0 - 100 mmol/L   Hemoglobin 15.3  13.0 - 17.0 g/dL   HCT 81.1  91.4 - 78.2 %  URINE MICROSCOPIC-ADD ON      Component Value Range   WBC, UA TOO NUMEROUS TO COUNT  <3 WBC/hpf   Bacteria, UA RARE  RARE   Urine-Other MUCOUS PRESENT     Dg Chest 2 View  01/11/2012  *RADIOLOGY REPORT*  Clinical Data: Fever.  UTI.  CHEST - 2 VIEW  Comparison: 12/07/2009.  Findings: Shallow inspiration with elevation of the left hemidiaphragm.  Heart size and pulmonary vascularity are normal for technique.  No focal airspace consolidation.  No blunting of costophrenic angles.  No pneumothorax.  Mediastinal contours appear intact.  Tortuous aorta.  Probable aortic ectasia.  Surgical clips in the right upper quadrant.  Probably no significant change since previous study, allowing for different inspiratory effort.  IMPRESSION: Tortuous and ectatic aorta.  Shallow inspiration.  No active pulmonary disease.   Original Report Authenticated By: Marlon Pel, [name]    IV rocephin. IVFs  Work up as above. U Cx pending.  crt elevated.  No hypotension. MED c/s for admit 2/2 fever and AMS with UTI.   Plan MED admit  MDM   VS and nursing notes and old records reviewed. IVFs, IV ABx and MED consult for admit        Sunnie Nielsen, MD 01/11/12 [phone]

## 2012-01-11 NOTE — Progress Notes (Signed)
Patient has been noted to cough intermittently since arriving to the floor and at times will sit up as if choking and spit up what appears to be just saliva.  Did have a coughing spell one time during supper but again just coughed up saliva, no food.  Was able to continue to eat feeding self.  Wife states patient has had trouble swallowing pills at home but not food.

## 2012-01-12 ENCOUNTER — Inpatient Hospital Stay (HOSPITAL_COMMUNITY): Payer: Medicare Other

## 2012-01-12 LAB — CBC
HCT: 35.7 % — ABNORMAL LOW (ref 39.0–52.0)
Hemoglobin: 12.2 g/dL — ABNORMAL LOW (ref 13.0–17.0)
MCH: 27.9 pg (ref 26.0–34.0)
MCHC: 34.2 g/dL (ref 30.0–36.0)
RBC: 4.37 MIL/uL (ref 4.22–5.81)

## 2012-01-12 LAB — BASIC METABOLIC PANEL
BUN: 11 mg/dL (ref 6–23)
CO2: 25 mEq/L (ref 19–32)
Chloride: 102 mEq/L (ref 96–112)
GFR calc non Af Amer: 44 mL/min — ABNORMAL LOW (ref >90)
Glucose, Bld: 105 mg/dL — ABNORMAL HIGH (ref 70–99)
Potassium: 3.8 mEq/L (ref 3.5–5.1)
Sodium: 137 mEq/L (ref 135–145)

## 2012-01-12 LAB — TROPONIN I: Troponin I: <0.3 ng/mL (ref <0.30)

## 2012-01-12 MED ORDER — VANCOMYCIN HCL 1000 MG IV SOLR
750.0000 mg | Freq: Two times a day (BID) | INTRAVENOUS | Status: DC
  Administered 2012-01-12 – 2012-01-15 (×7): 750 mg via INTRAVENOUS
  Filled 2012-01-12 (×9): qty 750

## 2012-01-12 NOTE — Evaluation (Signed)
Clinical/Bedside Swallow Evaluation Patient Details  Name: Mike Williams MRN: [id number] Date of Birth: Apr 07, 1933  Today's Date: 01/12/2012 Time: 8469-6295 SLP Time Calculation (min): 46 min  Past Medical History:  Past Medical History  Diagnosis Date  . CEREBROVASCULAR ACCIDENT, HX OF 02/09/2007  . DEMENTIA 05/12/2007  . HYPERGLYCEMIA 09/21/2009  . HYPERTENSION 12/29/2006  . OBSTRUCTIVE SLEEP APNEA 12/29/2006  . PROSTATE CANCER, HX OF 12/29/2006  . Hemorrhoids   . Lactose intolerance   . Risk for falls   . Prostatitis   . Aortic root dilatation     Noted on 09/2010 echo, with mild-mod AI   Past Surgical History:  Past Surgical History  Procedure Date  . Cholecystectomy   . Prostate surgery     cryotherapy  . Ganglion cyst excision    HPI:  78yo male adm to [facility] after fall at home, found to have fever and UTI.  PMH + CVA, dementia, prostate cancer.  CXR negative for focal infiltrate.     Assessment / Plan / Recommendation Clinical Impression  Pt presents with s/s of mild oral dysphagia likely due to cognitive deficit- delayed oral transiting and suspect minimal delayed pharyngeal swallow.  Pt noted to overtly cough when HOB lowered, ? trace asp or penetration of secretions. Spouse reports pt newly coughing with liquids during this admission.  Observed pt consuming breakfast - cheerios with banana, nectar juice, water without s/s of aspiration or penetration - delayed swallow noted and pt required cues to continue to feed himself.    Advised pt and spouse to aspiration  precautions and compensatory strategies.  Pt has not had weight loss nor pulmonary infections therefore appears to be tolerating po diet.  Spouse desires to cut pt's meats for him, so will leave diet as current.    Will follow up to assure spouse and pt understand information provided.  Thanks for referral.      Aspiration Risk  Mild    Diet Recommendation Regular;Thin liquid (wife to chop pt's meats)   Liquid  Administration via: Cup;Straw Medication Administration: Whole meds with puree Supervision: Full supervision/cueing for compensatory strategies Compensations: Slow rate;Small sips/bites;Check for pocketing Postural Changes and/or Swallow Maneuvers: Seated upright 90 degrees;Upright 30-60 min after meal    Other  Recommendations Oral Care Recommendations: Oral care before and after PO   Follow Up Recommendations       Frequency and Duration min 1 x/week  1 week   Pertinent Vitals/Pain Afebrile, decreased    SLP Swallow Goals Patient will utilize recommended strategies during swallow to increase swallowing safety with: Moderate cueing   Swallow Study Prior Functional Status       General Date of Onset: 01/12/12 HPI: 78yo male adm to [facility] after fall at home, found to have fever and UTI.  PMH + CVA, dementia, prostate cancer.  CXR negative for focal infiltrate.   Type of Study: Bedside swallow evaluation Previous Swallow Assessment: none Diet Prior to this Study: Regular;Thin liquids Temperature Spikes Noted: No Respiratory Status: Room air Behavior/Cognition: Alert;Requires cueing;Decreased sustained attention;Cooperative (pt with dementia) Oral Cavity - Dentition: Adequate natural dentition Self-Feeding Abilities: Able to feed self Patient Positioning: Upright in bed Baseline Vocal Quality: Clear Volitional Cough: Strong Volitional Swallow: Able to elicit    Oral/Motor/Sensory Function Overall Oral Motor/Sensory Function: Other (comment) (right facial, labial decr d/t previous cva- weak otherwise)   Ice Chips Ice chips: Not tested   Thin Liquid Thin Liquid: Impaired Presentation: Cup;Spoon;Straw;Self Fed Pharyngeal  Phase Impairments: Suspected delayed Swallow  Nectar Thick Nectar Thick Liquid: Impaired Presentation: Cup;Self Fed;Spoon;Straw Pharyngeal Phase Impairments: Suspected delayed Swallow   Honey Thick Honey Thick Liquid: Not tested   Puree Puree: Not tested     Solid   GO    Solid: Impaired Presentation: Self Fed Oral Phase Impairments: Other (comment) (slow mastication= functional) Pharyngeal Phase Impairments: Suspected delayed Fredric Mare, MS [facility] SLP [phone]   01/12/2012,9:56 AM

## 2012-01-12 NOTE — Progress Notes (Signed)
ANTIBIOTIC CONSULT NOTE - INITIAL  Pharmacy Consult for Vancomycin Indication: suspected PNA   No Known Allergies  Patient Measurements: Height: 5\' 7"  (170.2 cm) Weight: 181 lb 14.1 oz (82.5 kg) IBW/kg (Calculated) : 66.1   Vital Signs: Temp: 101.3 F (38.5 C) (09/09 1505) Temp src: Oral (09/09 1505) BP: 151/78 mmHg (09/09 1505) Pulse Rate: 79  (09/09 1505) Intake/Output from previous day: 09/08 0701 - 09/09 0700 In: 240 [P.O.:240] Out: -  Intake/Output from this shift: Total I/O In: 480 [P.O.:480] Out: -   Labs:  Basename 01/12/12 0450 01/11/12 1152 01/11/12 0616 01/11/12 0545  WBC 5.6 5.7 -- 8.4  HGB 12.2* 13.1 15.3 --  PLT 227 238 -- 249  LABCREA -- -- -- --  CREATININE 1.49* -- 1.60* 1.43*   Estimated Creatinine Clearance: 43.4 ml/min (by C-G formula based on Cr of 1.49). No results found for this basename: VANCOTROUGH:2,VANCOPEAK:2,VANCORANDOM:2,GENTTROUGH:2,GENTPEAK:2,GENTRANDOM:2,TOBRATROUGH:2,TOBRAPEAK:2,TOBRARND:2,AMIKACINPEAK:2,AMIKACINTROU:2,AMIKACIN:2, in the last 72 hours   Medical History: Past Medical History  Diagnosis Date  . CEREBROVASCULAR ACCIDENT, HX OF 02/09/2007  . DEMENTIA 05/12/2007  . HYPERGLYCEMIA 09/21/2009  . HYPERTENSION 12/29/2006  . OBSTRUCTIVE SLEEP APNEA 12/29/2006  . PROSTATE CANCER, HX OF 12/29/2006  . Hemorrhoids   . Lactose intolerance   . Risk for falls   . Prostatitis   . Aortic root dilatation     Noted on 09/2010 echo, with mild-mod AI   Assessment:  29 yom with h/o multiple UTIs presented to ER with fever, Zosyn was started.  CXR 9/9 consistent with developing atelectasis and/or pneumonia in the lingula and left lower lobe, MD would like to add Vancomycin for PNA coverage.  Wt 82.5 kg, Scr 1.49, CrCl 43 (same normalized)  Pending blood and urine cultures.  Tmax 101.3, WBC wnl  Goal of Therapy:  Vancomycin trough level 15-20 mcg/ml  Plan:   Vancomycin 750 mg IV q12h  Pharmacy will f/u  Geoffry Paradise  Thi 01/12/2012,3:51 PM

## 2012-01-12 NOTE — Progress Notes (Signed)
[facility] PROGRESS NOTE  Mike Williams WUJ:[id number] DOB: [birth year] DOA: 01/11/2012 PCP: [name], MD  Assessment/Plan: Principal Problem:  *Pyelonephritis Active Problems:  DEMENTIA  HYPERTENSION  PROSTATE CANCER, HX OF  CEREBROVASCULAR ACCIDENT, HX OF  Chronic renal insufficiency, stage I  Aortic insufficiency  1. Recurrent UTI/pyelonephritis. The patient has received IV Rocephin in the ER. Continues to have subjective fevers, change to Zosyn last night. We'll draw blood cultures x2. CT stone protocol shows 9 mm nonobstructive stone. The patient does not have any gross hematuria. 2. Hypertension continue to hold some of his antihypertensive medications with the exception of hydralazine 3. Recent fall likely secondary to dehydration/orthostasis/weakness in the setting of his UTI/pyelonephritis. Will obtain PT OT consultation and the patient is more stable 4. History of CVA continue Plavix and aspirin 5. Dementia. Contribution to his intermittent falls and altered mental status. Continue to monitor 6. Persistent fever. Repeat chest x-ray concern for aspiration, speech therapy evaluation ordered 7. Acute kidney injury, creatinine of 1.6 upon presentation now down to 1.49, likely prerenal HPI/Subjective: Continues to have subjective fevers  Objective: Filed Vitals:   01/12/12 0500 01/12/12 0600 01/12/12 0620 01/12/12 0733  BP:  181/80 162/74 160/88  Pulse:  70    Temp: 99.5 F (37.5 C) 100.1 F (37.8 C)    TempSrc: Oral Oral    Resp:  20    Height:      Weight:  82.5 kg (181 lb 14.1 oz)    SpO2:  99%      Intake/Output Summary (Last 24 hours) at 01/12/12 0954 Last data filed at 01/12/12 0900  Gross per 24 hour  Intake    720 ml  Output      0 ml  Net    720 ml    Exam:  HENT:  Head: Atraumatic.  Nose: Nose normal.  Mouth/Throat: Oropharynx is clear and moist.  Eyes: Conjunctivae are normal. Pupils are equal, round, and reactive to light. No scleral  icterus.  Neck: Neck supple. No tracheal deviation present.  Cardiovascular: Normal rate, regular rhythm, normal heart sounds and intact distal pulses.  Pulmonary/Chest: Effort normal and breath sounds normal. No respiratory distress.  Abdominal: Soft. Normal appearance and bowel sounds are normal. She exhibits no distension. There is no tenderness.  Musculoskeletal: She exhibits no edema and no tenderness.  Neurological: She is alert. No cranial nerve deficit.    Data Reviewed: Basic Metabolic Panel:  Lab 01/12/12 9562 01/11/12 1152 01/11/12 0616 01/11/12 0545  NA 137 -- 139 134*  K 3.8 -- 3.7 3.7  CL 102 -- 101 96  CO2 25 -- -- 27  GLUCOSE 105* -- 112* 112*  BUN 11 -- 15 14  CREATININE 1.49* -- 1.60* 1.43*  CALCIUM 8.4 -- -- 9.5  MG -- 1.9 -- --  PHOS -- -- -- --    Liver Function Tests:  Lab 01/11/12 1152 01/11/12 0545  AST 20 21  ALT 15 16  ALKPHOS 79 84  BILITOT 0.4 0.5  PROT 7.2 8.1  ALBUMIN 3.3* 3.7   No results found for this basename: LIPASE:5,AMYLASE:5 in the last 168 hours No results found for this basename: AMMONIA:5 in the last 168 hours  CBC:  Lab 01/12/12 0450 01/11/12 1152 01/11/12 0616 01/11/12 0545  WBC 5.6 5.7 -- 8.4  NEUTROABS -- -- -- --  HGB 12.2* 13.1 15.3 14.5  HCT 35.7* 37.5* 45.0 41.2  MCV 81.7 81.5 -- 80.9  PLT 227 238 -- 249  Cardiac Enzymes:  Lab 01/11/12 2235 01/11/12 1609 01/11/12 0945  CKTOTAL -- -- --  CKMB -- -- --  CKMBINDEX -- -- --  TROPONINI <0.30 <0.30 <0.30   BNP (last 3 results) No results found for this basename: PROBNP:3 in the last 8760 hours   CBG: No results found for this basename: GLUCAP:5 in the last 168 hours  Recent Results (from the past 240 hour(s))  URINE CULTURE     Status: Normal   Collection Time   01/08/12 11:22 AM      Component Value Range Status Comment   Colony Count NO GROWTH   Final    Organism ID, Bacteria NO GROWTH   Final      Studies: Ct Abdomen Pelvis Wo Contrast  01/11/2012   *RADIOLOGY REPORT*  Clinical Data: Abdominal pain.  Fever.  CT ABDOMEN AND PELVIS WITHOUT CONTRAST  Technique:  Multidetector CT imaging of the abdomen and pelvis was performed following the standard protocol without intravenous contrast.  Comparison: CT of the chest abdomen and pelvis 12/09/2009.  Findings:  Lung Bases: Minimal dependent subsegmental atelectasis in the lower lobes of the lungs bilaterally.  Mild ectasia of the descending thoracic aorta (3.1 cm in diameter).  Borderline cardiomegaly.  Abdomen/Pelvis:  9 mm nonobstructive calculus in the lower pole collecting system of the left kidney.  No additional calculi within the collecting system of the right kidney, along the course of the right ureter, or within the lumen of the urinary bladder.  No hydroureteronephrosis or perinephric stranding to suggest urinary tract obstruction at this time.  1.8 cm low attenuation lesion in the upper pole of the left kidney is incompletely characterized on today's examination, but similar in size to prior study from 12/09/2009, and likely represent a cyst.  Status post cholecystectomy.  The unenhanced appearance of the liver, pancreas, spleen and bilateral adrenal glands is unremarkable.  Atherosclerosis of the abdominal and pelvic vasculature, including tortuosity and ectasia of the infrarenal abdominal aorta (up to 2.3 cm in diameter) and mild aneurysmal dilatation of the common iliac arteries bilaterally (16 mm on the left and 15 mm on the right).  Postoperative changes of TURP are noted, with a cystic area in the prostate and some dystrophic calcifications.  Urinary bladder is unremarkable in appearance. There are a few colonic diverticula, without surrounding inflammatory changes to suggest an acute diverticulitis at this time.  Normal appendix.  No ascites or pneumoperitoneum and no pathologic distension of bowel.  No definite pathologic lymphadenopathy identified within the abdomen or pelvis on this noncontrast  CT examination.  Numerous prominent but nonenlarged retroperitoneal lymph nodes are conspicuous in number rather than size (a highly nonspecific finding).  Musculoskeletal: There are no aggressive appearing lytic or blastic lesions noted in the visualized portions of the skeleton.  Mild S- shaped scoliosis of the lumbar spine, convex to the right superiorly and to the left inferiorly.  IMPRESSION: 1.  No definite acute abnormality noted within the abdomen or pelvis to account for the patient's symptoms. 2.  There is a 9 mm nonobstructive calculus in the lower pole collecting system of the left kidney. 3. Postoperative changes  of TURP with apparent cystic region in the prostate gland which has multiple dystrophic calcifications associated with it. 4.  Atherosclerosis, as above. 5.  Status post cholecystectomy. 6.  Mild colonic diverticulosis without findings to suggest acute diverticulitis at this time. 7.  Normal appendix. 8.  Additional incidental findings, as above.   Original Report Authenticated By:  Florencia Reasons, [name]    Dg Chest 2 View  01/11/2012  *RADIOLOGY REPORT*  Clinical Data: Fever.  UTI.  CHEST - 2 VIEW  Comparison: 12/07/2009.  Findings: Shallow inspiration with elevation of the left hemidiaphragm.  Heart size and pulmonary vascularity are normal for technique.  No focal airspace consolidation.  No blunting of costophrenic angles.  No pneumothorax.  Mediastinal contours appear intact.  Tortuous aorta.  Probable aortic ectasia.  Surgical clips in the right upper quadrant.  Probably no significant change since previous study, allowing for different inspiratory effort.  IMPRESSION: Tortuous and ectatic aorta.  Shallow inspiration.  No active pulmonary disease.   Original Report Authenticated By: Marlon Pel, [name]     Scheduled Meds:   . aspirin  81 mg Oral Daily  . clopidogrel  75 mg Oral Daily  . darifenacin  7.5 mg Oral Daily  . enoxaparin (LOVENOX) injection  40 mg Subcutaneous  Q24H  . hydrALAZINE  25 mg Oral Q8H  . levETIRAcetam  250 mg Oral BID  . piperacillin-tazobactam (ZOSYN)  IV  3.375 g Intravenous Q8H  . sodium chloride  3 mL Intravenous Q12H  . DISCONTD: cefTRIAXone (ROCEPHIN)  IV  1 g Intravenous Q24H   Continuous Infusions:   . sodium chloride 125 mL/hr at 01/12/12 0700    Principal Problem:  *Pyelonephritis Active Problems:  DEMENTIA  HYPERTENSION  PROSTATE CANCER, HX OF  CEREBROVASCULAR ACCIDENT, HX OF  Chronic renal insufficiency, stage I  Aortic insufficiency    Time spent: 40 minutes   [facility]  [facility] Pager [phone]. If 8PM-8AM, please contact night-coverage at [email or web address], password [facility] 01/12/2012, 9:54 AM  LOS: 1 day

## 2012-01-12 NOTE — Progress Notes (Signed)
Patient with temperature of 101.3.  Dr. Susie Cassette aware.  Ordered blood cultures x2 and vancomycin as per pharmacy.  Will give tylenol to patient for fever.  Will continue to monitor.

## 2012-01-12 NOTE — Progress Notes (Signed)
V-tach showing up on the monitor as per monitor technician.  Patient is asymptomatic, no chest pain no shortness of breath.  Patient is resting comfortably.  Heart rate no higher than 92.  Dr. Susie Cassette aware.  EKG completed, results read back to Dr. Susie Cassette.  Troponin to be drawn as well as magnessium.  Will continue to monitor.

## 2012-01-12 NOTE — Clinical Documentation Improvement (Signed)
CHANGE MENTAL STATUS DOCUMENTATION CLARIFICATION   THIS DOCUMENT IS NOT A PERMANENT PART OF THE MEDICAL RECORD  TO RESPOND TO THE THIS QUERY, FOLLOW THE INSTRUCTIONS BELOW:  1. If needed, update documentation for the patient's encounter via the notes activity.  2. Access this query again and click edit on the In [employer].  3. After updating, or not, click F2 to complete all highlighted (required) fields concerning your review. Select "additional documentation in the medical record" OR "no additional documentation provided".  4. Click Sign note button.  5. The deficiency will fall out of your In Basket *Please let us know if you are not able to complete this workflow by phone or e-mail (listed below).         01/12/12  Dear Dr. Susie Cassette Marton Redwood  In an effort to better capture your patient's severity of illness, reflect appropriate length of stay and utilization of resources, a review of the patient medical record has revealed the following indicators.    Based on your clinical judgment, please clarify and document in a progress note and/or discharge summary the clinical condition associated with the following supporting information:  In responding to this query please exercise your independent judgment.  The fact that a query is asked, does not imply that any particular answer is desired or expected.   Possible Clinical Conditions?  _______Encephalopathy (describe type if known)                       Anoxic                       Septic                       Alcoholic                        Hepatic                       Hypertensive                       Metabolic                       Toxic  _______Drug induced confusion/delirium _______Acute confusion _______Acute delirium _______Acute exacerbation of known dementia (indicate type) _______Other Condition _______Cannot Clinically Determine     Signs & Symptoms: Patient with AMS, intermittent confusion, history or  dementia noted per 9/8 progress notes.   Reviewed: No additional documentation provided.  Thank You,  Marciano Sequin,  Clinical Documentation Specialist:  Pager: [phone]  Health Information Management [place]

## 2012-01-13 LAB — URINE CULTURE

## 2012-01-13 LAB — TROPONIN I: Troponin I: <0.3 ng/mL (ref <0.30)

## 2012-01-13 NOTE — Progress Notes (Signed)
SLP Note  SLP did not see pt today but reviewed vitals and CXR. Based on pt continuing with fevers, wife previous report of pt coughing some with liquids, findings of CXR and pt h/o dementia/CVA would recommend to pursue MBS to allow instrumental evaluation of swallow.    MD please order if you agree.  Thanks.  SLP to follow up next date.    Donavan Burnet, MS [facility] SLP [phone]    Dg Chest 2 View  01/12/2012  *RADIOLOGY REPORT*  Clinical Data: Cough.  Fever.  History of prostate cancer.  CHEST - 2 VIEW  Comparison: Two-view chest x-ray yesterday and 10/26/2009.  Findings: Interval development of a focal opacity in the lingula and left lower lobe since the examination yesterday.  Lungs remain clear otherwise.  Cardiac silhouette mildly enlarged but stable. Thoracic aorta tortuous atherosclerotic, unchanged.  No pleural effusions.  Degenerative changes involving the thoracic spine. Lateral image less than optimal as patient was unable to raise the arms.  IMPRESSION: Developing atelectasis and/or pneumonia in the lingula and left lower lobe.   Original Report Authenticated By: Arnell Sieving, [name]

## 2012-01-13 NOTE — Progress Notes (Addendum)
[facility] PROGRESS NOTE  Mike Williams ZOX:[id number] DOB: 03-21-1933 DOA: 01/11/2012 PCP: [name], MD  Assessment/Plan: Principal Problem:  *Pyelonephritis Active Problems:  DEMENTIA  HYPERTENSION  PROSTATE CANCER, HX OF  CEREBROVASCULAR ACCIDENT, HX OF  Chronic renal insufficiency, stage I  Aortic insufficiency  1. Recurrent UTI/pyelonephritis. The patient has received IV Rocephin in the ER. Continues to have subjective fevers, chest x-ray shows developing pneumonia, switched to vancomycin and Zosyn. blood cultures x2 NGSF. CT stone protocol shows 9 mm nonobstructive stone. The patient does not have any gross hematuria. 2. Hypertension continue to hold some of his antihypertensive medications with the exception of hydralazine 3. Recent fall likely secondary to dehydration/orthostasis/weakness in the setting of his UTI/pyelonephritis. Will obtain PT OT consultation and the patient is more stable 4. History of CVA continue Plavix and aspirin 5. Dementia. Contribution to his intermittent falls and altered mental status. Continue to monitor 6. Persistent fever. Repeat chest x-ray concern for aspiration, speech therapy evaluation ordered 7. Acute kidney injury, creatinine of 1.6 upon presentation now down to 1.49, likely prerenal 8.  9.  HPI/Subjective:  Continues to have subjective fevers   Objective: Filed Vitals:   01/12/12 0733 01/12/12 1505 01/12/12 2200 01/13/12 0600  BP: 160/88 151/78 156/94 164/84  Pulse:  79 64 66  Temp:  101.3 F (38.5 C) 98.8 F (37.1 C) 98.5 F (36.9 C)  TempSrc:  Oral Oral Oral  Resp:  18 18 18   Height:      Weight:    80.4 kg (177 lb 4 oz)  SpO2:  96% 95% 95%    Intake/Output Summary (Last 24 hours) at 01/13/12 1141 Last data filed at 01/13/12 0949  Gross per 24 hour  Intake    540 ml  Output      0 ml  Net    540 ml    Exam:  HENT:  Head: Atraumatic.  Nose: Nose normal.  Mouth/Throat: Oropharynx is clear and moist.    Eyes: Conjunctivae are normal. Pupils are equal, round, and reactive to light. No scleral icterus.  Neck: Neck supple. No tracheal deviation present.  Cardiovascular: Normal rate, regular rhythm, normal heart sounds and intact distal pulses.  Pulmonary/Chest: Effort normal and breath sounds normal. No respiratory distress.  Abdominal: Soft. Normal appearance and bowel sounds are normal. She exhibits no distension. There is no tenderness.  Musculoskeletal: She exhibits no edema and no tenderness.  Neurological: She is alert. No cranial nerve deficit.    Data Reviewed: Basic Metabolic Panel:  Lab 01/12/12 8119 01/12/12 0450 01/11/12 1152 01/11/12 0616 01/11/12 0545  NA -- 137 -- 139 134*  K -- 3.8 -- 3.7 3.7  CL -- 102 -- 101 96  CO2 -- 25 -- -- 27  GLUCOSE -- 105* -- 112* 112*  BUN -- 11 -- 15 14  CREATININE -- 1.49* -- 1.60* 1.43*  CALCIUM -- 8.4 -- -- 9.5  MG 1.9 -- 1.9 -- --  PHOS -- -- -- -- --    Liver Function Tests:  Lab 01/11/12 1152 01/11/12 0545  AST 20 21  ALT 15 16  ALKPHOS 79 84  BILITOT 0.4 0.5  PROT 7.2 8.1  ALBUMIN 3.3* 3.7   No results found for this basename: LIPASE:5,AMYLASE:5 in the last 168 hours No results found for this basename: AMMONIA:5 in the last 168 hours  CBC:  Lab 01/12/12 0450 01/11/12 1152 01/11/12 0616 01/11/12 0545  WBC 5.6 5.7 -- 8.4  NEUTROABS -- -- -- --  HGB 12.2* 13.1 15.3 14.5  HCT 35.7* 37.5* 45.0 41.2  MCV 81.7 81.5 -- 80.9  PLT 227 238 -- 249    Cardiac Enzymes:  Lab 01/13/12 0455 01/12/12 2200 01/12/12 1643 01/11/12 2235 01/11/12 1609  CKTOTAL -- -- -- -- --  CKMB -- -- -- -- --  CKMBINDEX -- -- -- -- --  TROPONINI <0.30 <0.30 <0.30 <0.30 <0.30   BNP (last 3 results) No results found for this basename: PROBNP:3 in the last 8760 hours   CBG: No results found for this basename: GLUCAP:5 in the last 168 hours  Recent Results (from the past 240 hour(s))  URINE CULTURE     Status: Normal   Collection Time    01/08/12 11:22 AM      Component Value Range Status Comment   Colony Count NO GROWTH   Final    Organism ID, Bacteria NO GROWTH   Final   CULTURE, BLOOD (ROUTINE X 2)     Status: Normal (Preliminary result)   Collection Time   01/11/12  9:45 AM      Component Value Range Status Comment   Specimen Description BLOOD RIGHT ANTECUBITAL   Final    Special Requests BOTTLES DRAWN AEROBIC AND ANAEROBIC 4CC   Final    Culture  Setup Time 01/11/2012 17:10   Final    Culture     Final    Value:        BLOOD CULTURE RECEIVED NO GROWTH TO DATE CULTURE WILL BE HELD FOR 5 DAYS BEFORE ISSUING A FINAL NEGATIVE REPORT   Report Status PENDING   Incomplete   CULTURE, BLOOD (ROUTINE X 2)     Status: Normal (Preliminary result)   Collection Time   01/11/12  9:56 AM      Component Value Range Status Comment   Specimen Description BLOOD LEFT HAND   Final    Special Requests BOTTLES DRAWN AEROBIC ONLY 1CC   Final    Culture  Setup Time 01/11/2012 17:10   Final    Culture     Final    Value:        BLOOD CULTURE RECEIVED NO GROWTH TO DATE CULTURE WILL BE HELD FOR 5 DAYS BEFORE ISSUING A FINAL NEGATIVE REPORT   Report Status PENDING   Incomplete   URINE CULTURE     Status: Normal   Collection Time   01/11/12  5:59 PM      Component Value Range Status Comment   Specimen Description URINE, CATHETERIZED   Final    Special Requests NONE   Final    Culture  Setup Time 01/12/2012 09:32   Final    Colony Count NO GROWTH   Final    Culture NO GROWTH   Final    Report Status 01/13/2012 FINAL   Final      Studies: Ct Abdomen Pelvis Wo Contrast  01/11/2012  *RADIOLOGY REPORT*  Clinical Data: Abdominal pain.  Fever.  CT ABDOMEN AND PELVIS WITHOUT CONTRAST  Technique:  Multidetector CT imaging of the abdomen and pelvis was performed following the standard protocol without intravenous contrast.  Comparison: CT of the chest abdomen and pelvis 12/09/2009.  Findings:  Lung Bases: Minimal dependent subsegmental atelectasis in the  lower lobes of the lungs bilaterally.  Mild ectasia of the descending thoracic aorta (3.1 cm in diameter).  Borderline cardiomegaly.  Abdomen/Pelvis:  9 mm nonobstructive calculus in the lower pole collecting system of the left kidney.  No additional calculi within the  collecting system of the right kidney, along the course of the right ureter, or within the lumen of the urinary bladder.  No hydroureteronephrosis or perinephric stranding to suggest urinary tract obstruction at this time.  1.8 cm low attenuation lesion in the upper pole of the left kidney is incompletely characterized on today's examination, but similar in size to prior study from 12/09/2009, and likely represent a cyst.  Status post cholecystectomy.  The unenhanced appearance of the liver, pancreas, spleen and bilateral adrenal glands is unremarkable.  Atherosclerosis of the abdominal and pelvic vasculature, including tortuosity and ectasia of the infrarenal abdominal aorta (up to 2.3 cm in diameter) and mild aneurysmal dilatation of the common iliac arteries bilaterally (16 mm on the left and 15 mm on the right).  Postoperative changes of TURP are noted, with a cystic area in the prostate and some dystrophic calcifications.  Urinary bladder is unremarkable in appearance. There are a few colonic diverticula, without surrounding inflammatory changes to suggest an acute diverticulitis at this time.  Normal appendix.  No ascites or pneumoperitoneum and no pathologic distension of bowel.  No definite pathologic lymphadenopathy identified within the abdomen or pelvis on this noncontrast CT examination.  Numerous prominent but nonenlarged retroperitoneal lymph nodes are conspicuous in number rather than size (a highly nonspecific finding).  Musculoskeletal: There are no aggressive appearing lytic or blastic lesions noted in the visualized portions of the skeleton.  Mild S- shaped scoliosis of the lumbar spine, convex to the right superiorly and to the  left inferiorly.  IMPRESSION: 1.  No definite acute abnormality noted within the abdomen or pelvis to account for the patient's symptoms. 2.  There is a 9 mm nonobstructive calculus in the lower pole collecting system of the left kidney. 3. Postoperative changes  of TURP with apparent cystic region in the prostate gland which has multiple dystrophic calcifications associated with it. 4.  Atherosclerosis, as above. 5.  Status post cholecystectomy. 6.  Mild colonic diverticulosis without findings to suggest acute diverticulitis at this time. 7.  Normal appendix. 8.  Additional incidental findings, as above.   Original Report Authenticated By: Florencia Reasons, [name]    Dg Chest 2 View  01/12/2012  *RADIOLOGY REPORT*  Clinical Data: Cough.  Fever.  History of prostate cancer.  CHEST - 2 VIEW  Comparison: Two-view chest x-ray yesterday and 10/26/2009.  Findings: Interval development of a focal opacity in the lingula and left lower lobe since the examination yesterday.  Lungs remain clear otherwise.  Cardiac silhouette mildly enlarged but stable. Thoracic aorta tortuous atherosclerotic, unchanged.  No pleural effusions.  Degenerative changes involving the thoracic spine. Lateral image less than optimal as patient was unable to raise the arms.  IMPRESSION: Developing atelectasis and/or pneumonia in the lingula and left lower lobe.   Original Report Authenticated By: Arnell Sieving, [name]    Dg Chest 2 View  01/11/2012  *RADIOLOGY REPORT*  Clinical Data: Fever.  UTI.  CHEST - 2 VIEW  Comparison: 12/07/2009.  Findings: Shallow inspiration with elevation of the left hemidiaphragm.  Heart size and pulmonary vascularity are normal for technique.  No focal airspace consolidation.  No blunting of costophrenic angles.  No pneumothorax.  Mediastinal contours appear intact.  Tortuous aorta.  Probable aortic ectasia.  Surgical clips in the right upper quadrant.  Probably no significant change since previous study, allowing  for different inspiratory effort.  IMPRESSION: Tortuous and ectatic aorta.  Shallow inspiration.  No active pulmonary disease.   Original Report  Authenticated By: Marlon Pel, [name]     Scheduled Meds:   . aspirin  81 mg Oral Daily  . clopidogrel  75 mg Oral Daily  . darifenacin  7.5 mg Oral Daily  . enoxaparin (LOVENOX) injection  40 mg Subcutaneous Q24H  . hydrALAZINE  25 mg Oral Q8H  . levETIRAcetam  250 mg Oral BID  . piperacillin-tazobactam (ZOSYN)  IV  3.375 g Intravenous Q8H  . sodium chloride  3 mL Intravenous Q12H  . vancomycin  750 mg Intravenous Q12H   Continuous Infusions:   . sodium chloride 125 mL/hr at 01/12/12 0700    Principal Problem:  *Pyelonephritis Active Problems:  DEMENTIA  HYPERTENSION  PROSTATE CANCER, HX OF  CEREBROVASCULAR ACCIDENT, HX OF  Chronic renal insufficiency, stage I  Aortic insufficiency    Time spent: 40 minutes   [facility]  [facility] Pager [phone]. If 8PM-8AM, please contact night-coverage at [email or web address], password [facility] 01/13/2012, 11:41 AM  LOS: 2 days

## 2012-01-14 ENCOUNTER — Inpatient Hospital Stay (HOSPITAL_COMMUNITY): Payer: Medicare Other

## 2012-01-14 LAB — BASIC METABOLIC PANEL
Chloride: 104 mEq/L (ref 96–112)
Glucose, Bld: 104 mg/dL — ABNORMAL HIGH (ref 70–99)
Potassium: 3.7 mEq/L (ref 3.5–5.1)
Sodium: 138 mEq/L (ref 135–145)

## 2012-01-14 MED ORDER — INFLUENZA VIRUS VACC SPLIT PF IM SUSP
0.5000 mL | INTRAMUSCULAR | Status: AC
  Administered 2012-01-15: 0.5 mL via INTRAMUSCULAR
  Filled 2012-01-14: qty 0.5

## 2012-01-14 NOTE — Progress Notes (Signed)
Order received for MBS, thank you.  SLP informed pt and spouse purpose for testing and procedure.  Spouse reports pt has not been "choking" but had demonstrated subtle cough.  Both agreeable to participate in MBS scheduled at 11 am today per radiology.  Thanks.    Donavan Burnet, MS [facility] SLP [phone]

## 2012-01-14 NOTE — Progress Notes (Addendum)
[facility] PROGRESS NOTE  Mike Williams RUE:[id number] DOB: 11-13-1932 DOA: 01/11/2012 PCP: [name], MD  Assessment/Plan: Principal Problem:  *Pyelonephritis Active Problems:  DEMENTIA  HYPERTENSION  PROSTATE CANCER, HX OF  CEREBROVASCULAR ACCIDENT, HX OF  Chronic renal insufficiency, stage I  Aortic insufficiency  1. Recurrent UTI/pyelonephritis. The patient has received IV Rocephin in the ER. Continues to have subjective fevers, chest x-ray shows developing pneumonia, switched to vancomycin and Zosyn. blood cultures x2 NGSF. CT stone protocol shows 9 mm nonobstructive stone. The patient does not have any gross hematuria. 2. Hypertension continue to hold some of his antihypertensive medications with the exception of hydralazine 3. Recent fall likely secondary to dehydration/orthostasis/weakness in the setting of his UTI/pyelonephritis. Will obtain PT OT consultation and the patient is more stable 4. History of CVA continue Plavix and aspirin 5. Dementia. Contribution to his intermittent falls and altered mental status. Continue to monitor 6. Persistent fever. Repeat chest x-ray concern for aspiration, speech therapy evaluation ordered 7. Acute kidney injury, creatinine of 1.6 upon presentation now down to 1.22, likely prerenal 8. Disposition; not stable for DC at this time, most likely in 1-2 days  HPI/Subjective:  Continues to have subjective fevers      Objective: Filed Vitals:   01/13/12 0600 01/13/12 1459 01/13/12 2053 01/14/12 0518  BP: 164/84 143/79 150/78 153/77  Pulse: 66 75 61 63  Temp: 98.5 F (36.9 C) 98.5 F (36.9 C) 98.9 F (37.2 C) 97.9 F (36.6 C)  TempSrc: Oral Oral Oral Oral  Resp: 18 20 19 18   Height:      Weight: 80.4 kg (177 lb 4 oz)   80 kg (176 lb 5.9 oz)  SpO2: 95% 97% 98% 98%    Intake/Output Summary (Last 24 hours) at 01/14/12 1323 Last data filed at 01/14/12 0521  Gross per 24 hour  Intake   1120 ml  Output    300 ml  Net     820 ml    Exam:  HENT:  Head: Atraumatic.  Nose: Nose normal.  Mouth/Throat: Oropharynx is clear and moist.  Eyes: Conjunctivae are normal. Pupils are equal, round, and reactive to light. No scleral icterus.  Neck: Neck supple. No tracheal deviation present.  Cardiovascular: Normal rate, regular rhythm, normal heart sounds and intact distal pulses.  Pulmonary/Chest: Effort normal and breath sounds normal. No respiratory distress.  Abdominal: Soft. Normal appearance and bowel sounds are normal. She exhibits no distension. There is no tenderness.  Musculoskeletal: She exhibits no edema and no tenderness.  Neurological: Minimally arousable No cranial nerve deficit.    Data Reviewed: Basic Metabolic Panel:  Lab 01/14/12 1478 01/12/12 1730 01/12/12 0450 01/11/12 1152 01/11/12 0616 01/11/12 0545  NA 138 -- 137 -- 139 134*  K 3.7 -- 3.8 -- 3.7 3.7  CL 104 -- 102 -- 101 96  CO2 26 -- 25 -- -- 27  GLUCOSE 104* -- 105* -- 112* 112*  BUN 10 -- 11 -- 15 14  CREATININE 1.22 -- 1.49* -- 1.60* 1.43*  CALCIUM 8.1* -- 8.4 -- -- 9.5  MG -- 1.9 -- 1.9 -- --  PHOS -- -- -- -- -- --    Liver Function Tests:  Lab 01/11/12 1152 01/11/12 0545  AST 20 21  ALT 15 16  ALKPHOS 79 84  BILITOT 0.4 0.5  PROT 7.2 8.1  ALBUMIN 3.3* 3.7   No results found for this basename: LIPASE:5,AMYLASE:5 in the last 168 hours No results found for this basename: AMMONIA:5 in  the last 168 hours  CBC:  Lab 01/12/12 0450 01/11/12 1152 01/11/12 0616 01/11/12 0545  WBC 5.6 5.7 -- 8.4  NEUTROABS -- -- -- --  HGB 12.2* 13.1 15.3 14.5  HCT 35.7* 37.5* 45.0 41.2  MCV 81.7 81.5 -- 80.9  PLT 227 238 -- 249    Cardiac Enzymes:  Lab 01/13/12 0455 01/12/12 2200 01/12/12 1643 01/11/12 2235 01/11/12 1609  CKTOTAL -- -- -- -- --  CKMB -- -- -- -- --  CKMBINDEX -- -- -- -- --  TROPONINI <0.30 <0.30 <0.30 <0.30 <0.30   BNP (last 3 results) No results found for this basename: PROBNP:3 in the last 8760  hours   CBG: No results found for this basename: GLUCAP:5 in the last 168 hours  Recent Results (from the past 240 hour(s))  URINE CULTURE     Status: Normal   Collection Time   01/08/12 11:22 AM      Component Value Range Status Comment   Colony Count NO GROWTH   Final    Organism ID, Bacteria NO GROWTH   Final   CULTURE, BLOOD (ROUTINE X 2)     Status: Normal (Preliminary result)   Collection Time   01/11/12  9:45 AM      Component Value Range Status Comment   Specimen Description BLOOD RIGHT ANTECUBITAL   Final    Special Requests BOTTLES DRAWN AEROBIC AND ANAEROBIC 4CC   Final    Culture  Setup Time 01/11/2012 17:10   Final    Culture     Final    Value:        BLOOD CULTURE RECEIVED NO GROWTH TO DATE CULTURE WILL BE HELD FOR 5 DAYS BEFORE ISSUING A FINAL NEGATIVE REPORT   Report Status PENDING   Incomplete   CULTURE, BLOOD (ROUTINE X 2)     Status: Normal (Preliminary result)   Collection Time   01/11/12  9:56 AM      Component Value Range Status Comment   Specimen Description BLOOD LEFT HAND   Final    Special Requests BOTTLES DRAWN AEROBIC ONLY 1CC   Final    Culture  Setup Time 01/11/2012 17:10   Final    Culture     Final    Value:        BLOOD CULTURE RECEIVED NO GROWTH TO DATE CULTURE WILL BE HELD FOR 5 DAYS BEFORE ISSUING A FINAL NEGATIVE REPORT   Report Status PENDING   Incomplete   URINE CULTURE     Status: Normal   Collection Time   01/11/12  5:59 PM      Component Value Range Status Comment   Specimen Description URINE, CATHETERIZED   Final    Special Requests NONE   Final    Culture  Setup Time 01/12/2012 09:32   Final    Colony Count NO GROWTH   Final    Culture NO GROWTH   Final    Report Status 01/13/2012 FINAL   Final   CULTURE, BLOOD (ROUTINE X 2)     Status: Normal (Preliminary result)   Collection Time   01/12/12  4:10 PM      Component Value Range Status Comment   Specimen Description BLOOD RIGHT ARM   Final    Special Requests BOTTLES DRAWN AEROBIC AND  ANAEROBIC 3.5CC   Final    Culture  Setup Time 01/13/2012 01:47   Final    Culture     Final    Value:  BLOOD CULTURE RECEIVED NO GROWTH TO DATE CULTURE WILL BE HELD FOR 5 DAYS BEFORE ISSUING A FINAL NEGATIVE REPORT   Report Status PENDING   Incomplete   CULTURE, BLOOD (ROUTINE X 2)     Status: Normal (Preliminary result)   Collection Time   01/12/12  4:47 PM      Component Value Range Status Comment   Specimen Description BLOOD RIGHT ARM   Final    Special Requests BOTTLES DRAWN AEROBIC ONLY 2CC   Final    Culture  Setup Time 01/13/2012 01:47   Final    Culture     Final    Value:        BLOOD CULTURE RECEIVED NO GROWTH TO DATE CULTURE WILL BE HELD FOR 5 DAYS BEFORE ISSUING A FINAL NEGATIVE REPORT   Report Status PENDING   Incomplete      Studies: Ct Abdomen Pelvis Wo Contrast  01/11/2012  *RADIOLOGY REPORT*  Clinical Data: Abdominal pain.  Fever.  CT ABDOMEN AND PELVIS WITHOUT CONTRAST  Technique:  Multidetector CT imaging of the abdomen and pelvis was performed following the standard protocol without intravenous contrast.  Comparison: CT of the chest abdomen and pelvis 12/09/2009.  Findings:  Lung Bases: Minimal dependent subsegmental atelectasis in the lower lobes of the lungs bilaterally.  Mild ectasia of the descending thoracic aorta (3.1 cm in diameter).  Borderline cardiomegaly.  Abdomen/Pelvis:  9 mm nonobstructive calculus in the lower pole collecting system of the left kidney.  No additional calculi within the collecting system of the right kidney, along the course of the right ureter, or within the lumen of the urinary bladder.  No hydroureteronephrosis or perinephric stranding to suggest urinary tract obstruction at this time.  1.8 cm low attenuation lesion in the upper pole of the left kidney is incompletely characterized on today's examination, but similar in size to prior study from 12/09/2009, and likely represent a cyst.  Status post cholecystectomy.  The unenhanced appearance  of the liver, pancreas, spleen and bilateral adrenal glands is unremarkable.  Atherosclerosis of the abdominal and pelvic vasculature, including tortuosity and ectasia of the infrarenal abdominal aorta (up to 2.3 cm in diameter) and mild aneurysmal dilatation of the common iliac arteries bilaterally (16 mm on the left and 15 mm on the right).  Postoperative changes of TURP are noted, with a cystic area in the prostate and some dystrophic calcifications.  Urinary bladder is unremarkable in appearance. There are a few colonic diverticula, without surrounding inflammatory changes to suggest an acute diverticulitis at this time.  Normal appendix.  No ascites or pneumoperitoneum and no pathologic distension of bowel.  No definite pathologic lymphadenopathy identified within the abdomen or pelvis on this noncontrast CT examination.  Numerous prominent but nonenlarged retroperitoneal lymph nodes are conspicuous in number rather than size (a highly nonspecific finding).  Musculoskeletal: There are no aggressive appearing lytic or blastic lesions noted in the visualized portions of the skeleton.  Mild S- shaped scoliosis of the lumbar spine, convex to the right superiorly and to the left inferiorly.  IMPRESSION: 1.  No definite acute abnormality noted within the abdomen or pelvis to account for the patient's symptoms. 2.  There is a 9 mm nonobstructive calculus in the lower pole collecting system of the left kidney. 3. Postoperative changes  of TURP with apparent cystic region in the prostate gland which has multiple dystrophic calcifications associated with it. 4.  Atherosclerosis, as above. 5.  Status post cholecystectomy. 6.  Mild colonic diverticulosis  without findings to suggest acute diverticulitis at this time. 7.  Normal appendix. 8.  Additional incidental findings, as above.   Original Report Authenticated By: Florencia Reasons, [name]    Dg Chest 2 View  01/12/2012  *RADIOLOGY REPORT*  Clinical Data: Cough.   Fever.  History of prostate cancer.  CHEST - 2 VIEW  Comparison: Two-view chest x-ray yesterday and 10/26/2009.  Findings: Interval development of a focal opacity in the lingula and left lower lobe since the examination yesterday.  Lungs remain clear otherwise.  Cardiac silhouette mildly enlarged but stable. Thoracic aorta tortuous atherosclerotic, unchanged.  No pleural effusions.  Degenerative changes involving the thoracic spine. Lateral image less than optimal as patient was unable to raise the arms.  IMPRESSION: Developing atelectasis and/or pneumonia in the lingula and left lower lobe.   Original Report Authenticated By: Arnell Sieving, [name]    Dg Chest 2 View  01/11/2012  *RADIOLOGY REPORT*  Clinical Data: Fever.  UTI.  CHEST - 2 VIEW  Comparison: 12/07/2009.  Findings: Shallow inspiration with elevation of the left hemidiaphragm.  Heart size and pulmonary vascularity are normal for technique.  No focal airspace consolidation.  No blunting of costophrenic angles.  No pneumothorax.  Mediastinal contours appear intact.  Tortuous aorta.  Probable aortic ectasia.  Surgical clips in the right upper quadrant.  Probably no significant change since previous study, allowing for different inspiratory effort.  IMPRESSION: Tortuous and ectatic aorta.  Shallow inspiration.  No active pulmonary disease.   Original Report Authenticated By: Marlon Pel, [name]     Scheduled Meds:   . aspirin  81 mg Oral Daily  . clopidogrel  75 mg Oral Daily  . darifenacin  7.5 mg Oral Daily  . enoxaparin (LOVENOX) injection  40 mg Subcutaneous Q24H  . hydrALAZINE  25 mg Oral Q8H  . levETIRAcetam  250 mg Oral BID  . piperacillin-tazobactam (ZOSYN)  IV  3.375 g Intravenous Q8H  . sodium chloride  3 mL Intravenous Q12H  . vancomycin  750 mg Intravenous Q12H   Continuous Infusions:   . sodium chloride 125 mL/hr at 01/14/12 0544    Principal Problem:  *Pyelonephritis Active Problems:  DEMENTIA  HYPERTENSION   PROSTATE CANCER, HX OF  CEREBROVASCULAR ACCIDENT, HX OF  Chronic renal insufficiency, stage I  Aortic insufficiency    Time spent: 40 minutes   [facility]  [facility] Pager [phone]. If 8PM-8AM, please contact night-coverage at [email or web address], password [facility] 01/14/2012, 1:23 PM  LOS: 3 days

## 2012-01-14 NOTE — Evaluation (Signed)
Occupational Therapy Evaluation Patient Details Name: Mike Williams MRN: [id number] DOB: 22-Dec-1932 Today's Date: 01/14/2012 Time: 0865-7846 OT Time Calculation (min): 19 min  OT Assessment / Plan / Recommendation Clinical Impression  Pt is a 78 yo male admitted with pylelonephritis and fall. Wife states she is no longer able to care for pt at home. He requires total A with bathing & dressing 2* declining cognititve status.. Skilled OT indicated to maximize independence with grooming, toileting to min A level in prep for d/c to next venue of care.    OT Assessment  Patient needs continued OT Services    Follow Up Recommendations  Skilled nursing facility    Barriers to Discharge Decreased caregiver support    Equipment Recommendations  None recommended by OT    Recommendations for Other Services    Frequency  Min 1X/week    Precautions / Restrictions Precautions Precautions: Fall Restrictions Weight Bearing Restrictions: No   Pertinent Vitals/Pain No c/o pain    ADL  Grooming: Simulated;Minimal assistance Where Assessed - Grooming: Unsupported sitting Toilet Transfer: Performed;Moderate assistance Toilet Transfer Method: Stand pivot Toilet Transfer Equipment: Other (comment) (recliner.) Toileting - Clothing Manipulation and Hygiene: Simulated;Maximal assistance Where Assessed - Toileting Clothing Manipulation and Hygiene: Sit to stand from 3-in-1 or toilet Equipment Used: Rolling walker;Gait belt Transfers/Ambulation Related to ADLs: Pt ambulated in the bathroom with minguard A and RW. Wife states pt will not use at home. Motorically delayed. Pt had extreme difficulty turning and changing directions. Max multimodal cues needed for safety.    OT Diagnosis: Generalized weakness  OT Problem List: Decreased safety awareness;Decreased activity tolerance;Decreased cognition;Decreased knowledge of use of DME or AE OT Treatment Interventions: Self-care/ADL training;Therapeutic  activities;DME and/or AE instruction;Patient/family education   OT Goals Acute Rehab OT Goals OT Goal Formulation: With family Time For Goal Achievement: 01/28/12 Potential to Achieve Goals: Fair ADL Goals Pt Will Perform Grooming: with supervision;Standing at sink ADL Goal: Grooming - Progress: Goal set today Pt Will Transfer to Toilet: with supervision;Ambulation;Regular height toilet;3-in-1;Stand pivot transfer ADL Goal: Toilet Transfer - Progress: Goal set today Pt Will Perform Toileting - Clothing Manipulation: with min assist;Sitting on 3-in-1 or toilet;Standing ADL Goal: Toileting - Clothing Manipulation - Progress: Goal set today Pt Will Perform Toileting - Hygiene: with min assist;Sit to stand from 3-in-1/toilet ADL Goal: Toileting - Hygiene - Progress: Goal set today Miscellaneous OT Goals Miscellaneous OT Goal #1: Pt will complete all selfcare tasks with a min amt of vcs for initiiation and followthrough. OT Goal: Miscellaneous Goal #1 - Progress: Goal set today  Visit Information  Last OT Received On: 01/14/12 Assistance Needed: +1 PT/OT Co-Evaluation/Treatment: Yes    Subjective Data  Subjective: Pt very flat and made minimal verbalizations throughout session. Patient Stated Goal: Per wife- to d/c to snf.   Prior Functioning  Vision/Perception  Home Living Lives With: Spouse Available Help at Discharge: Family Type of Home: House Home Access: Stairs to enter Secretary/administrator of Steps: 3 Entrance Stairs-Rails: Right;Left;Can reach both Home Layout: One level Bathroom Shower/Tub: Health visitor: Standard Home Adaptive Equipment: Shower chair with back;Bedside commode/3-in-1;Walker - rolling;Grab bars in shower Prior Function Level of Independence: Needs assistance Needs Assistance: Bathing;Dressing;Meal Prep;Light Housekeeping Bath: Total Dressing: Total Meal Prep: Total Light Housekeeping: Total Able to Take Stairs?: Yes Driving:  No Vocation: Retired Musician: No difficulties Dominant Hand: Right      Cognition  Overall Cognitive Status: History of cognitive impairments - further impaired Area of Impairment: Following commands;Awareness of  errors Arousal/Alertness: Awake/alert Orientation Level: Disoriented to;Time;Situation Behavior During Session: [facility] for tasks performed Following Commands: Follows one step commands inconsistently Awareness of Errors: Assistance required to identify errors made;Assistance required to correct errors made Cognition - Other Comments: Pts wife states that he is somewhat more confused since being in hospital     Extremity/Trunk Assessment Right Upper Extremity Assessment RUE ROM/Strength/Tone: [facility] for tasks assessed Left Upper Extremity Assessment LUE ROM/Strength/Tone: [facility] for tasks assessed Right Lower Extremity Assessment RLE ROM/Strength/Tone: [facility] for tasks assessed Left Lower Extremity Assessment LLE ROM/Strength/Tone: [facility] for tasks assessed Trunk Assessment Trunk Assessment: Normal   Mobility  Shoulder Instructions  Bed Mobility Bed Mobility: Supine to Sit;Sit to Supine Supine to Sit: 4: Min assist;HOB elevated;With rails Sitting - Scoot to Edge of Bed: 4: Min assist Details for Bed Mobility Assistance: Assist to get hips in correct position on EOB with cues for hand placement and technique.  Transfers Sit to Stand: 4: Min assist;From elevated surface;With upper extremity assist;From bed Stand to Sit: 4: Min assist;With upper extremity assist;With armrests;To chair/3-in-1 Details for Transfer Assistance: Assist to rise and stabalize with cues for hand placement and safety when sitting/standing.         Exercise     Balance     End of Session OT - End of Session Equipment Utilized During Treatment: Gait belt Activity Tolerance: Patient tolerated treatment well Patient left: in chair;with call bell/phone within reach;with bed alarm set;with  family/visitor present  GO     Christ Fullenwider A OTR/L [phone] 01/14/2012, 11:30 AM

## 2012-01-14 NOTE — Procedures (Addendum)
Objective Swallowing Evaluation:    Patient Details  Name: Laquinn Veasey MRN: [id number] Date of Birth: 03/03/1933  Today's Date: 01/14/2012 Time: 6213-0865 SLP Time Calculation (min): 36 min  Past Medical History:  Past Medical History  Diagnosis Date  . CEREBROVASCULAR ACCIDENT, HX OF 02/09/2007  . DEMENTIA 05/12/2007  . HYPERGLYCEMIA 09/21/2009  . HYPERTENSION 12/29/2006  . OBSTRUCTIVE SLEEP APNEA 12/29/2006  . PROSTATE CANCER, HX OF 12/29/2006  . Hemorrhoids   . Lactose intolerance   . Risk for falls   . Prostatitis   . Aortic root dilatation     Noted on 09/2010 echo, with mild-mod AI   Past Surgical History:  Past Surgical History  Procedure Date  . Cholecystectomy   . Prostate surgery     cryotherapy  . Ganglion cyst excision    HPI:  78yo male adm to [facility] after fall at home, found to have fever and UTI.  PMH + CVA, dementia, prostate cancer.  CXR negative for focal infiltrate.  Pt underwent BSE on 01/12/12 but had developed low grade fever and recent CXR indicated possible left infiltrate, therefore MBS was ordered.      Assessment / Plan / Recommendation Clinical Impression  Dysphagia Diagnosis: Moderate oral phase dysphagia;Mild pharyngeal phase dysphagia;Mild cervical esophageal phase dysphagia  Clinical impression: Moderate oral, mild pharyngeal and mild cervical esophageal dysphagia without aspiration or penetration observed.  Pt did not cough during eval which makes it difficult to coorelate cough to aspiration.  Viscous saliva noted in oral cavity after testing (without awareness) requiring verbal cues to expectorate.  Oral transit delays noted with oral resdiuals of liquids that mix with secretions and spill into pharynx.  Pharyngeal swallow was strong without residuals of pudding,cracker.  Barium tablet lodged at pyriform sinus - likely due to decr intraoral pressure/oral coordination- but cleared with further liquid consumption.    Suspect possible esophageal component  to pt's dysphagia, as pill lodged at distal esophagus clearing with liquids - appearance of minimal distal backflow of liquids noted - radiologist not present to confirm.  Advise pt to take pills with puree-follow with liquids for maximal airway protection.    Self feeding appeared effortful for this pt and he required assistance to lift cup high enough for liquid.  Wife present for eval and both educated to findings and recommendations.  Pt may intermittently aspirate due to premature loss of liquids into pharynx but was not observed during testing.      Treatment Recommendation       Diet Recommendation Regular;Thin liquid   Liquid Administration via: Cup;Straw Medication Administration: Whole meds with puree (follow with liquid) Compensations: Slow rate;Small sips/bites (pt to expectorate saliva after meals - ) Postural Changes and/or Swallow Maneuvers: Seated upright 90 degrees;Upright 30-60 min after meal    Other  Recommendations Oral Care Recommendations: Oral care before and after PO   Follow Up Recommendations       Frequency and Duration min 1 x/week  1 week   Pertinent Vitals/Pain Low grade temp, lungs clear    SLP Swallow Goals Swallow Study Goal #2 - Progress: Progressing toward goal   General Date of Onset: 01/12/12 HPI: 78yo male adm to [facility] after fall at home, found to have fever and UTI.  PMH + CVA, dementia, prostate cancer.  CXR negative for focal infiltrate.  Pt underwent BSE on 01/12/12 but had developed low grade fever and recent CXR indicated possible left infiltrate, therefore MBS was ordered.  Reason for Referral: Objectively evaluate swallowing  function;Other (comment) (to rule out overt aspiration) Previous Swallow Assessment: none Respiratory Status: Room air Behavior/Cognition: Alert;Requires cueing;Decreased sustained attention;Cooperative (pt with dementia) Oral Cavity - Dentition: Adequate natural dentition Baseline Vocal Quality: Clear Volitional  Cough: Strong Volitional Swallow: Able to elicit    Reason for Referral Objectively evaluate swallowing function;Other (comment) (to rule out overt aspiration)   Oral Phase Oral Preparation/Oral Phase Oral Phase: Impaired Oral - Thin Oral - Thin Teaspoon: Delayed oral transit Oral - Thin Cup: Delayed oral transit Oral - Thin Straw: Delayed oral transit Oral - Solids Oral - Puree: Delayed oral transit Oral - Regular: Delayed oral transit;Impaired mastication Oral - Pill: Delayed oral transit Oral Phase - Comment Oral Phase - Comment: delayed oral transiting- oral cavity full of secretions at completion of MBS- instructed pt to spit out- trace oral residuals of thin prematurely spill into pharynx mixed with secretions   Pharyngeal Phase Pharyngeal Phase Pharyngeal Phase: Impaired Pharyngeal - Thin Pharyngeal - Thin Straw: Premature spillage to pyriform sinuses;Delayed swallow initiation Pharyngeal - Solids Pharyngeal - Regular: Delayed swallow initiation;Premature spillage to valleculae Pharyngeal - Pill: Pharyngeal residue - pyriform sinuses (pill lodged at pyriform sinus - needed liquids to clear)  Cervical Esophageal Phase    GO    Cervical Esophageal Phase Cervical Esophageal Phase: Impaired Cervical Esophageal Phase - Comment Cervical Esophageal Comment: barium tablet lodged at pyriform sinus - clearing into esophagus with further sips of liquids - next tablet appeared to lodge at distal esophagus without pt awareness, clearing with further liquids but with delayed clearance and appearance of mild backflow of liquids         Donavan Burnet, MS [facility] SLP [phone]

## 2012-01-14 NOTE — Progress Notes (Signed)
Clinical Social Work Department BRIEF PSYCHOSOCIAL ASSESSMENT 01/14/2012  Patient:  TEVIN, SHILLINGFORD     Account Number:  [id number]     Admit date:  01/11/2012  Clinical Social Worker:  Doroteo Glassman  Date/Time:  01/14/2012 02:25 PM  Referred by:  Physician  Date Referred:  01/14/2012 Referred for  SNF Placement   Other Referral:   Interview type:  Patient Other interview type:   Wife    PSYCHOSOCIAL DATA Living Status:  WIFE Admitted from facility:   Level of care:   Primary support name:  Mrs. Fiumara Primary support relationship to patient:  SPOUSE Degree of support available:   Strong    CURRENT CONCERNS Current Concerns  Post-Acute Placement   Other Concerns:    SOCIAL WORK ASSESSMENT / PLAN Met with Pt and wife to discuss d/c plans.    Pt and wife understand MD and PT's recommendation with re: to SNF and Pt is amenable.    CSW discussed SNF process and answered questions.    Pt and wife gave CSW permission to fax Pt's information to [employer].    CSW thanked Pt and his wife for their time.   Assessment/plan status:   Other assessment/ plan:   Information/referral to community resources:   [facility] SNF list    PATIENT'S/FAMILY'S RESPONSE TO PLAN OF CARE: Family thanked CSW for time and assistance.  Providence Crosby, LCSWA Clinical Social Work [phone]

## 2012-01-14 NOTE — Progress Notes (Signed)
Clinical Social Work Department CLINICAL SOCIAL WORK PLACEMENT NOTE 01/14/2012  Patient:  Mike Williams, Mike Williams  Account Number:  [id number] Admit date:  01/11/2012  Clinical Social Worker:  Doroteo Glassman  Date/time:  01/14/2012 02:31 PM  Clinical Social Work is seeking post-discharge placement for this patient at the following level of care:   SKILLED NURSING   (*CSW will update this form in Epic as items are completed)   01/14/2012  Patient/family provided with [facility] Department of Clinical Social Work's list of facilities offering this level of care within the geographic area requested by the patient (or if unable, by the patient's family).  01/14/2012  Patient/family informed of their freedom to choose among providers that offer the needed level of care, that participate in Medicare, Medicaid or managed care program needed by the patient, have an available bed and are willing to accept the patient.  01/14/2012  Patient/family informed of [facility]' ownership interest in [facility], as well as of the fact that they are under no obligation to receive care at this facility.  PASARR submitted to EDS on 01/14/2012 PASARR number received from EDS on   FL2 transmitted to all facilities in geographic area requested by pt/family on  01/14/2012 FL2 transmitted to all facilities within larger geographic area on   Patient informed that his/her managed care company has contracts with or will negotiate with  certain facilities, including the following:     Patient/family informed of bed offers received:   Patient chooses bed at  Physician recommends and patient chooses bed at    Patient to be transferred to  on   Patient to be transferred to facility by   The following physician request were entered in Epic:   Additional Comments:  Providence Crosby, Theresia Majors Clinical Social Work [phone]

## 2012-01-14 NOTE — Progress Notes (Signed)
FL2 on chart for MD signature.  Amanda Emelina Hinch, LCSWA Clinical Social Work [phone]  

## 2012-01-14 NOTE — Evaluation (Signed)
Physical Therapy Evaluation Patient Details Name: Mike Williams MRN: [id number] DOB: 09-Sep-1932 Today's Date: 01/14/2012 Time: 5284-1324 PT Time Calculation (min): 20 min  PT Assessment / Plan / Recommendation Clinical Impression  Pt presents with pyelonephritis/UTI with history of dementia and prostate cancer.  Tolerated OOB and ambulation in hallway with RW, however noted instability and decreased safety when turning or maneuvering in room.  Pt will benefit from skilled PT in acute venue to address deficits.  PT recommends SNF for follow up to increase pt safety and pts wife is very concerned that she is not able to take care of him at this point.      PT Assessment  Patient needs continued PT services    Follow Up Recommendations  Skilled nursing facility    Barriers to Discharge Decreased caregiver support      Equipment Recommendations  None recommended by PT;Defer to next venue    Recommendations for Other Services     Frequency Min 3X/week    Precautions / Restrictions Precautions Precautions: Fall Restrictions Weight Bearing Restrictions: No   Pertinent Vitals/Pain No pain      Mobility  Bed Mobility Bed Mobility: Supine to Sit;Sit to Supine Supine to Sit: 4: Min assist;HOB elevated;With rails Sitting - Scoot to Edge of Bed: 4: Min assist Details for Bed Mobility Assistance: Assist to get hips in correct position on EOB with cues for hand placement and technique.  Transfers Transfers: Sit to Stand;Stand to Sit Sit to Stand: 4: Min assist;From elevated surface;With upper extremity assist;From bed Stand to Sit: 4: Min assist;With upper extremity assist;With armrests;To chair/3-in-1 Details for Transfer Assistance: Assist to rise and stabalize with cues for hand placement and safety when sitting/standing.   Ambulation/Gait Ambulation/Gait Assistance: 3: Mod assist Ambulation Distance (Feet): 120 Feet Assistive device: Rolling walker Ambulation/Gait Assistance  Details: Cues for technique and positioning with RW, esp when turning.  Noted delayed response to commands when turning.   Gait Pattern: Step-through pattern;Decreased stride length;Trunk flexed Gait velocity: decreased Stairs: No Wheelchair Mobility Wheelchair Mobility: No    Exercises     PT Diagnosis: Difficulty walking;Generalized weakness  PT Problem List: Decreased strength;Decreased activity tolerance;Decreased balance;Decreased mobility;Decreased cognition;Decreased knowledge of use of DME;Decreased safety awareness PT Treatment Interventions: DME instruction;Gait training;Functional mobility training;Therapeutic activities;Therapeutic exercise;Balance training;Patient/family education   PT Goals Acute Rehab PT Goals PT Goal Formulation: With patient Time For Goal Achievement: 01/21/12 Potential to Achieve Goals: Good Pt will go Supine/Side to Sit: with supervision PT Goal: Supine/Side to Sit - Progress: Goal set today Pt will go Sit to Supine/Side: with supervision PT Goal: Sit to Supine/Side - Progress: Goal set today Pt will go Sit to Stand: with supervision PT Goal: Sit to Stand - Progress: Goal set today Pt will go Stand to Sit: with supervision PT Goal: Stand to Sit - Progress: Goal set today Pt will Ambulate: 51 - 150 feet;with supervision;with least restrictive assistive device PT Goal: Ambulate - Progress: Goal set today  Visit Information  Last PT Received On: 01/14/12 Assistance Needed: +1    Subjective Data  Subjective: My wife hasn't told me why I'm here yet? Patient Stated Goal: n/a   Prior Functioning  Home Living Lives With: Spouse Available Help at Discharge: Family Type of Home: House Home Access: Stairs to enter Secretary/administrator of Steps: 3 Entrance Stairs-Rails: Right;Left;Can reach both Home Layout: One level Bathroom Shower/Tub: Health visitor: Standard Home Adaptive Equipment: Shower chair with back;Bedside  commode/3-in-1;Walker - rolling;Grab bars in shower  Prior Function Level of Independence: Needs assistance Needs Assistance: Bathing;Dressing;Meal Prep;Light Housekeeping Bath: Total Dressing: Total Meal Prep: Total Light Housekeeping: Total Able to Take Stairs?: Yes Driving: No Vocation: Retired Musician: No difficulties Dominant Hand: Right    Cognition  Overall Cognitive Status: History of cognitive impairments - further impaired Area of Impairment: Following commands;Awareness of errors Arousal/Alertness: Awake/alert Orientation Level: Disoriented to;Time;Situation Behavior During Session: [facility] for tasks performed Following Commands: Follows one step commands inconsistently Awareness of Errors: Assistance required to identify errors made;Assistance required to correct errors made Cognition - Other Comments: Pts wife states that he is somewhat more confused since being in hospital     Extremity/Trunk Assessment Right Upper Extremity Assessment RUE ROM/Strength/Tone: [facility] for tasks assessed Left Upper Extremity Assessment LUE ROM/Strength/Tone: [facility] for tasks assessed Right Lower Extremity Assessment RLE ROM/Strength/Tone: [facility] for tasks assessed Left Lower Extremity Assessment LLE ROM/Strength/Tone: [facility] for tasks assessed Trunk Assessment Trunk Assessment: Normal   Balance    End of Session PT - End of Session Equipment Utilized During Treatment: Gait belt Activity Tolerance: Patient limited by fatigue Patient left: in bed;with call bell/phone within reach;with bed alarm set;with family/visitor present Nurse Communication: Mobility status  GP     Page, Meribeth Mattes 01/14/2012, 11:32 AM

## 2012-01-15 LAB — VANCOMYCIN, TROUGH: Vancomycin Tr: 14.1 ug/mL (ref 10.0–20.0)

## 2012-01-15 LAB — BASIC METABOLIC PANEL
CO2: 26 mEq/L (ref 19–32)
Calcium: 8.1 mg/dL — ABNORMAL LOW (ref 8.4–10.5)
Creatinine, Ser: 1.21 mg/dL (ref 0.50–1.35)
GFR calc non Af Amer: 56 mL/min — ABNORMAL LOW (ref >90)

## 2012-01-15 NOTE — Progress Notes (Signed)
Speech Language Pathology Dysphagia Treatment Patient Details Name: Mike Williams MRN: [id number] DOB: 1932/06/10 Today's Date: 01/15/2012 Time: 8295-6213 SLP Time Calculation (min): 34 min  Assessment / Plan / Recommendation Clinical Impression  Pt seen to assess tolerance of diet and assure all education is complete.  Pt did cough once prior to any po intake, ? asp of secretions given oral secretions stasis noted during MBS.  SLP aided pt to brush his teeth (he was able to orally manipulate water for rinse and expectoration).  Per nurse tech, pt has not consumed any po intake today, as he declined all po offered.    SLP observed him consume crackers, icecream and gingerale without clinical s/s of aspiration. Pt did become tearful but when his wife arrived he had stopped crying - per spouse this occurs.    In addition, noted twitching on left side of face, notified RN who also observed this, spouse states this has occured since pt was 78 years old.     All education has been completed with this pt and family, no further SLP indicated.  Provided spouse with printed copies of MBS report.  Do recommend follow up at SNF for tolerance and modification as needed.  Thanks.    Diet Recommendation  Initiate / Change Diet: Regular;Thin liquid    SLP Plan All goals met   Pertinent Vitals/Pain Afebrile, decr   Swallowing Goals  SLP Swallowing Goals Swallow Study Goal #2 - Progress: Met  General Temperature Spikes Noted: No Respiratory Status: Room air Behavior/Cognition: Alert;Cooperative;Pleasant mood Oral Cavity - Dentition: Adequate natural dentition Patient Positioning: Upright in bed  Oral Cavity - Oral Hygiene   slp aided pt to brush his teeth, gums, tongue  Dysphagia Treatment Treatment focused on: Skilled observation of diet tolerance;Utilization of compensatory strategies;Patient/family/caregiver Dealer Educated: spouse Treatment Methods/Modalities: Skilled  observation Patient observed directly with PO's: Yes Type of PO's observed: Regular;Dysphagia 1 (puree) (icecream) Feeding: Able to feed self Liquids provided via: Straw Oral Phase Signs & Symptoms: Prolonged mastication (pt became tearful after first bite of graham cracker) Type of cueing: Verbal Amount of cueing: Minimal (to clear mouth before talking)   GO     Donavan Burnet, MS [facility] SLP [phone]

## 2012-01-15 NOTE — Progress Notes (Signed)
[facility] PROGRESS NOTE  Mike Williams ZOX:[id number] DOB: 1933-01-20 DOA: 01/11/2012 PCP: [name], MD  Assessment/Plan: Principal Problem:  *Pyelonephritis Active Problems:  DEMENTIA  HYPERTENSION  PROSTATE CANCER, HX OF  CEREBROVASCULAR ACCIDENT, HX OF  Chronic renal insufficiency, stage I  Aortic insufficiency     1. Recurrent UTI/pyelonephritis/pneumonia.   switched to vancomycin and Zosyn. blood cultures x2 NGSF. CT stone protocol shows 9 mm nonobstructive stone. The patient does not have any gross hematuria. 2. Hypertension continue to hold some of his antihypertensive medications with the exception of hydralazine, holding Benicar/HCTZ 3. Recent fall likely secondary to dehydration/orthostasis/weakness in the setting of his UTI/pyelonephritis. In SNF 4. History of CVA continue Plavix and aspirin 5. Dementia. Contribution to his intermittent falls and altered mental status. Continue to monitor 6. Persistent fever. Repeat chest x-ray shows pneumonia, speech therapy evaluation obtained, modified barium swallow done 7. Acute kidney injury, creatinine of 1.6 upon presentation now down to 1.22, likely prerenal, also holding Benicar 8. Disposition; not stable for DC at this time, most likely in 1-2 days  9.  HPI/Subjective:    Objective: Filed Vitals:   01/15/12 0126 01/15/12 0215 01/15/12 0500 01/15/12 0613  BP:  139/77  173/96  Pulse: 57 41  64  Temp:  98.2 F (36.8 C)  98 F (36.7 C)  TempSrc:  Oral  Oral  Resp:  18  18  Height:      Weight:   82 kg (180 lb 12.4 oz) 82 kg (180 lb 12.4 oz)  SpO2: 100% 99%  100%    Intake/Output Summary (Last 24 hours) at 01/15/12 8119 Last data filed at 01/15/12 0609  Gross per 24 hour  Intake    825 ml  Output    770 ml  Net     55 ml    Exam:  HENT:  Head: Atraumatic.  Nose: Nose normal.  Mouth/Throat: Oropharynx is clear and moist.  Eyes: Conjunctivae are normal. Pupils are equal, round, and reactive to  light. No scleral icterus.  Neck: Neck supple. No tracheal deviation present.  Cardiovascular: Normal rate, regular rhythm, normal heart sounds and intact distal pulses.  Pulmonary/Chest: Effort normal and breath sounds normal. No respiratory distress.  Abdominal: Soft. Normal appearance and bowel sounds are normal. She exhibits no distension. There is no tenderness.  Musculoskeletal: She exhibits no edema and no tenderness.  Neurological: She is alert. No cranial nerve deficit.    Data Reviewed: Basic Metabolic Panel:  Lab 01/15/12 1478 01/14/12 0505 01/12/12 1730 01/12/12 0450 01/11/12 1152 01/11/12 0616 01/11/12 0545  NA 139 138 -- 137 -- 139 134*  K 3.7 3.7 -- 3.8 -- 3.7 3.7  CL 107 104 -- 102 -- 101 96  CO2 26 26 -- 25 -- -- 27  GLUCOSE 91 104* -- 105* -- 112* 112*  BUN 10 10 -- 11 -- 15 14  CREATININE 1.21 1.22 -- 1.49* -- 1.60* 1.43*  CALCIUM 8.1* 8.1* -- 8.4 -- -- 9.5  MG -- -- 1.9 -- 1.9 -- --  PHOS -- -- -- -- -- -- --    Liver Function Tests:  Lab 01/11/12 1152 01/11/12 0545  AST 20 21  ALT 15 16  ALKPHOS 79 84  BILITOT 0.4 0.5  PROT 7.2 8.1  ALBUMIN 3.3* 3.7   No results found for this basename: LIPASE:5,AMYLASE:5 in the last 168 hours No results found for this basename: AMMONIA:5 in the last 168 hours  CBC:  Lab 01/12/12 0450 01/11/12 1152  01/11/12 0616 01/11/12 0545  WBC 5.6 5.7 -- 8.4  NEUTROABS -- -- -- --  HGB 12.2* 13.1 15.3 14.5  HCT 35.7* 37.5* 45.0 41.2  MCV 81.7 81.5 -- 80.9  PLT 227 238 -- 249    Cardiac Enzymes:  Lab 01/13/12 0455 01/12/12 2200 01/12/12 1643 01/11/12 2235 01/11/12 1609  CKTOTAL -- -- -- -- --  CKMB -- -- -- -- --  CKMBINDEX -- -- -- -- --  TROPONINI <0.30 <0.30 <0.30 <0.30 <0.30   BNP (last 3 results) No results found for this basename: PROBNP:3 in the last 8760 hours   CBG: No results found for this basename: GLUCAP:5 in the last 168 hours  Recent Results (from the past 240 hour(s))  URINE CULTURE     Status:  Normal   Collection Time   01/08/12 11:22 AM      Component Value Range Status Comment   Colony Count NO GROWTH   Final    Organism ID, Bacteria NO GROWTH   Final   CULTURE, BLOOD (ROUTINE X 2)     Status: Normal (Preliminary result)   Collection Time   01/11/12  9:45 AM      Component Value Range Status Comment   Specimen Description BLOOD RIGHT ANTECUBITAL   Final    Special Requests BOTTLES DRAWN AEROBIC AND ANAEROBIC 4CC   Final    Culture  Setup Time 01/11/2012 17:10   Final    Culture     Final    Value:        BLOOD CULTURE RECEIVED NO GROWTH TO DATE CULTURE WILL BE HELD FOR 5 DAYS BEFORE ISSUING A FINAL NEGATIVE REPORT   Report Status PENDING   Incomplete   CULTURE, BLOOD (ROUTINE X 2)     Status: Normal (Preliminary result)   Collection Time   01/11/12  9:56 AM      Component Value Range Status Comment   Specimen Description BLOOD LEFT HAND   Final    Special Requests BOTTLES DRAWN AEROBIC ONLY 1CC   Final    Culture  Setup Time 01/11/2012 17:10   Final    Culture     Final    Value:        BLOOD CULTURE RECEIVED NO GROWTH TO DATE CULTURE WILL BE HELD FOR 5 DAYS BEFORE ISSUING A FINAL NEGATIVE REPORT   Report Status PENDING   Incomplete   URINE CULTURE     Status: Normal   Collection Time   01/11/12  5:59 PM      Component Value Range Status Comment   Specimen Description URINE, CATHETERIZED   Final    Special Requests NONE   Final    Culture  Setup Time 01/12/2012 09:32   Final    Colony Count NO GROWTH   Final    Culture NO GROWTH   Final    Report Status 01/13/2012 FINAL   Final   CULTURE, BLOOD (ROUTINE X 2)     Status: Normal (Preliminary result)   Collection Time   01/12/12  4:10 PM      Component Value Range Status Comment   Specimen Description BLOOD RIGHT ARM   Final    Special Requests BOTTLES DRAWN AEROBIC AND ANAEROBIC 3.5CC   Final    Culture  Setup Time 01/13/2012 01:47   Final    Culture     Final    Value:        BLOOD CULTURE RECEIVED NO GROWTH TO DATE  CULTURE WILL BE HELD FOR 5 DAYS BEFORE ISSUING A FINAL NEGATIVE REPORT   Report Status PENDING   Incomplete   CULTURE, BLOOD (ROUTINE X 2)     Status: Normal (Preliminary result)   Collection Time   01/12/12  4:47 PM      Component Value Range Status Comment   Specimen Description BLOOD RIGHT ARM   Final    Special Requests BOTTLES DRAWN AEROBIC ONLY 2CC   Final    Culture  Setup Time 01/13/2012 01:47   Final    Culture     Final    Value:        BLOOD CULTURE RECEIVED NO GROWTH TO DATE CULTURE WILL BE HELD FOR 5 DAYS BEFORE ISSUING A FINAL NEGATIVE REPORT   Report Status PENDING   Incomplete      Studies: Ct Abdomen Pelvis Wo Contrast  01/11/2012  *RADIOLOGY REPORT*  Clinical Data: Abdominal pain.  Fever.  CT ABDOMEN AND PELVIS WITHOUT CONTRAST  Technique:  Multidetector CT imaging of the abdomen and pelvis was performed following the standard protocol without intravenous contrast.  Comparison: CT of the chest abdomen and pelvis 12/09/2009.  Findings:  Lung Bases: Minimal dependent subsegmental atelectasis in the lower lobes of the lungs bilaterally.  Mild ectasia of the descending thoracic aorta (3.1 cm in diameter).  Borderline cardiomegaly.  Abdomen/Pelvis:  9 mm nonobstructive calculus in the lower pole collecting system of the left kidney.  No additional calculi within the collecting system of the right kidney, along the course of the right ureter, or within the lumen of the urinary bladder.  No hydroureteronephrosis or perinephric stranding to suggest urinary tract obstruction at this time.  1.8 cm low attenuation lesion in the upper pole of the left kidney is incompletely characterized on today's examination, but similar in size to prior study from 12/09/2009, and likely represent a cyst.  Status post cholecystectomy.  The unenhanced appearance of the liver, pancreas, spleen and bilateral adrenal glands is unremarkable.  Atherosclerosis of the abdominal and pelvic vasculature, including  tortuosity and ectasia of the infrarenal abdominal aorta (up to 2.3 cm in diameter) and mild aneurysmal dilatation of the common iliac arteries bilaterally (16 mm on the left and 15 mm on the right).  Postoperative changes of TURP are noted, with a cystic area in the prostate and some dystrophic calcifications.  Urinary bladder is unremarkable in appearance. There are a few colonic diverticula, without surrounding inflammatory changes to suggest an acute diverticulitis at this time.  Normal appendix.  No ascites or pneumoperitoneum and no pathologic distension of bowel.  No definite pathologic lymphadenopathy identified within the abdomen or pelvis on this noncontrast CT examination.  Numerous prominent but nonenlarged retroperitoneal lymph nodes are conspicuous in number rather than size (a highly nonspecific finding).  Musculoskeletal: There are no aggressive appearing lytic or blastic lesions noted in the visualized portions of the skeleton.  Mild S- shaped scoliosis of the lumbar spine, convex to the right superiorly and to the left inferiorly.  IMPRESSION: 1.  No definite acute abnormality noted within the abdomen or pelvis to account for the patient's symptoms. 2.  There is a 9 mm nonobstructive calculus in the lower pole collecting system of the left kidney. 3. Postoperative changes  of TURP with apparent cystic region in the prostate gland which has multiple dystrophic calcifications associated with it. 4.  Atherosclerosis, as above. 5.  Status post cholecystectomy. 6.  Mild colonic diverticulosis without findings to suggest acute diverticulitis at  this time. 7.  Normal appendix. 8.  Additional incidental findings, as above.   Original Report Authenticated By: Florencia Reasons, [name]    Dg Chest 2 View  01/12/2012  *RADIOLOGY REPORT*  Clinical Data: Cough.  Fever.  History of prostate cancer.  CHEST - 2 VIEW  Comparison: Two-view chest x-ray yesterday and 10/26/2009.  Findings: Interval development of a  focal opacity in the lingula and left lower lobe since the examination yesterday.  Lungs remain clear otherwise.  Cardiac silhouette mildly enlarged but stable. Thoracic aorta tortuous atherosclerotic, unchanged.  No pleural effusions.  Degenerative changes involving the thoracic spine. Lateral image less than optimal as patient was unable to raise the arms.  IMPRESSION: Developing atelectasis and/or pneumonia in the lingula and left lower lobe.   Original Report Authenticated By: Arnell Sieving, [name]    Dg Chest 2 View  01/11/2012  *RADIOLOGY REPORT*  Clinical Data: Fever.  UTI.  CHEST - 2 VIEW  Comparison: 12/07/2009.  Findings: Shallow inspiration with elevation of the left hemidiaphragm.  Heart size and pulmonary vascularity are normal for technique.  No focal airspace consolidation.  No blunting of costophrenic angles.  No pneumothorax.  Mediastinal contours appear intact.  Tortuous aorta.  Probable aortic ectasia.  Surgical clips in the right upper quadrant.  Probably no significant change since previous study, allowing for different inspiratory effort.  IMPRESSION: Tortuous and ectatic aorta.  Shallow inspiration.  No active pulmonary disease.   Original Report Authenticated By: Marlon Pel, [name]     Scheduled Meds:   . aspirin  81 mg Oral Daily  . clopidogrel  75 mg Oral Daily  . darifenacin  7.5 mg Oral Daily  . enoxaparin (LOVENOX) injection  40 mg Subcutaneous Q24H  . hydrALAZINE  25 mg Oral Q8H  . influenza  inactive virus vaccine  0.5 mL Intramuscular Tomorrow-1000  . levETIRAcetam  250 mg Oral BID  . piperacillin-tazobactam (ZOSYN)  IV  3.375 g Intravenous Q8H  . sodium chloride  3 mL Intravenous Q12H  . vancomycin  750 mg Intravenous Q12H   Continuous Infusions:   . sodium chloride 125 mL/hr at 01/14/12 0544    Principal Problem:  *Pyelonephritis Active Problems:  DEMENTIA  HYPERTENSION  PROSTATE CANCER, HX OF  CEREBROVASCULAR ACCIDENT, HX OF  Chronic renal  insufficiency, stage I  Aortic insufficiency    Time spent: 40 minutes   [facility]  [facility] Pager [phone]. If 8PM-8AM, please contact night-coverage at [email or web address], password [place] 01/15/2012, 8:22 AM  LOS: 4 days

## 2012-01-15 NOTE — Progress Notes (Addendum)
Left bed offers in Pt's room.  Left message for Pt's wife alerting her to the bed offers left his Pt's room.  Spoke with Pt.  Notified him that CSW left bed offers for him and his wife in his room.  Pt thanked CSW for assistance.  CSW thanked Pt for his time.  CSW to continue to follow.  Providence Crosby, LCSWA Clinical Social Work [phone]

## 2012-01-15 NOTE — Progress Notes (Signed)
Met with Pt and his wife.  Reviewed bed offers with wife.  Wife to spend the night with Pt tonight and visit facilities tomorrow.  CSW thanked the [facility] for their time.  CSW to continue to follow.  Providence Crosby, LCSWA Clinical Social Work [phone]

## 2012-01-16 DIAGNOSIS — J690 Pneumonitis due to inhalation of food and vomit: Secondary | ICD-10-CM | POA: Diagnosis not present

## 2012-01-16 DIAGNOSIS — I69959 Hemiplegia and hemiparesis following unspecified cerebrovascular disease affecting unspecified side: Secondary | ICD-10-CM | POA: Diagnosis not present

## 2012-01-16 DIAGNOSIS — R1312 Dysphagia, oropharyngeal phase: Secondary | ICD-10-CM | POA: Diagnosis not present

## 2012-01-16 DIAGNOSIS — F0390 Unspecified dementia without behavioral disturbance: Secondary | ICD-10-CM | POA: Diagnosis not present

## 2012-01-16 DIAGNOSIS — N318 Other neuromuscular dysfunction of bladder: Secondary | ICD-10-CM | POA: Diagnosis not present

## 2012-01-16 DIAGNOSIS — R569 Unspecified convulsions: Secondary | ICD-10-CM | POA: Diagnosis not present

## 2012-01-16 DIAGNOSIS — G459 Transient cerebral ischemic attack, unspecified: Secondary | ICD-10-CM | POA: Diagnosis not present

## 2012-01-16 DIAGNOSIS — M25559 Pain in unspecified hip: Secondary | ICD-10-CM | POA: Diagnosis not present

## 2012-01-16 DIAGNOSIS — N390 Urinary tract infection, site not specified: Secondary | ICD-10-CM | POA: Diagnosis not present

## 2012-01-16 DIAGNOSIS — D531 Other megaloblastic anemias, not elsewhere classified: Secondary | ICD-10-CM | POA: Diagnosis not present

## 2012-01-16 DIAGNOSIS — N12 Tubulo-interstitial nephritis, not specified as acute or chronic: Secondary | ICD-10-CM | POA: Diagnosis not present

## 2012-01-16 DIAGNOSIS — N159 Renal tubulo-interstitial disease, unspecified: Secondary | ICD-10-CM | POA: Diagnosis not present

## 2012-01-16 DIAGNOSIS — D649 Anemia, unspecified: Secondary | ICD-10-CM | POA: Diagnosis not present

## 2012-01-16 DIAGNOSIS — E039 Hypothyroidism, unspecified: Secondary | ICD-10-CM | POA: Diagnosis not present

## 2012-01-16 DIAGNOSIS — Z9181 History of falling: Secondary | ICD-10-CM | POA: Diagnosis not present

## 2012-01-16 DIAGNOSIS — Z5189 Encounter for other specified aftercare: Secondary | ICD-10-CM | POA: Diagnosis not present

## 2012-01-16 DIAGNOSIS — I10 Essential (primary) hypertension: Secondary | ICD-10-CM | POA: Diagnosis not present

## 2012-01-16 LAB — BASIC METABOLIC PANEL
CO2: 27 mEq/L (ref 19–32)
Calcium: 8.5 mg/dL (ref 8.4–10.5)
Creatinine, Ser: 1.18 mg/dL (ref 0.50–1.35)
GFR calc non Af Amer: 58 mL/min — ABNORMAL LOW (ref >90)
Glucose, Bld: 94 mg/dL (ref 70–99)

## 2012-01-16 MED ORDER — LEVALBUTEROL HCL 0.63 MG/3ML IN NEBU: 0.6300 mg | INHALATION_SOLUTION | Freq: Four times a day (QID) | RESPIRATORY_TRACT | Status: DC | PRN

## 2012-01-16 MED ORDER — VANCOMYCIN HCL IN DEXTROSE 1-5 GM/200ML-% IV SOLN
1000.0000 mg | Freq: Two times a day (BID) | INTRAVENOUS | Status: DC
  Administered 2012-01-16: 1000 mg via INTRAVENOUS
  Filled 2012-01-16: qty 200

## 2012-01-16 MED ORDER — POTASSIUM CHLORIDE CRYS ER 20 MEQ PO TBCR
20.0000 meq | EXTENDED_RELEASE_TABLET | Freq: Two times a day (BID) | ORAL | Status: DC
  Administered 2012-01-16: 20 meq via ORAL
  Filled 2012-01-16 (×2): qty 1

## 2012-01-16 MED ORDER — OLMESARTAN MEDOXOMIL 20 MG PO TABS: 20.0000 mg | ORAL_TABLET | Freq: Every day | ORAL | Status: DC

## 2012-01-16 MED ORDER — LEVOFLOXACIN 500 MG PO TABS: 500.0000 mg | ORAL_TABLET | Freq: Every day | ORAL | Status: AC

## 2012-01-16 MED ORDER — HYDRALAZINE HCL 50 MG PO TABS
50.0000 mg | ORAL_TABLET | Freq: Three times a day (TID) | ORAL | Status: DC
  Filled 2012-01-16 (×3): qty 1

## 2012-01-16 MED ORDER — HYDRALAZINE HCL 25 MG PO TABS: 50.0000 mg | ORAL_TABLET | Freq: Four times a day (QID) | ORAL | Status: DC

## 2012-01-16 MED ORDER — LEVOFLOXACIN 500 MG PO TABS
500.0000 mg | ORAL_TABLET | Freq: Every day | ORAL | Status: DC
  Administered 2012-01-16: 500 mg via ORAL
  Filled 2012-01-16: qty 1

## 2012-01-16 NOTE — Progress Notes (Signed)
Discharge instructions accompanied pt. Left the unit in stable condition to be transported via ambulance to SNF.

## 2012-01-16 NOTE — Progress Notes (Signed)
ANTIBIOTIC CONSULT NOTE - FOLLOW UP  Pharmacy Consult for vancomycin Indication: rule out pneumonia  No Known Allergies  Patient Measurements: Height: 5\' 7"  (170.2 cm) Weight: 180 lb 12.4 oz (82 kg) (bed scale) IBW/kg (Calculated) : 66.1  Adjusted Body Weight:   Vital Signs: Temp: 97.9 F (36.6 C) (09/12 2212) Temp src: Oral (09/12 2212) BP: 176/78 mmHg (09/12 2212) Pulse Rate: 52  (09/12 2212) Intake/Output from previous day:   Intake/Output from this shift:    Labs:  Basename 01/15/12 0505 01/14/12 0505  WBC -- --  HGB -- --  PLT -- --  LABCREA -- --  CREATININE 1.21 1.22   Estimated Creatinine Clearance: 53.3 ml/min (by C-G formula based on Cr of 1.21).  Basename 01/15/12 2246  VANCOTROUGH 14.1  VANCOPEAK --  VANCORANDOM --  GENTTROUGH --  GENTPEAK --  GENTRANDOM --  TOBRATROUGH --  TOBRAPEAK --  TOBRARND --  AMIKACINPEAK --  AMIKACINTROU --  AMIKACIN --     Microbiology: Recent Results (from the past 720 hour(s))  URINE CULTURE     Status: Normal   Collection Time   01/08/12 11:22 AM      Component Value Range Status Comment   Colony Count NO GROWTH   Final    Organism ID, Bacteria NO GROWTH   Final   CULTURE, BLOOD (ROUTINE X 2)     Status: Normal (Preliminary result)   Collection Time   01/11/12  9:45 AM      Component Value Range Status Comment   Specimen Description BLOOD RIGHT ANTECUBITAL   Final    Special Requests BOTTLES DRAWN AEROBIC AND ANAEROBIC 4CC   Final    Culture  Setup Time 01/11/2012 17:10   Final    Culture     Final    Value:        BLOOD CULTURE RECEIVED NO GROWTH TO DATE CULTURE WILL BE HELD FOR 5 DAYS BEFORE ISSUING A FINAL NEGATIVE REPORT   Report Status PENDING   Incomplete   CULTURE, BLOOD (ROUTINE X 2)     Status: Normal (Preliminary result)   Collection Time   01/11/12  9:56 AM      Component Value Range Status Comment   Specimen Description BLOOD LEFT HAND   Final    Special Requests BOTTLES DRAWN AEROBIC ONLY 1CC    Final    Culture  Setup Time 01/11/2012 17:10   Final    Culture     Final    Value:        BLOOD CULTURE RECEIVED NO GROWTH TO DATE CULTURE WILL BE HELD FOR 5 DAYS BEFORE ISSUING A FINAL NEGATIVE REPORT   Report Status PENDING   Incomplete   URINE CULTURE     Status: Normal   Collection Time   01/11/12  5:59 PM      Component Value Range Status Comment   Specimen Description URINE, CATHETERIZED   Final    Special Requests NONE   Final    Culture  Setup Time 01/12/2012 09:32   Final    Colony Count NO GROWTH   Final    Culture NO GROWTH   Final    Report Status 01/13/2012 FINAL   Final   CULTURE, BLOOD (ROUTINE X 2)     Status: Normal (Preliminary result)   Collection Time   01/12/12  4:10 PM      Component Value Range Status Comment   Specimen Description BLOOD RIGHT ARM   Final  Special Requests BOTTLES DRAWN AEROBIC AND ANAEROBIC 3.5CC   Final    Culture  Setup Time 01/13/2012 01:47   Final    Culture     Final    Value:        BLOOD CULTURE RECEIVED NO GROWTH TO DATE CULTURE WILL BE HELD FOR 5 DAYS BEFORE ISSUING A FINAL NEGATIVE REPORT   Report Status PENDING   Incomplete   CULTURE, BLOOD (ROUTINE X 2)     Status: Normal (Preliminary result)   Collection Time   01/12/12  4:47 PM      Component Value Range Status Comment   Specimen Description BLOOD RIGHT ARM   Final    Special Requests BOTTLES DRAWN AEROBIC ONLY 2CC   Final    Culture  Setup Time 01/13/2012 01:47   Final    Culture     Final    Value:        BLOOD CULTURE RECEIVED NO GROWTH TO DATE CULTURE WILL BE HELD FOR 5 DAYS BEFORE ISSUING A FINAL NEGATIVE REPORT   Report Status PENDING   Incomplete     Anti-infectives     Start     Dose/Rate Route Frequency Ordered Stop   01/16/12 0800   vancomycin (VANCOCIN) IVPB 1000 mg/200 mL premix        1,000 mg 200 mL/hr over 60 Minutes Intravenous Every 12 hours 01/16/12 0344     01/12/12 1600   vancomycin (VANCOCIN) 750 mg in sodium chloride 0.9 % 150 mL IVPB        750  mg 150 mL/hr over 60 Minutes Intravenous Every 12 hours 01/12/12 1556     01/12/12 0600   cefTRIAXone (ROCEPHIN) 1 g in dextrose 5 % 50 mL IVPB  Status:  Discontinued        1 g 100 mL/hr over 30 Minutes Intravenous Every 24 hours 01/11/12 1022 01/11/12 2135   01/11/12 2200  piperacillin-tazobactam (ZOSYN) IVPB 3.375 g       3.375 g 12.5 mL/hr over 240 Minutes Intravenous Every 8 hours 01/11/12 2148     01/11/12 0445   cefTRIAXone (ROCEPHIN) 1 g in dextrose 5 % 50 mL IVPB        1 g 100 mL/hr over 30 Minutes Intravenous  Once 01/11/12 0437 01/11/12 0630          Assessment: Patient with low vancomycin level.  Goal of Therapy:  Vancomycin trough level 15-20 mcg/ml  Plan:  Measure antibiotic drug levels at steady state Follow up culture results Change to 1gm iv q12hr vancomycin  Mike Williams 01/16/2012,3:44 AM

## 2012-01-16 NOTE — Progress Notes (Signed)
Per MD, Pt medically ready for d/c.  Notified Pt's wife.  Pt's wife toured [place] today and notified facility, [facility], that she'd like for Pt to receive rehab there.  Notified facility.  Facility ready to receive Pt.  Faxed d/c summary.  Notified Care Coordinator, Chastity.    Arranged for transportation.  Pt to be d/c'd.  Providence Crosby, LCSWA Clinical Social Work [phone]

## 2012-01-16 NOTE — Discharge Summary (Signed)
Physician Discharge Summary  Mike Williams MRN: [id number] DOB/AGE: 76/26/1934 76 y.o.  PCP: [name], MD   Admit date: 01/11/2012 Discharge date: 01/16/2012  Discharge Diagnoses:  Aspiration pneumonia  *Pyelonephritis Active Problems:  DEMENTIA  HYPERTENSION  PROSTATE CANCER, HX OF  CEREBROVASCULAR ACCIDENT, HX OF  Chronic renal insufficiency, stage I  Aortic insufficiency     Medication List     As of 01/16/2012 10:51 AM    STOP taking these medications         BENICAR HCT 40-12.5 MG per tablet   Generic drug: olmesartan-hydrochlorothiazide      ciprofloxacin 500 MG tablet   Commonly known as: CIPRO      sulfamethoxazole-trimethoprim 800-160 MG per tablet   Commonly known as: BACTRIM DS,SEPTRA DS      TAKE these medications         aspirin 81 MG chewable tablet   Chew 1 tablet (81 mg total) by mouth daily.      clopidogrel 75 MG tablet   Commonly known as: PLAVIX   Take 1 tablet (75 mg total) by mouth daily.      darifenacin 7.5 MG 24 hr tablet   Commonly known as: ENABLEX   Take 1 tablet (7.5 mg total) by mouth daily.      felodipine 5 MG 24 hr tablet   Commonly known as: PLENDIL   take 1 tablet by mouth once daily      hydrALAZINE 25 MG tablet   Commonly known as: APRESOLINE   Take 2 tablets (50 mg total) by mouth 4 (four) times daily.      levalbuterol 0.63 MG/3ML nebulizer solution   Commonly known as: XOPENEX   Take 3 mLs (0.63 mg total) by nebulization every 6 (six) hours as needed for wheezing or shortness of breath.      levETIRAcetam 250 MG tablet   Commonly known as: KEPPRA   Take 1 tablet (250 mg total) by mouth 2 (two) times daily.      levofloxacin 500 MG tablet   Commonly known as: LEVAQUIN   Take 1 tablet (500 mg total) by mouth daily.      olmesartan 20 MG tablet   Commonly known as: BENICAR   Take 1 tablet (20 mg total) by mouth daily.        Discharge Condition: sTable  Disposition: Skilled nursing  facility   Consults:   Significant Diagnostic Studies: Ct Abdomen Pelvis Wo Contrast  01/11/2012  *RADIOLOGY REPORT*  Clinical Data: Abdominal pain.  Fever.  CT ABDOMEN AND PELVIS WITHOUT CONTRAST  Technique:  Multidetector CT imaging of the abdomen and pelvis was performed following the standard protocol without intravenous contrast.  Comparison: CT of the chest abdomen and pelvis 12/09/2009.  Findings:  Lung Bases: Minimal dependent subsegmental atelectasis in the lower lobes of the lungs bilaterally.  Mild ectasia of the descending thoracic aorta (3.1 cm in diameter).  Borderline cardiomegaly.  Abdomen/Pelvis:  9 mm nonobstructive calculus in the lower pole collecting system of the left kidney.  No additional calculi within the collecting system of the right kidney, along the course of the right ureter, or within the lumen of the urinary bladder.  No hydroureteronephrosis or perinephric stranding to suggest urinary tract obstruction at this time.  1.8 cm low attenuation lesion in the upper pole of the left kidney is incompletely characterized on today's examination, but similar in size to prior study from 12/09/2009, and likely represent a cyst.  Status post cholecystectomy.  The unenhanced appearance of the liver, pancreas, spleen and bilateral adrenal glands is unremarkable.  Atherosclerosis of the abdominal and pelvic vasculature, including tortuosity and ectasia of the infrarenal abdominal aorta (up to 2.3 cm in diameter) and mild aneurysmal dilatation of the common iliac arteries bilaterally (16 mm on the left and 15 mm on the right).  Postoperative changes of TURP are noted, with a cystic area in the prostate and some dystrophic calcifications.  Urinary bladder is unremarkable in appearance. There are a few colonic diverticula, without surrounding inflammatory changes to suggest an acute diverticulitis at this time.  Normal appendix.  No ascites or pneumoperitoneum and no pathologic distension of  bowel.  No definite pathologic lymphadenopathy identified within the abdomen or pelvis on this noncontrast CT examination.  Numerous prominent but nonenlarged retroperitoneal lymph nodes are conspicuous in number rather than size (a highly nonspecific finding).  Musculoskeletal: There are no aggressive appearing lytic or blastic lesions noted in the visualized portions of the skeleton.  Mild S- shaped scoliosis of the lumbar spine, convex to the right superiorly and to the left inferiorly.  IMPRESSION: 1.  No definite acute abnormality noted within the abdomen or pelvis to account for the patient's symptoms. 2.  There is a 9 mm nonobstructive calculus in the lower pole collecting system of the left kidney. 3. Postoperative changes  of TURP with apparent cystic region in the prostate gland which has multiple dystrophic calcifications associated with it. 4.  Atherosclerosis, as above. 5.  Status post cholecystectomy. 6.  Mild colonic diverticulosis without findings to suggest acute diverticulitis at this time. 7.  Normal appendix. 8.  Additional incidental findings, as above.   Original Report Authenticated By: Florencia Reasons, [name]    Dg Chest 2 View  01/12/2012  *RADIOLOGY REPORT*  Clinical Data: Cough.  Fever.  History of prostate cancer.  CHEST - 2 VIEW  Comparison: Two-view chest x-ray yesterday and 10/26/2009.  Findings: Interval development of a focal opacity in the lingula and left lower lobe since the examination yesterday.  Lungs remain clear otherwise.  Cardiac silhouette mildly enlarged but stable. Thoracic aorta tortuous atherosclerotic, unchanged.  No pleural effusions.  Degenerative changes involving the thoracic spine. Lateral image less than optimal as patient was unable to raise the arms.  IMPRESSION: Developing atelectasis and/or pneumonia in the lingula and left lower lobe.   Original Report Authenticated By: Arnell Sieving, [name]    Dg Chest 2 View  01/11/2012  *RADIOLOGY REPORT*   Clinical Data: Fever.  UTI.  CHEST - 2 VIEW  Comparison: 12/07/2009.  Findings: Shallow inspiration with elevation of the left hemidiaphragm.  Heart size and pulmonary vascularity are normal for technique.  No focal airspace consolidation.  No blunting of costophrenic angles.  No pneumothorax.  Mediastinal contours appear intact.  Tortuous aorta.  Probable aortic ectasia.  Surgical clips in the right upper quadrant.  Probably no significant change since previous study, allowing for different inspiratory effort.  IMPRESSION: Tortuous and ectatic aorta.  Shallow inspiration.  No active pulmonary disease.   Original Report Authenticated By: Marlon Pel, [name]       Microbiology: Recent Results (from the past 240 hour(s))  URINE CULTURE     Status: Normal   Collection Time   01/08/12 11:22 AM      Component Value Range Status Comment   Colony Count NO GROWTH   Final    Organism ID, Bacteria NO GROWTH   Final   CULTURE, BLOOD (ROUTINE X  2)     Status: Normal (Preliminary result)   Collection Time   01/11/12  9:45 AM      Component Value Range Status Comment   Specimen Description BLOOD RIGHT ANTECUBITAL   Final    Special Requests BOTTLES DRAWN AEROBIC AND ANAEROBIC 4CC   Final    Culture  Setup Time 01/11/2012 17:10   Final    Culture     Final    Value:        BLOOD CULTURE RECEIVED NO GROWTH TO DATE CULTURE WILL BE HELD FOR 5 DAYS BEFORE ISSUING A FINAL NEGATIVE REPORT   Report Status PENDING   Incomplete   CULTURE, BLOOD (ROUTINE X 2)     Status: Normal (Preliminary result)   Collection Time   01/11/12  9:56 AM      Component Value Range Status Comment   Specimen Description BLOOD LEFT HAND   Final    Special Requests BOTTLES DRAWN AEROBIC ONLY 1CC   Final    Culture  Setup Time 01/11/2012 17:10   Final    Culture     Final    Value:        BLOOD CULTURE RECEIVED NO GROWTH TO DATE CULTURE WILL BE HELD FOR 5 DAYS BEFORE ISSUING A FINAL NEGATIVE REPORT   Report Status PENDING    Incomplete   URINE CULTURE     Status: Normal   Collection Time   01/11/12  5:59 PM      Component Value Range Status Comment   Specimen Description URINE, CATHETERIZED   Final    Special Requests NONE   Final    Culture  Setup Time 01/12/2012 09:32   Final    Colony Count NO GROWTH   Final    Culture NO GROWTH   Final    Report Status 01/13/2012 FINAL   Final   CULTURE, BLOOD (ROUTINE X 2)     Status: Normal (Preliminary result)   Collection Time   01/12/12  4:10 PM      Component Value Range Status Comment   Specimen Description BLOOD RIGHT ARM   Final    Special Requests BOTTLES DRAWN AEROBIC AND ANAEROBIC 3.5CC   Final    Culture  Setup Time 01/13/2012 01:47   Final    Culture     Final    Value:        BLOOD CULTURE RECEIVED NO GROWTH TO DATE CULTURE WILL BE HELD FOR 5 DAYS BEFORE ISSUING A FINAL NEGATIVE REPORT   Report Status PENDING   Incomplete   CULTURE, BLOOD (ROUTINE X 2)     Status: Normal (Preliminary result)   Collection Time   01/12/12  4:47 PM      Component Value Range Status Comment   Specimen Description BLOOD RIGHT ARM   Final    Special Requests BOTTLES DRAWN AEROBIC ONLY 2CC   Final    Culture  Setup Time 01/13/2012 01:47   Final    Culture     Final    Value:        BLOOD CULTURE RECEIVED NO GROWTH TO DATE CULTURE WILL BE HELD FOR 5 DAYS BEFORE ISSUING A FINAL NEGATIVE REPORT   Report Status PENDING   Incomplete      Labs: Results for orders placed during the hospital encounter of 01/11/12 (from the past 48 hour(s))  BASIC METABOLIC PANEL     Status: Abnormal   Collection Time   01/15/12  5:05 AM  Component Value Range Comment   Sodium 139  135 - 145 mEq/L    Potassium 3.7  3.5 - 5.1 mEq/L    Chloride 107  96 - 112 mEq/L    CO2 26  19 - 32 mEq/L    Glucose, Bld 91  70 - 99 mg/dL    BUN 10  6 - 23 mg/dL    Creatinine, Ser 1.61  0.50 - 1.35 mg/dL    Calcium 8.1 (*) 8.4 - 10.5 mg/dL    GFR calc non Af Amer 56 (*) >90 mL/min    GFR calc Af Amer 65 (*)  >90 mL/min   VANCOMYCIN, TROUGH     Status: Normal   Collection Time   01/15/12 10:46 PM      Component Value Range Comment   Vancomycin Tr 14.1  10.0 - 20.0 ug/mL   BASIC METABOLIC PANEL     Status: Abnormal   Collection Time   01/16/12  5:00 AM      Component Value Range Comment   Sodium 137  135 - 145 mEq/L    Potassium 3.4 (*) 3.5 - 5.1 mEq/L    Chloride 104  96 - 112 mEq/L    CO2 27  19 - 32 mEq/L    Glucose, Bld 94  70 - 99 mg/dL    BUN 10  6 - 23 mg/dL    Creatinine, Ser 0.96  0.50 - 1.35 mg/dL    Calcium 8.5  8.4 - 04.5 mg/dL    GFR calc non Af Amer 58 (*) >90 mL/min    GFR calc Af Amer 67 (*) >90 mL/min      HPI :78 year old male with a history of prostrate surgery due to prostrate cancer, multiple urinary tract infections, recent UTI on ciprofloxacin and Bactrim at home who presents to the ER with a fever of 101F as well as weakness. A day prior to admission, the patient fell trying to get to the bathroom. He did not sustain any apparent injuries. He has had some intermittent confusion. No chest pain no cough no shortness of breath.   HOSPITAL COURSE:  #1 fever upon further investigation the patient was found to have Recurrent UTI/pyelonephritis/pneumonia. Started on vancomycin and Zosyn. blood cultures x2 NGSF. Now switched to levofloxacin for 7 more days . Given his renal insufficiency initially the patient had CT stone protocol that showed 9 mm nonobstructive stone. There are persistent concerns about patient  aspirating.  #2 suspected aspiration pneumonia, speech therapy consultation was obtained in the modified been swallow was done, the assessment was as follows  Pt seen to assess tolerance of diet and assure all education is complete. Pt did cough once prior to any po intake, ? asp of secretions given oral secretions stasis noted during MBS. SLP aided pt to brush his teeth (he was able to orally manipulate water for rinse and expectoration). Per nurse tech, pt has not  consumed any po intake today, as he declined all po offered.  SLP observed him consume crackers, icecream and gingerale without clinical s/s of aspiration. Pt did become tearful but when his wife arrived he had stopped crying - per spouse this occurs.  In addition, noted twitching on left side of face, notified RN who also observed this, spouse states this has occured since pt was 78 years old.  All education has been completed with this pt and family, no further SLP indicated. Provided spouse with printed copies of MBS report. Do recommend follow up  at SNF for tolerance and modification as needed. Thanks.  Diet Recommendation  Initiate / Change Diet: Regular;Thin liquid    #3 Hypertension benicar/HCTZ held initially due to renal insufficiency, hydralazine increased to 50 mg 4 times a day. Patient to resume Benicar at 20 mg by mouth daily, will need close outpatient monitoring of his creatinine  #4 Recent fall likely secondary to dehydration/orthostasis/weakness in the setting of his UTI/pyelonephritis. Being discharged to SNF  #5 History of CVA continue Plavix and aspirin  #6 Dementia. Contributing  to his intermittent falls and altered mental status. Continue to monitor   #7 Acute kidney injury, creatinine of 1.6 upon presentation now down to 1.18,  likely prerenal, also resuming Benicar therefore will need close monitoring of his creatinine     Discharge Exam:  Blood pressure 171/86, pulse 52, temperature 97.9 F (36.6 C), temperature source Oral, resp. rate 17, height 5\' 7"  (1.702 m), weight 82.1 kg (181 lb), SpO2 97.00%.    Head: Atraumatic.  Nose: Nose normal.  Mouth/Throat: Oropharynx is clear and moist.  Eyes: Conjunctivae are normal. Pupils are equal, round, and reactive to light. No scleral icterus.  Neck: Neck supple. No tracheal deviation present.  Cardiovascular: Normal rate, regular rhythm, normal heart sounds and intact distal pulses.  Pulmonary/Chest: Effort normal  and breath sounds normal. No respiratory distress.  Abdominal: Soft. Normal appearance and bowel sounds are normal. She exhibits no distension. There is no tenderness.  Musculoskeletal: She exhibits no edema and no tenderness.  Neurological: He is alert. No cranial nerve deficit      Discharge Orders    Future Appointments: Provider: Department: Dept Phone: Center:   05/24/2012 9:00 AM Lindley Magnus, MD [facility] [phone] [facility]        Signed: Richarda Overlie 01/16/2012, 10:51 AM

## 2012-01-17 LAB — CULTURE, BLOOD (ROUTINE X 2): Culture: NO GROWTH

## 2012-01-19 LAB — CULTURE, BLOOD (ROUTINE X 2): Culture: NO GROWTH

## 2012-01-21 DIAGNOSIS — R569 Unspecified convulsions: Secondary | ICD-10-CM | POA: Diagnosis not present

## 2012-01-21 DIAGNOSIS — N159 Renal tubulo-interstitial disease, unspecified: Secondary | ICD-10-CM | POA: Diagnosis not present

## 2012-01-21 DIAGNOSIS — I10 Essential (primary) hypertension: Secondary | ICD-10-CM | POA: Diagnosis not present

## 2012-01-21 DIAGNOSIS — J690 Pneumonitis due to inhalation of food and vomit: Secondary | ICD-10-CM | POA: Diagnosis not present

## 2012-02-20 DIAGNOSIS — I129 Hypertensive chronic kidney disease with stage 1 through stage 4 chronic kidney disease, or unspecified chronic kidney disease: Secondary | ICD-10-CM | POA: Diagnosis not present

## 2012-02-20 DIAGNOSIS — I69959 Hemiplegia and hemiparesis following unspecified cerebrovascular disease affecting unspecified side: Secondary | ICD-10-CM | POA: Diagnosis not present

## 2012-02-20 DIAGNOSIS — R1312 Dysphagia, oropharyngeal phase: Secondary | ICD-10-CM | POA: Diagnosis not present

## 2012-02-20 DIAGNOSIS — N181 Chronic kidney disease, stage 1: Secondary | ICD-10-CM | POA: Diagnosis not present

## 2012-02-20 DIAGNOSIS — F0390 Unspecified dementia without behavioral disturbance: Secondary | ICD-10-CM | POA: Diagnosis not present

## 2012-02-23 DIAGNOSIS — I69959 Hemiplegia and hemiparesis following unspecified cerebrovascular disease affecting unspecified side: Secondary | ICD-10-CM | POA: Diagnosis not present

## 2012-02-23 DIAGNOSIS — N181 Chronic kidney disease, stage 1: Secondary | ICD-10-CM | POA: Diagnosis not present

## 2012-02-23 DIAGNOSIS — F0390 Unspecified dementia without behavioral disturbance: Secondary | ICD-10-CM | POA: Diagnosis not present

## 2012-02-23 DIAGNOSIS — R1312 Dysphagia, oropharyngeal phase: Secondary | ICD-10-CM | POA: Diagnosis not present

## 2012-02-23 DIAGNOSIS — I129 Hypertensive chronic kidney disease with stage 1 through stage 4 chronic kidney disease, or unspecified chronic kidney disease: Secondary | ICD-10-CM | POA: Diagnosis not present

## 2012-02-24 ENCOUNTER — Telehealth: Payer: Self-pay | Admitting: Internal Medicine

## 2012-02-24 DIAGNOSIS — R1312 Dysphagia, oropharyngeal phase: Secondary | ICD-10-CM | POA: Diagnosis not present

## 2012-02-24 DIAGNOSIS — I69959 Hemiplegia and hemiparesis following unspecified cerebrovascular disease affecting unspecified side: Secondary | ICD-10-CM | POA: Diagnosis not present

## 2012-02-24 DIAGNOSIS — F0390 Unspecified dementia without behavioral disturbance: Secondary | ICD-10-CM | POA: Diagnosis not present

## 2012-02-24 DIAGNOSIS — N181 Chronic kidney disease, stage 1: Secondary | ICD-10-CM | POA: Diagnosis not present

## 2012-02-24 DIAGNOSIS — I129 Hypertensive chronic kidney disease with stage 1 through stage 4 chronic kidney disease, or unspecified chronic kidney disease: Secondary | ICD-10-CM | POA: Diagnosis not present

## 2012-02-24 NOTE — Telephone Encounter (Signed)
Pts spouse called and said pt needs a hosp fup with pcp only this wk. Pls advise.

## 2012-02-24 NOTE — Telephone Encounter (Signed)
Pt can come in this Thursday at 8

## 2012-02-24 NOTE — Telephone Encounter (Signed)
Called pts spouse and sch pt for hosp fup on Thurs at 8am as noted.

## 2012-02-26 ENCOUNTER — Encounter: Payer: Self-pay | Admitting: Internal Medicine

## 2012-02-26 ENCOUNTER — Ambulatory Visit (INDEPENDENT_AMBULATORY_CARE_PROVIDER_SITE_OTHER): Payer: Medicare Other | Admitting: Internal Medicine

## 2012-02-26 VITALS — BP 134/82 | HR 76 | Temp 97.7°F | Wt 162.0 lb

## 2012-02-26 DIAGNOSIS — I129 Hypertensive chronic kidney disease with stage 1 through stage 4 chronic kidney disease, or unspecified chronic kidney disease: Secondary | ICD-10-CM | POA: Diagnosis not present

## 2012-02-26 DIAGNOSIS — R1312 Dysphagia, oropharyngeal phase: Secondary | ICD-10-CM | POA: Diagnosis not present

## 2012-02-26 DIAGNOSIS — N181 Chronic kidney disease, stage 1: Secondary | ICD-10-CM | POA: Diagnosis not present

## 2012-02-26 DIAGNOSIS — I10 Essential (primary) hypertension: Secondary | ICD-10-CM | POA: Diagnosis not present

## 2012-02-26 DIAGNOSIS — I69959 Hemiplegia and hemiparesis following unspecified cerebrovascular disease affecting unspecified side: Secondary | ICD-10-CM | POA: Diagnosis not present

## 2012-02-26 DIAGNOSIS — F0390 Unspecified dementia without behavioral disturbance: Secondary | ICD-10-CM | POA: Diagnosis not present

## 2012-02-26 DIAGNOSIS — N12 Tubulo-interstitial nephritis, not specified as acute or chronic: Secondary | ICD-10-CM | POA: Diagnosis not present

## 2012-02-26 LAB — BASIC METABOLIC PANEL
BUN: 16 mg/dL (ref 6–23)
Calcium: 9.7 mg/dL (ref 8.4–10.5)
GFR: 87.11 mL/min (ref >60.00)
Glucose, Bld: 136 mg/dL — ABNORMAL HIGH (ref 70–99)
Sodium: 139 mEq/L (ref 135–145)

## 2012-02-26 NOTE — Progress Notes (Signed)
Patient ID: Mike Williams, male   DOB: 09-Feb-1933, 78 y.o.   MRN: [id number] Recent pyelonephritis-folllowed by rehab center stay. Now at home and doing easonably well but his memoy is worse, he is occasionally incontinent of stool, doesn't like taking his meds but wife is persistent.   Past Medical History  Diagnosis Date  . CEREBROVASCULAR ACCIDENT, HX OF 02/09/2007  . DEMENTIA 05/12/2007  . HYPERGLYCEMIA 09/21/2009  . HYPERTENSION 12/29/2006  . OBSTRUCTIVE SLEEP APNEA 12/29/2006  . PROSTATE CANCER, HX OF 12/29/2006  . Hemorrhoids   . Lactose intolerance   . Risk for falls   . Prostatitis   . Aortic root dilatation     Noted on 09/2010 echo, with mild-mod AI    History   Social History  . Marital Status: Married    Spouse Name: N/A    Number of Children: N/A  . Years of Education: N/A   Occupational History  . Not on file.   Social History Main Topics  . Smoking status: Never Smoker   . Smokeless tobacco: Not on file  . Alcohol Use:   . Drug Use:   . Sexually Active:    Other Topics Concern  . Not on file   Social History Narrative  . No narrative on file    Past Surgical History  Procedure Date  . Cholecystectomy   . Prostate surgery     cryotherapy  . Ganglion cyst excision     Family History  Problem Relation Age of Onset  . Heart attack Father   . Heart disease Father   . Cancer Sister     No Known Allergies  Current Outpatient Prescriptions on File Prior to Visit  Medication Sig Dispense Refill  . aspirin 81 MG chewable tablet Chew 1 tablet (81 mg total) by mouth daily.  30 tablet  0  . clopidogrel (PLAVIX) 75 MG tablet Take 1 tablet (75 mg total) by mouth daily.  90 tablet  3  . darifenacin (ENABLEX) 7.5 MG 24 hr tablet Take 1 tablet (7.5 mg total) by mouth daily.  90 tablet  3  . escitalopram (LEXAPRO) 10 MG tablet Take 10 mg by mouth daily.      . felodipine (PLENDIL) 5 MG 24 hr tablet take 1 tablet by mouth once daily  30 tablet  5  . hydrALAZINE  (APRESOLINE) 25 MG tablet Take 2 tablets (50 mg total) by mouth 4 (four) times daily.  90 tablet  3  . levETIRAcetam (KEPPRA) 250 MG tablet Take 1 tablet (250 mg total) by mouth 2 (two) times daily.  180 tablet  3  . olmesartan (BENICAR) 20 MG tablet Take 1 tablet (20 mg total) by mouth daily.  30 tablet  0     patient denies chest pain, shortness of breath, orthopnea. Denies lower extremity edema, abdominal pain, change in appetite, change in bowel movements. Patient denies rashes, musculoskeletal complaints. No other specific complaints in a complete review of systems.   BP 134/82  Pulse 76  Temp 97.7 F (36.5 C) (Oral)  Wt 162 lb (73.483 kg) Confused well-nourished male in no acute distress. HEENT exam atraumatic, normocephalic, neck supple without jugular venous distention. Chest clear to auscultation cardiac exam S1-S2 are regular. Abdominal exam overweight with bowel sounds, soft and nontender. Extremities no edema. Neurologic exam is alert with a normal gait.

## 2012-02-26 NOTE — Assessment & Plan Note (Signed)
Fair control cotinue same meds (they have a bottle of benicar/hct 40/25-- he will NOT take that)

## 2012-02-26 NOTE — Assessment & Plan Note (Signed)
Recent pyelonephritis- sxs are now better He is off abx  Will monitor

## 2012-02-27 DIAGNOSIS — I69959 Hemiplegia and hemiparesis following unspecified cerebrovascular disease affecting unspecified side: Secondary | ICD-10-CM | POA: Diagnosis not present

## 2012-02-27 DIAGNOSIS — I129 Hypertensive chronic kidney disease with stage 1 through stage 4 chronic kidney disease, or unspecified chronic kidney disease: Secondary | ICD-10-CM | POA: Diagnosis not present

## 2012-02-27 DIAGNOSIS — R1312 Dysphagia, oropharyngeal phase: Secondary | ICD-10-CM | POA: Diagnosis not present

## 2012-02-27 DIAGNOSIS — F0390 Unspecified dementia without behavioral disturbance: Secondary | ICD-10-CM | POA: Diagnosis not present

## 2012-02-27 DIAGNOSIS — N181 Chronic kidney disease, stage 1: Secondary | ICD-10-CM | POA: Diagnosis not present

## 2012-03-01 DIAGNOSIS — R1312 Dysphagia, oropharyngeal phase: Secondary | ICD-10-CM | POA: Diagnosis not present

## 2012-03-01 DIAGNOSIS — I129 Hypertensive chronic kidney disease with stage 1 through stage 4 chronic kidney disease, or unspecified chronic kidney disease: Secondary | ICD-10-CM | POA: Diagnosis not present

## 2012-03-01 DIAGNOSIS — F0390 Unspecified dementia without behavioral disturbance: Secondary | ICD-10-CM | POA: Diagnosis not present

## 2012-03-01 DIAGNOSIS — N181 Chronic kidney disease, stage 1: Secondary | ICD-10-CM | POA: Diagnosis not present

## 2012-03-01 DIAGNOSIS — I69959 Hemiplegia and hemiparesis following unspecified cerebrovascular disease affecting unspecified side: Secondary | ICD-10-CM | POA: Diagnosis not present

## 2012-03-02 DIAGNOSIS — I69959 Hemiplegia and hemiparesis following unspecified cerebrovascular disease affecting unspecified side: Secondary | ICD-10-CM | POA: Diagnosis not present

## 2012-03-02 DIAGNOSIS — F0390 Unspecified dementia without behavioral disturbance: Secondary | ICD-10-CM | POA: Diagnosis not present

## 2012-03-02 DIAGNOSIS — I129 Hypertensive chronic kidney disease with stage 1 through stage 4 chronic kidney disease, or unspecified chronic kidney disease: Secondary | ICD-10-CM | POA: Diagnosis not present

## 2012-03-02 DIAGNOSIS — N181 Chronic kidney disease, stage 1: Secondary | ICD-10-CM | POA: Diagnosis not present

## 2012-03-02 DIAGNOSIS — R1312 Dysphagia, oropharyngeal phase: Secondary | ICD-10-CM | POA: Diagnosis not present

## 2012-03-03 DIAGNOSIS — R1312 Dysphagia, oropharyngeal phase: Secondary | ICD-10-CM | POA: Diagnosis not present

## 2012-03-03 DIAGNOSIS — F0390 Unspecified dementia without behavioral disturbance: Secondary | ICD-10-CM | POA: Diagnosis not present

## 2012-03-03 DIAGNOSIS — I69959 Hemiplegia and hemiparesis following unspecified cerebrovascular disease affecting unspecified side: Secondary | ICD-10-CM | POA: Diagnosis not present

## 2012-03-03 DIAGNOSIS — N181 Chronic kidney disease, stage 1: Secondary | ICD-10-CM | POA: Diagnosis not present

## 2012-03-03 DIAGNOSIS — I129 Hypertensive chronic kidney disease with stage 1 through stage 4 chronic kidney disease, or unspecified chronic kidney disease: Secondary | ICD-10-CM | POA: Diagnosis not present

## 2012-03-04 DIAGNOSIS — N181 Chronic kidney disease, stage 1: Secondary | ICD-10-CM | POA: Diagnosis not present

## 2012-03-04 DIAGNOSIS — F0390 Unspecified dementia without behavioral disturbance: Secondary | ICD-10-CM | POA: Diagnosis not present

## 2012-03-04 DIAGNOSIS — R1312 Dysphagia, oropharyngeal phase: Secondary | ICD-10-CM | POA: Diagnosis not present

## 2012-03-04 DIAGNOSIS — I129 Hypertensive chronic kidney disease with stage 1 through stage 4 chronic kidney disease, or unspecified chronic kidney disease: Secondary | ICD-10-CM | POA: Diagnosis not present

## 2012-03-04 DIAGNOSIS — I69959 Hemiplegia and hemiparesis following unspecified cerebrovascular disease affecting unspecified side: Secondary | ICD-10-CM | POA: Diagnosis not present

## 2012-03-08 DIAGNOSIS — N181 Chronic kidney disease, stage 1: Secondary | ICD-10-CM | POA: Diagnosis not present

## 2012-03-08 DIAGNOSIS — F0390 Unspecified dementia without behavioral disturbance: Secondary | ICD-10-CM | POA: Diagnosis not present

## 2012-03-08 DIAGNOSIS — I129 Hypertensive chronic kidney disease with stage 1 through stage 4 chronic kidney disease, or unspecified chronic kidney disease: Secondary | ICD-10-CM | POA: Diagnosis not present

## 2012-03-08 DIAGNOSIS — R1312 Dysphagia, oropharyngeal phase: Secondary | ICD-10-CM | POA: Diagnosis not present

## 2012-03-08 DIAGNOSIS — I69959 Hemiplegia and hemiparesis following unspecified cerebrovascular disease affecting unspecified side: Secondary | ICD-10-CM | POA: Diagnosis not present

## 2012-03-09 DIAGNOSIS — F0390 Unspecified dementia without behavioral disturbance: Secondary | ICD-10-CM | POA: Diagnosis not present

## 2012-03-09 DIAGNOSIS — R1312 Dysphagia, oropharyngeal phase: Secondary | ICD-10-CM | POA: Diagnosis not present

## 2012-03-09 DIAGNOSIS — N181 Chronic kidney disease, stage 1: Secondary | ICD-10-CM | POA: Diagnosis not present

## 2012-03-09 DIAGNOSIS — I129 Hypertensive chronic kidney disease with stage 1 through stage 4 chronic kidney disease, or unspecified chronic kidney disease: Secondary | ICD-10-CM | POA: Diagnosis not present

## 2012-03-09 DIAGNOSIS — I69959 Hemiplegia and hemiparesis following unspecified cerebrovascular disease affecting unspecified side: Secondary | ICD-10-CM | POA: Diagnosis not present

## 2012-03-10 DIAGNOSIS — N181 Chronic kidney disease, stage 1: Secondary | ICD-10-CM | POA: Diagnosis not present

## 2012-03-10 DIAGNOSIS — I69959 Hemiplegia and hemiparesis following unspecified cerebrovascular disease affecting unspecified side: Secondary | ICD-10-CM | POA: Diagnosis not present

## 2012-03-10 DIAGNOSIS — I129 Hypertensive chronic kidney disease with stage 1 through stage 4 chronic kidney disease, or unspecified chronic kidney disease: Secondary | ICD-10-CM | POA: Diagnosis not present

## 2012-03-10 DIAGNOSIS — R1312 Dysphagia, oropharyngeal phase: Secondary | ICD-10-CM | POA: Diagnosis not present

## 2012-03-10 DIAGNOSIS — F0390 Unspecified dementia without behavioral disturbance: Secondary | ICD-10-CM | POA: Diagnosis not present

## 2012-03-11 DIAGNOSIS — F0390 Unspecified dementia without behavioral disturbance: Secondary | ICD-10-CM | POA: Diagnosis not present

## 2012-03-11 DIAGNOSIS — N181 Chronic kidney disease, stage 1: Secondary | ICD-10-CM | POA: Diagnosis not present

## 2012-03-11 DIAGNOSIS — I129 Hypertensive chronic kidney disease with stage 1 through stage 4 chronic kidney disease, or unspecified chronic kidney disease: Secondary | ICD-10-CM | POA: Diagnosis not present

## 2012-03-11 DIAGNOSIS — I69959 Hemiplegia and hemiparesis following unspecified cerebrovascular disease affecting unspecified side: Secondary | ICD-10-CM | POA: Diagnosis not present

## 2012-03-11 DIAGNOSIS — R1312 Dysphagia, oropharyngeal phase: Secondary | ICD-10-CM | POA: Diagnosis not present

## 2012-03-15 DIAGNOSIS — R1312 Dysphagia, oropharyngeal phase: Secondary | ICD-10-CM | POA: Diagnosis not present

## 2012-03-15 DIAGNOSIS — F0390 Unspecified dementia without behavioral disturbance: Secondary | ICD-10-CM | POA: Diagnosis not present

## 2012-03-15 DIAGNOSIS — I129 Hypertensive chronic kidney disease with stage 1 through stage 4 chronic kidney disease, or unspecified chronic kidney disease: Secondary | ICD-10-CM | POA: Diagnosis not present

## 2012-03-15 DIAGNOSIS — I69959 Hemiplegia and hemiparesis following unspecified cerebrovascular disease affecting unspecified side: Secondary | ICD-10-CM | POA: Diagnosis not present

## 2012-03-15 DIAGNOSIS — N181 Chronic kidney disease, stage 1: Secondary | ICD-10-CM | POA: Diagnosis not present

## 2012-03-16 DIAGNOSIS — I129 Hypertensive chronic kidney disease with stage 1 through stage 4 chronic kidney disease, or unspecified chronic kidney disease: Secondary | ICD-10-CM | POA: Diagnosis not present

## 2012-03-16 DIAGNOSIS — I69959 Hemiplegia and hemiparesis following unspecified cerebrovascular disease affecting unspecified side: Secondary | ICD-10-CM | POA: Diagnosis not present

## 2012-03-16 DIAGNOSIS — R1312 Dysphagia, oropharyngeal phase: Secondary | ICD-10-CM | POA: Diagnosis not present

## 2012-03-16 DIAGNOSIS — F0390 Unspecified dementia without behavioral disturbance: Secondary | ICD-10-CM | POA: Diagnosis not present

## 2012-03-16 DIAGNOSIS — N181 Chronic kidney disease, stage 1: Secondary | ICD-10-CM | POA: Diagnosis not present

## 2012-03-17 ENCOUNTER — Other Ambulatory Visit (INDEPENDENT_AMBULATORY_CARE_PROVIDER_SITE_OTHER): Payer: Medicare Other

## 2012-03-17 DIAGNOSIS — I10 Essential (primary) hypertension: Secondary | ICD-10-CM | POA: Diagnosis not present

## 2012-03-17 LAB — BASIC METABOLIC PANEL
Calcium: 9.6 mg/dL (ref 8.4–10.5)
Creatinine, Ser: 1.1 mg/dL (ref 0.4–1.5)

## 2012-03-18 ENCOUNTER — Telehealth: Payer: Self-pay | Admitting: *Deleted

## 2012-03-18 DIAGNOSIS — F0390 Unspecified dementia without behavioral disturbance: Secondary | ICD-10-CM | POA: Diagnosis not present

## 2012-03-18 DIAGNOSIS — I69959 Hemiplegia and hemiparesis following unspecified cerebrovascular disease affecting unspecified side: Secondary | ICD-10-CM | POA: Diagnosis not present

## 2012-03-18 DIAGNOSIS — N181 Chronic kidney disease, stage 1: Secondary | ICD-10-CM | POA: Diagnosis not present

## 2012-03-18 DIAGNOSIS — R1312 Dysphagia, oropharyngeal phase: Secondary | ICD-10-CM | POA: Diagnosis not present

## 2012-03-18 DIAGNOSIS — I129 Hypertensive chronic kidney disease with stage 1 through stage 4 chronic kidney disease, or unspecified chronic kidney disease: Secondary | ICD-10-CM | POA: Diagnosis not present

## 2012-03-18 NOTE — Telephone Encounter (Signed)
Mike Williams, PT with [facility] would like a verbal order to extend PT/OT 2x wk for 4 wks.  Ok per Dr Cato Mulligan, verbal order given

## 2012-03-23 DIAGNOSIS — N181 Chronic kidney disease, stage 1: Secondary | ICD-10-CM | POA: Diagnosis not present

## 2012-03-23 DIAGNOSIS — I69959 Hemiplegia and hemiparesis following unspecified cerebrovascular disease affecting unspecified side: Secondary | ICD-10-CM | POA: Diagnosis not present

## 2012-03-23 DIAGNOSIS — R1312 Dysphagia, oropharyngeal phase: Secondary | ICD-10-CM | POA: Diagnosis not present

## 2012-03-23 DIAGNOSIS — I129 Hypertensive chronic kidney disease with stage 1 through stage 4 chronic kidney disease, or unspecified chronic kidney disease: Secondary | ICD-10-CM | POA: Diagnosis not present

## 2012-03-23 DIAGNOSIS — F0390 Unspecified dementia without behavioral disturbance: Secondary | ICD-10-CM | POA: Diagnosis not present

## 2012-03-24 DIAGNOSIS — Z8701 Personal history of pneumonia (recurrent): Secondary | ICD-10-CM

## 2012-03-24 DIAGNOSIS — R1312 Dysphagia, oropharyngeal phase: Secondary | ICD-10-CM

## 2012-03-24 DIAGNOSIS — F0390 Unspecified dementia without behavioral disturbance: Secondary | ICD-10-CM | POA: Diagnosis not present

## 2012-03-24 DIAGNOSIS — I129 Hypertensive chronic kidney disease with stage 1 through stage 4 chronic kidney disease, or unspecified chronic kidney disease: Secondary | ICD-10-CM

## 2012-03-24 DIAGNOSIS — I69959 Hemiplegia and hemiparesis following unspecified cerebrovascular disease affecting unspecified side: Secondary | ICD-10-CM

## 2012-03-24 DIAGNOSIS — N181 Chronic kidney disease, stage 1: Secondary | ICD-10-CM | POA: Diagnosis not present

## 2012-03-25 DIAGNOSIS — F0390 Unspecified dementia without behavioral disturbance: Secondary | ICD-10-CM | POA: Diagnosis not present

## 2012-03-25 DIAGNOSIS — N181 Chronic kidney disease, stage 1: Secondary | ICD-10-CM | POA: Diagnosis not present

## 2012-03-25 DIAGNOSIS — R1312 Dysphagia, oropharyngeal phase: Secondary | ICD-10-CM | POA: Diagnosis not present

## 2012-03-25 DIAGNOSIS — I69959 Hemiplegia and hemiparesis following unspecified cerebrovascular disease affecting unspecified side: Secondary | ICD-10-CM | POA: Diagnosis not present

## 2012-03-25 DIAGNOSIS — I129 Hypertensive chronic kidney disease with stage 1 through stage 4 chronic kidney disease, or unspecified chronic kidney disease: Secondary | ICD-10-CM | POA: Diagnosis not present

## 2012-03-26 DIAGNOSIS — N181 Chronic kidney disease, stage 1: Secondary | ICD-10-CM | POA: Diagnosis not present

## 2012-03-26 DIAGNOSIS — I129 Hypertensive chronic kidney disease with stage 1 through stage 4 chronic kidney disease, or unspecified chronic kidney disease: Secondary | ICD-10-CM | POA: Diagnosis not present

## 2012-03-26 DIAGNOSIS — F0390 Unspecified dementia without behavioral disturbance: Secondary | ICD-10-CM | POA: Diagnosis not present

## 2012-03-26 DIAGNOSIS — I69959 Hemiplegia and hemiparesis following unspecified cerebrovascular disease affecting unspecified side: Secondary | ICD-10-CM | POA: Diagnosis not present

## 2012-03-26 DIAGNOSIS — R1312 Dysphagia, oropharyngeal phase: Secondary | ICD-10-CM | POA: Diagnosis not present

## 2012-03-29 DIAGNOSIS — F0390 Unspecified dementia without behavioral disturbance: Secondary | ICD-10-CM | POA: Diagnosis not present

## 2012-03-29 DIAGNOSIS — I69959 Hemiplegia and hemiparesis following unspecified cerebrovascular disease affecting unspecified side: Secondary | ICD-10-CM | POA: Diagnosis not present

## 2012-03-29 DIAGNOSIS — N181 Chronic kidney disease, stage 1: Secondary | ICD-10-CM | POA: Diagnosis not present

## 2012-03-29 DIAGNOSIS — I129 Hypertensive chronic kidney disease with stage 1 through stage 4 chronic kidney disease, or unspecified chronic kidney disease: Secondary | ICD-10-CM | POA: Diagnosis not present

## 2012-03-29 DIAGNOSIS — R1312 Dysphagia, oropharyngeal phase: Secondary | ICD-10-CM | POA: Diagnosis not present

## 2012-03-30 ENCOUNTER — Other Ambulatory Visit: Payer: Self-pay | Admitting: Internal Medicine

## 2012-03-30 DIAGNOSIS — N181 Chronic kidney disease, stage 1: Secondary | ICD-10-CM | POA: Diagnosis not present

## 2012-03-30 DIAGNOSIS — R1312 Dysphagia, oropharyngeal phase: Secondary | ICD-10-CM | POA: Diagnosis not present

## 2012-03-30 DIAGNOSIS — F0390 Unspecified dementia without behavioral disturbance: Secondary | ICD-10-CM | POA: Diagnosis not present

## 2012-03-30 DIAGNOSIS — I69959 Hemiplegia and hemiparesis following unspecified cerebrovascular disease affecting unspecified side: Secondary | ICD-10-CM | POA: Diagnosis not present

## 2012-03-30 DIAGNOSIS — I129 Hypertensive chronic kidney disease with stage 1 through stage 4 chronic kidney disease, or unspecified chronic kidney disease: Secondary | ICD-10-CM | POA: Diagnosis not present

## 2012-03-31 ENCOUNTER — Telehealth: Payer: Self-pay | Admitting: Internal Medicine

## 2012-03-31 DIAGNOSIS — N181 Chronic kidney disease, stage 1: Secondary | ICD-10-CM | POA: Diagnosis not present

## 2012-03-31 DIAGNOSIS — I69959 Hemiplegia and hemiparesis following unspecified cerebrovascular disease affecting unspecified side: Secondary | ICD-10-CM | POA: Diagnosis not present

## 2012-03-31 DIAGNOSIS — R1312 Dysphagia, oropharyngeal phase: Secondary | ICD-10-CM | POA: Diagnosis not present

## 2012-03-31 DIAGNOSIS — I129 Hypertensive chronic kidney disease with stage 1 through stage 4 chronic kidney disease, or unspecified chronic kidney disease: Secondary | ICD-10-CM | POA: Diagnosis not present

## 2012-03-31 DIAGNOSIS — F0390 Unspecified dementia without behavioral disturbance: Secondary | ICD-10-CM | POA: Diagnosis not present

## 2012-03-31 NOTE — Telephone Encounter (Signed)
Ok per Dr Swords, verbal order given 

## 2012-03-31 NOTE — Telephone Encounter (Signed)
Mike Williams from [facility] is asking for 2 more nurse visits to complete care plan. Ok to leave message on cell.

## 2012-04-02 DIAGNOSIS — I69959 Hemiplegia and hemiparesis following unspecified cerebrovascular disease affecting unspecified side: Secondary | ICD-10-CM | POA: Diagnosis not present

## 2012-04-02 DIAGNOSIS — R1312 Dysphagia, oropharyngeal phase: Secondary | ICD-10-CM | POA: Diagnosis not present

## 2012-04-02 DIAGNOSIS — F0390 Unspecified dementia without behavioral disturbance: Secondary | ICD-10-CM | POA: Diagnosis not present

## 2012-04-02 DIAGNOSIS — N181 Chronic kidney disease, stage 1: Secondary | ICD-10-CM | POA: Diagnosis not present

## 2012-04-02 DIAGNOSIS — I129 Hypertensive chronic kidney disease with stage 1 through stage 4 chronic kidney disease, or unspecified chronic kidney disease: Secondary | ICD-10-CM | POA: Diagnosis not present

## 2012-04-06 DIAGNOSIS — I69959 Hemiplegia and hemiparesis following unspecified cerebrovascular disease affecting unspecified side: Secondary | ICD-10-CM | POA: Diagnosis not present

## 2012-04-06 DIAGNOSIS — N181 Chronic kidney disease, stage 1: Secondary | ICD-10-CM | POA: Diagnosis not present

## 2012-04-06 DIAGNOSIS — I129 Hypertensive chronic kidney disease with stage 1 through stage 4 chronic kidney disease, or unspecified chronic kidney disease: Secondary | ICD-10-CM | POA: Diagnosis not present

## 2012-04-06 DIAGNOSIS — F0390 Unspecified dementia without behavioral disturbance: Secondary | ICD-10-CM | POA: Diagnosis not present

## 2012-04-06 DIAGNOSIS — R1312 Dysphagia, oropharyngeal phase: Secondary | ICD-10-CM | POA: Diagnosis not present

## 2012-04-07 DIAGNOSIS — R1312 Dysphagia, oropharyngeal phase: Secondary | ICD-10-CM | POA: Diagnosis not present

## 2012-04-07 DIAGNOSIS — F0390 Unspecified dementia without behavioral disturbance: Secondary | ICD-10-CM | POA: Diagnosis not present

## 2012-04-07 DIAGNOSIS — I129 Hypertensive chronic kidney disease with stage 1 through stage 4 chronic kidney disease, or unspecified chronic kidney disease: Secondary | ICD-10-CM | POA: Diagnosis not present

## 2012-04-07 DIAGNOSIS — N181 Chronic kidney disease, stage 1: Secondary | ICD-10-CM | POA: Diagnosis not present

## 2012-04-07 DIAGNOSIS — I69959 Hemiplegia and hemiparesis following unspecified cerebrovascular disease affecting unspecified side: Secondary | ICD-10-CM | POA: Diagnosis not present

## 2012-04-08 DIAGNOSIS — N181 Chronic kidney disease, stage 1: Secondary | ICD-10-CM | POA: Diagnosis not present

## 2012-04-08 DIAGNOSIS — I69959 Hemiplegia and hemiparesis following unspecified cerebrovascular disease affecting unspecified side: Secondary | ICD-10-CM | POA: Diagnosis not present

## 2012-04-08 DIAGNOSIS — I129 Hypertensive chronic kidney disease with stage 1 through stage 4 chronic kidney disease, or unspecified chronic kidney disease: Secondary | ICD-10-CM | POA: Diagnosis not present

## 2012-04-08 DIAGNOSIS — F0390 Unspecified dementia without behavioral disturbance: Secondary | ICD-10-CM | POA: Diagnosis not present

## 2012-04-08 DIAGNOSIS — R1312 Dysphagia, oropharyngeal phase: Secondary | ICD-10-CM | POA: Diagnosis not present

## 2012-04-13 DIAGNOSIS — N181 Chronic kidney disease, stage 1: Secondary | ICD-10-CM | POA: Diagnosis not present

## 2012-04-13 DIAGNOSIS — F0390 Unspecified dementia without behavioral disturbance: Secondary | ICD-10-CM | POA: Diagnosis not present

## 2012-04-13 DIAGNOSIS — R1312 Dysphagia, oropharyngeal phase: Secondary | ICD-10-CM | POA: Diagnosis not present

## 2012-04-13 DIAGNOSIS — I129 Hypertensive chronic kidney disease with stage 1 through stage 4 chronic kidney disease, or unspecified chronic kidney disease: Secondary | ICD-10-CM | POA: Diagnosis not present

## 2012-04-13 DIAGNOSIS — I69959 Hemiplegia and hemiparesis following unspecified cerebrovascular disease affecting unspecified side: Secondary | ICD-10-CM | POA: Diagnosis not present

## 2012-04-14 DIAGNOSIS — N181 Chronic kidney disease, stage 1: Secondary | ICD-10-CM | POA: Diagnosis not present

## 2012-04-14 DIAGNOSIS — R1312 Dysphagia, oropharyngeal phase: Secondary | ICD-10-CM | POA: Diagnosis not present

## 2012-04-14 DIAGNOSIS — F0390 Unspecified dementia without behavioral disturbance: Secondary | ICD-10-CM | POA: Diagnosis not present

## 2012-04-14 DIAGNOSIS — I129 Hypertensive chronic kidney disease with stage 1 through stage 4 chronic kidney disease, or unspecified chronic kidney disease: Secondary | ICD-10-CM | POA: Diagnosis not present

## 2012-04-14 DIAGNOSIS — I69959 Hemiplegia and hemiparesis following unspecified cerebrovascular disease affecting unspecified side: Secondary | ICD-10-CM | POA: Diagnosis not present

## 2012-04-15 DIAGNOSIS — R1312 Dysphagia, oropharyngeal phase: Secondary | ICD-10-CM | POA: Diagnosis not present

## 2012-04-15 DIAGNOSIS — N181 Chronic kidney disease, stage 1: Secondary | ICD-10-CM | POA: Diagnosis not present

## 2012-04-15 DIAGNOSIS — I129 Hypertensive chronic kidney disease with stage 1 through stage 4 chronic kidney disease, or unspecified chronic kidney disease: Secondary | ICD-10-CM | POA: Diagnosis not present

## 2012-04-15 DIAGNOSIS — F0390 Unspecified dementia without behavioral disturbance: Secondary | ICD-10-CM | POA: Diagnosis not present

## 2012-04-15 DIAGNOSIS — I69959 Hemiplegia and hemiparesis following unspecified cerebrovascular disease affecting unspecified side: Secondary | ICD-10-CM | POA: Diagnosis not present

## 2012-04-16 ENCOUNTER — Ambulatory Visit (INDEPENDENT_AMBULATORY_CARE_PROVIDER_SITE_OTHER): Payer: Medicare Other | Admitting: Internal Medicine

## 2012-04-16 VITALS — BP 168/94 | HR 80 | Temp 98.0°F | Wt 168.0 lb

## 2012-04-16 DIAGNOSIS — I10 Essential (primary) hypertension: Secondary | ICD-10-CM

## 2012-04-16 DIAGNOSIS — R7309 Other abnormal glucose: Secondary | ICD-10-CM

## 2012-04-16 DIAGNOSIS — N181 Chronic kidney disease, stage 1: Secondary | ICD-10-CM | POA: Diagnosis not present

## 2012-04-16 NOTE — Progress Notes (Signed)
Patient ID: Mike Williams, male   DOB: [birth year], 78 y.o.   MRN: [id number] Dementia- progressive  HTN-- tolerating meds  Fall risk-- rehab is going well. He is able to do many more activities after rehab  Past Medical History  Diagnosis Date  . CEREBROVASCULAR ACCIDENT, HX OF 02/09/2007  . DEMENTIA 05/12/2007  . HYPERGLYCEMIA 09/21/2009  . HYPERTENSION 12/29/2006  . OBSTRUCTIVE SLEEP APNEA 12/29/2006  . PROSTATE CANCER, HX OF 12/29/2006  . Hemorrhoids   . Lactose intolerance   . Risk for falls   . Prostatitis   . Aortic root dilatation     Noted on 09/2010 echo, with mild-mod AI    History   Social History  . Marital Status: Married    Spouse Name: N/A    Number of Children: N/A  . Years of Education: N/A   Occupational History  . Not on file.   Social History Main Topics  . Smoking status: Never Smoker   . Smokeless tobacco: Not on file  . Alcohol Use:   . Drug Use:   . Sexually Active:    Other Topics Concern  . Not on file   Social History Narrative  . No narrative on file    Past Surgical History  Procedure Date  . Cholecystectomy   . Prostate surgery     cryotherapy  . Ganglion cyst excision     Family History  Problem Relation Age of Onset  . Heart attack Father   . Heart disease Father   . Cancer Sister     No Known Allergies  Current Outpatient Prescriptions on File Prior to Visit  Medication Sig Dispense Refill  . aspirin 81 MG chewable tablet Chew 1 tablet (81 mg total) by mouth daily.  30 tablet  0  . BENICAR 20 MG tablet take 1 tablet by mouth once daily  30 each  5  . clopidogrel (PLAVIX) 75 MG tablet Take 1 tablet (75 mg total) by mouth daily.  90 tablet  3  . darifenacin (ENABLEX) 7.5 MG 24 hr tablet Take 1 tablet (7.5 mg total) by mouth daily.  90 tablet  3  . felodipine (PLENDIL) 5 MG 24 hr tablet take 1 tablet by mouth once daily  30 tablet  5  . hydrALAZINE (APRESOLINE) 25 MG tablet Take 25 mg by mouth 3 (three) times daily.         patient denies chest pain, shortness of breath, orthopnea. Denies lower extremity edema, abdominal pain, change in appetite, change in bowel movements. Patient denies rashes, musculoskeletal complaints. No other specific complaints in a complete review of systems.   BP 168/94  Pulse 80  Temp 98 F (36.7 C) (Oral)  Wt 168 lb (76.204 kg)  well-developed well-nourished male in no acute distress. HEENT exam atraumatic, normocephalic, neck supple without jugular venous distention. Chest clear to auscultation cardiac exam S1-S2 are regular. Abdominal exam overweight with bowel sounds, soft and nontender.

## 2012-04-16 NOTE — Assessment & Plan Note (Signed)
BP Readings from Last 3 Encounters:  04/16/12 168/94  02/26/12 134/82  01/16/12 171/86   i've asked him to monitor bp at home Goal bp < 135/85

## 2012-04-16 NOTE — Assessment & Plan Note (Addendum)
No need for further evaluation

## 2012-04-16 NOTE — Assessment & Plan Note (Signed)
Lab Results  Component Value Date   CREATININE 1.1 03/17/2012   Adequate control

## 2012-05-01 ENCOUNTER — Other Ambulatory Visit: Payer: Self-pay | Admitting: Internal Medicine

## 2012-05-24 ENCOUNTER — Ambulatory Visit: Payer: Medicare Other | Admitting: Internal Medicine

## 2012-05-27 ENCOUNTER — Ambulatory Visit: Payer: Medicare Other | Admitting: Internal Medicine

## 2012-06-03 ENCOUNTER — Telehealth: Payer: Self-pay | Admitting: Internal Medicine

## 2012-06-03 NOTE — Telephone Encounter (Signed)
Pt's wife would like you to call. She needs some info about his BP for the [place]. Would appreciate call by Friday. Thanks.

## 2012-06-24 ENCOUNTER — Other Ambulatory Visit: Payer: Self-pay | Admitting: Internal Medicine

## 2012-07-14 ENCOUNTER — Ambulatory Visit (INDEPENDENT_AMBULATORY_CARE_PROVIDER_SITE_OTHER): Payer: Medicare Other | Admitting: Internal Medicine

## 2012-07-14 VITALS — BP 145/90 | HR 84 | Temp 98.2°F | Wt 178.0 lb

## 2012-07-14 DIAGNOSIS — F068 Other specified mental disorders due to known physiological condition: Secondary | ICD-10-CM

## 2012-07-14 NOTE — Progress Notes (Signed)
Dementia-- sxs are similar  Stroke-- has some right sided facial weakness and right sided leg weakness.  He is unable to understand use of cane.   Sleep- just fine  Wife provides constant care.   Past Medical History  Diagnosis Date  . CEREBROVASCULAR ACCIDENT, HX OF 02/09/2007  . DEMENTIA 05/12/2007  . HYPERGLYCEMIA 09/21/2009  . HYPERTENSION 12/29/2006  . OBSTRUCTIVE SLEEP APNEA 12/29/2006  . PROSTATE CANCER, HX OF 12/29/2006  . Hemorrhoids   . Lactose intolerance   . Risk for falls   . Prostatitis   . Aortic root dilatation     Noted on 09/2010 echo, with mild-mod AI    History   Social History  . Marital Status: Married    Spouse Name: N/A    Number of Children: N/A  . Years of Education: N/A   Occupational History  . Not on file.   Social History Main Topics  . Smoking status: Never Smoker   . Smokeless tobacco: Not on file  . Alcohol Use:   . Drug Use:   . Sexually Active:    Other Topics Concern  . Not on file   Social History Narrative  . No narrative on file    Past Surgical History  Procedure Laterality Date  . Cholecystectomy    . Prostate surgery      cryotherapy  . Ganglion cyst excision      Family History  Problem Relation Age of Onset  . Heart attack Father   . Heart disease Father   . Cancer Sister     No Known Allergies  Current Outpatient Prescriptions on File Prior to Visit  Medication Sig Dispense Refill  . BENICAR 20 MG tablet take 1 tablet by mouth once daily  30 each  5  . clopidogrel (PLAVIX) 75 MG tablet Take 1 tablet (75 mg total) by mouth daily.  90 tablet  3  . darifenacin (ENABLEX) 7.5 MG 24 hr tablet Take 1 tablet (7.5 mg total) by mouth daily.  90 tablet  3  . felodipine (PLENDIL) 5 MG 24 hr tablet take 1 tablet by mouth once daily  30 tablet  5  . hydrALAZINE (APRESOLINE) 50 MG tablet take 1 tablet by mouth four times a day  120 tablet  1  . levETIRAcetam (KEPPRA) 250 MG tablet Take 250 mg by mouth every 12 (twelve)  hours.       No current facility-administered medications on file prior to visit.     patient denies chest pain, shortness of breath, orthopnea. Denies lower extremity edema, abdominal pain, change in appetite, change in bowel movements. Patient denies rashes, musculoskeletal complaints. No other specific complaints in a complete review of systems.   BP 164/92  Pulse 84  Temp(Src) 98.2 F (36.8 C) (Oral)  Wt 178 lb (80.74 kg)  BMI 27.87 kg/m2  Confused male Right sided facial droop. Unable to participate in a mental or neuro exam.

## 2012-07-15 NOTE — Assessment & Plan Note (Signed)
He has progressive dementia. I have filled out a form for him to participate in an adult daycare setting. I don't think the right-sided weakness of the face is any different than it has been. No further evaluation necessary. We'll continue current medications.

## 2012-07-16 ENCOUNTER — Ambulatory Visit: Payer: Medicare Other | Admitting: Internal Medicine

## 2012-07-22 ENCOUNTER — Telehealth: Payer: Self-pay | Admitting: Internal Medicine

## 2012-07-22 NOTE — Telephone Encounter (Signed)
Patient's wife called stating that they were to receive an rx for a walker with a seat and did not get it before leaving the office. They state you can mail it along with other documents to be mailed. Please assist.

## 2012-07-26 NOTE — Telephone Encounter (Signed)
Rx mailed to home address.  Patient waiting on a letter?

## 2012-07-26 NOTE — Telephone Encounter (Signed)
Fleet Contras can you write this on a rx and mail to pt?

## 2012-08-25 ENCOUNTER — Other Ambulatory Visit: Payer: Self-pay | Admitting: Internal Medicine

## 2012-09-20 ENCOUNTER — Other Ambulatory Visit: Payer: Self-pay | Admitting: Internal Medicine

## 2012-10-22 ENCOUNTER — Other Ambulatory Visit: Payer: Self-pay | Admitting: Internal Medicine

## 2012-10-25 ENCOUNTER — Other Ambulatory Visit: Payer: Self-pay | Admitting: Internal Medicine

## 2012-11-22 ENCOUNTER — Other Ambulatory Visit: Payer: Self-pay | Admitting: Internal Medicine

## 2012-12-09 DIAGNOSIS — C61 Malignant neoplasm of prostate: Secondary | ICD-10-CM | POA: Diagnosis not present

## 2012-12-09 DIAGNOSIS — N3941 Urge incontinence: Secondary | ICD-10-CM | POA: Diagnosis not present

## 2012-12-20 ENCOUNTER — Other Ambulatory Visit: Payer: Self-pay | Admitting: Internal Medicine

## 2013-01-01 ENCOUNTER — Other Ambulatory Visit: Payer: Self-pay | Admitting: Internal Medicine

## 2013-01-14 ENCOUNTER — Ambulatory Visit: Payer: Medicare Other | Admitting: Internal Medicine

## 2013-02-01 ENCOUNTER — Encounter: Payer: Self-pay | Admitting: Internal Medicine

## 2013-02-01 ENCOUNTER — Ambulatory Visit (INDEPENDENT_AMBULATORY_CARE_PROVIDER_SITE_OTHER): Payer: Medicare Other | Admitting: Internal Medicine

## 2013-02-01 VITALS — BP 152/92 | HR 88 | Temp 98.0°F | Ht 67.0 in | Wt 160.0 lb

## 2013-02-01 DIAGNOSIS — N2889 Other specified disorders of kidney and ureter: Secondary | ICD-10-CM

## 2013-02-01 DIAGNOSIS — I10 Essential (primary) hypertension: Secondary | ICD-10-CM

## 2013-02-01 DIAGNOSIS — Z23 Encounter for immunization: Secondary | ICD-10-CM

## 2013-02-01 DIAGNOSIS — N181 Chronic kidney disease, stage 1: Secondary | ICD-10-CM

## 2013-02-01 DIAGNOSIS — F068 Other specified mental disorders due to known physiological condition: Secondary | ICD-10-CM

## 2013-02-01 DIAGNOSIS — R569 Unspecified convulsions: Secondary | ICD-10-CM

## 2013-02-01 DIAGNOSIS — R7309 Other abnormal glucose: Secondary | ICD-10-CM

## 2013-02-01 LAB — CBC WITH DIFFERENTIAL/PLATELET
Basophils Relative: 0.3 % (ref 0.0–3.0)
Eosinophils Absolute: 0.2 10*3/uL (ref 0.0–0.7)
Hemoglobin: 14.4 g/dL (ref 13.0–17.0)
Lymphocytes Relative: 28.6 % (ref 12.0–46.0)
MCHC: 33.2 g/dL (ref 30.0–36.0)
MCV: 86.2 fl (ref 78.0–100.0)
Neutro Abs: 3.8 10*3/uL (ref 1.4–7.7)
RBC: 5.02 Mil/uL (ref 4.22–5.81)

## 2013-02-01 LAB — BASIC METABOLIC PANEL
GFR: 89.83 mL/min (ref >60.00)
Potassium: 3.8 mEq/L (ref 3.5–5.1)
Sodium: 142 mEq/L (ref 135–145)

## 2013-02-01 LAB — HEMOGLOBIN A1C: Hgb A1c MFr Bld: 5.8 % (ref 4.6–6.5)

## 2013-02-01 LAB — HEPATIC FUNCTION PANEL
AST: 19 U/L (ref 0–37)
Alkaline Phosphatase: 84 U/L (ref 39–117)
Bilirubin, Direct: 0 mg/dL (ref 0.0–0.3)
Total Bilirubin: 0.5 mg/dL (ref 0.3–1.2)

## 2013-02-01 LAB — LIPID PANEL
HDL: 53.3 mg/dL (ref >39.00)
Total CHOL/HDL Ratio: 3

## 2013-02-01 NOTE — Progress Notes (Signed)
Dementia- he is sleeping a lot. Able to do ADLs- dressing, bathroom activities, no falls (he has a walker).  htn- tolerating meds  Seizure- no recurrence on keppra  Reviewed pmh, pxh, meds   patient denies chest pain, shortness of breath, orthopnea. Denies lower extremity edema, abdominal pain, change in appetite, change in bowel movements. Patient denies rashes, musculoskeletal complaints. No other specific complaints in a complete review of systems.   Reviewed vitals  well-developed well-nourished male in no acute distress. HEENT exam atraumatic, normocephalic, neck supple without jugular venous distention. Chest clear to auscultation cardiac exam S1-S2 are regular. Abdominal exam overweight with bowel sounds, soft and nontender. Extremities no edema. Neurologic exam is alert with a normal gait.

## 2013-02-01 NOTE — Assessment & Plan Note (Signed)
Will check labs

## 2013-02-01 NOTE — Assessment & Plan Note (Signed)
Check labs today.

## 2013-02-01 NOTE — Assessment & Plan Note (Signed)
Repeated and better Continue same meds

## 2013-02-01 NOTE — Assessment & Plan Note (Signed)
Progressive disease  Has 24 hour care with wife

## 2013-02-01 NOTE — Assessment & Plan Note (Signed)
No recurrence. 

## 2013-02-02 ENCOUNTER — Other Ambulatory Visit: Payer: Self-pay | Admitting: *Deleted

## 2013-02-02 ENCOUNTER — Other Ambulatory Visit: Payer: Self-pay | Admitting: Internal Medicine

## 2013-02-21 ENCOUNTER — Other Ambulatory Visit: Payer: Self-pay | Admitting: Internal Medicine

## 2013-03-03 ENCOUNTER — Other Ambulatory Visit: Payer: Self-pay | Admitting: Internal Medicine

## 2013-03-22 ENCOUNTER — Other Ambulatory Visit: Payer: Self-pay | Admitting: Internal Medicine

## 2013-04-04 ENCOUNTER — Other Ambulatory Visit: Payer: Self-pay | Admitting: Internal Medicine

## 2013-05-07 ENCOUNTER — Other Ambulatory Visit: Payer: Self-pay | Admitting: Internal Medicine

## 2013-05-09 ENCOUNTER — Telehealth: Payer: Self-pay | Admitting: Internal Medicine

## 2013-05-09 NOTE — Telephone Encounter (Signed)
Requesting refill of hydrALAZINE (APRESOLINE) 50 MG tablet sent to [facility]

## 2013-05-10 NOTE — Telephone Encounter (Signed)
This was done yesterday.  

## 2013-05-12 ENCOUNTER — Encounter (INDEPENDENT_AMBULATORY_CARE_PROVIDER_SITE_OTHER): Payer: Self-pay

## 2013-05-12 ENCOUNTER — Ambulatory Visit (INDEPENDENT_AMBULATORY_CARE_PROVIDER_SITE_OTHER): Payer: Medicare Other | Admitting: Neurology

## 2013-05-12 ENCOUNTER — Encounter: Payer: Self-pay | Admitting: Neurology

## 2013-05-12 VITALS — BP 158/86 | HR 100 | Wt 168.0 lb

## 2013-05-12 DIAGNOSIS — R569 Unspecified convulsions: Secondary | ICD-10-CM

## 2013-05-12 DIAGNOSIS — F068 Other specified mental disorders due to known physiological condition: Secondary | ICD-10-CM

## 2013-05-12 NOTE — Patient Instructions (Signed)
Epilepsy A seizure (convulsion) is a sudden change in brain function that causes a change in behavior, muscle activity, or ability to remain awake and alert. If a person has recurring seizures, this is called epilepsy. CAUSES  Epilepsy is a disorder with many possible causes. Anything that disturbs the normal pattern of brain cell activity can lead to seizures. Seizure can be caused from illness to brain damage to abnormal brain development. Epilepsy may develop because of:  An abnormality in brain wiring.  An imbalance of nerve signaling chemicals (neurotransmitters).  Some combination of these factors. Scientists are learning an increasing amount about genetic causes of seizures. SYMPTOMS  The symptoms of a seizure can vary greatly from one person to another. These may include:  An aura, or warning that tells a person they are about to have a seizure.  Abnormal sensations, such as abnormal smell or seeing flashing lights.  Sudden, general body stiffness.  Rhythmic jerking of the face, arm, or leg  on one or both sides.  Sudden change in consciousness.  The person may appear to be awake but not responding.  They may appear to be asleep but cannot be awakened.  Grimacing, chewing, lip smacking, or drooling.  Often there is a period of sleepiness after a seizure. DIAGNOSIS  The description you give to your caregiver about what you experienced will help them understand your problems. Equally important is the description by any witnesses to your seizure. A physical exam, including a detailed neurological exam, is necessary. An EEG (electroencephalogram) is a painless test of your brain waves. In this test a diagram is created of your brain waves. These diagrams can be interpreted by a specialist. Pictures of your brain are usually taken with:  An MRI.  A CT scan. Lab tests may be done to look for:  Signs of infection.  Abnormal blood chemistry. PREVENTION  There is no way to  prevent the development of epilepsy. If you have seizures that are typically triggered by an event (such as flashing lights), try to avoid the trigger. This can help you avoid a seizure.  PROGNOSIS  Most people with epilepsy lead outwardly normal lives. While epilepsy cannot currently be cured, for some people it does eventually go away. Most seizures do not cause brain damage. It is not uncommon for people with epilepsy, especially children, to develop behavioral and emotional problems. These problems are sometimes the consequence of medicine for seizures or social stress. For some people with epilepsy, the risk of seizures restricts their independence and recreational activities. For example, some states refuse drivers licenses to people with epilepsy. Most women with epilepsy can become pregnant. They should discuss their epilepsy and the medicine they are taking with their caregivers. Women with epilepsy have a 86 percent or better chance of having a normal, healthy baby. RISKS AND COMPLICATIONS  People with epilepsy are at increased risk of falls, accidents, and injuries. People with epilepsy are at special risk for two life-threatening conditions. These are status epilepticus and sudden unexplained death (extremely rare). Status epilepticus is a long lasting, continuous seizure that is a medical emergency. TREATMENT  Once epilepsy is diagnosed, it is important to begin treatment as soon as possible. For about 80 percent of those diagnosed with epilepsy, seizures can be controlled with modern medicines and surgical techniques. Some antiepileptic drugs can interfere with the effectiveness of oral contraceptives. In 1997, the FDA approved a pacemaker for the brain the (vagus nerve stimulator). This stimulator can be used for  people with seizures that are not well-controlled by medicine. Studies have shown that in some cases, children may experience fewer seizures if they maintain a strict diet. The strict  diet is called the ketogenic diet. This diet is rich in fats and low in carbohydrates. HOME CARE INSTRUCTIONS   Your caregiver will make recommendations about driving and safety in normal activities. Follow these carefully.  Take any medicine prescribed exactly as directed.  Do any blood tests requested to monitor the levels of your medicine.  The people you live and work with should know that you are prone to seizures. They should receive instructions on how to help you. In general, a witness to a seizure should:  Cushion your head and body.  Turn you on your side.  Avoid unnecessarily restraining you.  Not place anything inside your mouth.  Call for local emergency medical help if there is any question about what has occurred.  Keep a seizure diary. Record what you recall about any seizure, especially any possible trigger.  If your caregiver has given you a follow-up appointment, it is very important to keep that appointment. Not keeping the appointment could result in permanent injury and disability. If there is any problem keeping the appointment, you must call back to this facility for assistance. SEEK MEDICAL CARE IF:   You develop signs of infection or other illness. This might increase the risk of a seizure.  You seem to be having more frequent seizures.  Your seizure pattern is changing. SEEK IMMEDIATE MEDICAL CARE IF:   A seizure does not stop after a few moments.  A seizure causes any difficulty in breathing.  A seizure results in a very severe headache.  A seizure leaves you with the inability to speak or use a part of your body. MAKE SURE YOU:   Understand these instructions.  Will watch your condition.  Will get help right away if you are not doing well or get worse. Document Released: 04/21/2005 Document Revised: 07/14/2011 Document Reviewed: 12/01/2012 [facility] Patient Information 2014 [place].

## 2013-05-12 NOTE — Progress Notes (Signed)
Reason for visit: Dementia  Mike Williams is an 80 y.o. male  History of present illness:  Mike Williams is a 80 year old right-handed black male with a history of a progressive dementing illness, cerebrovascular disease, and a history of seizures. The patient has been on low-dose Keppra, and he has been tolerating the medication well without recurrence of seizures. The patient was last seen through this office on 07/17/2011. The patient has continued to have a decline in his cognitive status, and he now has a pseudobulbar affect, abulia, and dysnomia along with a pseudobulbar affect. The patient will watch TV during the day, and he continues to be interested in sports. The patient otherwise will not initiate any activities. The patient sleeps well at night, and he will nap during the day. The patient does have a risk for wandering, and the wife has to lock the doors. The patient requires 24/7 supervision. The patient requires assistance with all activities of daily living with exception of feeding. The patient is eating well. The patient will stumble on occasion, with no falls. The patient has a cane to use, but he does not consistently use this assistive device. The patient returns for an evaluation. The patient is not on medications for dementia. No significant problems with agitation are noted.  Past Medical History  Diagnosis Date  . CEREBROVASCULAR ACCIDENT, HX OF 02/09/2007  . DEMENTIA 05/12/2007  . HYPERGLYCEMIA 09/21/2009  . HYPERTENSION 12/29/2006  . OBSTRUCTIVE SLEEP APNEA 12/29/2006  . PROSTATE CANCER, HX OF 12/29/2006  . Hemorrhoids   . Lactose intolerance   . Risk for falls   . Prostatitis   . Aortic root dilatation     Noted on 09/2010 echo, with mild-mod AI  . Seizures     Past Surgical History  Procedure Laterality Date  . Cholecystectomy    . Prostate surgery      cryotherapy  . Ganglion cyst excision      Family History  Problem Relation Age of Onset  . Heart attack  Father   . Heart disease Father   . Cancer Sister     Social history:  reports that he has never smoked. He has never used smokeless tobacco. He reports that he does not drink alcohol or use illicit drugs.   No Known Allergies  Medications:  Current Outpatient Prescriptions on File Prior to Visit  Medication Sig Dispense Refill  . aspirin 81 MG tablet Take 81 mg by mouth daily.      Marland Kitchen BENICAR 20 MG tablet take 1 tablet by mouth once daily  30 tablet  5  . clopidogrel (PLAVIX) 75 MG tablet take 1 tablet by mouth once daily  90 tablet  3  . darifenacin (ENABLEX) 7.5 MG 24 hr tablet Take 1 tablet (7.5 mg total) by mouth daily.  90 tablet  3  . felodipine (PLENDIL) 5 MG 24 hr tablet take 1 tablet by mouth once daily  30 tablet  5  . hydrALAZINE (APRESOLINE) 50 MG tablet take 1 tablet by mouth four times a day  120 tablet  1  . levETIRAcetam (KEPPRA) 250 MG tablet take 1 tablet by mouth twice a day  60 tablet  5   No current facility-administered medications on file prior to visit.    ROS:  Out of a complete 14 system review of symptoms, the patient complains only of the following symptoms, and all other reviewed systems are negative.  Leg swelling Daytime sleepiness Walking difficulty Memory loss, seizures,  speech problems, facial drooping  Blood pressure 158/86, pulse 100, weight 168 lb (76.204 kg).  Physical Exam  General: The patient is alert and cooperative at the time of the examination.  Skin: No significant peripheral edema is noted.   Neurologic Exam  Mental status: The Mini-Mental status examination done today shows a total score of 6/30.  Cranial nerves: Facial symmetry is not present. There is a depression in the right nasolabial fold. Speech is normal, no aphasia or dysarthria is noted. The patient is minimally verbal. Extraocular movements are full. Visual fields are full. Some masking of the face is seen.  Motor: The patient has good strength in all 4  extremities.  Sensory examination: Soft touch sensation is symmetric on the face, arms, and legs.  Coordination: The patient has good finger-nose-finger and heel-to-shin bilaterally. The patient has significant apraxia with the use of the extremities.  Gait and station: The patient has a slightly wide-based gait. The patient is able walk independently. Tandem gait was not tested. Romberg is negative. No drift is seen.  Reflexes: Deep tendon reflexes are symmetric.   Assessment/Plan:  1. Progressive dementia  2. Cerebrovascular disease  3. History seizures  The patient is doing well with the seizures, but the dementia continues to progress. The patient has abulia, apraxia on examination, and dysnomia. The patient will continue the [place] for now. He will followup in one year. The wife will contact our office if any concerns arise.  Jill Alexanders MD 05/12/2013 7:14 PM  [facility] [address] [place], [place] [address]  Phone [phone] Fax [phone]

## 2013-06-03 ENCOUNTER — Other Ambulatory Visit: Payer: Self-pay | Admitting: Internal Medicine

## 2013-07-10 ENCOUNTER — Other Ambulatory Visit: Payer: Self-pay | Admitting: Internal Medicine

## 2013-07-19 ENCOUNTER — Ambulatory Visit: Payer: Medicare Other | Admitting: Internal Medicine

## 2013-07-26 ENCOUNTER — Ambulatory Visit: Payer: Medicare Other | Admitting: Internal Medicine

## 2013-08-08 ENCOUNTER — Telehealth: Payer: Self-pay | Admitting: Neurology

## 2013-08-08 ENCOUNTER — Encounter: Payer: Self-pay | Admitting: Neurology

## 2013-08-08 NOTE — Telephone Encounter (Signed)
Wife calling regarding needing a letter written for the [place].  She would like a call back from a nurse as soon as possible detailing what she needs. Please call.

## 2013-08-08 NOTE — Telephone Encounter (Signed)
I called the patient, talked with the wife. The wife is applying for assistance through the [facility]. She needs a letter regarding the disabilities that this patient has. I have written a letter. The wife will pick up the letter.

## 2013-08-08 NOTE — Telephone Encounter (Signed)
Spoke with [name](POA), is faxing a letter of what she is needing for the [place]

## 2013-08-09 ENCOUNTER — Telehealth: Payer: Self-pay | Admitting: *Deleted

## 2013-08-09 NOTE — Telephone Encounter (Signed)
Spoke with patient wife let her know letter has been written and is ready for pick-up.

## 2013-08-30 ENCOUNTER — Telehealth: Payer: Self-pay | Admitting: Internal Medicine

## 2013-08-30 NOTE — Telephone Encounter (Signed)
Pt's wife is calling in regards to [place] paperwork for the pt. States she dropped it off about 3 weeks ago and is checking to see if it is ready for pick up. [facility].

## 2013-09-01 NOTE — Telephone Encounter (Signed)
pts wife stated she dropped off paperwork.  I don't know where it is and its not in log book.  Pt's wife wants me to call her tomorrow at 10 09/02/13 and she will let me know if she has another copy

## 2013-09-08 ENCOUNTER — Other Ambulatory Visit: Payer: Self-pay | Admitting: Internal Medicine

## 2013-09-18 ENCOUNTER — Other Ambulatory Visit: Payer: Self-pay | Admitting: Internal Medicine

## 2013-09-24 ENCOUNTER — Other Ambulatory Visit: Payer: Self-pay | Admitting: Internal Medicine

## 2013-09-27 ENCOUNTER — Encounter: Payer: Self-pay | Admitting: Internal Medicine

## 2013-09-27 ENCOUNTER — Ambulatory Visit (INDEPENDENT_AMBULATORY_CARE_PROVIDER_SITE_OTHER): Payer: Medicare Other | Admitting: Internal Medicine

## 2013-09-27 VITALS — BP 140/86 | HR 80 | Temp 98.0°F | Ht 67.0 in | Wt 164.0 lb

## 2013-09-27 DIAGNOSIS — Z66 Do not resuscitate: Secondary | ICD-10-CM

## 2013-09-27 DIAGNOSIS — F068 Other specified mental disorders due to known physiological condition: Secondary | ICD-10-CM

## 2013-09-27 MED ORDER — CLOPIDOGREL BISULFATE 75 MG PO TABS: ORAL_TABLET | ORAL | Status: DC

## 2013-09-27 MED ORDER — FELODIPINE ER 5 MG PO TB24: ORAL_TABLET | ORAL | Status: DC

## 2013-09-27 MED ORDER — OLMESARTAN MEDOXOMIL 20 MG PO TABS: ORAL_TABLET | ORAL | Status: DC

## 2013-09-27 MED ORDER — HYDRALAZINE HCL 50 MG PO TABS: ORAL_TABLET | ORAL | Status: DC

## 2013-09-27 MED ORDER — DARIFENACIN HYDROBROMIDE ER 7.5 MG PO TB24: 7.5000 mg | ORAL_TABLET | Freq: Every day | ORAL | Status: DC

## 2013-09-27 MED ORDER — HYDRALAZINE HCL 50 MG PO TABS: 50.0000 mg | ORAL_TABLET | Freq: Three times a day (TID) | ORAL | Status: DC

## 2013-09-27 NOTE — Progress Notes (Signed)
Memory- continues to decline. Patient can feed himself He cannot: shower/bathe Brush teeth Dress himself Wears depends Use the telephone Does not speak in sentences He needs 24/7 supervision Take his own medications  He sleeps most of the time. Wife is primary care giver  Patient has been a paitent of mine for >> 10 years . There has been a dramatic change in his mental abilities in that time frame- As stated above he can really not do anything on his own.   Other documented medical problems include: Patient Active Problem List   Diagnosis Date Noted  .  09/27/2013  . Seizures 02/01/2013  . Aortic insufficiency 07/04/2011  . Chronic renal insufficiency, stage I 10/11/2010  . HYPERGLYCEMIA 09/21/2009  . DEMENTIA 05/12/2007  . CEREBROVASCULAR ACCIDENT, HX OF 02/09/2007  . OBSTRUCTIVE SLEEP APNEA 12/29/2006  . HYPERTENSION 12/29/2006  . PROSTATE CANCER, HX OF 12/29/2006   Past Medical History  Diagnosis Date  . CEREBROVASCULAR ACCIDENT, HX OF 02/09/2007  . DEMENTIA 05/12/2007  . HYPERGLYCEMIA 09/21/2009  . HYPERTENSION 12/29/2006  . OBSTRUCTIVE SLEEP APNEA 12/29/2006  . PROSTATE CANCER, HX OF 12/29/2006  . Hemorrhoids   . Lactose intolerance   . Risk for falls   . Prostatitis   . Aortic root dilatation     Noted on 09/2010 echo, with mild-mod AI  . Seizures     History   Social History  . Marital Status: Married    Spouse Name: N/A    Number of Children: N/A  . Years of Education: N/A   Occupational History  . Not on file.   Social History Main Topics  . Smoking status: Never Smoker   . Smokeless tobacco: Never Used  . Alcohol Use: No  . Drug Use: No  . Sexual Activity: Not on file   Other Topics Concern  . Not on file   Social History Narrative  . No narrative on file    Past Surgical History  Procedure Laterality Date  . Cholecystectomy    . Prostate surgery      cryotherapy  . Ganglion cyst excision      Family History  Problem Relation Age of  Onset  . Heart attack Father   . Heart disease Father   . Cancer Sister     No Known Allergies  Current Outpatient Prescriptions on File Prior to Visit  Medication Sig Dispense Refill  . aspirin 81 MG tablet Take 81 mg by mouth daily.      Marland Kitchen levETIRAcetam (KEPPRA) 250 MG tablet take 1 tablet by mouth twice a day  60 tablet  5  . [DISCONTINUED] darifenacin (ENABLEX) 7.5 MG 24 hr tablet Take 1 tablet (7.5 mg total) by mouth daily.  90 tablet  3   No current facility-administered medications on file prior to visit.     patient denies chest pain, shortness of breath, orthopnea. Denies lower extremity edema, abdominal pain, change in appetite, change in bowel movements. Patient denies rashes, musculoskeletal complaints. No other specific complaints in a complete review of systems.   BP 140/86  Pulse 80  Temp(Src) 98 F (36.7 C) (Oral)  Ht 5\' 7"  (1.702 m)  Wt 164 lb (74.39 kg)  BMI 25.68 kg/m2  well-developed well-nourished male in no acute distress. HEENT exam atraumatic, normocephalic, neck supple without jugular venous distention. Chest clear to auscultation cardiac exam S1-S2 are regular. Abdominal exam overweight with bowel sounds, soft and nontender. Extremities no edema. Neurologic exam: He is walking with a cane. His  gait is broad-based. He answers questions with one-word answers. He otherwise is noncommunicative. He does seem pleasant.  Do not resuscitate Discussed with wife at length Out of hospital arrest form completed   DEMENTIA His dementia continues to progress. He is unable to care for himself. He does very few of his own activities of daily living. I think he will need more and more assistance in the future. His wife is essentially providing 24 7 care at this time.

## 2013-09-27 NOTE — Progress Notes (Signed)
Pre visit review using our clinic review tool, if applicable. No additional management support is needed unless otherwise documented below in the visit note. 

## 2013-09-27 NOTE — Assessment & Plan Note (Signed)
Discussed with wife at length Out of hospital arrest form completed

## 2013-09-30 NOTE — Assessment & Plan Note (Signed)
His dementia continues to progress. He is unable to care for himself. He does very few of his own activities of daily living. I think he will need more and more assistance in the future. His wife is essentially providing 24 7 care at this time.

## 2013-12-12 ENCOUNTER — Other Ambulatory Visit: Payer: Self-pay | Admitting: Internal Medicine

## 2014-02-03 ENCOUNTER — Emergency Department (HOSPITAL_COMMUNITY): Payer: Medicare Other

## 2014-02-03 ENCOUNTER — Encounter (HOSPITAL_COMMUNITY): Payer: Self-pay | Admitting: Emergency Medicine

## 2014-02-03 ENCOUNTER — Emergency Department (HOSPITAL_COMMUNITY)
Admission: EM | Admit: 2014-02-03 | Discharge: 2014-02-03 | Disposition: A | Discharge status: Home / Self Care | Payer: Medicare Other | Attending: Emergency Medicine | Admitting: Emergency Medicine

## 2014-02-03 DIAGNOSIS — Z79899 Other long term (current) drug therapy: Secondary | ICD-10-CM | POA: Diagnosis not present

## 2014-02-03 DIAGNOSIS — G40909 Epilepsy, unspecified, not intractable, without status epilepticus: Secondary | ICD-10-CM | POA: Diagnosis not present

## 2014-02-03 DIAGNOSIS — R29810 Facial weakness: Secondary | ICD-10-CM | POA: Insufficient documentation

## 2014-02-03 DIAGNOSIS — I10 Essential (primary) hypertension: Secondary | ICD-10-CM | POA: Diagnosis not present

## 2014-02-03 DIAGNOSIS — Z8673 Personal history of transient ischemic attack (TIA), and cerebral infarction without residual deficits: Secondary | ICD-10-CM | POA: Diagnosis not present

## 2014-02-03 DIAGNOSIS — Z9181 History of falling: Secondary | ICD-10-CM | POA: Diagnosis not present

## 2014-02-03 DIAGNOSIS — R4182 Altered mental status, unspecified: Secondary | ICD-10-CM | POA: Diagnosis present

## 2014-02-03 DIAGNOSIS — F0390 Unspecified dementia without behavioral disturbance: Secondary | ICD-10-CM | POA: Diagnosis not present

## 2014-02-03 DIAGNOSIS — Z7902 Long term (current) use of antithrombotics/antiplatelets: Secondary | ICD-10-CM | POA: Diagnosis not present

## 2014-02-03 DIAGNOSIS — Z8546 Personal history of malignant neoplasm of prostate: Secondary | ICD-10-CM | POA: Insufficient documentation

## 2014-02-03 DIAGNOSIS — Z7982 Long term (current) use of aspirin: Secondary | ICD-10-CM | POA: Insufficient documentation

## 2014-02-03 DIAGNOSIS — Z8669 Personal history of other diseases of the nervous system and sense organs: Secondary | ICD-10-CM | POA: Insufficient documentation

## 2014-02-03 DIAGNOSIS — N3001 Acute cystitis with hematuria: Secondary | ICD-10-CM

## 2014-02-03 DIAGNOSIS — R40244 Other coma, without documented Glasgow coma scale score, or with partial score reported: Secondary | ICD-10-CM | POA: Diagnosis not present

## 2014-02-03 LAB — COMPREHENSIVE METABOLIC PANEL
ALT: 11 U/L (ref 0–53)
AST: 24 U/L (ref 0–37)
Albumin: 3.9 g/dL (ref 3.5–5.2)
Alkaline Phosphatase: 102 U/L (ref 39–117)
Anion gap: 13 (ref 5–15)
BUN: 13 mg/dL (ref 6–23)
CALCIUM: 9.5 mg/dL (ref 8.4–10.5)
CO2: 26 meq/L (ref 19–32)
CREATININE: 0.94 mg/dL (ref 0.50–1.35)
Chloride: 99 mEq/L (ref 96–112)
GFR, EST NON AFRICAN AMERICAN: 78 mL/min — AB (ref >90)
GLUCOSE: 121 mg/dL — AB (ref 70–99)
Potassium: 4.2 mEq/L (ref 3.7–5.3)
Sodium: 138 mEq/L (ref 137–147)
Total Bilirubin: 0.7 mg/dL (ref 0.3–1.2)
Total Protein: 8.7 g/dL — ABNORMAL HIGH (ref 6.0–8.3)

## 2014-02-03 LAB — CBC
HCT: 42.7 % (ref 39.0–52.0)
HEMOGLOBIN: 15.1 g/dL (ref 13.0–17.0)
MCH: 29.7 pg (ref 26.0–34.0)
MCHC: 35.4 g/dL (ref 30.0–36.0)
MCV: 84.1 fL (ref 78.0–100.0)
Platelets: 219 10*3/uL (ref 150–400)
RBC: 5.08 MIL/uL (ref 4.22–5.81)
RDW: 13.9 % (ref 11.5–15.5)
WBC: 10.1 10*3/uL (ref 4.0–10.5)

## 2014-02-03 LAB — URINE MICROSCOPIC-ADD ON

## 2014-02-03 LAB — URINALYSIS, ROUTINE W REFLEX MICROSCOPIC
Bilirubin Urine: NEGATIVE
Glucose, UA: NEGATIVE mg/dL
Ketones, ur: 15 mg/dL — AB
Nitrite: NEGATIVE
PROTEIN: NEGATIVE mg/dL
Specific Gravity, Urine: 1.011 (ref 1.005–1.030)
Urobilinogen, UA: 1 mg/dL (ref 0.0–1.0)
pH: 7.5 (ref 5.0–8.0)

## 2014-02-03 LAB — PROTIME-INR
INR: 0.99 (ref 0.00–1.49)
PROTHROMBIN TIME: 13.1 s (ref 11.6–15.2)

## 2014-02-03 LAB — CBG MONITORING, ED: Glucose-Capillary: 125 mg/dL — ABNORMAL HIGH (ref 70–99)

## 2014-02-03 MED ORDER — CEFTRIAXONE SODIUM 1 G IJ SOLR
1.0000 g | Freq: Once | INTRAMUSCULAR | Status: AC
  Administered 2014-02-03: 1 g via INTRAVENOUS
  Filled 2014-02-03: qty 10

## 2014-02-03 MED ORDER — CEPHALEXIN 500 MG PO CAPS: 500.0000 mg | ORAL_CAPSULE | Freq: Three times a day (TID) | ORAL | Status: DC

## 2014-02-03 NOTE — ED Notes (Signed)
Note pt takes oral meds with applesauce per spouse

## 2014-02-03 NOTE — Discharge Instructions (Signed)

## 2014-02-03 NOTE — ED Provider Notes (Signed)
CSN: [id number]     Arrival date & time 02/03/14  1130 History   First MD Initiated Contact with Patient 02/03/14 1144     Chief Complaint  Patient presents with  . Altered Mental Status     (Consider location/radiation/quality/duration/timing/severity/associated sxs/prior Treatment) HPI Comments: 80 yo male with a history of dementia presenting with altered mental status. History limited secondary to dementia and minimal verbal responses at baseline. Level V caveat applies.  His wife provided additional history. She stated that he had urinary incontinence in the bed this morning.  Later, she was preparing some hot chocolate, and he did not respond to her as readily as he normally does.  She found him to be diaphoretic. Now, she reports that he is at his mental status baseline.  Patient is a 80 y.o. male presenting with altered mental status.  Altered Mental Status Presenting symptoms: behavior changes and confusion   Severity:  Moderate Most recent episode:  Today Episode history:  Single Timing:  Constant Progression:  Resolved Chronicity:  Recurrent (similar symptoms when he had a UTI in the past) Associated symptoms: fever   Associated symptoms comment:  Urinary incontinence   Past Medical History  Diagnosis Date  . CEREBROVASCULAR ACCIDENT, HX OF 02/09/2007  . DEMENTIA 05/12/2007  . HYPERGLYCEMIA 09/21/2009  . HYPERTENSION 12/29/2006  . OBSTRUCTIVE SLEEP APNEA 12/29/2006  . PROSTATE CANCER, HX OF 12/29/2006  . Hemorrhoids   . Lactose intolerance   . Risk for falls   . Prostatitis   . Aortic root dilatation     Noted on 09/2010 echo, with mild-mod AI  . Seizures    Past Surgical History  Procedure Laterality Date  . Cholecystectomy    . Prostate surgery      cryotherapy  . Ganglion cyst excision     Family History  Problem Relation Age of Onset  . Heart attack Father   . Heart disease Father   . Cancer Sister    History  Substance Use Topics  . Smoking status:  Never Smoker   . Smokeless tobacco: Never Used  . Alcohol Use: No    Review of Systems  Unable to perform ROS: Dementia  Constitutional: Positive for fever.  Psychiatric/Behavioral: Positive for confusion.      Allergies  Review of patient's allergies indicates no known allergies.  Home Medications   Prior to Admission medications   Medication Sig Start Date End Date Taking? Authorizing Provider  aspirin 81 MG chewable tablet Chew 81 mg by mouth daily.   Yes Historical Provider, MD  clopidogrel (PLAVIX) 75 MG tablet Take 75 mg by mouth daily.   Yes Historical Provider, MD  darifenacin (ENABLEX) 7.5 MG 24 hr tablet Take 1 tablet (7.5 mg total) by mouth daily. 09/27/13  Yes Lisabeth Pick, MD  felodipine (PLENDIL) 5 MG 24 hr tablet Take 5 mg by mouth daily.   Yes Historical Provider, MD  hydrALAZINE (APRESOLINE) 50 MG tablet Take 50 mg by mouth 4 (four) times daily.   Yes Historical Provider, MD  levETIRAcetam (KEPPRA) 250 MG tablet Take 250 mg by mouth 2 (two) times daily.   Yes Historical Provider, MD  Multiple Vitamin (MULTIVITAMIN WITH MINERALS) TABS tablet Take 1 tablet by mouth daily.   Yes Historical Provider, MD  olmesartan (BENICAR) 20 MG tablet Take 20 mg by mouth daily.   Yes Historical Provider, MD  polyethylene glycol (MIRALAX / GLYCOLAX) packet Take 17 g by mouth daily.   Yes Historical Provider, MD  BP 159/87  Pulse 69  Temp(Src) 98.1 F (36.7 C) (Oral)  Resp 22  Ht 5\' 8"  (1.727 m)  Wt 160 lb (72.576 kg)  BMI 24.33 kg/m2  SpO2 98% Physical Exam  Nursing note and vitals reviewed. Constitutional: He is oriented to person, place, and time. He appears well-developed and well-nourished. No distress.  HENT:  Head: Normocephalic and atraumatic.  Mouth/Throat: Oropharynx is clear and moist.  Eyes: Conjunctivae are normal. Pupils are equal, round, and reactive to light. No scleral icterus.  Neck: Neck supple.  Cardiovascular: Normal rate, regular rhythm, normal  heart sounds and intact distal pulses.   No murmur heard. Pulmonary/Chest: Effort normal and breath sounds normal. No stridor. No respiratory distress. He has no wheezes. He has no rales.  Abdominal: Soft. He exhibits no distension. There is no tenderness.  Musculoskeletal: Normal range of motion. He exhibits no edema.  Neurological: He is alert and oriented to person, place, and time.  Right sided facial droop Shuffling gait  Skin: Skin is warm and dry. No rash noted.  Psychiatric: He has a normal mood and affect. His behavior is normal.    ED Course  Procedures (including critical care time) Labs Review Labs Reviewed  COMPREHENSIVE METABOLIC PANEL - Abnormal; Notable for the following:    Glucose, Bld 121 (*)    Total Protein 8.7 (*)    GFR calc non Af Amer 78 (*)    All other components within normal limits  URINALYSIS, ROUTINE W REFLEX MICROSCOPIC - Abnormal; Notable for the following:    APPearance CLOUDY (*)    Hgb urine dipstick MODERATE (*)    Ketones, ur 15 (*)    Leukocytes, UA LARGE (*)    All other components within normal limits  URINE MICROSCOPIC-ADD ON - Abnormal; Notable for the following:    Squamous Epithelial / LPF FEW (*)    Bacteria, UA MANY (*)    All other components within normal limits  CBG MONITORING, ED - Abnormal; Notable for the following:    Glucose-Capillary 125 (*)    All other components within normal limits  URINE CULTURE  CBC  PROTIME-INR    Imaging Review Ct Head Wo Contrast  02/03/2014   CLINICAL DATA:  Right facial droop. Altered mental status. History of dementia and CVA. Prostate cancer.  EXAM: CT HEAD WITHOUT CONTRAST  TECHNIQUE: Contiguous axial images were obtained from the base of the skull through the vertex without intravenous contrast.  COMPARISON:  CT 12/31/2011.  FINDINGS: No intra-axial or extra-axial pathologic fluid or blood collections. Diffuse cerebral atrophy. Periventricular and subcortical white matter ischemic change.  Old left parietal occipital and temporal infarcts. Small mucous retention cyst right frontal sinus. Mild mucosal thickening maxillary sinuses. Mastoids are clear. No acute bony abnormality.  IMPRESSION: Diffuse cerebral atrophy and chronic white matter and cortical ischemic change. No acute abnormality. Exam is stable from prior CT of 12/31/2011.   Electronically Signed   By: Marcello Moores  Register   On: 02/03/2014 12:56  All radiology studies independently viewed by me.      EKG Interpretation   Date/Time:  Friday February 03 2014 11:38:24 EDT Ventricular Rate:  79 PR Interval:  199 QRS Duration: 94 QT Interval:  333 QTC Calculation: 382 R Axis:   20 Text Interpretation:  Sinus rhythm Low voltage, extremity leads compared  to prior, nonspecific t wave changes not as prominent Confirmed by [facility]   MD, TREY ([id number]) on 02/03/2014 4:07:05 PM      MDM  Final diagnoses:  Acute cystitis with hematuria    80 year old male with a brief period of decreased responsiveness and diaphoresis. His wife thinks this was secondary to confusion from a urinary tract infection and fever. She reports that he is at his normal mental status now. He denies any seizure-like activity. He was ED workup is consistent with a urinary tract infection. He is not septic appearing or by criteria.  As he is at his baseline, able to get up and ambulate, and has caretakers at home, I think he is appropriate for outpatient treatment. Given dose of ceftriaxone in the plan to discharge with Keflex.    Artis Delay, MD 02/03/14 [id number]

## 2014-02-03 NOTE — ED Notes (Signed)
Previous hx cva with rt side weakness facial droop for 1 week and had decreased responsiveness to questions at 0930 family on way to er for addl info

## 2014-02-03 NOTE — ED Notes (Signed)
Iv attemptedx 1 by Daiva Huge then iv attempt x2 by writer without success iv therapist called

## 2014-02-06 LAB — URINE CULTURE

## 2014-02-07 ENCOUNTER — Telehealth (HOSPITAL_BASED_OUTPATIENT_CLINIC_OR_DEPARTMENT_OTHER): Payer: Self-pay | Admitting: Emergency Medicine

## 2014-02-07 NOTE — Telephone Encounter (Signed)
Post ED Visit - Positive Culture Follow-up  Culture report reviewed by antimicrobial stewardship pharmacist: []  Wes Sanford, Pharm.D., BCPS [x]  Heide Guile, Pharm.D., BCPS []  Alycia Rossetti, Pharm.D., BCPS []  Winslow, Pharm.D., BCPS, AAHIVP []  Legrand Como, Pharm.D., BCPS, AAHIVP []  Carly Sabat, Pharm.D. []  Elenor Quinones, Pharm.D.  Positive urine culture Klebsiella Treated with Cephalexin, organism sensitive to the same and no further patient follow-up is required at this time.  Hazle Nordmann 02/07/2014, 11:14 AM

## 2014-02-14 ENCOUNTER — Telehealth: Payer: Self-pay | Admitting: Internal Medicine

## 2014-02-14 ENCOUNTER — Ambulatory Visit (INDEPENDENT_AMBULATORY_CARE_PROVIDER_SITE_OTHER): Payer: Medicare Other | Admitting: Family Medicine

## 2014-02-14 ENCOUNTER — Encounter: Payer: Self-pay | Admitting: Family Medicine

## 2014-02-14 VITALS — BP 136/80 | HR 76 | Temp 98.0°F | Wt 162.0 lb

## 2014-02-14 DIAGNOSIS — Z23 Encounter for immunization: Secondary | ICD-10-CM

## 2014-02-14 DIAGNOSIS — G309 Alzheimer's disease, unspecified: Secondary | ICD-10-CM

## 2014-02-14 DIAGNOSIS — N3000 Acute cystitis without hematuria: Secondary | ICD-10-CM

## 2014-02-14 DIAGNOSIS — F0280 Dementia in other diseases classified elsewhere without behavioral disturbance: Secondary | ICD-10-CM

## 2014-02-14 DIAGNOSIS — I10 Essential (primary) hypertension: Secondary | ICD-10-CM

## 2014-02-14 MED ORDER — FELODIPINE ER 5 MG PO TB24
5.0000 mg | ORAL_TABLET | Freq: Every day | ORAL | Status: DC
Start: 2014-02-14 — End: 2014-06-29

## 2014-02-14 MED ORDER — CLOPIDOGREL BISULFATE 75 MG PO TABS: 75.0000 mg | ORAL_TABLET | Freq: Every day | ORAL | Status: DC

## 2014-02-14 NOTE — Progress Notes (Signed)
   Subjective:    Patient ID: Mike Williams, male    DOB: January 10, 1933, 80 y.o.   MRN: [id number]  HPI ER followup. Patient has advanced dementia.  He presented to ER on 02/03/2014 with altered metal status ,decreased responsiveness and urinary incontinence. He was also diaphoretic. Catheterized urine suggested UTI. He was placed on Keflex and culture did grow Klebsiella sensitive to cefazolin. He is back to baseline this time. He had CT of head which showed chronic changes but no acute findings. ER notes all reviewed.  Wife struggles to get him to drink adequate fluids. His appetite is fair. He has hypertension on multidrug regimen. Remote history of prostate cancer.  Past Medical History  Diagnosis Date  . CEREBROVASCULAR ACCIDENT, HX OF 02/09/2007  . DEMENTIA 05/12/2007  . HYPERGLYCEMIA 09/21/2009  . HYPERTENSION 12/29/2006  . OBSTRUCTIVE SLEEP APNEA 12/29/2006  . PROSTATE CANCER, HX OF 12/29/2006  . Hemorrhoids   . Lactose intolerance   . Risk for falls   . Prostatitis   . Aortic root dilatation     Noted on 09/2010 echo, with mild-mod AI  . Seizures    Past Surgical History  Procedure Laterality Date  . Cholecystectomy    . Prostate surgery      cryotherapy  . Ganglion cyst excision      reports that he has never smoked. He has never used smokeless tobacco. He reports that he does not drink alcohol or use illicit drugs. family history includes Cancer in his sister; Heart attack in his father; Heart disease in his father. No Known Allergies    Review of Systems Not obtainable from patient because of advanced dementia. Per wife, no nausea, vomiting, fever.    Objective:   Physical Exam  Constitutional: He appears well-developed and well-nourished.  Cardiovascular: Normal rate and regular rhythm.   Pulmonary/Chest: Effort normal and breath sounds normal. No respiratory distress. He has no wheezes. He has no rales.  Neurological: He is alert.          Assessment & Plan:  #1  recent UTI with Klebsiella pneumonia. We decided not to get repeat urine today since this would be very difficult with his advanced dementia. He is asymptomatic and was treated with full ten-day course of Keflex which should cover well #2 advanced dementia. He was on Exelon patch and wife has not seen improvement. We did not suspect any medication will be effective at this advanced stage of his dementia #3 health maintenance. Flu vaccine given. We are out of Prevnar 13 he needs that. Wife will check on coverage for shingles vaccine. #4 hypertension which is stable

## 2014-02-14 NOTE — Patient Instructions (Signed)
Check on coverage for Shingles vaccine. We will call you regarding Prevnar vaccine.

## 2014-02-14 NOTE — Telephone Encounter (Signed)
Cletus Gash is going to drop the paperwork off.  He doesn't need appt

## 2014-02-14 NOTE — Telephone Encounter (Signed)
Pt's va rep called to let us know the pt;s wife will make an appt. They need documentation that pt is disabled in order to get his VA benefits started. They have denied benefits for him ans pt has had 15 mini strokes and has to have 24 hr care. They are needing info and rep will have a form for him to fill out/ Pt is supposed to est w/ hunter 2/25.  No one has seen pt except dr Elease Hashimoto and that was today Pls advise if dr swords will be willing to help.  Can also call wife.

## 2014-02-14 NOTE — Progress Notes (Signed)
Pre visit review using our clinic review tool, if applicable. No additional management support is needed unless otherwise documented below in the visit note. 

## 2014-03-22 ENCOUNTER — Encounter: Payer: Self-pay | Admitting: Neurology

## 2014-03-28 ENCOUNTER — Encounter: Payer: Self-pay | Admitting: Neurology

## 2014-05-16 ENCOUNTER — Ambulatory Visit: Payer: Medicare Other | Admitting: Nurse Practitioner

## 2014-05-17 ENCOUNTER — Ambulatory Visit: Payer: Medicare Other | Admitting: Neurology

## 2014-05-31 ENCOUNTER — Ambulatory Visit: Payer: Self-pay | Admitting: Neurology

## 2014-06-11 ENCOUNTER — Other Ambulatory Visit: Payer: Self-pay | Admitting: Internal Medicine

## 2014-06-13 DIAGNOSIS — R310 Gross hematuria: Secondary | ICD-10-CM | POA: Diagnosis not present

## 2014-06-13 DIAGNOSIS — C61 Malignant neoplasm of prostate: Secondary | ICD-10-CM | POA: Diagnosis not present

## 2014-06-22 DIAGNOSIS — N200 Calculus of kidney: Secondary | ICD-10-CM | POA: Diagnosis not present

## 2014-06-22 DIAGNOSIS — R310 Gross hematuria: Secondary | ICD-10-CM | POA: Diagnosis not present

## 2014-06-22 DIAGNOSIS — N368 Other specified disorders of urethra: Secondary | ICD-10-CM | POA: Diagnosis not present

## 2014-06-22 DIAGNOSIS — N281 Cyst of kidney, acquired: Secondary | ICD-10-CM | POA: Diagnosis not present

## 2014-06-29 ENCOUNTER — Ambulatory Visit (INDEPENDENT_AMBULATORY_CARE_PROVIDER_SITE_OTHER): Payer: Medicare Other | Admitting: Family Medicine

## 2014-06-29 ENCOUNTER — Encounter: Payer: Self-pay | Admitting: Family Medicine

## 2014-06-29 VITALS — BP 122/80 | Temp 98.0°F | Wt 155.0 lb

## 2014-06-29 DIAGNOSIS — F0280 Dementia in other diseases classified elsewhere without behavioral disturbance: Secondary | ICD-10-CM

## 2014-06-29 DIAGNOSIS — R569 Unspecified convulsions: Secondary | ICD-10-CM | POA: Diagnosis not present

## 2014-06-29 DIAGNOSIS — Z8673 Personal history of transient ischemic attack (TIA), and cerebral infarction without residual deficits: Secondary | ICD-10-CM

## 2014-06-29 DIAGNOSIS — N3281 Overactive bladder: Secondary | ICD-10-CM | POA: Insufficient documentation

## 2014-06-29 DIAGNOSIS — G309 Alzheimer's disease, unspecified: Secondary | ICD-10-CM

## 2014-06-29 DIAGNOSIS — I10 Essential (primary) hypertension: Secondary | ICD-10-CM | POA: Diagnosis not present

## 2014-06-29 MED ORDER — DARIFENACIN HYDROBROMIDE ER 7.5 MG PO TB24: 7.5000 mg | ORAL_TABLET | Freq: Every day | ORAL | Status: DC

## 2014-06-29 MED ORDER — LEVETIRACETAM 250 MG PO TABS: 250.0000 mg | ORAL_TABLET | Freq: Two times a day (BID) | ORAL | Status: DC

## 2014-06-29 MED ORDER — OLMESARTAN MEDOXOMIL 20 MG PO TABS: 20.0000 mg | ORAL_TABLET | Freq: Every day | ORAL | Status: DC

## 2014-06-29 MED ORDER — HYDRALAZINE HCL 50 MG PO TABS: 50.0000 mg | ORAL_TABLET | Freq: Three times a day (TID) | ORAL | Status: DC

## 2014-06-29 MED ORDER — FELODIPINE ER 5 MG PO TB24: 5.0000 mg | ORAL_TABLET | Freq: Every day | ORAL | Status: DC

## 2014-06-29 NOTE — Progress Notes (Signed)
Mike Reddish, MD Phone: [phone]  Subjective:  Patient presents today to establish care with me as their new primary care provider. Patient was formerly a patient of Dr. Leanne Chang. Chief complaint-noted.   Alzheimer's Dementia -Memory- continues to decline. Patient can feed himself still.  He cannot: shower/bathe, Brush teeth,Dress himself, toilet, Use the telephone, Does not speak in sentences, take meds. He needs 24/7 supervision. Had been in [place] previously.   ROS- no trouble swallowing, able to walk with walker  Seizures- no recent recurrence. On keppra BID. ROS- no shaking or rhythmic movements  Hypertension-controlled  BP Readings from Last 3 Encounters:  06/29/14 122/80  02/14/14 136/80  02/03/14 173/94   Home BP monitoring-no Compliant with medications-yes without known side effects ROS-Denies any CP, HA, SOB, blurry vision, LE edema. Technically level 5 caveat applies for all ROS but this is report from wife.   History cva on aspirin and plavix. No recent extremity weakness or slurred speech (does not speak in sentences regardless)     The following were reviewed and entered/updated in epic: Past Medical History  Diagnosis Date  . CEREBROVASCULAR ACCIDENT, HX OF 02/09/2007  . DEMENTIA 05/12/2007  . HYPERGLYCEMIA 09/21/2009  . HYPERTENSION 12/29/2006  . OBSTRUCTIVE SLEEP APNEA 12/29/2006  . PROSTATE CANCER, HX OF 12/29/2006  . Hemorrhoids   . Lactose intolerance   . Risk for falls   . Prostatitis   . Aortic root dilatation     Noted on 09/2010 echo, with mild-mod AI  . Seizures    Patient Active Problem List   Diagnosis Date Noted  . Do not resuscitate 09/27/2013    Priority: High  . Seizures 02/01/2013    Priority: High  . Alzheimer's dementia 05/12/2007    Priority: High  . Aortic insufficiency 07/04/2011    Priority: Medium  . History of CVA (cerebrovascular accident) 02/09/2007    Priority: Medium  . Essential hypertension 12/29/2006    Priority:  Medium  . Overactive bladder 06/29/2014    Priority: Low  . Chronic renal insufficiency, stage I 10/11/2010    Priority: Low  . Hyperglycemia 09/21/2009    Priority: Low  . Obstructive sleep apnea 12/29/2006    Priority: Low  . PROSTATE CANCER, HX OF 12/29/2006    Priority: Low   Past Surgical History  Procedure Laterality Date  . Cholecystectomy    . Prostate surgery      cryotherapy  . Ganglion cyst excision      Family History  Problem Relation Age of Onset  . Heart attack Father   . Heart disease Father   . Cancer Sister    Medications- reviewed and updated Current Outpatient Prescriptions  Medication Sig Dispense Refill  . aspirin 81 MG chewable tablet Chew 81 mg by mouth daily.    . clopidogrel (PLAVIX) 75 MG tablet Take 1 tablet (75 mg total) by mouth daily. 90 tablet 1  . darifenacin (ENABLEX) 7.5 MG 24 hr tablet Take 1 tablet (7.5 mg total) by mouth daily. 90 tablet 3  . felodipine (PLENDIL) 5 MG 24 hr tablet Take 1 tablet (5 mg total) by mouth daily. 90 tablet 1  . hydrALAZINE (APRESOLINE) 50 MG tablet Take 50 mg by mouth 4 (four) times daily.    Marland Kitchen levETIRAcetam (KEPPRA) 250 MG tablet take 1 tablet by mouth twice a day 60 tablet 0  . Multiple Vitamin (MULTIVITAMIN WITH MINERALS) TABS tablet Take 1 tablet by mouth daily.    Marland Kitchen olmesartan (BENICAR) 20 MG tablet Take  20 mg by mouth daily.    . polyethylene glycol (MIRALAX / GLYCOLAX) packet Take 17 g by mouth daily.     Allergies-reviewed and updated No Known Allergies  History   Social History  . Marital Status: Married    Spouse Name: N/A  . Number of Children: N/A  . Years of Education: N/A   Social History Main Topics  . Smoking status: Never Smoker   . Smokeless tobacco: Never Used  . Alcohol Use: No  . Drug Use: No  . Sexual Activity: Not on file   Other Topics Concern  . Not on file   Social History Narrative   Married 52 years in 2016 to wife [name] (goes to [place] urgent care). 2 grown  sons. 2 grandkids-boy and girl.       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     ROS--See HPI   Objective: BP 122/80 mmHg  Temp(Src) 98 F (36.7 C)  Wt 155 lb (70.308 kg) Gen: NAD, resting comfortably HEENT: Mucous membranes are moist. Oropharynx normal Neck: no thyromegaly CV: RRR no murmurs rubs or gallops Lungs: CTAB no crackles, wheeze, rhonchi Abdomen: soft/nontender/nondistended/normal bowel sounds. No rebound or guarding.  Ext: no edema Skin: warm, dry Neuro: grossly normal, moves all extremities, PERRLA  Assessment/Plan:  Alzheimer's dementia Advanced. No longer on medications. Discussed immunizations/bloodwork likely very low yield 06/29/14. Symptoms started around 2007. Discussed average life expectancy 10 years. He can pretty much only fed himself at this point and otherwise completely dependent. i encouraged wife to consider PACE again as used in the past primarily for her benefit-she certainly needs more support as only has help twice a week now.    Seizures Controlled.  continue keppra BID. Neuro follow up 4 months.    Essential hypertension Controlled on Felodipine 5mg  24 hr, olmesartan 20mg , hydralazine 50mg  3x a day. No changes.    History of CVA (cerebrovascular accident) Plavix alone. Stop aspirin as no clear added benefit with dual antiplatelet. Follows with neuro in 4 months who can weigh in as well.    6 month follow up.  Meds ordered this encounter  Medications  . hydrALAZINE (APRESOLINE) 50 MG tablet    Sig: Take 1 tablet (50 mg total) by mouth 3 (three) times daily.    Dispense:  270 tablet    Refill:  1  . darifenacin (ENABLEX) 7.5 MG 24 hr tablet    Sig: Take 1 tablet (7.5 mg total) by mouth daily.    Dispense:  90 tablet    Refill:  3  . felodipine (PLENDIL) 5 MG 24 hr tablet    Sig: Take 1 tablet (5 mg total) by mouth daily.    Dispense:  90 tablet    Refill:  1  . olmesartan (BENICAR) 20 MG tablet    Sig: Take 1 tablet (20 mg  total) by mouth daily.    Dispense:  30 tablet    Refill:  3  . levETIRAcetam (KEPPRA) 250 MG tablet    Sig: Take 1 tablet (250 mg total) by mouth 2 (two) times daily.    Dispense:  60 tablet    Refill:  3

## 2014-06-29 NOTE — Patient Instructions (Addendum)
Stop Aspirin especially with recent bleeding from urine track. No data for added benefit taking them together vs. Just taking clopidogrel. Continue clopidogrel.   We opted not to do next pneumonia shot  Follow up in 4 months

## 2014-06-29 NOTE — Assessment & Plan Note (Signed)
Plavix alone. Stop aspirin as no clear added benefit with dual antiplatelet. Follows with neuro in 4 months who can weigh in as well.

## 2014-06-29 NOTE — Assessment & Plan Note (Signed)
Controlled.  continue keppra BID. Neuro follow up 4 months.

## 2014-06-29 NOTE — Assessment & Plan Note (Signed)
Advanced. No longer on medications. Discussed immunizations/bloodwork likely very low yield 06/29/14. Symptoms started around 2007. Discussed average life expectancy 10 years. He can pretty much only fed himself at this point and otherwise completely dependent. i encouraged wife to consider PACE again as used in the past primarily for her benefit-she certainly needs more support as only has help twice a week now.

## 2014-06-29 NOTE — Assessment & Plan Note (Signed)
Controlled on Felodipine 5mg  24 hr, olmesartan 20mg , hydralazine 50mg  3x a day. No changes.

## 2014-07-03 ENCOUNTER — Other Ambulatory Visit (HOSPITAL_COMMUNITY): Payer: Self-pay | Admitting: Urology

## 2014-07-03 DIAGNOSIS — C61 Malignant neoplasm of prostate: Secondary | ICD-10-CM

## 2014-07-04 LAB — PSA: PSA: 0.27

## 2014-07-20 ENCOUNTER — Encounter (HOSPITAL_COMMUNITY)
Admission: RE | Admit: 2014-07-20 | Discharge: 2014-07-20 | Disposition: A | Discharge status: Home / Self Care | Payer: Medicare Other | Source: Ambulatory Visit | Attending: Urology | Admitting: Urology

## 2014-07-20 DIAGNOSIS — C61 Malignant neoplasm of prostate: Secondary | ICD-10-CM | POA: Diagnosis not present

## 2014-07-20 DIAGNOSIS — R972 Elevated prostate specific antigen [PSA]: Secondary | ICD-10-CM | POA: Diagnosis not present

## 2014-07-20 MED ORDER — TECHNETIUM TC 99M MEDRONATE IV KIT
26.3000 | PACK | Freq: Once | INTRAVENOUS | Status: AC | PRN
  Administered 2014-07-20: 26.3 via INTRAVENOUS

## 2014-07-27 DIAGNOSIS — N200 Calculus of kidney: Secondary | ICD-10-CM | POA: Diagnosis not present

## 2014-08-07 ENCOUNTER — Encounter: Payer: Self-pay | Admitting: Family Medicine

## 2014-09-23 ENCOUNTER — Emergency Department (HOSPITAL_COMMUNITY): Payer: Medicare Other

## 2014-09-23 ENCOUNTER — Inpatient Hospital Stay (HOSPITAL_COMMUNITY)
Admission: EM | Admit: 2014-09-23 | Discharge: 2014-09-25 | DRG: 689 | Disposition: A | Discharge status: Home / Self Care | Payer: Medicare Other | Attending: Internal Medicine | Admitting: Internal Medicine

## 2014-09-23 ENCOUNTER — Encounter (HOSPITAL_COMMUNITY): Payer: Self-pay | Admitting: *Deleted

## 2014-09-23 DIAGNOSIS — G9340 Encephalopathy, unspecified: Secondary | ICD-10-CM | POA: Diagnosis not present

## 2014-09-23 DIAGNOSIS — Z809 Family history of malignant neoplasm, unspecified: Secondary | ICD-10-CM | POA: Diagnosis not present

## 2014-09-23 DIAGNOSIS — Z8673 Personal history of transient ischemic attack (TIA), and cerebral infarction without residual deficits: Secondary | ICD-10-CM | POA: Diagnosis not present

## 2014-09-23 DIAGNOSIS — G4733 Obstructive sleep apnea (adult) (pediatric): Secondary | ICD-10-CM | POA: Diagnosis present

## 2014-09-23 DIAGNOSIS — Z8249 Family history of ischemic heart disease and other diseases of the circulatory system: Secondary | ICD-10-CM

## 2014-09-23 DIAGNOSIS — E876 Hypokalemia: Secondary | ICD-10-CM | POA: Diagnosis not present

## 2014-09-23 DIAGNOSIS — Z7902 Long term (current) use of antithrombotics/antiplatelets: Secondary | ICD-10-CM | POA: Diagnosis not present

## 2014-09-23 DIAGNOSIS — Z79899 Other long term (current) drug therapy: Secondary | ICD-10-CM | POA: Diagnosis not present

## 2014-09-23 DIAGNOSIS — G311 Senile degeneration of brain, not elsewhere classified: Secondary | ICD-10-CM | POA: Diagnosis not present

## 2014-09-23 DIAGNOSIS — G309 Alzheimer's disease, unspecified: Secondary | ICD-10-CM | POA: Diagnosis not present

## 2014-09-23 DIAGNOSIS — I10 Essential (primary) hypertension: Secondary | ICD-10-CM | POA: Diagnosis present

## 2014-09-23 DIAGNOSIS — N390 Urinary tract infection, site not specified: Secondary | ICD-10-CM | POA: Diagnosis not present

## 2014-09-23 DIAGNOSIS — F0391 Unspecified dementia with behavioral disturbance: Secondary | ICD-10-CM

## 2014-09-23 DIAGNOSIS — E739 Lactose intolerance, unspecified: Secondary | ICD-10-CM | POA: Diagnosis present

## 2014-09-23 DIAGNOSIS — Z8546 Personal history of malignant neoplasm of prostate: Secondary | ICD-10-CM

## 2014-09-23 DIAGNOSIS — B961 Klebsiella pneumoniae [K. pneumoniae] as the cause of diseases classified elsewhere: Secondary | ICD-10-CM | POA: Diagnosis present

## 2014-09-23 DIAGNOSIS — G40909 Epilepsy, unspecified, not intractable, without status epilepticus: Secondary | ICD-10-CM | POA: Diagnosis present

## 2014-09-23 DIAGNOSIS — F0280 Dementia in other diseases classified elsewhere without behavioral disturbance: Secondary | ICD-10-CM | POA: Diagnosis present

## 2014-09-23 DIAGNOSIS — R4182 Altered mental status, unspecified: Secondary | ICD-10-CM | POA: Diagnosis not present

## 2014-09-23 DIAGNOSIS — R29818 Other symptoms and signs involving the nervous system: Secondary | ICD-10-CM | POA: Diagnosis not present

## 2014-09-23 LAB — URINE MICROSCOPIC-ADD ON

## 2014-09-23 LAB — BASIC METABOLIC PANEL
Anion gap: 10 (ref 5–15)
BUN: 16 mg/dL (ref 6–20)
CHLORIDE: 104 mmol/L (ref 101–111)
CO2: 30 mmol/L (ref 22–32)
Calcium: 9.5 mg/dL (ref 8.9–10.3)
Creatinine, Ser: 1.2 mg/dL (ref 0.61–1.24)
GFR calc non Af Amer: 56 mL/min — ABNORMAL LOW (ref >60)
Glucose, Bld: 132 mg/dL — ABNORMAL HIGH (ref 65–99)
POTASSIUM: 4 mmol/L (ref 3.5–5.1)
Sodium: 144 mmol/L (ref 135–145)

## 2014-09-23 LAB — CBC WITH DIFFERENTIAL/PLATELET
BASOS ABS: 0 10*3/uL (ref 0.0–0.1)
Basophils Relative: 0 % (ref 0–1)
Eosinophils Absolute: 0.1 10*3/uL (ref 0.0–0.7)
Eosinophils Relative: 2 % (ref 0–5)
HCT: 43.3 % (ref 39.0–52.0)
HEMOGLOBIN: 14.3 g/dL (ref 13.0–17.0)
Lymphocytes Relative: 24 % (ref 12–46)
Lymphs Abs: 1.4 10*3/uL (ref 0.7–4.0)
MCH: 28.6 pg (ref 26.0–34.0)
MCHC: 33 g/dL (ref 30.0–36.0)
MCV: 86.6 fL (ref 78.0–100.0)
Monocytes Absolute: 0.4 10*3/uL (ref 0.1–1.0)
Monocytes Relative: 6 % (ref 3–12)
NEUTROS PCT: 68 % (ref 43–77)
Neutro Abs: 3.8 10*3/uL (ref 1.7–7.7)
Platelets: 161 10*3/uL (ref 150–400)
RBC: 5 MIL/uL (ref 4.22–5.81)
RDW: 14.3 % (ref 11.5–15.5)
WBC: 5.7 10*3/uL (ref 4.0–10.5)

## 2014-09-23 LAB — URINALYSIS, ROUTINE W REFLEX MICROSCOPIC
Bilirubin Urine: NEGATIVE
Glucose, UA: NEGATIVE mg/dL
Hgb urine dipstick: NEGATIVE
Ketones, ur: NEGATIVE mg/dL
Nitrite: POSITIVE — AB
Protein, ur: NEGATIVE mg/dL
SPECIFIC GRAVITY, URINE: 1.021 (ref 1.005–1.030)
Urobilinogen, UA: 1 mg/dL (ref 0.0–1.0)
pH: 7 (ref 5.0–8.0)

## 2014-09-23 LAB — TSH: TSH: 1.804 u[IU]/mL (ref 0.350–4.500)

## 2014-09-23 MED ORDER — ONDANSETRON HCL 4 MG PO TABS: 4.0000 mg | ORAL_TABLET | Freq: Four times a day (QID) | ORAL | Status: DC | PRN

## 2014-09-23 MED ORDER — ACETAMINOPHEN 325 MG PO TABS: 650.0000 mg | ORAL_TABLET | Freq: Four times a day (QID) | ORAL | Status: DC | PRN

## 2014-09-23 MED ORDER — ENOXAPARIN SODIUM 40 MG/0.4ML ~~LOC~~ SOLN
40.0000 mg | SUBCUTANEOUS | Status: DC
  Administered 2014-09-23 – 2014-09-24 (×2): 40 mg via SUBCUTANEOUS
  Filled 2014-09-23 (×3): qty 0.4

## 2014-09-23 MED ORDER — DEXTROSE 5 % IV SOLN
1.0000 g | Freq: Once | INTRAVENOUS | Status: AC
  Administered 2014-09-23: 1 g via INTRAVENOUS
  Filled 2014-09-23: qty 10

## 2014-09-23 MED ORDER — CLOPIDOGREL BISULFATE 75 MG PO TABS
75.0000 mg | ORAL_TABLET | Freq: Every day | ORAL | Status: DC
  Administered 2014-09-24 – 2014-09-25 (×2): 75 mg via ORAL
  Filled 2014-09-23 (×3): qty 1

## 2014-09-23 MED ORDER — SODIUM CHLORIDE 0.9 % IV SOLN
INTRAVENOUS | Status: AC
  Administered 2014-09-23: 14:00:00 via INTRAVENOUS

## 2014-09-23 MED ORDER — FELODIPINE ER 5 MG PO TB24
5.0000 mg | ORAL_TABLET | Freq: Every day | ORAL | Status: DC
  Administered 2014-09-23 – 2014-09-25 (×3): 5 mg via ORAL
  Filled 2014-09-23 (×3): qty 1

## 2014-09-23 MED ORDER — ONDANSETRON HCL 4 MG/2ML IJ SOLN: 4.0000 mg | Freq: Four times a day (QID) | INTRAMUSCULAR | Status: DC | PRN

## 2014-09-23 MED ORDER — DARIFENACIN HYDROBROMIDE ER 7.5 MG PO TB24
7.5000 mg | ORAL_TABLET | Freq: Every day | ORAL | Status: DC
  Administered 2014-09-23 – 2014-09-25 (×3): 7.5 mg via ORAL
  Filled 2014-09-23 (×3): qty 1

## 2014-09-23 MED ORDER — SODIUM CHLORIDE 0.9 % IV SOLN
INTRAVENOUS | Status: DC
  Administered 2014-09-24: 04:00:00 via INTRAVENOUS

## 2014-09-23 MED ORDER — ACETAMINOPHEN 650 MG RE SUPP: 650.0000 mg | Freq: Four times a day (QID) | RECTAL | Status: DC | PRN

## 2014-09-23 MED ORDER — HYDRALAZINE HCL 50 MG PO TABS
50.0000 mg | ORAL_TABLET | Freq: Three times a day (TID) | ORAL | Status: DC
  Administered 2014-09-23 – 2014-09-25 (×7): 50 mg via ORAL
  Filled 2014-09-23 (×8): qty 1

## 2014-09-23 MED ORDER — CEFTRIAXONE SODIUM IN DEXTROSE 20 MG/ML IV SOLN
1.0000 g | INTRAVENOUS | Status: DC
  Administered 2014-09-24 – 2014-09-25 (×2): 1 g via INTRAVENOUS
  Filled 2014-09-23 (×2): qty 50

## 2014-09-23 MED ORDER — IRBESARTAN 150 MG PO TABS
150.0000 mg | ORAL_TABLET | Freq: Every day | ORAL | Status: DC
  Administered 2014-09-23 – 2014-09-25 (×3): 150 mg via ORAL
  Filled 2014-09-23 (×3): qty 1

## 2014-09-23 MED ORDER — LEVETIRACETAM 250 MG PO TABS
250.0000 mg | ORAL_TABLET | Freq: Two times a day (BID) | ORAL | Status: DC
  Administered 2014-09-23 – 2014-09-25 (×5): 250 mg via ORAL
  Filled 2014-09-23 (×6): qty 1

## 2014-09-23 NOTE — ED Notes (Signed)
Pt back from CT. Pt in NAD upon arrival back to unit

## 2014-09-23 NOTE — ED Notes (Signed)
EDP at bedside  

## 2014-09-23 NOTE — ED Provider Notes (Signed)
CSN: [id number]     Arrival date & time 09/23/14  1134 History   First MD Initiated Contact with Patient 09/23/14 1159     Chief Complaint  Patient presents with  . Altered Mental Status     (Consider location/radiation/quality/duration/timing/severity/associated sxs/prior Treatment) HPI Comments: 81 year old male with history of Alzheimer's disease, sleep apnea, high blood pressure, prostate cancer, stroke, lives at home with wife's assistance presents with worsening mental status and behavioral changes for the past few weeks. Patient has been acting strange per the wife and this is worsened. Patient was smearing grits on the table this morning and change in sleep habits. No fevers or chills. Patient has history of dementia, patient baseline oriented to self. Patient mild weaker on the left side since his stroke this is baseline. No new focal neurologic issues except for the behavioral changes. Nonsmoker  Patient is a 81 y.o. male presenting with altered mental status. The history is provided by the patient and the spouse.  Altered Mental Status   Past Medical History  Diagnosis Date  . CEREBROVASCULAR ACCIDENT, HX OF 02/09/2007  . DEMENTIA 05/12/2007  . HYPERGLYCEMIA 09/21/2009  . HYPERTENSION 12/29/2006  . OBSTRUCTIVE SLEEP APNEA 12/29/2006  . PROSTATE CANCER, HX OF 12/29/2006  . Hemorrhoids   . Lactose intolerance   . Risk for falls   . Prostatitis   . Aortic root dilatation     Noted on 09/2010 echo, with mild-mod AI  . Seizures    Past Surgical History  Procedure Laterality Date  . Cholecystectomy    . Prostate surgery      cryotherapy  . Ganglion cyst excision     Family History  Problem Relation Age of Onset  . Heart attack Father   . Heart disease Father   . Cancer Sister    History  Substance Use Topics  . Smoking status: Never Smoker   . Smokeless tobacco: Never Used  . Alcohol Use: No    Review of Systems  Unable to perform ROS: Dementia      Allergies   Review of patient's allergies indicates no known allergies.  Home Medications   Prior to Admission medications   Medication Sig Start Date End Date Taking? Authorizing Provider  clopidogrel (PLAVIX) 75 MG tablet Take 1 tablet (75 mg total) by mouth daily. 02/14/14  Yes Eulas Post, MD  darifenacin (ENABLEX) 7.5 MG 24 hr tablet Take 1 tablet (7.5 mg total) by mouth daily. 06/29/14  Yes Marin Olp, MD  felodipine (PLENDIL) 5 MG 24 hr tablet Take 1 tablet (5 mg total) by mouth daily. 06/29/14  Yes Marin Olp, MD  hydrALAZINE (APRESOLINE) 50 MG tablet Take 1 tablet (50 mg total) by mouth 3 (three) times daily. 06/29/14  Yes Marin Olp, MD  levETIRAcetam (KEPPRA) 250 MG tablet Take 1 tablet (250 mg total) by mouth 2 (two) times daily. 06/29/14  Yes Marin Olp, MD  olmesartan (BENICAR) 20 MG tablet Take 1 tablet (20 mg total) by mouth daily. 06/29/14  Yes Marin Olp, MD   BP 138/69 mmHg  Pulse 61  Temp(Src) 98.4 F (36.9 C) (Oral)  Resp 14  SpO2 96% Physical Exam  Constitutional: He is oriented to person, place, and time. He appears well-developed and well-nourished.  HENT:  Head: Normocephalic and atraumatic.  Eyes: Right eye exhibits no discharge. Left eye exhibits no discharge.  Neck: Normal range of motion. Neck supple. No tracheal deviation present.  Cardiovascular: Normal rate and regular  rhythm.   Pulmonary/Chest: Effort normal and breath sounds normal.  Abdominal: Soft. He exhibits no distension. There is no tenderness. There is no guarding.  Musculoskeletal: He exhibits no edema.  Neurological: He is alert and oriented to person, place, and time. GCS eye subscore is 4. GCS verbal subscore is 3. GCS motor subscore is 6.  Patient has no obvious arm drift, grossly equal strength bilateral, difficulties neuro exam due to dementia and altered mental status. Patient finger nose intact in the right, patient has difficulty following commands for the left.  Gross sensation intact bilateral. Pupils equal bilateral horizontal eye movements intact.  Skin: Skin is warm. No rash noted.  Psychiatric: His affect is not angry.  Patient with dementia history, no agitation, mild tearful during neurologic exam when having difficulty.  Nursing note and vitals reviewed.   ED Course  Procedures (including critical care time) Labs Review Labs Reviewed  BASIC METABOLIC PANEL - Abnormal; Notable for the following:    Glucose, Bld 132 (*)    GFR calc non Af Amer 56 (*)    All other components within normal limits  URINALYSIS, ROUTINE W REFLEX MICROSCOPIC - Abnormal; Notable for the following:    APPearance CLOUDY (*)    Nitrite POSITIVE (*)    Leukocytes, UA LARGE (*)    All other components within normal limits  URINE MICROSCOPIC-ADD ON - Abnormal; Notable for the following:    Bacteria, UA MANY (*)    All other components within normal limits  CBC WITH DIFFERENTIAL/PLATELET  TSH    Imaging Review Ct Head Wo Contrast  09/23/2014   CLINICAL DATA:  Patient acting strange for 1 week. Patient with history of dementia and prior TIAs. Patient neurologically weak on left side.  EXAM: CT HEAD WITHOUT CONTRAST  TECHNIQUE: Contiguous axial images were obtained from the base of the skull through the vertex without intravenous contrast.  COMPARISON:  Brain CT 02/03/2014  FINDINGS: Ventricles and sulci are prominent compatible with atrophy. Periventricular and subcortical white matter hypodensity compatible with chronic small vessel ischemic changes. Bilateral basal ganglia lacunar infarcts. Basal ganglia calcifications. No evidence for acute cortically based infarct, intracranial hemorrhage, mass lesion or mass-effect. Orbits are unremarkable. Polypoid mucosal thickening frontal sinus. Mastoid air cells are unremarkable. Calvarium is intact.  IMPRESSION: No acute intracranial process.  Diffuse cerebral atrophy and chronic small vessel ischemic change.   Electronically  Signed   By: Lovey Newcomer [name]   On: 09/23/2014 12:58     EKG Interpretation None      MDM   Final diagnoses:  Altered mental status  UTI (lower urinary tract infection)  Dementia, with behavioral disturbance   Patient with dementia history and stroke history presents with worsening behavioral mental status changes. Discussed possibly related to worsening dementia versus urine infection versus new stroke. Patient's on Plavix for previous stroke. Plan for blood work, urinalysis and CT head.  CT head results reviewed no acute findings. Blood work overall unremarkable. Vitals unremarkable. Patient has significant urine infection with behavioral changes family unable to care for him. Rocephin ordered admitted to [facility].  The patients results and plan were reviewed and discussed.   Any x-rays performed were personally reviewed by myself.   Differential diagnosis were considered with the presenting HPI.  Medications  cefTRIAXone (ROCEPHIN) 1 g in dextrose 5 % 50 mL IVPB (not administered)  0.9 %  sodium chloride infusion (not administered)    Filed Vitals:   09/23/14 1150 09/23/14 1228 09/23/14 1300  09/23/14 1330  BP: 133/82 131/69 135/67 138/69  Pulse: 93 77 69 61  Temp: 98.4 F (36.9 C)     TempSrc: Oral     Resp: 15 16 17 14   SpO2: 95% 97% 95% 96%    Final diagnoses:  Altered mental status  UTI (lower urinary tract infection)  Dementia, with behavioral disturbance    Admission/ observation were discussed with the admitting physician, patient and/or family and they are comfortable with the plan.      Elnora Morrison, MD 09/23/14 1346

## 2014-09-23 NOTE — ED Notes (Signed)
Per wife pt "acting strange for a week."  Wife states pt "pulled rug up in bedroom, bed a mess, and grits smeared on table during breakfast." Wife continues to reports pt sometimes can answer who he is NORMALLY; pt orientation at baseline at present time. Pt hx of dementia and TIA. Upon assessment, neurologically pt weaker on left side, assymmetry right face, and oriented only to self; WIFE REPORTS ALL ASSESSMENT IS BASELINE.

## 2014-09-23 NOTE — H&P (Signed)
PCP:   Garret Reddish, MD   Chief Complaint:  Altered mental status  HPI: 81 year old male who   has a past medical history of CEREBROVASCULAR ACCIDENT, HX OF (02/09/2007); DEMENTIA (05/12/2007); HYPERGLYCEMIA (09/21/2009); HYPERTENSION (12/29/2006); OBSTRUCTIVE SLEEP APNEA (12/29/2006); PROSTATE CANCER, HX OF (12/29/2006); Hemorrhoids; Lactose intolerance; Risk for falls; Prostatitis; Aortic root dilatation; and Seizures. Today was brought to the hospital from home for change in mental status. Patient's has Alzheimer disease and is cared by his wife at home. As per wife over the past few days patient has not been sleeping well and this morning was behaving very strange, did not eat his cereal took out the cereal and spread out on the table. Also did not eat sausage and threw up in the air. As per wife this behavior is not usual for the patient. So he was brought to the hospital for further evaluation. In the ED patient found to have abnormal UA and started on IV Rocephin. Surprisingly patient is very calm and able to answer all the questions, he denies any chest pain or shortness of breath no nausea vomiting or diarrhea no dysuria. Patient knows that he is in the hospital was able to recognize his wife.   Allergies:  No Known Allergies    Past Medical History  Diagnosis Date  . CEREBROVASCULAR ACCIDENT, HX OF 02/09/2007  . DEMENTIA 05/12/2007  . HYPERGLYCEMIA 09/21/2009  . HYPERTENSION 12/29/2006  . OBSTRUCTIVE SLEEP APNEA 12/29/2006  . PROSTATE CANCER, HX OF 12/29/2006  . Hemorrhoids   . Lactose intolerance   . Risk for falls   . Prostatitis   . Aortic root dilatation     Noted on 09/2010 echo, with mild-mod AI  . Seizures     Past Surgical History  Procedure Laterality Date  . Cholecystectomy    . Prostate surgery      cryotherapy  . Ganglion cyst excision      Prior to Admission medications   Medication Sig Start Date End Date Taking? Authorizing Provider  clopidogrel (PLAVIX) 75  MG tablet Take 1 tablet (75 mg total) by mouth daily. 02/14/14  Yes Eulas Post, MD  darifenacin (ENABLEX) 7.5 MG 24 hr tablet Take 1 tablet (7.5 mg total) by mouth daily. 06/29/14  Yes Marin Olp, MD  felodipine (PLENDIL) 5 MG 24 hr tablet Take 1 tablet (5 mg total) by mouth daily. 06/29/14  Yes Marin Olp, MD  hydrALAZINE (APRESOLINE) 50 MG tablet Take 1 tablet (50 mg total) by mouth 3 (three) times daily. 06/29/14  Yes Marin Olp, MD  levETIRAcetam (KEPPRA) 250 MG tablet Take 1 tablet (250 mg total) by mouth 2 (two) times daily. 06/29/14  Yes Marin Olp, MD  olmesartan (BENICAR) 20 MG tablet Take 1 tablet (20 mg total) by mouth daily. 06/29/14  Yes Marin Olp, MD    Social History:  reports that he has never smoked. He has never used smokeless tobacco. He reports that he does not drink alcohol or use illicit drugs.  Family History  Problem Relation Age of Onset  . Heart attack Father   . Heart disease Father   . Cancer Sister      All the positives are listed in BOLD  Review of Systems:  HEENT: Headache, blurred vision, runny nose, sore throat Neck: Hypothyroidism, hyperthyroidism,,lymphadenopathy Chest : Shortness of breath, history of COPD, Asthma Heart : Chest pain, history of coronary arterey disease GI:  Nausea, vomiting, diarrhea, constipation, GERD GU: Dysuria, urgency,  frequency of urination, hematuria Neuro: Stroke, seizures, syncope Psych: Depression, anxiety, hallucinations   Physical Exam: Blood pressure 138/69, pulse 61, temperature 98.4 F (36.9 C), temperature source Oral, resp. rate 14, SpO2 96 %. Constitutional:   Patient is a well-developed and well-nourished male* in no acute distress and cooperative with exam. Head: Normocephalic and atraumatic Mouth: Mucus membranes moist Eyes: PERRL, EOMI, conjunctivae normal Neck: Supple, No Thyromegaly Cardiovascular: RRR, S1 normal, S2 normal Pulmonary/Chest: CTAB, no wheezes, rales,  or rhonchi Abdominal: Soft. Non-tender, non-distended, bowel sounds are normal, no masses, organomegaly, or guarding present.  Neurological: A&O x2, Strength is normal and symmetric bilaterally, cranial nerve II-XII are grossly intact, no focal motor deficit, sensory intact to light touch bilaterally.  Extremities : No Cyanosis, Clubbing or Edema  Labs on Admission:  Basic Metabolic Panel:  Recent Labs Lab 09/23/14 1210  NA 144  K 4.0  CL 104  CO2 30  GLUCOSE 132*  BUN 16  CREATININE 1.20  CALCIUM 9.5   LCBC:  Recent Labs Lab 09/23/14 1210  WBC 5.7  NEUTROABS 3.8  HGB 14.3  HCT 43.3  MCV 86.6  PLT 161    Radiological Exams on Admission: Ct Head Wo Contrast  09/23/2014   CLINICAL DATA:  Patient acting strange for 1 week. Patient with history of dementia and prior TIAs. Patient neurologically weak on left side.  EXAM: CT HEAD WITHOUT CONTRAST  TECHNIQUE: Contiguous axial images were obtained from the base of the skull through the vertex without intravenous contrast.  COMPARISON:  Brain CT 02/03/2014  FINDINGS: Ventricles and sulci are prominent compatible with atrophy. Periventricular and subcortical white matter hypodensity compatible with chronic small vessel ischemic changes. Bilateral basal ganglia lacunar infarcts. Basal ganglia calcifications. No evidence for acute cortically based infarct, intracranial hemorrhage, mass lesion or mass-effect. Orbits are unremarkable. Polypoid mucosal thickening frontal sinus. Mastoid air cells are unremarkable. Calvarium is intact.  IMPRESSION: No acute intracranial process.  Diffuse cerebral atrophy and chronic small vessel ischemic change.   Electronically Signed   By: Lovey Newcomer [name]   On: 09/23/2014 12:58       Assessment/Plan Active Problems:   Alzheimer's dementia   Essential hypertension   History of CVA (cerebrovascular accident)   Altered mental status   UTI (lower urinary tract infection)  UTI We'll admit the patient  to the hospital, start IV Rocephin. IV fluids normal saline at 75 mL per hour. Will obtain urine and blood cultures. Follow the culture results.  Dementia without behavior disturbance Patient has dementia, he is very pleasant with no behavior issues at this time  History of CVA Continue Plavix  History of seizures Continue Keppra  Hypertension Blood pressure stable, continue hydralazine and Benicar  DVT prophylaxis Lovenox  Code status: Patient is full code  Family discussion: Admission, patients condition and plan of care including tests being ordered have been discussed with the patient and *his wife and daughter at bedside* who indicate understanding and agree with the plan and Code Status.   Time Spent on Admission: 60 min  [facility] Pager: [phone] 09/23/2014, 2:05 PM  If 7PM-7AM, please contact night-coverage  [email or web address]  Password [email or web address]

## 2014-09-23 NOTE — ED Notes (Signed)
Pt's wife reports pt has been acting "strangely" this week. For past few weeks he has been sleeping constantly. But this week sleeping less, and up moving furniture around, not recognizing simple things such as sausage for breakfast this morning, smearing grits all over the cabinets. Grips equal bilaterally, Legs equal bilaterally, slight facial drooping on R, which wife sts is normal and unchanged from a previous TIA/CVA. Wife was concerned for TIA so she brought him here. Unable to determine LSN as these "strange acts" have been happening intermittently over past week. No abrupt change in strength or mental status.

## 2014-09-23 NOTE — ED Notes (Signed)
Nurse currently starting IV 

## 2014-09-23 NOTE — ED Notes (Signed)
Pt to CT at this time.

## 2014-09-24 DIAGNOSIS — G9340 Encephalopathy, unspecified: Secondary | ICD-10-CM

## 2014-09-24 DIAGNOSIS — N390 Urinary tract infection, site not specified: Principal | ICD-10-CM

## 2014-09-24 DIAGNOSIS — E876 Hypokalemia: Secondary | ICD-10-CM

## 2014-09-24 DIAGNOSIS — I10 Essential (primary) hypertension: Secondary | ICD-10-CM

## 2014-09-24 LAB — CBC
HEMATOCRIT: 38 % — AB (ref 39.0–52.0)
HEMOGLOBIN: 12.6 g/dL — AB (ref 13.0–17.0)
MCH: 28.2 pg (ref 26.0–34.0)
MCHC: 33.2 g/dL (ref 30.0–36.0)
MCV: 85 fL (ref 78.0–100.0)
Platelets: 203 10*3/uL (ref 150–400)
RBC: 4.47 MIL/uL (ref 4.22–5.81)
RDW: 14 % (ref 11.5–15.5)
WBC: 7.2 10*3/uL (ref 4.0–10.5)

## 2014-09-24 LAB — COMPREHENSIVE METABOLIC PANEL
ALK PHOS: 70 U/L (ref 38–126)
ALT: 11 U/L — ABNORMAL LOW (ref 17–63)
ANION GAP: 7 (ref 5–15)
AST: 16 U/L (ref 15–41)
Albumin: 3.2 g/dL — ABNORMAL LOW (ref 3.5–5.0)
BUN: 11 mg/dL (ref 6–20)
CO2: 28 mmol/L (ref 22–32)
CREATININE: 0.87 mg/dL (ref 0.61–1.24)
Calcium: 8.6 mg/dL — ABNORMAL LOW (ref 8.9–10.3)
Chloride: 105 mmol/L (ref 101–111)
Glucose, Bld: 115 mg/dL — ABNORMAL HIGH (ref 65–99)
Potassium: 3.3 mmol/L — ABNORMAL LOW (ref 3.5–5.1)
Sodium: 140 mmol/L (ref 135–145)
Total Bilirubin: 0.5 mg/dL (ref 0.3–1.2)
Total Protein: 6.7 g/dL (ref 6.5–8.1)

## 2014-09-24 MED ORDER — POTASSIUM CHLORIDE CRYS ER 20 MEQ PO TBCR
40.0000 meq | EXTENDED_RELEASE_TABLET | Freq: Once | ORAL | Status: AC
  Administered 2014-09-24: 40 meq via ORAL
  Filled 2014-09-24: qty 2

## 2014-09-24 NOTE — Progress Notes (Addendum)
PROGRESS NOTE    Mike Williams ZOX:[id number] DOB: 08/15/1932 DOA: 09/23/2014 PCP: Garret Reddish, MD  HPI/Brief narrative 81 year old male patient with history of advanced dementia, CVA, HTN, lives with his spouse, admitted to the hospital on 09/23/14 with few days' history of progressive altered mental status (not sleeping well, behaving strangely-spread cereal out on the table, throwing sausage up in the air) and found to have UTI. Mental status improving with treatment.   Assessment/Plan:  UTI - Continue IV Rocephin pending urine culture results  Acute encephalopathy - Secondary to UTI complicating underlying advanced dementia - Mental status improving and as per nursing may be at baseline  History of CVA - Continue Plavix  - No clinical features suggestive of new CVA. - CT head negative for acute process   Seizure disorder - Continue Keppra   Essential hypertension - Mildly uncontrolled - Continue hydralazine, Plendil and Benicar. Monitor and may need adjustment  Hypokalemia  - replace and follow   DVT prophylaxis: Lovenox Code Status: Full Family Communication: discussed with spouse on 5/22 Disposition Plan: DC home possibly in 1-2 days.   Consultants:  None  Procedures:  None  Antibiotics:  IV Rocephin 5/21>   Subjective: Pleasantly confused. As per nursing, mental status is improved and probably at baseline. No agitation.   Objective: Filed Vitals:   09/23/14 1430 09/23/14 1540 09/23/14 2032 09/24/14 0446  BP: 163/71 176/75 165/85 161/80  Pulse: 60 62 85 63  Temp:  98.6 F (37 C) 97.4 F (36.3 C) 99.1 F (37.3 C)  TempSrc:  Oral Oral Other (Comment)  Resp: 16 16 16 16   Height:  5\' 8"  (1.727 m)    SpO2: 99% 98% 98% 98%    Intake/Output Summary (Last 24 hours) at 09/24/14 0720 Last data filed at 09/24/14 0606  Gross per 24 hour  Intake 1425.8 ml  Output   1625 ml  Net -199.2 ml   There were no vitals filed for this  visit.   Exam:  General exam: Pleasant elderly male lying comfortably in bed Respiratory system: Clear. No increased work of breathing. Cardiovascular system: S1 & S2 heard, RRR. No JVD, murmurs, gallops, clicks or pedal edema. Gastrointestinal system: Abdomen is nondistended, soft and nontender. Normal bowel sounds heard. Central nervous system: Alert and oriented only to self . No focal neurological deficits. Extremities: Symmetric 5 x 5 power.   Data Reviewed: Basic Metabolic Panel:  Recent Labs Lab 09/23/14 1210 09/24/14 0414  NA 144 140  K 4.0 3.3*  CL 104 105  CO2 30 28  GLUCOSE 132* 115*  BUN 16 11  CREATININE 1.20 0.87  CALCIUM 9.5 8.6*   Liver Function Tests:  Recent Labs Lab 09/24/14 0414  AST 16  ALT 11*  ALKPHOS 70  BILITOT 0.5  PROT 6.7  ALBUMIN 3.2*   No results for input(s): LIPASE, AMYLASE in the last 168 hours. No results for input(s): AMMONIA in the last 168 hours. CBC:  Recent Labs Lab 09/23/14 1210 09/24/14 0414  WBC 5.7 7.2  NEUTROABS 3.8  --   HGB 14.3 12.6*  HCT 43.3 38.0*  MCV 86.6 85.0  PLT 161 203   Cardiac Enzymes: No results for input(s): CKTOTAL, CKMB, CKMBINDEX, TROPONINI in the last 168 hours. BNP (last 3 results) No results for input(s): PROBNP in the last 8760 hours. CBG: No results for input(s): GLUCAP in the last 168 hours.  No results found for this or any previous visit (from the past 240 hour(s)).  Studies: Ct Head Wo Contrast  09/23/2014   CLINICAL DATA:  Patient acting strange for 1 week. Patient with history of dementia and prior TIAs. Patient neurologically weak on left side.  EXAM: CT HEAD WITHOUT CONTRAST  TECHNIQUE: Contiguous axial images were obtained from the base of the skull through the vertex without intravenous contrast.  COMPARISON:  Brain CT 02/03/2014  FINDINGS: Ventricles and sulci are prominent compatible with atrophy. Periventricular and subcortical white matter hypodensity compatible  with chronic small vessel ischemic changes. Bilateral basal ganglia lacunar infarcts. Basal ganglia calcifications. No evidence for acute cortically based infarct, intracranial hemorrhage, mass lesion or mass-effect. Orbits are unremarkable. Polypoid mucosal thickening frontal sinus. Mastoid air cells are unremarkable. Calvarium is intact.  IMPRESSION: No acute intracranial process.  Diffuse cerebral atrophy and chronic small vessel ischemic change.   Electronically Signed   By: Lovey Newcomer [name]   On: 09/23/2014 12:58        Scheduled Meds: . cefTRIAXone (ROCEPHIN)  IV  1 g Intravenous Q24H  . clopidogrel  75 mg Oral Daily  . darifenacin  7.5 mg Oral Daily  . enoxaparin (LOVENOX) injection  40 mg Subcutaneous Q24H  . felodipine  5 mg Oral Daily  . hydrALAZINE  50 mg Oral TID  . irbesartan  150 mg Oral Daily  . levETIRAcetam  250 mg Oral BID   Continuous Infusions: . sodium chloride 75 mL/hr at 09/24/14 8144    Active Problems:   Alzheimer's dementia   Essential hypertension   History of CVA (cerebrovascular accident)   Altered mental status   UTI (lower urinary tract infection)    Time spent: 29 minutes   Zurri Rudden, MD, FACP, FHM. [facility] Pager [phone]  If 7PM-7AM, please contact night-coverage [email or web address] Password [email or web address] 09/24/2014, 7:20 AM    LOS: 1 day

## 2014-09-25 DIAGNOSIS — F0280 Dementia in other diseases classified elsewhere without behavioral disturbance: Secondary | ICD-10-CM

## 2014-09-25 DIAGNOSIS — G309 Alzheimer's disease, unspecified: Secondary | ICD-10-CM

## 2014-09-25 LAB — BASIC METABOLIC PANEL
ANION GAP: 7 (ref 5–15)
BUN: 11 mg/dL (ref 6–20)
CHLORIDE: 106 mmol/L (ref 101–111)
CO2: 27 mmol/L (ref 22–32)
Calcium: 8.7 mg/dL — ABNORMAL LOW (ref 8.9–10.3)
Creatinine, Ser: 0.98 mg/dL (ref 0.61–1.24)
GFR calc Af Amer: >60 mL/min (ref >60)
GLUCOSE: 91 mg/dL (ref 65–99)
Potassium: 3.6 mmol/L (ref 3.5–5.1)
Sodium: 140 mmol/L (ref 135–145)

## 2014-09-25 LAB — URINE CULTURE: Colony Count: >=100000

## 2014-09-25 MED ORDER — CEFUROXIME AXETIL 250 MG PO TABS: 250.0000 mg | ORAL_TABLET | Freq: Two times a day (BID) | ORAL | Status: DC

## 2014-09-25 NOTE — Discharge Instructions (Signed)

## 2014-09-25 NOTE — Discharge Summary (Signed)
Physician Discharge Summary  Mike Williams YIR:[id number] DOB: 24-Nov-1932 DOA: 09/23/2014  PCP: Garret Reddish, MD  Admit date: 09/23/2014 Discharge date: 09/25/2014  Time spent: Less than 30 minutes  Recommendations for Outpatient Follow-up:  1. Dr. Garret Reddish, PCP in 1 week 2. Home health PT  Discharge Diagnoses:  Active Problems:   Alzheimer's dementia   Essential hypertension   History of CVA (cerebrovascular accident)   Altered mental status   UTI (lower urinary tract infection)   Discharge Condition: Improved & Stable  Diet recommendation: Heart healthy diet.  Filed Weights   09/24/14 0900  Weight: 73.029 kg (161 lb)    History of present illness:  81 year old male patient with history of advanced dementia, CVA, HTN, lives with his spouse, admitted to the hospital on 09/23/14 with few days' history of progressive altered mental status (not sleeping well, behaving strangely-spread cereal out on the table, throwing sausage up in the air) and found to have UTI. Mental status improving with treatment.  Hospital Course:   Klebsiella pneumonia UTI - Treated empirically with IV Rocephin and completed 3 days thus far. - As discussed with pharmacy, will change to oral Ceftin adjusted to renal function and complete total 7 days course of antibiotics.  Acute encephalopathy - Secondary to UTI complicating underlying advanced dementia - As per discussion with spouse on 5/22, mental status changes have resolved and patient is back to baseline.  History of CVA - Continue Plavix  - No clinical features suggestive of new CVA. - CT head negative for acute process  Seizure disorder - Continue Keppra   Essential hypertension - Reasonable inpatient control. - Continue hydralazine, Plendil and Benicar. Monitor and may need adjustment OP.  Hypokalemia  - replaced   Consultants:  None  Procedures:  None   Discharge Exam:  Complaints: Patient pleasantly confused.  Denies complaints. As per nursing, no acute events.  Filed Vitals:   09/24/14 1427 09/24/14 1650 09/24/14 2030 09/25/14 0550  BP: 121/57 121/57 131/75 147/54  Pulse: 77  65 51  Temp: 98.7 F (37.1 C)  98.8 F (37.1 C) 98.2 F (36.8 C)  TempSrc: Oral  Oral Oral  Resp: 16  20 20   Height:      Weight:      SpO2: 98%  98% 99%    General exam: Pleasant elderly male lying comfortably in bed Respiratory system: Clear. No increased work of breathing. Cardiovascular system: S1 & S2 heard, RRR. No JVD, murmurs, gallops, clicks or pedal edema. Gastrointestinal system: Abdomen is nondistended, soft and nontender. Normal bowel sounds heard. Central nervous system: Alert and oriented only to self . No focal neurological deficits. Extremities: Symmetric 5 x 5 power.  Discharge Instructions      Discharge Instructions    Call MD for:  extreme fatigue    Complete by:  As directed      Call MD for:  persistant dizziness or light-headedness    Complete by:  As directed      Call MD for:  persistant nausea and vomiting    Complete by:  As directed      Call MD for:  severe uncontrolled pain    Complete by:  As directed      Call MD for:  temperature >100.4    Complete by:  As directed      Call MD for:    Complete by:  As directed   Worsening confusion.     Diet - low sodium heart healthy  Complete by:  As directed      Increase activity slowly    Complete by:  As directed             Medication List    TAKE these medications        cefUROXime 250 MG tablet  Commonly known as:  CEFTIN  Take 1 tablet (250 mg total) by mouth 2 (two) times daily with a meal.  Start taking on:  09/26/2014     clopidogrel 75 MG tablet  Commonly known as:  PLAVIX  Take 1 tablet (75 mg total) by mouth daily.     darifenacin 7.5 MG 24 hr tablet  Commonly known as:  ENABLEX  Take 1 tablet (7.5 mg total) by mouth daily.     felodipine 5 MG 24 hr tablet  Commonly known as:  PLENDIL  Take 1  tablet (5 mg total) by mouth daily.     hydrALAZINE 50 MG tablet  Commonly known as:  APRESOLINE  Take 1 tablet (50 mg total) by mouth 3 (three) times daily.     levETIRAcetam 250 MG tablet  Commonly known as:  KEPPRA  Take 1 tablet (250 mg total) by mouth 2 (two) times daily.     olmesartan 20 MG tablet  Commonly known as:  BENICAR  Take 1 tablet (20 mg total) by mouth daily.       Follow-up Information    Follow up with Garret Reddish, MD. Schedule an appointment as soon as possible for a visit in 1 week.   Specialty:  Family Medicine   Contact information:   [address] [place] [address] [phone]        The results of significant diagnostics from this hospitalization (including imaging, microbiology, ancillary and laboratory) are listed below for reference.    Significant Diagnostic Studies: Ct Head Wo Contrast  09/23/2014   CLINICAL DATA:  Patient acting strange for 1 week. Patient with history of dementia and prior TIAs. Patient neurologically weak on left side.  EXAM: CT HEAD WITHOUT CONTRAST  TECHNIQUE: Contiguous axial images were obtained from the base of the skull through the vertex without intravenous contrast.  COMPARISON:  Brain CT 02/03/2014  FINDINGS: Ventricles and sulci are prominent compatible with atrophy. Periventricular and subcortical white matter hypodensity compatible with chronic small vessel ischemic changes. Bilateral basal ganglia lacunar infarcts. Basal ganglia calcifications. No evidence for acute cortically based infarct, intracranial hemorrhage, mass lesion or mass-effect. Orbits are unremarkable. Polypoid mucosal thickening frontal sinus. Mastoid air cells are unremarkable. Calvarium is intact.  IMPRESSION: No acute intracranial process.  Diffuse cerebral atrophy and chronic small vessel ischemic change.   Electronically Signed   By: Lovey Newcomer [name]   On: 09/23/2014 12:58    Microbiology: Recent Results (from the past 240  hour(s))  Culture, Urine     Status: None   Collection Time: 09/23/14 12:45 PM  Result Value Ref Range Status   Specimen Description URINE, CATHETERIZED  Final   Special Requests NONE  Final   Colony Count   Final    >=100,000 COLONIES/ML Performed at [facility]    Culture   Final    KLEBSIELLA PNEUMONIAE Performed at [facility]    Report Status 09/25/2014 FINAL  Final   Organism ID, Bacteria KLEBSIELLA PNEUMONIAE  Final      Susceptibility   Klebsiella pneumoniae - MIC*    AMPICILLIN >=32 RESISTANT Resistant     CEFAZOLIN <=4 SENSITIVE Sensitive  CEFTRIAXONE <=1 SENSITIVE Sensitive     CIPROFLOXACIN <=0.25 SENSITIVE Sensitive     GENTAMICIN <=1 SENSITIVE Sensitive     LEVOFLOXACIN <=0.12 SENSITIVE Sensitive     NITROFURANTOIN 32 SENSITIVE Sensitive     TOBRAMYCIN <=1 SENSITIVE Sensitive     TRIMETH/SULFA <=20 SENSITIVE Sensitive     PIP/TAZO <=4 SENSITIVE Sensitive     * KLEBSIELLA PNEUMONIAE  Culture, blood (routine x 2)     Status: None (Preliminary result)   Collection Time: 09/23/14  5:45 PM  Result Value Ref Range Status   Specimen Description BLOOD LEFT ARM  Final   Special Requests BOTTLES DRAWN AEROBIC ONLY 5CC  Final   Culture   Final           BLOOD CULTURE RECEIVED NO GROWTH TO DATE CULTURE WILL BE HELD FOR 5 DAYS BEFORE ISSUING A FINAL NEGATIVE REPORT Performed at [facility]    Report Status PENDING  Incomplete  Culture, blood (routine x 2)     Status: None (Preliminary result)   Collection Time: 09/23/14  5:50 PM  Result Value Ref Range Status   Specimen Description BLOOD RIGHT HAND  Final   Special Requests BOTTLES DRAWN AEROBIC ONLY 8CC  Final   Culture   Final           BLOOD CULTURE RECEIVED NO GROWTH TO DATE CULTURE WILL BE HELD FOR 5 DAYS BEFORE ISSUING A FINAL NEGATIVE REPORT Performed at [facility]    Report Status PENDING  Incomplete     Labs: Basic Metabolic Panel:  Recent Labs Lab 09/23/14 1210  09/24/14 0414 09/25/14 0418  NA 144 140 140  K 4.0 3.3* 3.6  CL 104 105 106  CO2 30 28 27   GLUCOSE 132* 115* 91  BUN 16 11 11   CREATININE 1.20 0.87 0.98  CALCIUM 9.5 8.6* 8.7*   Liver Function Tests:  Recent Labs Lab 09/24/14 0414  AST 16  ALT 11*  ALKPHOS 70  BILITOT 0.5  PROT 6.7  ALBUMIN 3.2*   No results for input(s): LIPASE, AMYLASE in the last 168 hours. No results for input(s): AMMONIA in the last 168 hours. CBC:  Recent Labs Lab 09/23/14 1210 09/24/14 0414  WBC 5.7 7.2  NEUTROABS 3.8  --   HGB 14.3 12.6*  HCT 43.3 38.0*  MCV 86.6 85.0  PLT 161 203   Cardiac Enzymes: No results for input(s): CKTOTAL, CKMB, CKMBINDEX, TROPONINI in the last 168 hours. BNP: BNP (last 3 results) No results for input(s): BNP in the last 8760 hours.  ProBNP (last 3 results) No results for input(s): PROBNP in the last 8760 hours.  CBG: No results for input(s): GLUCAP in the last 168 hours.     Signed:  Vernell Leep, MD, FACP, FHM. [facility] Pager [phone]  If 7PM-7AM, please contact night-coverage [email or web address] Password [email or web address] 09/25/2014, 1:56 PM

## 2014-09-25 NOTE — Progress Notes (Signed)
Nursing Discharge Summary  Patient ID: Dave Mergen MRN: [id number] DOB/AGE: [birth year] 81 y.o.  Admit date: 09/23/2014 Discharge date: 09/25/2014  Discharged Condition: good  Disposition: 01-Home or Self Care  Follow-up Information    Follow up with Garret Reddish, MD. Schedule an appointment as soon as possible for a visit in 1 week.   Specialty:  Family Medicine   Contact information:   [place] [address] [phone]       Follow up with Garret Reddish, MD. Schedule an appointment as soon as possible for a visit on 10/03/2014.   Specialty:  Family Medicine   Contact information:   [address] [place] [address] [phone]       Follow up with Garret Reddish, MD On 10/03/2014.   Specialty:  Family Medicine   Why:  Appt 10/03/2014 at 1:30 pm. Please call if need to reschedule.   Contact information:   [place] [address] [phone]       Prescriptions Given: Prescription given for Ceftin. Patient follow up appointments and medications discussed with wife at beside. Wife verbalized understanding without further questions.  Means of Discharge: patient to be taken downstairs via wheelchair to be discharged home.   Signed: Buel Ream 09/25/2014, 4:54 PM

## 2014-09-25 NOTE — Evaluation (Signed)
Physical Therapy Evaluation Patient Details Name: Mccartney Chuba MRN: [id number] DOB: 06-04-1932 Today's Date: 09/25/2014   History of Present Illness  81 yo male admitted with AMS. Hx of Alz dementia, TIA/CVA, sleep apnea, HTN, prostate cancer. Pt lives at home with wife.   Clinical Impression  On eval, pt required Min assist for mobility-able to ambulate ~375 feet while holding onto IV pole for support. Pt unable to safely ambulate without external support. No family present during session. Recommend 24 hour supervision/assist-if family is unable may need to consider SNF.     Follow Up Recommendations Home health PT;Supervision/Assistance - 24 hour (as long as family is able to provide current level of assist. Consider SNF if family is unable)    Equipment Recommendations   (unsure of DME available to pt at home)    Recommendations for Other Services       Precautions / Restrictions Precautions Precautions: Fall Restrictions Weight Bearing Restrictions: No      Mobility  Bed Mobility Overal bed mobility: Needs Assistance Bed Mobility: Supine to Sit;Sit to Supine     Supine to sit: Min assist;HOB elevated Sit to supine: Min assist;HOB elevated   General bed mobility comments: Multimodal cueing for initiation/completion of task. Pt tends to stop unless constant cueing is given. Assist for trunk and bil LEs. Increased time.   Transfers Overall transfer level: Needs assistance   Transfers: Sit to/from Stand Sit to Stand: Min assist;From elevated surface         General transfer comment: Assist to rise, stabilize, control descent. Multmodal cueing needed.  Ambulation/Gait Ambulation/Gait assistance: Min assist Ambulation Distance (Feet): 375 Feet Assistive device:  (IV pole) Gait Pattern/deviations: Step-through pattern;Trunk flexed;Decreased stride length     General Gait Details: Cane present in room (pt shook his head no when asked if it was his). Attempted  ambulation without external support-pt unable to safely walk without at least 1 UE support-used IV pole. Min assist for intermittent steadying.   Stairs            Wheelchair Mobility    Modified Rankin (Stroke Patients Only)       Balance Overall balance assessment: Needs assistance         Standing balance support: Single extremity supported;During functional activity Standing balance-Leahy Scale: Fair                               Pertinent Vitals/Pain Pain Assessment: No/denies pain    Home Living Family/patient expects to be discharged to:: Private residence Living Arrangements: Spouse/significant other               Additional Comments: pt unable to provide home environment info    Prior Function           Comments: pt unable to provide PLOF     Hand Dominance        Extremity/Trunk Assessment   Upper Extremity Assessment: Generalized weakness           Lower Extremity Assessment: Generalized weakness      Cervical / Trunk Assessment: Kyphotic  Communication   Communication: No difficulties  Cognition Arousal/Alertness: Awake/alert Behavior During Therapy: Flat affect Overall Cognitive Status: History of cognitive impairments - at baseline                      General Comments      Exercises  Assessment/Plan    PT Assessment Patient needs continued PT services  PT Diagnosis Difficulty walking;Generalized weakness;Altered mental status   PT Problem List Decreased balance;Decreased mobility;Decreased cognition;Decreased knowledge of use of DME  PT Treatment Interventions DME instruction;Gait training;Functional mobility training;Therapeutic activities;Therapeutic exercise;Patient/family education;Balance training   PT Goals (Current goals can be found in the Care Plan section) Acute Rehab PT Goals Patient Stated Goal: none stated PT Goal Formulation: Patient unable to participate in goal  setting Time For Goal Achievement: 10/09/14 Potential to Achieve Goals: Fair    Frequency Min 3X/week   Barriers to discharge        Co-evaluation               End of Session Equipment Utilized During Treatment: Gait belt Activity Tolerance: Patient tolerated treatment well Patient left: in bed;with call bell/phone within reach;with bed alarm set           Time: 1026-1040 PT Time Calculation (min) (ACUTE ONLY): 14 min   Charges:   PT Evaluation $Initial PT Evaluation Tier I: 1 Procedure     PT G Codes:        Weston Anna, MPT Pager: [phone]

## 2014-09-25 NOTE — Progress Notes (Signed)
Pt's wife will take pt home with 24 hrs care.

## 2014-09-30 LAB — CULTURE, BLOOD (ROUTINE X 2)
CULTURE: NO GROWTH
Culture: NO GROWTH

## 2014-10-03 ENCOUNTER — Ambulatory Visit (INDEPENDENT_AMBULATORY_CARE_PROVIDER_SITE_OTHER): Payer: Medicare Other | Admitting: Adult Health

## 2014-10-03 ENCOUNTER — Encounter: Payer: Self-pay | Admitting: Adult Health

## 2014-10-03 VITALS — BP 120/80 | Temp 98.7°F | Ht 68.0 in | Wt 156.7 lb

## 2014-10-03 DIAGNOSIS — N390 Urinary tract infection, site not specified: Secondary | ICD-10-CM

## 2014-10-03 DIAGNOSIS — Z09 Encounter for follow-up examination after completed treatment for conditions other than malignant neoplasm: Secondary | ICD-10-CM | POA: Diagnosis not present

## 2014-10-03 DIAGNOSIS — L821 Other seborrheic keratosis: Secondary | ICD-10-CM

## 2014-10-03 DIAGNOSIS — R4182 Altered mental status, unspecified: Secondary | ICD-10-CM | POA: Diagnosis not present

## 2014-10-03 NOTE — Patient Instructions (Addendum)
I am glad you are feeling better after your recent hospital stay. Please follow up with your PCP as needed. It was great meeting you today!

## 2014-10-03 NOTE — Progress Notes (Signed)
Subjective:    Patient ID: Mike Williams, male    DOB: [birth year], 81 y.o.   MRN: [id number]  HPI   Mike Williams in in the office today for follow up regarding recent admission at [facility] for UTI.   Per D/C note: 81 year old male patient with history of advanced dementia, CVA, HTN, lives with his spouse, admitted to the hospital on 09/23/14 with few days' history of progressive altered mental status (not sleeping well, behaving strangely-spread cereal out on the table, throwing sausage up in the air) and found to have UTI. Mental status improving with treatment.  The wife is with the patient today and states that they have finished ABX and that he is back to baseline. He is sleeping a lot.   Denies fever, UTI type symptoms or any other issues at this time.   He does have a spot on his back that his wife in concerned with. She states that it has been there " for a long time."     Review of Systems  Constitutional: Positive for fatigue. Negative for fever, chills, diaphoresis, activity change and unexpected weight change.  Genitourinary: Negative for dysuria, urgency, frequency, flank pain, decreased urine volume and difficulty urinating.  All other systems reviewed and are negative.  Past Medical History  Diagnosis Date  . CEREBROVASCULAR ACCIDENT, HX OF 02/09/2007  . DEMENTIA 05/12/2007  . HYPERGLYCEMIA 09/21/2009  . HYPERTENSION 12/29/2006  . OBSTRUCTIVE SLEEP APNEA 12/29/2006  . PROSTATE CANCER, HX OF 12/29/2006  . Hemorrhoids   . Lactose intolerance   . Risk for falls   . Prostatitis   . Aortic root dilatation     Noted on 09/2010 echo, with mild-mod AI  . Seizures     History   Social History  . Marital Status: Married    Spouse Name: N/A  . Number of Children: N/A  . Years of Education: N/A   Occupational History  . Not on file.   Social History Main Topics  . Smoking status: Never Smoker   . Smokeless tobacco: Never Used  . Alcohol Use: No  . Drug Use: No  . Sexual  Activity: Not on file   Other Topics Concern  . Not on file   Social History Narrative   Married 52 years in 2016 to wife Mike Williams (goes to [place] urgent care). 2 grown sons. 2 grandkids-boy and girl.       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     Past Surgical History  Procedure Laterality Date  . Cholecystectomy    . Prostate surgery      cryotherapy  . Ganglion cyst excision      Family History  Problem Relation Age of Onset  . Heart attack Father   . Heart disease Father   . Cancer Sister     No Known Allergies  Current Outpatient Prescriptions on File Prior to Visit  Medication Sig Dispense Refill  . clopidogrel (PLAVIX) 75 MG tablet Take 1 tablet (75 mg total) by mouth daily. 90 tablet 1  . darifenacin (ENABLEX) 7.5 MG 24 hr tablet Take 1 tablet (7.5 mg total) by mouth daily. 90 tablet 3  . felodipine (PLENDIL) 5 MG 24 hr tablet Take 1 tablet (5 mg total) by mouth daily. 90 tablet 1  . hydrALAZINE (APRESOLINE) 50 MG tablet Take 1 tablet (50 mg total) by mouth 3 (three) times daily. 270 tablet 1  . levETIRAcetam (KEPPRA) 250 MG tablet Take 1 tablet (250 mg total) by mouth  2 (two) times daily. 60 tablet 3  . olmesartan (BENICAR) 20 MG tablet Take 1 tablet (20 mg total) by mouth daily. 30 tablet 3   No current facility-administered medications on file prior to visit.    BP 120/80 mmHg  Temp(Src) 98.7 F (37.1 C) (Oral)  Ht 5\' 8"  (1.727 m)  Wt 156 lb 11.2 oz (71.079 kg)  BMI 23.83 kg/m2      Objective:   Physical Exam  Constitutional: He appears well-developed and well-nourished. No distress.  Cardiovascular: Normal rate, regular rhythm, normal heart sounds and intact distal pulses.  Exam reveals no gallop and no friction rub.   No murmur heard. Pulmonary/Chest: Effort normal and breath sounds normal. No respiratory distress. He has no wheezes. He has no rales. He exhibits no tenderness.  Neurological: He is alert.  Does not speak but will follow  commands.   Skin: Skin is warm and dry. He is not diaphoretic.  Has a spot on his back approx 2cm x 1.5cm. . It appears waxy and brown. Seborrhoeic keratose in nature.   Psychiatric: He has a normal mood and affect. His behavior is normal. Judgment and thought content normal.  Vitals reviewed.      Assessment & Plan:  1. Hospital discharge follow-up - Has finished ABX course.  - Is back to baseline.  - Follow up as needed.

## 2014-10-05 ENCOUNTER — Encounter: Payer: Self-pay | Admitting: Family Medicine

## 2014-10-11 ENCOUNTER — Telehealth: Payer: Self-pay | Admitting: Family Medicine

## 2014-10-11 NOTE — Telephone Encounter (Signed)
Wife would like to know if you could mail that letter for pt to her home. Thanks

## 2014-10-12 NOTE — Telephone Encounter (Signed)
Letter mailed to pt.  

## 2014-10-31 ENCOUNTER — Ambulatory Visit (INDEPENDENT_AMBULATORY_CARE_PROVIDER_SITE_OTHER): Payer: Medicare Other | Admitting: Neurology

## 2014-10-31 ENCOUNTER — Encounter: Payer: Self-pay | Admitting: Neurology

## 2014-10-31 VITALS — BP 146/77 | HR 78 | Ht 67.0 in | Wt 156.0 lb

## 2014-10-31 DIAGNOSIS — F0280 Dementia in other diseases classified elsewhere without behavioral disturbance: Secondary | ICD-10-CM

## 2014-10-31 DIAGNOSIS — G309 Alzheimer's disease, unspecified: Secondary | ICD-10-CM

## 2014-10-31 DIAGNOSIS — R569 Unspecified convulsions: Secondary | ICD-10-CM | POA: Diagnosis not present

## 2014-10-31 NOTE — Patient Instructions (Signed)
Alzheimer Disease Alzheimer disease is a mental disorder. It causes memory loss and loss of other mental functions, such as learning, thinking, problem solving, communicating, and completing tasks. The mental losses interfere with the ability to perform daily activities at work, at home, or in social situations. Alzheimer disease usually starts in a person's late 60s or early 70s but can start earlier in life (familial form). The mental changes caused by this disease are permanent and worsen over time. As the illness progresses, the ability to do even the simplest things is lost. Survival with Alzheimer disease ranges from several years to as long as 20 years. CAUSES Alzheimer disease is caused by abnormally high levels of a protein (beta-amyloid) in the brain. This protein forms very small deposits within and around the brain's nerve cells. These deposits prevent the nerve cells from working properly. Experts are not certain what causes the beta-amyloid deposits in this disease. RISK FACTORS The following major risk factors have been identified:  Increasing age.  Certain genetic variations, such as Down syndrome (trisomy 21). SYMPTOMS In the early stages of Alzheimer disease, you are still able to perform daily activities but need greater effort, more time, or memory aids. Early symptoms include:  Mild memory loss of recent events, names, or phone numbers.  Loss of objects.  Minor loss of vocabulary.  Difficulty with complex tasks, such as paying bills or driving in unfamiliar locations. Other mental functions deteriorate as the disease worsens. These changes slowly go from mild to severe. Symptoms at this stage include:  Difficulty remembering. You may not be able to recall personal information such as your address and telephone number. You may become confused about the date, the season of the year, or your location.  Difficulty maintaining attention. You may forget what you wanted to say  during conversations and repeat what you have already said.  Difficulty learning new information or tasks. You may not remember what you read or the name of a new friend you met.  Difficulty counting or doing math. You may have difficulty with complex math problems. You may make mistakes in paying bills or managing your checkbook.  Poor reasoning and judgment. You may make poor decisions or not dress right for the weather.  Difficulty communicating. You may have regular difficulty remembering words, naming objects, expressing yourself clearly, or writing sentences that make sense.  Difficulty performing familiar daily activities. You may get lost driving in familiar locations or need help eating, bathing, dressing, grooming, or using the toilet. You may have difficulty maintaining bladder or bowel control.  Difficulty recognizing familiar faces. You may confuse family members or close friends with one another. You may not recognize a close relative or may mistake strangers for family. Alzheimer disease also may cause changes in personality and behavior. These changes include:   Loss of interest or motivation.  Social withdrawal.  Anxiety.  Difficulty sleeping.  Uncharacteristic anger or combativeness.  A false belief that someone is trying to harm you (paranoia).  Seeing things that are not real (hallucinations).  Agitation. Confusion and disruptive behavior are often worse at night and may be triggered by changes in the environment or acute medical issues. DIAGNOSIS  Alzheimer disease is diagnosed through an assessment by your health care provider. During this assessment, your health care provider will do the following:  Ask you and your family, friends, or caregivers questions about your symptoms, their frequency, their duration and progression, and the effect they are having on your life.    Ask questions about your personal and family medical history and use of alcohol or drugs,  including prescription medicine.  Perform a physical exam and order blood tests and brain imaging exams. Your health care provider may refer you to a specialist for detailed evaluation of your mental functions (neuropsychological testing).  Many different brain disorders, medical conditions, and certain substances can cause symptoms that resemble Alzheimer disease symptoms. These must be ruled out before this disease can be diagnosed. If Alzheimer disease is diagnosed, it will be considered either "possible" or "probable" Alzheimer disease. "Possible" Alzheimer disease means that your symptoms are typical of the disease and no other disorder is causing them. "Probable" Alzheimer disease means that you also have a family history of the disease or genetic test results that support the diagnosis. Certain tests, mostly used in research studies, are highly specific for Alzheimer disease.  TREATMENT  There is currently no cure for this disease. The goals of treatment are to:  Slow down the progression of the disease.  Preserve mental function as long as possible.  Manage behavioral symptoms.  Make life easier for the person with Alzheimer disease and his or her caregivers. The following treatment options are available:  Medicine. Certain medicines may help slow memory loss by changing the level of certain chemicals in the brain. Medicine may also help with behavioral symptoms.  Talk therapy. Talk therapy provides education, support, and memory aids for people with this disease. It is most effective in the early stages of the illness.  Caregiving. Caregivers may be family members, friends, or trained medical professionals. They help the person with Alzheimer disease with daily life activities. Caregiving may take place at home or at a nursing facility.  Family support groups. These provide education, emotional support, and information about community resources to family members who are taking care of  the person with this disease. Document Released: 01/01/2004 Document Revised: 09/05/2013 Document Reviewed: 08/27/2012 ExitCare Patient Information 2015 [employer]. This information is not intended to replace advice given to you by your health care provider. Make sure you discuss any questions you have with your health care provider.  

## 2014-10-31 NOTE — Progress Notes (Signed)
Reason for visit: Alzheimer's disease  Mike Williams is an 81 y.o. male  History of present illness:  Mike Williams is a 81 year old right-handed black male with a history of a progressive dementia associated with Alzheimer's disease. The patient has recently been in the hospital on 09/23/2014 with altered mental status, found to have a urinary tract infection. The patient is back to his usual baseline. The patient was last seen through this office in January 2015. The patient has a 9 year history of a progressive dementia. He is now minimally verbal. He requires assistance with all activities of daily living with exception of feeding. He is eating drinking fairly well. He uses a walker for ambulation, and he has not had any falls in over 2 months. At times he will be up all night going into drawers, but he no longer is a risk for wandering. In general, he sleeps fairly well, he has no significant problems with agitation. He returns to this office for an evaluation. He does have a history of seizures, no recurrence of seizures has been noted recently, he remains on low-dose Keppra.  Past Medical History  Diagnosis Date  . CEREBROVASCULAR ACCIDENT, HX OF 02/09/2007  . DEMENTIA 05/12/2007  . HYPERGLYCEMIA 09/21/2009  . HYPERTENSION 12/29/2006  . OBSTRUCTIVE SLEEP APNEA 12/29/2006  . PROSTATE CANCER, HX OF 12/29/2006  . Hemorrhoids   . Lactose intolerance   . Risk for falls   . Prostatitis   . Aortic root dilatation     Noted on 09/2010 echo, with mild-mod AI  . Seizures     Past Surgical History  Procedure Laterality Date  . Cholecystectomy    . Prostate surgery      cryotherapy  . Ganglion cyst excision      Family History  Problem Relation Age of Onset  . Heart attack Father   . Heart disease Father   . Cancer Sister     Social history:  reports that he has never smoked. He has never used smokeless tobacco. He reports that he does not drink alcohol or use illicit drugs.   No Known  Allergies  Medications:  Prior to Admission medications   Medication Sig Start Date End Date Taking? Authorizing Provider  clopidogrel (PLAVIX) 75 MG tablet Take 1 tablet (75 mg total) by mouth daily. 02/14/14  Yes Eulas Post, MD  darifenacin (ENABLEX) 7.5 MG 24 hr tablet Take 1 tablet (7.5 mg total) by mouth daily. 06/29/14  Yes Marin Olp, MD  felodipine (PLENDIL) 5 MG 24 hr tablet Take 1 tablet (5 mg total) by mouth daily. 06/29/14  Yes Marin Olp, MD  hydrALAZINE (APRESOLINE) 50 MG tablet Take 1 tablet (50 mg total) by mouth 3 (three) times daily. 06/29/14  Yes Marin Olp, MD  levETIRAcetam (KEPPRA) 250 MG tablet Take 1 tablet (250 mg total) by mouth 2 (two) times daily. 06/29/14  Yes Marin Olp, MD  olmesartan (BENICAR) 20 MG tablet Take 1 tablet (20 mg total) by mouth daily. 06/29/14  Yes Marin Olp, MD    ROS:  Out of a complete 14 system review of symptoms, the patient complains only of the following symptoms, and all other reviewed systems are negative.  Memory loss, seizures, speech difficulty, weakness, facial drooping Gait disorder  Blood pressure 146/77, pulse 78, height 5\' 7"  (1.702 m), weight 156 lb (70.761 kg).  Physical Exam  General: The patient is alert and cooperative at the time of the examination.  Skin: No significant peripheral edema is noted.   Neurologic Exam  Mental status: The patient is alert and cooperative, minimally verbal. Mini-Mental Status Examination was attempted, the patient was unable to participate.   Cranial nerves: Facial symmetry is present. Speech is minimal, slightly dysarthric. Extraocular movements are full. Visual fields are full to threat.  Motor: The patient has good strength in all 4 extremities.  Sensory examination: Soft touch sensation is symmetric on the face, arms, and legs.  Coordination: The patient has good finger-nose-finger and heel-to-shin bilaterally. The patient has surprisingly  little apraxia with the use of the extremities.  Gait and station: The patient has a slightly wide-based gait. Patient uses a walker for ambulation. Tandem gait was not attempted.  Reflexes: Deep tendon reflexes are symmetric.   Assessment/Plan:  1. Progressive dementia  2. History of seizures  3. Gait disorder  The patient will remain on Keppra for now. He is not getting any medications through this office. I will have the patient come back on an as-needed basis at this time. The patient has end-stage dementia.  Jill Alexanders MD 10/31/2014 3:35 PM  [facility] [address] [place], [place] [address]  Phone [phone] Fax [phone]

## 2014-11-15 ENCOUNTER — Other Ambulatory Visit: Payer: Self-pay | Admitting: Internal Medicine

## 2014-11-16 ENCOUNTER — Other Ambulatory Visit: Payer: Self-pay | Admitting: Family Medicine

## 2014-11-16 MED ORDER — LEVETIRACETAM 250 MG PO TABS: 250.0000 mg | ORAL_TABLET | Freq: Two times a day (BID) | ORAL | Status: DC

## 2014-11-16 NOTE — Telephone Encounter (Signed)
Sent to the pharmacy for 30 days.  Pt now due for follow up with Dr. Yong Channel.  Last note from 06/29/14 states to follow up in 4 months.  Also advised neuro follow up in four months.  Did not want the pt to run out of medication.

## 2014-11-22 ENCOUNTER — Telehealth: Payer: Self-pay | Admitting: *Deleted

## 2014-11-22 MED ORDER — LEVETIRACETAM 250 MG PO TABS: 250.0000 mg | ORAL_TABLET | Freq: Two times a day (BID) | ORAL | Status: DC

## 2014-11-22 NOTE — Telephone Encounter (Signed)
Patient is requesting a refill of levETIRAcetam (KEPPRA) 250 MG tablet [place]

## 2014-11-22 NOTE — Telephone Encounter (Signed)
Medication refilled

## 2014-11-23 ENCOUNTER — Other Ambulatory Visit: Payer: Self-pay | Admitting: Internal Medicine

## 2015-01-11 ENCOUNTER — Ambulatory Visit (INDEPENDENT_AMBULATORY_CARE_PROVIDER_SITE_OTHER): Payer: Medicare Other | Admitting: Family Medicine

## 2015-01-11 ENCOUNTER — Encounter: Payer: Self-pay | Admitting: Family Medicine

## 2015-01-11 VITALS — BP 143/80 | HR 82 | Temp 97.8°F | Wt 151.0 lb

## 2015-01-11 DIAGNOSIS — G309 Alzheimer's disease, unspecified: Secondary | ICD-10-CM

## 2015-01-11 DIAGNOSIS — Z23 Encounter for immunization: Secondary | ICD-10-CM | POA: Diagnosis not present

## 2015-01-11 DIAGNOSIS — F0280 Dementia in other diseases classified elsewhere without behavioral disturbance: Secondary | ICD-10-CM

## 2015-01-11 DIAGNOSIS — I10 Essential (primary) hypertension: Secondary | ICD-10-CM

## 2015-01-11 DIAGNOSIS — Z8673 Personal history of transient ischemic attack (TIA), and cerebral infarction without residual deficits: Secondary | ICD-10-CM | POA: Diagnosis not present

## 2015-01-11 NOTE — Assessment & Plan Note (Signed)
Stroke risk off plavix but considering stopping.

## 2015-01-11 NOTE — Patient Instructions (Addendum)
We are holding onto [facility] facility contact us when you decide who to use.   We discussed considering removing meds like blood pressure and plavix- would increase heart attack and stroke risk- but balance here is that this may be preferred over continued decline from Alzheimers

## 2015-01-11 NOTE — Assessment & Plan Note (Signed)
S: reasonable control per JNC 8 but above typical goal <140/90 BP Readings from Last 3 Encounters:  01/11/15 143/80  10/31/14 146/77  10/03/14 120/80  A/P: We had a long discussion today about potentially removing BP meds as well as plavix. There is a potential risk for heart attack stroke but we balanced this with likely continued decline from alzheimers in regards to cause of end of life. Wife is to discuss with family but we will consider removing BP medicines 1 by 1 to improve quality of life by decreasing pill burden and potentially leading to preferred end of life option.

## 2015-01-11 NOTE — Progress Notes (Signed)
Garret Reddish, MD  Subjective:  Mike Williams is a 81 y.o. year old very pleasant male patient who presents for/with See problem oriented charting ROS- level 5 caveat applies due to dementia  Past Medical History-  Patient Active Problem List   Diagnosis Date Noted  . Do not resuscitate 09/27/2013    Priority: High  . Seizures 02/01/2013    Priority: High  . Alzheimer's dementia 05/12/2007    Priority: High  . Aortic insufficiency 07/04/2011    Priority: Medium  . History of CVA (cerebrovascular accident) 02/09/2007    Priority: Medium  . Essential hypertension 12/29/2006    Priority: Medium  . Overactive bladder 06/29/2014    Priority: Low  . Chronic renal insufficiency, stage I 10/11/2010    Priority: Low  . Hyperglycemia 09/21/2009    Priority: Low  . Obstructive sleep apnea 12/29/2006    Priority: Low  . PROSTATE CANCER, HX OF 12/29/2006    Priority: Low  . Altered mental status 09/23/2014  . UTI (lower urinary tract infection) 09/23/2014    Medications- reviewed and updated Current Outpatient Prescriptions  Medication Sig Dispense Refill  . clopidogrel (PLAVIX) 75 MG tablet take 1 tablet by mouth once daily 90 tablet 3  . darifenacin (ENABLEX) 7.5 MG 24 hr tablet Take 1 tablet (7.5 mg total) by mouth daily. 90 tablet 3  . felodipine (PLENDIL) 5 MG 24 hr tablet Take 1 tablet (5 mg total) by mouth daily. 90 tablet 1  . hydrALAZINE (APRESOLINE) 50 MG tablet Take 1 tablet (50 mg total) by mouth 3 (three) times daily. 270 tablet 1  . levETIRAcetam (KEPPRA) 250 MG tablet Take 1 tablet (250 mg total) by mouth 2 (two) times daily. 60 tablet 5  . olmesartan (BENICAR) 20 MG tablet Take 1 tablet (20 mg total) by mouth daily. 30 tablet 3   No current facility-administered medications for this visit.    Objective: BP 143/80 mmHg  Pulse 82  Temp(Src) 97.8 F (36.6 C)  Wt 151 lb (68.493 kg) Gen: NAD, resting comfortably, obeys simple commands like sticking out tongue, does  not verbally respond CV: RRR no murmurs rubs or gallops Lungs: CTAB no crackles, wheeze, rhonchi Abdomen: soft/nontender/nondistended/normal bowel sounds Ext: trace edema Skin: warm, dry Neuro: grossly normal, moves all extremities  Assessment/Plan:  Essential hypertension S: reasonable control per JNC 8 but above typical goal <140/90 BP Readings from Last 3 Encounters:  01/11/15 143/80  10/31/14 146/77  10/03/14 120/80  A/P: We had a long discussion today about potentially removing BP meds as well as plavix. There is a potential risk for heart attack stroke but we balanced this with likely continued decline from alzheimers in regards to cause of end of life. Wife is to discuss with family but we will consider removing BP medicines 1 by 1 to improve quality of life by decreasing pill burden and potentially leading to preferred end of life option.    Alzheimer's dementia S:Advanced. No longer on medications for dementia. Wife aware immunizations/bloodwork likely very low yield. Question benefit of BP meds as well as well as plavix A/P: would likely keep enablex, and keppra for quality of life, but may remove other meds. Wife brings FL2 and is looking into memory care units. Patient will put food to his mouth with encouragement but is otherwise full assist. Physically can walk with walker. We filled out portions of FL2 able and will assist wife as she comes to decide which facility to use. Bevelyn Ngo to store these  records.    History of CVA (cerebrovascular accident) Stroke risk off plavix but considering stopping.   Follow up as needed to assist with FL2, minimizing visits to improve quality of life.   Discussed yield of flu and pneumonia shot also unclear but opted to get these today Orders Placed This Encounter  Procedures  . Flu Vaccine QUAD 36+ mos IM  . Pneumococcal conjugate vaccine 13-valent   >50% of 30 minute office visit was spent on counseling (considering stopping medicine,  considering next steps for patient) and coordination of care (Discussing FL2)

## 2015-01-11 NOTE — Assessment & Plan Note (Signed)
S:Advanced. No longer on medications for dementia. Wife aware immunizations/bloodwork likely very low yield. Question benefit of BP meds as well as well as plavix A/P: would likely keep enablex, and keppra for quality of life, but may remove other meds. Wife brings FL2 and is looking into memory care units. Patient will put food to his mouth with encouragement but is otherwise full assist. Physically can walk with walker. We filled out portions of FL2 able and will assist wife as she comes to decide which facility to use. Bevelyn Ngo to store these records.

## 2015-01-30 ENCOUNTER — Other Ambulatory Visit: Payer: Self-pay | Admitting: Family Medicine

## 2015-02-01 DIAGNOSIS — N200 Calculus of kidney: Secondary | ICD-10-CM | POA: Diagnosis not present

## 2015-04-14 ENCOUNTER — Other Ambulatory Visit: Payer: Self-pay | Admitting: Family Medicine

## 2015-04-25 ENCOUNTER — Telehealth: Payer: Self-pay | Admitting: Family Medicine

## 2015-04-25 NOTE — Telephone Encounter (Signed)
Carmelina Paddock from [facility] called.  She needs an updated FL2 (last one dated 11/3).  Also needs to see if pt has had a TB skin test in the last 30 days.  Carmelina Paddock stated she will call the front desk to get fax number to send paper work.  Would like a call back.

## 2015-04-26 NOTE — Telephone Encounter (Signed)
Called and lm for Janee to return my call regarding the need of an updated FL2 form.

## 2015-05-09 NOTE — Telephone Encounter (Signed)
Called and left Janee another vm regarding FL2 paperwork.

## 2015-05-22 ENCOUNTER — Encounter: Payer: Self-pay | Admitting: Family Medicine

## 2015-05-22 ENCOUNTER — Ambulatory Visit (INDEPENDENT_AMBULATORY_CARE_PROVIDER_SITE_OTHER): Payer: Medicare Other | Admitting: Family Medicine

## 2015-05-22 VITALS — BP 122/80 | HR 71 | Temp 98.3°F | Wt 151.0 lb

## 2015-05-22 DIAGNOSIS — L989 Disorder of the skin and subcutaneous tissue, unspecified: Secondary | ICD-10-CM

## 2015-05-22 DIAGNOSIS — Z0000 Encounter for general adult medical examination without abnormal findings: Secondary | ICD-10-CM

## 2015-05-22 NOTE — Patient Instructions (Signed)
We will call you within a week about your referral to dermatology. If you do not hear within 2 weeks, give [place] a call.  Hopeful they can see you within a month.   Come back on Thursday after noon to have Keba check the TB test

## 2015-05-22 NOTE — Progress Notes (Signed)
Garret Reddish, MD  Subjective:  Mike Williams is a 82 y.o. year old very pleasant male patient who presents for/with See problem oriented charting ROS- level 5 caveat applies Wife reports no new medicine, fever, chills, nausea, vomiting.   Past Medical History-  Patient Active Problem List   Diagnosis Date Noted  . Do not resuscitate 09/27/2013    Priority: High  . Seizures ([place]) 02/01/2013    Priority: High  . Alzheimer's dementia 05/12/2007    Priority: High  . Aortic insufficiency 07/04/2011    Priority: Medium  . History of CVA (cerebrovascular accident) 02/09/2007    Priority: Medium  . Essential hypertension 12/29/2006    Priority: Medium  . Overactive bladder 06/29/2014    Priority: Low  . Chronic renal insufficiency, stage I 10/11/2010    Priority: Low  . Hyperglycemia 09/21/2009    Priority: Low  . Obstructive sleep apnea 12/29/2006    Priority: Low  . PROSTATE CANCER, HX OF 12/29/2006    Priority: Low  . Altered mental status 09/23/2014  . UTI (lower urinary tract infection) 09/23/2014    Medications- reviewed and updated Current Outpatient Prescriptions  Medication Sig Dispense Refill  . BENICAR 20 MG tablet take 1 tablet by mouth once daily 30 tablet 6  . clopidogrel (PLAVIX) 75 MG tablet take 1 tablet by mouth once daily 90 tablet 3  . darifenacin (ENABLEX) 7.5 MG 24 hr tablet Take 1 tablet (7.5 mg total) by mouth daily. 90 tablet 3  . felodipine (PLENDIL) 5 MG 24 hr tablet take 1 tablet by mouth once daily 90 tablet 3  . hydrALAZINE (APRESOLINE) 50 MG tablet take 1 tablet by mouth three times a day 270 tablet 3  . levETIRAcetam (KEPPRA) 250 MG tablet Take 1 tablet (250 mg total) by mouth 2 (two) times daily. 60 tablet 5   No current facility-administered medications for this visit.    Objective: BP 122/80 mmHg  Pulse 71  Temp(Src) 98.3 F (36.8 C)  Wt 151 lb (68.493 kg) Gen: NAD, resting comfortably CV: RRR no murmurs rubs or gallops Lungs: CTAB  no crackles, wheeze, rhonchi Abdomen: soft/nontender/nondistended/normal bowel sounds.  Ext: no edema Skin: warm, dry On back has a hyperpigmented area at least 3 x 4 cm in middle of back. Has several other hyperpigmented areas on left back all 1-2 cm or less in diameter (about 3 spots). The largest 2 spots have some scale noted on them Neuro: grossly normal, moves all extremities  Assessment/Plan:  Skin lesion of back - Plan: Ambulatory referral to Dermatology S: Skin lesion on mid back apparently for years monitored by Dr. Leanne Chang previously. Over last few months, wife has noted a doubling in size as well as several other areas. Each area is hyperpigmented and the larger areas have a scale on portions of them. No history of skin cancer. Is african [place]. Wife states she has not seen him trying to scratch area and there is no pain when palpated A/P: We had a discussion about continuing to monitor given his advanced dementia or trialing a topical steroid. We also discussed doing punch biopsy. Wife would prefer dermatology visit and we have referred at this time. Doubt melanoma but would ask for dermatology opinion (main concern being central mid back lesion). Peripheral lesions of unclear origin as well.    Orders Placed This Encounter  Procedures  . Ambulatory referral to Dermatology    Referral Priority:  Routine    Referral Type:  Consultation  Referral Reason:  Specialty Services Required    Requested Specialty:  Dermatology    Number of Visits Requested:  1  . PPD    Order Specific Question:  Has patient ever tested positive?    Answer:  No  PPD done for FL2- return Thursday after noon

## 2015-05-29 ENCOUNTER — Telehealth: Payer: Self-pay

## 2015-05-29 NOTE — Telephone Encounter (Signed)
FL2 form faxed to [facility] along with PPD.

## 2015-06-05 DIAGNOSIS — D485 Neoplasm of uncertain behavior of skin: Secondary | ICD-10-CM | POA: Diagnosis not present

## 2015-06-05 DIAGNOSIS — L309 Dermatitis, unspecified: Secondary | ICD-10-CM | POA: Diagnosis not present

## 2015-06-05 DIAGNOSIS — L720 Epidermal cyst: Secondary | ICD-10-CM | POA: Diagnosis not present

## 2015-06-21 ENCOUNTER — Other Ambulatory Visit: Payer: Self-pay

## 2015-06-21 MED ORDER — LEVETIRACETAM 250 MG PO TABS: 250.0000 mg | ORAL_TABLET | Freq: Two times a day (BID) | ORAL | Status: DC

## 2015-07-04 ENCOUNTER — Telehealth: Payer: Self-pay | Admitting: Family Medicine

## 2015-07-04 ENCOUNTER — Non-Acute Institutional Stay (SKILLED_NURSING_FACILITY): Payer: Medicare Other | Admitting: Adult Health

## 2015-07-04 ENCOUNTER — Encounter: Payer: Self-pay | Admitting: Adult Health

## 2015-07-04 DIAGNOSIS — N3281 Overactive bladder: Secondary | ICD-10-CM | POA: Diagnosis not present

## 2015-07-04 DIAGNOSIS — R569 Unspecified convulsions: Secondary | ICD-10-CM

## 2015-07-04 DIAGNOSIS — F0280 Dementia in other diseases classified elsewhere without behavioral disturbance: Secondary | ICD-10-CM

## 2015-07-04 DIAGNOSIS — G309 Alzheimer's disease, unspecified: Secondary | ICD-10-CM | POA: Diagnosis not present

## 2015-07-04 DIAGNOSIS — I10 Essential (primary) hypertension: Secondary | ICD-10-CM | POA: Diagnosis not present

## 2015-07-04 DIAGNOSIS — R2689 Other abnormalities of gait and mobility: Secondary | ICD-10-CM | POA: Diagnosis not present

## 2015-07-04 NOTE — Progress Notes (Signed)
Patient ID: Mike Williams, male   DOB: 21-Jun-1932, 82 y.o.   MRN: [id number]   Facility:  [facility]       No Known Allergies   Chief Complaint  Patient presents with  . Acute Visit    follow up transfer     HPI:  He is coming from home as his wife can no longer manage his care at home. He cannot fully participate in the hpi or ros.  He is here for short term rehab with his goal to return back home as soon as he able. At this time I am not certain if going home will be an option for him.    Past Medical History  Diagnosis Date  . CEREBROVASCULAR ACCIDENT, HX OF 02/09/2007  . DEMENTIA 05/12/2007  . HYPERGLYCEMIA 09/21/2009  . HYPERTENSION 12/29/2006  . OBSTRUCTIVE SLEEP APNEA 12/29/2006  . PROSTATE CANCER, HX OF 12/29/2006  . Hemorrhoids   . Lactose intolerance   . Risk for falls   . Prostatitis   . Aortic root dilatation ([place])     Noted on 09/2010 echo, with mild-mod AI  . Seizures [facility])     Past Surgical History  Procedure Laterality Date  . Cholecystectomy    . Prostate surgery      cryotherapy  . Ganglion cyst excision      VITAL SIGNS BP 122/70 mmHg  Pulse 72  Temp(Src) 98.1 F (36.7 C) (Oral)  Resp 18  Patient's Medications  New Prescriptions   No medications on file  Previous Medications   BENICAR 20 MG TABLET    take 1 tablet by mouth once daily   CLOPIDOGREL (PLAVIX) 75 MG TABLET    take 1 tablet by mouth once daily   DARIFENACIN (ENABLEX) 7.5 MG 24 HR TABLET    Take 1 tablet (7.5 mg total) by mouth daily.   FELODIPINE (PLENDIL) 5 MG 24 HR TABLET    take 1 tablet by mouth once daily   HYDRALAZINE (APRESOLINE) 50 MG TABLET    take 1 tablet by mouth three times a day   LEVETIRACETAM (KEPPRA) 250 MG TABLET    Take 1 tablet (250 mg total) by mouth 2 (two) times daily.  Modified Medications   No medications on file  Discontinued Medications   No medications on file     SIGNIFICANT DIAGNOSTIC EXAMS  None available  Review of Systems  Unable to perform  ROS: dementia    Physical Exam  Constitutional: No distress.  Eyes: Conjunctivae are normal.  Neck: Neck supple. No JVD present. No thyromegaly present.  Cardiovascular: Normal rate, regular rhythm and intact distal pulses.   Respiratory: Effort normal and breath sounds normal. No respiratory distress. He has no wheezes.  GI: Soft. Bowel sounds are normal. He exhibits no distension. There is no tenderness.  Musculoskeletal: He exhibits no edema.  Able to move all extremities   Lymphadenopathy:    He has no cervical adenopathy.  Neurological: He is alert.  Skin: Skin is warm and dry. He is not diaphoretic.  Psychiatric: He has a normal mood and affect.        ASSESSMENT/ PLAN:  1. Hypertension: will continue benicar 20 mg daily; apresoline  50 mg three times daily and plendil 5 mg daily   2. Seizure: no reports of seizure present will continue keppra 250 mg twice daily   3. UI: will continue enablex 7.5 mg daily  4. CVA: is neurologically stable will continue plavix 75 m daily  5. Alzheimer's disease: is without significant change in status; is presently not on medications; will not make changes at this time; will monitor    Time spent with patient  50  minutes >50% time spent counseling; reviewing medical record; tests; labs; and developing future plan of care   Ok Edwards NP [facility]  Contact [phone] Monday through Friday 8am- 5pm  After hours call [phone]

## 2015-07-04 NOTE — Telephone Encounter (Signed)
Bard Herbert calling from [facility] stated she has a conflict with patient medication please call [phone]

## 2015-07-05 ENCOUNTER — Encounter: Payer: Self-pay | Admitting: Internal Medicine

## 2015-07-05 ENCOUNTER — Non-Acute Institutional Stay (SKILLED_NURSING_FACILITY): Payer: Medicare Other | Admitting: Internal Medicine

## 2015-07-05 DIAGNOSIS — R2689 Other abnormalities of gait and mobility: Secondary | ICD-10-CM | POA: Diagnosis not present

## 2015-07-05 DIAGNOSIS — N3281 Overactive bladder: Secondary | ICD-10-CM

## 2015-07-05 DIAGNOSIS — F445 Conversion disorder with seizures or convulsions: Secondary | ICD-10-CM | POA: Diagnosis not present

## 2015-07-05 DIAGNOSIS — Z8673 Personal history of transient ischemic attack (TIA), and cerebral infarction without residual deficits: Secondary | ICD-10-CM

## 2015-07-05 DIAGNOSIS — R569 Unspecified convulsions: Secondary | ICD-10-CM

## 2015-07-05 DIAGNOSIS — I10 Essential (primary) hypertension: Secondary | ICD-10-CM

## 2015-07-05 DIAGNOSIS — G309 Alzheimer's disease, unspecified: Secondary | ICD-10-CM | POA: Diagnosis not present

## 2015-07-05 DIAGNOSIS — F0280 Dementia in other diseases classified elsewhere without behavioral disturbance: Secondary | ICD-10-CM | POA: Diagnosis not present

## 2015-07-05 DIAGNOSIS — G4733 Obstructive sleep apnea (adult) (pediatric): Secondary | ICD-10-CM | POA: Diagnosis not present

## 2015-07-05 DIAGNOSIS — I6990 Unspecified sequelae of unspecified cerebrovascular disease: Secondary | ICD-10-CM | POA: Diagnosis not present

## 2015-07-05 LAB — CBC AND DIFFERENTIAL
HEMATOCRIT: 41 % (ref 41–53)
HEMOGLOBIN: 13.8 g/dL (ref 13.5–17.5)
PLATELETS: 236 10*3/uL (ref 150–399)
WBC: 6.6 10*3/mL

## 2015-07-05 LAB — BASIC METABOLIC PANEL
BUN: 13 mg/dL (ref 4–21)
Creatinine: 0.9 mg/dL (ref 0.6–1.3)
Glucose: 86 mg/dL
POTASSIUM: 4.1 mmol/L (ref 3.4–5.3)
Sodium: 144 mmol/L (ref 137–147)

## 2015-07-05 LAB — HEPATIC FUNCTION PANEL
ALT: 7 U/L — AB (ref 10–40)
AST: 12 U/L — AB (ref 14–40)
Alkaline Phosphatase: 82 U/L (ref 25–125)
Bilirubin, Total: 0.4 mg/dL

## 2015-07-05 NOTE — Assessment & Plan Note (Addendum)
Ebablix I consider a comfort med and helps avoid UTIs; will continue

## 2015-07-05 NOTE — Progress Notes (Deleted)
MRN: [id number] Name: Mike Williams  Sex: male Age: 82 y.o. DOB: [birth year]  [place] #: Karren Burly Facility/Room:110 Level Of Care: SNF Provider: Inocencio Homes D Emergency Contacts: Extended Emergency Contact Information Primary Emergency Contact: Manthey,[name] Address: [address]          [place], [place] [address] [place] Phone: [phone] Relation: Spouse  Code Status:   Allergies: Review of patient's allergies indicates no known allergies.  Chief Complaint  Patient presents with  . New Admit To SNF    HPI: Patient is 82 y.o. male with history of advanced dementia, CVA, HTN, lived  with his spouse who can no longer take care of him at home. His last hospitalization was in 09/2014 for acute UTI with encephalopathy.He is admitted to SNF for residential care on 07/02/2015. At baseline he does not speak and requires help with his ADL's. While at SNF pt will be followed for HTN, tx with hydralazine and benicar, seizures tx with keppra and s/p CVA tx with plavix.  Past Medical History  Diagnosis Date  . CEREBROVASCULAR ACCIDENT, HX OF 02/09/2007  . DEMENTIA 05/12/2007  . HYPERGLYCEMIA 09/21/2009  . HYPERTENSION 12/29/2006  . OBSTRUCTIVE SLEEP APNEA 12/29/2006  . PROSTATE CANCER, HX OF 12/29/2006  . Hemorrhoids   . Lactose intolerance   . Risk for falls   . Prostatitis   . Aortic root dilatation ([place])     Noted on 09/2010 echo, with mild-mod AI  . Seizures [facility])     Past Surgical History  Procedure Laterality Date  . Cholecystectomy    . Prostate surgery      cryotherapy  . Ganglion cyst excision        Medication List       This list is accurate as of: 07/05/15  5:59 PM.  Always use your most recent med list.               BENICAR 20 MG tablet  Generic drug:  olmesartan  take 1 tablet by mouth once daily     clopidogrel 75 MG tablet  Commonly known as:  PLAVIX  take 1 tablet by mouth once daily     darifenacin 7.5 MG 24 hr tablet  Commonly  known as:  ENABLEX  Take 1 tablet (7.5 mg total) by mouth daily.     felodipine 5 MG 24 hr tablet  Commonly known as:  PLENDIL  take 1 tablet by mouth once daily     hydrALAZINE 50 MG tablet  Commonly known as:  APRESOLINE  take 1 tablet by mouth three times a day     levETIRAcetam 250 MG tablet  Commonly known as:  KEPPRA  Take 1 tablet (250 mg total) by mouth 2 (two) times daily.        No orders of the defined types were placed in this encounter.    Immunization History  Administered Date(s) Administered  . Influenza Split 01/22/2011, 01/15/2012  . Influenza Whole 04/04/2006, 05/12/2007, 01/04/2010  . Influenza,inj,Quad PF,36+ Mos 02/01/2013, 02/14/2014, 01/11/2015  . PPD Test 05/22/2015  . Pneumococcal Conjugate-13 01/11/2015  . Pneumococcal Polysaccharide-23 05/12/2007  . Td 09/23/1998    Social History  Substance Use Topics  . Smoking status: Never Smoker   . Smokeless tobacco: Never Used  . Alcohol Use: No    There were no vitals filed for this visit.  Physical Exam  GENERAL APPEARANCE: Alert, conversant. No acute distress.  HEENT: Unremarkable. RESPIRATORY: Breathing is even, unlabored. Lung sounds are  clear   CARDIOVASCULAR: Heart RRR no murmurs, rubs or gallops. No peripheral edema.  GASTROINTESTINAL: Abdomen is soft, non-tender, not distended w/ normal bowel sounds.  NEUROLOGIC: Cranial nerves 2-12 grossly intact. Moves all extremities  Patient Active Problem List   Diagnosis Date Noted  . Altered mental status 09/23/2014  . UTI (lower urinary tract infection) 09/23/2014  . Overactive bladder 06/29/2014  . Do not resuscitate 09/27/2013  . Seizures ([place]) 02/01/2013  . Aortic insufficiency 07/04/2011  . Chronic renal insufficiency, stage I 10/11/2010  . Hyperglycemia 09/21/2009  . Alzheimer's dementia 05/12/2007  . History of CVA (cerebrovascular accident) 02/09/2007  . Obstructive sleep apnea 12/29/2006  . Essential hypertension 12/29/2006  .  PROSTATE CANCER, HX OF 12/29/2006    CBC    Component Value Date/Time   WBC 7.2 09/24/2014 0414   RBC 4.47 09/24/2014 0414   HGB 12.6* 09/24/2014 0414   HCT 38.0* 09/24/2014 0414   PLT 203 09/24/2014 0414   MCV 85.0 09/24/2014 0414   LYMPHSABS 1.4 09/23/2014 1210   MONOABS 0.4 09/23/2014 1210   EOSABS 0.1 09/23/2014 1210   BASOSABS 0.0 09/23/2014 1210    CMP     Component Value Date/Time   NA 140 09/25/2014 0418   K 3.6 09/25/2014 0418   CL 106 09/25/2014 0418   CO2 27 09/25/2014 0418   GLUCOSE 91 09/25/2014 0418   BUN 11 09/25/2014 0418   CREATININE 0.98 09/25/2014 0418   CALCIUM 8.7* 09/25/2014 0418   PROT 6.7 09/24/2014 0414   ALBUMIN 3.2* 09/24/2014 0414   AST 16 09/24/2014 0414   ALT 11* 09/24/2014 0414   ALKPHOS 70 09/24/2014 0414   BILITOT 0.5 09/24/2014 0414   GFRNONAA >60 09/25/2014 0418   GFRAA >60 09/25/2014 0418    Assessment and Plan  Alzheimer's dementia Dementia advanced; dementia specific meds of no use; supportive care.  Seizures Plan continue keppra 250 mg BID which pt is controlled on.  Essential hypertension Today BP is not well controlled on plendil, hydralazine and benicar; will not change meds now, will monitor; do not think he would do well with no BP meds unless we stopped taking vital signs altogether  Obstructive sleep apnea Pt sis use CPAP prior but no longer; will monitor  Overactive bladder Ebablix I consider a comfort med and helps avoid UTIs  History of CVA (cerebrovascular accident) Pt is still on plavix;at some point will speak again with wife about stopping it. Pt has had no difficulty with it so equivical about stopping it.   Time spent > 45 min;> 50% of time with patient was spent reviewing records, labs, tests and studies, counseling and developing plan of care  Hennie Duos, MD

## 2015-07-05 NOTE — Assessment & Plan Note (Signed)
Dementia advanced; dementia specific meds of no use; supportive care.

## 2015-07-05 NOTE — Progress Notes (Signed)
MRN: [id number] Name: Mike Williams  Sex: male Age: 82 y.o. DOB: 11/17/1932  [place] #: Karren Burly Facility/Room:110 Level Of Care: SNF Provider: Inocencio Homes D Emergency Contacts: Extended Emergency Contact Information Primary Emergency Contact: Jamerson,[name] Address: [address]          [place], [place] [address] [place] Phone: [phone] Relation: Spouse  Code Status:   Allergies: Review of patient's allergies indicates no known allergies.  Chief Complaint  Patient presents with  . New Admit To SNF    HPI: Patient is 82 y.o. male with history of advanced dementia, CVA, HTN, lived  with his spouse who can no longer take care of him at home. His last hospitalization was in 09/2014 for acute UTI with encephalopathy.He is admitted to SNF for residential care on 07/02/2015. At baseline he does not speak and requires help with his ADL's. While at SNF pt will be followed for HTN, tx with hydralazine and benicar, seizures tx with keppra and s/p CVA tx with plavix.   Past Medical History  Diagnosis Date  . CEREBROVASCULAR ACCIDENT, HX OF 02/09/2007  . DEMENTIA 05/12/2007  . HYPERGLYCEMIA 09/21/2009  . HYPERTENSION 12/29/2006  . OBSTRUCTIVE SLEEP APNEA 12/29/2006  . PROSTATE CANCER, HX OF 12/29/2006  . Hemorrhoids   . Lactose intolerance   . Risk for falls   . Prostatitis   . Aortic root dilatation ([place])     Noted on 09/2010 echo, with mild-mod AI  . Seizures [facility])     Past Surgical History  Procedure Laterality Date  . Cholecystectomy    . Prostate surgery      cryotherapy  . Ganglion cyst excision        Medication List       This list is accurate as of: 07/05/15  6:02 PM.  Always use your most recent med list.               BENICAR 20 MG tablet  Generic drug:  olmesartan  take 1 tablet by mouth once daily     clopidogrel 75 MG tablet  Commonly known as:  PLAVIX  take 1 tablet by mouth once daily     darifenacin 7.5 MG 24 hr tablet   Commonly known as:  ENABLEX  Take 1 tablet (7.5 mg total) by mouth daily.     felodipine 5 MG 24 hr tablet  Commonly known as:  PLENDIL  take 1 tablet by mouth once daily     hydrALAZINE 50 MG tablet  Commonly known as:  APRESOLINE  take 1 tablet by mouth three times a day     levETIRAcetam 250 MG tablet  Commonly known as:  KEPPRA  Take 1 tablet (250 mg total) by mouth 2 (two) times daily.        No orders of the defined types were placed in this encounter.    Immunization History  Administered Date(s) Administered  . Influenza Split 01/22/2011, 01/15/2012  . Influenza Whole 04/04/2006, 05/12/2007, 01/04/2010  . Influenza,inj,Quad PF,36+ Mos 02/01/2013, 02/14/2014, 01/11/2015  . PPD Test 05/22/2015  . Pneumococcal Conjugate-13 01/11/2015  . Pneumococcal Polysaccharide-23 05/12/2007  . Td 09/23/1998    Social History  Substance Use Topics  . Smoking status: Never Smoker   . Smokeless tobacco: Never Used  . Alcohol Use: No    Family history is + HTN, HD   Review of Systems  DATA OBTAINED: from wife, nursing GENERAL:  no fevers, fatigue, appetite changes SKIN: No itching, rash or  wounds EYES: No eye pain, redness, discharge EARS: No earache, tinnitus, change in hearing NOSE: No congestion, drainage or bleeding  MOUTH/THROAT: No mouth or tooth pain, No sore throat RESPIRATORY: No cough, wheezing, SOB CARDIAC: No chest pain, palpitations, lower extremity edema  GI: No abdominal pain, No N/V/D or constipation, No heartburn or reflux  GU: No dysuria, frequency or urgency, or incontinence  MUSCULOSKELETAL: No unrelieved bone/joint pain NEUROLOGIC: No headache, dizziness or focal weakness PSYCHIATRIC: No c/o anxiety or sadness ; doesn't speak is not upset about moving to SNF  Filed Vitals:   07/05/15 1759  BP: 160/70  Pulse: 70  Temp: 98 F (36.7 C)  Resp: 18    SpO2 Readings from Last 1 Encounters:  09/25/14 99%        Physical Exam  GENERAL  APPEARANCE: Alert, nonconversant,  No acute distress.  SKIN: No diaphoresis rash HEAD: Normocephalic, atraumatic  EYES: Conjunctiva/lids clear. Pupils round, reactive. EOMs intact.  EARS: External exam WNL, canals clear. Hearing grossly normal.  NOSE: No deformity or discharge.  MOUTH/THROAT: Lips w/o lesions  RESPIRATORY: Breathing is even, unlabored. Lung sounds are clear   CARDIOVASCULAR: Heart RRR no murmurs, rubs or gallops. No peripheral edema.   GASTROINTESTINAL: Abdomen is soft, non-tender, not distended w/ normal bowel sounds. GENITOURINARY: Bladder non tender, not distended  MUSCULOSKELETAL: No abnormal joints or musculature NEUROLOGIC:  Cranial nerves 2-12 grossly intact. Moves all extremities  PSYCHIATRIC: flat affect, no behavioral issues  Patient Active Problem List   Diagnosis Date Noted  . Altered mental status 09/23/2014  . UTI (lower urinary tract infection) 09/23/2014  . Overactive bladder 06/29/2014  . Do not resuscitate 09/27/2013  . Seizures ([place]) 02/01/2013  . Aortic insufficiency 07/04/2011  . Chronic renal insufficiency, stage I 10/11/2010  . Hyperglycemia 09/21/2009  . Alzheimer's dementia 05/12/2007  . History of CVA (cerebrovascular accident) 02/09/2007  . Obstructive sleep apnea 12/29/2006  . Essential hypertension 12/29/2006  . PROSTATE CANCER, HX OF 12/29/2006    CBC    Component Value Date/Time   WBC 7.2 09/24/2014 0414   RBC 4.47 09/24/2014 0414   HGB 12.6* 09/24/2014 0414   HCT 38.0* 09/24/2014 0414   PLT 203 09/24/2014 0414   MCV 85.0 09/24/2014 0414   LYMPHSABS 1.4 09/23/2014 1210   MONOABS 0.4 09/23/2014 1210   EOSABS 0.1 09/23/2014 1210   BASOSABS 0.0 09/23/2014 1210    CMP     Component Value Date/Time   NA 140 09/25/2014 0418   K 3.6 09/25/2014 0418   CL 106 09/25/2014 0418   CO2 27 09/25/2014 0418   GLUCOSE 91 09/25/2014 0418   BUN 11 09/25/2014 0418   CREATININE 0.98 09/25/2014 0418   CALCIUM 8.7* 09/25/2014 0418    PROT 6.7 09/24/2014 0414   ALBUMIN 3.2* 09/24/2014 0414   AST 16 09/24/2014 0414   ALT 11* 09/24/2014 0414   ALKPHOS 70 09/24/2014 0414   BILITOT 0.5 09/24/2014 0414   GFRNONAA >60 09/25/2014 0418   GFRAA >60 09/25/2014 0418    Lab Results  Component Value Date   HGBA1C 5.8 02/01/2013     Ct Head Wo Contrast  09/23/2014  CLINICAL DATA:  Patient acting strange for 1 week. Patient with history of dementia and prior TIAs. Patient neurologically weak on left side. EXAM: CT HEAD WITHOUT CONTRAST TECHNIQUE: Contiguous axial images were obtained from the base of the skull through the vertex without intravenous contrast. COMPARISON:  Brain CT 02/03/2014 FINDINGS: Ventricles and sulci are prominent compatible  with atrophy. Periventricular and subcortical white matter hypodensity compatible with chronic small vessel ischemic changes. Bilateral basal ganglia lacunar infarcts. Basal ganglia calcifications. No evidence for acute cortically based infarct, intracranial hemorrhage, mass lesion or mass-effect. Orbits are unremarkable. Polypoid mucosal thickening frontal sinus. Mastoid air cells are unremarkable. Calvarium is intact. IMPRESSION: No acute intracranial process. Diffuse cerebral atrophy and chronic small vessel ischemic change. Electronically Signed   By: Lovey Newcomer [name]   On: 09/23/2014 12:58    Not all labs, radiology exams or other studies done during hospitalization come through on my EPIC note; however they are reviewed by me.    Assessment and Plan  Alzheimer's dementia Dementia advanced; dementia specific meds of no use; supportive care.  Seizures Plan continue keppra 250 mg BID which pt is controlled on.  Essential hypertension Today BP is not well controlled on plendil, hydralazine and benicar; will not change meds now, will monitor; do not think he would do well with no BP meds unless we stopped taking vital signs altogether  Obstructive sleep apnea Pt sis use CPAP prior but  no longer; will monitor  Overactive bladder Ebablix I consider a comfort med and helps avoid UTIs  History of CVA (cerebrovascular accident) Pt is still on plavix;at some point will speak again with wife about stopping it. Pt has had no difficulty with it so equivical about stopping it.   Time spent > 45 min;> 50% of time with patient was spent reviewing records, labs, tests and studies, counseling and developing plan of care  Hennie Duos, MD

## 2015-07-05 NOTE — Assessment & Plan Note (Signed)
Plan continue keppra 250 mg BID which pt is controlled on.

## 2015-07-05 NOTE — Assessment & Plan Note (Signed)
Today BP is not well controlled on plendil, hydralazine and benicar; will not change meds now, will monitor; do not think he would do well with no BP meds unless we stopped taking vital signs altogether

## 2015-07-05 NOTE — Assessment & Plan Note (Signed)
Pt sis use CPAP prior but no longer; will monitor

## 2015-07-05 NOTE — Telephone Encounter (Signed)
Called and spoke with Mike Williams and she wanted to clarify that patients meds were correct.

## 2015-07-05 NOTE — Assessment & Plan Note (Addendum)
Pt is still on plavix;at some point will speak again with wife about stopping it. Pt has had no difficulty with it so equivical about stopping it; will monitor

## 2015-07-06 DIAGNOSIS — R2689 Other abnormalities of gait and mobility: Secondary | ICD-10-CM | POA: Diagnosis not present

## 2015-07-06 DIAGNOSIS — G309 Alzheimer's disease, unspecified: Secondary | ICD-10-CM | POA: Diagnosis not present

## 2015-07-08 DIAGNOSIS — G309 Alzheimer's disease, unspecified: Secondary | ICD-10-CM | POA: Diagnosis not present

## 2015-07-08 DIAGNOSIS — R2689 Other abnormalities of gait and mobility: Secondary | ICD-10-CM | POA: Diagnosis not present

## 2015-07-10 DIAGNOSIS — R2689 Other abnormalities of gait and mobility: Secondary | ICD-10-CM | POA: Diagnosis not present

## 2015-07-10 DIAGNOSIS — G309 Alzheimer's disease, unspecified: Secondary | ICD-10-CM | POA: Diagnosis not present

## 2015-07-11 DIAGNOSIS — R2689 Other abnormalities of gait and mobility: Secondary | ICD-10-CM | POA: Diagnosis not present

## 2015-07-11 DIAGNOSIS — G309 Alzheimer's disease, unspecified: Secondary | ICD-10-CM | POA: Diagnosis not present

## 2015-07-13 DIAGNOSIS — R2689 Other abnormalities of gait and mobility: Secondary | ICD-10-CM | POA: Diagnosis not present

## 2015-07-13 DIAGNOSIS — G309 Alzheimer's disease, unspecified: Secondary | ICD-10-CM | POA: Diagnosis not present

## 2015-07-14 DIAGNOSIS — R2689 Other abnormalities of gait and mobility: Secondary | ICD-10-CM | POA: Diagnosis not present

## 2015-07-14 DIAGNOSIS — G309 Alzheimer's disease, unspecified: Secondary | ICD-10-CM | POA: Diagnosis not present

## 2015-07-16 DIAGNOSIS — R2689 Other abnormalities of gait and mobility: Secondary | ICD-10-CM | POA: Diagnosis not present

## 2015-07-16 DIAGNOSIS — G309 Alzheimer's disease, unspecified: Secondary | ICD-10-CM | POA: Diagnosis not present

## 2015-07-17 DIAGNOSIS — G309 Alzheimer's disease, unspecified: Secondary | ICD-10-CM | POA: Diagnosis not present

## 2015-07-17 DIAGNOSIS — R2689 Other abnormalities of gait and mobility: Secondary | ICD-10-CM | POA: Diagnosis not present

## 2015-07-18 DIAGNOSIS — G309 Alzheimer's disease, unspecified: Secondary | ICD-10-CM | POA: Diagnosis not present

## 2015-07-18 DIAGNOSIS — R2689 Other abnormalities of gait and mobility: Secondary | ICD-10-CM | POA: Diagnosis not present

## 2015-07-19 DIAGNOSIS — G309 Alzheimer's disease, unspecified: Secondary | ICD-10-CM | POA: Diagnosis not present

## 2015-07-19 DIAGNOSIS — R2689 Other abnormalities of gait and mobility: Secondary | ICD-10-CM | POA: Diagnosis not present

## 2015-07-20 DIAGNOSIS — R2689 Other abnormalities of gait and mobility: Secondary | ICD-10-CM | POA: Diagnosis not present

## 2015-07-20 DIAGNOSIS — G309 Alzheimer's disease, unspecified: Secondary | ICD-10-CM | POA: Diagnosis not present

## 2015-07-20 IMAGING — NM NM BONE WHOLE BODY
2 series · 2 of 2 positions shown · non-contrast
Comparison: Abdomen pelvis CT dated 01/11/2012, Head CT dated
02/03/2014 and chest radiographs dated 01/12/2012.

CLINICAL DATA: Prostate cancer.  PSA 0.27.

EXAM:
NUCLEAR MEDICINE WHOLE BODY BONE SCAN
TECHNIQUE: Whole body anterior and posterior images were obtained approximately
3 hours after intravenous injection of radiopharmaceutical.
RADIOPHARMACEUTICALS:  26.3 mCi Sechnetium-QQ MDP

[Series 1: wbr_bone_60 whole body · 2.66mm/px · 1 of 1 slices shown (1 of 2)]
[im 1/1]
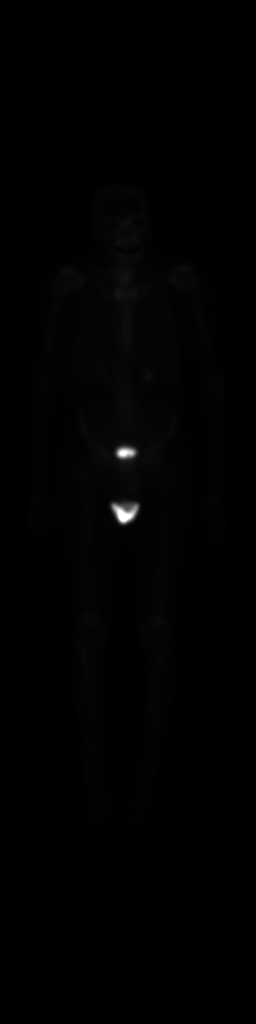

[Series 1: wbr_bone_60 whole body · 2.66mm/px · 1 of 1 slices shown (2 of 2)]
[im 1/1]
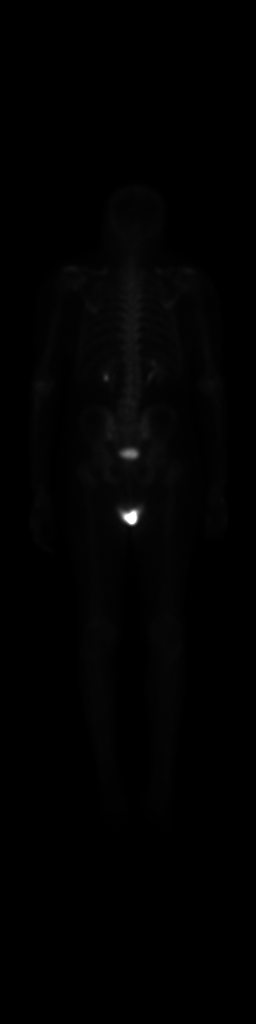

[2 of 2 positions shown; findings below may reference images not displayed]

FINDINGS: Normal tracer distribution throughout the bony skeleton. Normal
renal and bladder activity.
IMPRESSION: No evidence of bony metastatic disease.

## 2015-07-24 DIAGNOSIS — R2689 Other abnormalities of gait and mobility: Secondary | ICD-10-CM | POA: Diagnosis not present

## 2015-07-24 DIAGNOSIS — G309 Alzheimer's disease, unspecified: Secondary | ICD-10-CM | POA: Diagnosis not present

## 2015-07-25 DIAGNOSIS — R2689 Other abnormalities of gait and mobility: Secondary | ICD-10-CM | POA: Diagnosis not present

## 2015-07-25 DIAGNOSIS — G309 Alzheimer's disease, unspecified: Secondary | ICD-10-CM | POA: Diagnosis not present

## 2015-07-26 DIAGNOSIS — R2689 Other abnormalities of gait and mobility: Secondary | ICD-10-CM | POA: Diagnosis not present

## 2015-07-26 DIAGNOSIS — G309 Alzheimer's disease, unspecified: Secondary | ICD-10-CM | POA: Diagnosis not present

## 2015-07-26 DIAGNOSIS — F482 Pseudobulbar affect: Secondary | ICD-10-CM | POA: Diagnosis not present

## 2015-07-27 DIAGNOSIS — G309 Alzheimer's disease, unspecified: Secondary | ICD-10-CM | POA: Diagnosis not present

## 2015-07-27 DIAGNOSIS — R2689 Other abnormalities of gait and mobility: Secondary | ICD-10-CM | POA: Diagnosis not present

## 2015-07-30 DIAGNOSIS — G309 Alzheimer's disease, unspecified: Secondary | ICD-10-CM | POA: Diagnosis not present

## 2015-07-30 DIAGNOSIS — R2689 Other abnormalities of gait and mobility: Secondary | ICD-10-CM | POA: Diagnosis not present

## 2015-07-31 DIAGNOSIS — G309 Alzheimer's disease, unspecified: Secondary | ICD-10-CM | POA: Diagnosis not present

## 2015-07-31 DIAGNOSIS — R2689 Other abnormalities of gait and mobility: Secondary | ICD-10-CM | POA: Diagnosis not present

## 2015-08-01 DIAGNOSIS — G309 Alzheimer's disease, unspecified: Secondary | ICD-10-CM | POA: Diagnosis not present

## 2015-08-01 DIAGNOSIS — R2689 Other abnormalities of gait and mobility: Secondary | ICD-10-CM | POA: Diagnosis not present

## 2015-08-02 DIAGNOSIS — R2689 Other abnormalities of gait and mobility: Secondary | ICD-10-CM | POA: Diagnosis not present

## 2015-08-02 DIAGNOSIS — G309 Alzheimer's disease, unspecified: Secondary | ICD-10-CM | POA: Diagnosis not present

## 2015-08-03 DIAGNOSIS — R2689 Other abnormalities of gait and mobility: Secondary | ICD-10-CM | POA: Diagnosis not present

## 2015-08-03 DIAGNOSIS — G309 Alzheimer's disease, unspecified: Secondary | ICD-10-CM | POA: Diagnosis not present

## 2015-08-05 DIAGNOSIS — G309 Alzheimer's disease, unspecified: Secondary | ICD-10-CM | POA: Diagnosis not present

## 2015-08-05 DIAGNOSIS — R2689 Other abnormalities of gait and mobility: Secondary | ICD-10-CM | POA: Diagnosis not present

## 2015-08-06 DIAGNOSIS — G309 Alzheimer's disease, unspecified: Secondary | ICD-10-CM | POA: Diagnosis not present

## 2015-08-06 DIAGNOSIS — R2689 Other abnormalities of gait and mobility: Secondary | ICD-10-CM | POA: Diagnosis not present

## 2015-08-07 DIAGNOSIS — G309 Alzheimer's disease, unspecified: Secondary | ICD-10-CM | POA: Diagnosis not present

## 2015-08-07 DIAGNOSIS — R2689 Other abnormalities of gait and mobility: Secondary | ICD-10-CM | POA: Diagnosis not present

## 2015-08-08 ENCOUNTER — Encounter: Payer: Self-pay | Admitting: Adult Health

## 2015-08-08 ENCOUNTER — Non-Acute Institutional Stay (SKILLED_NURSING_FACILITY): Payer: Medicare Other | Admitting: Adult Health

## 2015-08-08 DIAGNOSIS — F0280 Dementia in other diseases classified elsewhere without behavioral disturbance: Secondary | ICD-10-CM

## 2015-08-08 DIAGNOSIS — N3281 Overactive bladder: Secondary | ICD-10-CM

## 2015-08-08 DIAGNOSIS — G309 Alzheimer's disease, unspecified: Secondary | ICD-10-CM | POA: Diagnosis not present

## 2015-08-08 DIAGNOSIS — F482 Pseudobulbar affect: Secondary | ICD-10-CM | POA: Diagnosis not present

## 2015-08-08 DIAGNOSIS — R569 Unspecified convulsions: Secondary | ICD-10-CM

## 2015-08-08 DIAGNOSIS — Z8673 Personal history of transient ischemic attack (TIA), and cerebral infarction without residual deficits: Secondary | ICD-10-CM

## 2015-08-08 DIAGNOSIS — R2689 Other abnormalities of gait and mobility: Secondary | ICD-10-CM | POA: Diagnosis not present

## 2015-08-08 DIAGNOSIS — I10 Essential (primary) hypertension: Secondary | ICD-10-CM

## 2015-08-08 NOTE — Progress Notes (Signed)
Patient ID: Mike Williams, male   DOB: 09-13-1932, 82 y.o.   MRN: [id number]   Facility:  [facility]       No Known Allergies  Chief Complaint  Patient presents with  . Medical Management of Chronic Issues    HPI:  He is a long term resident of this facility being seen for the management of his chronic illnesses. Overall his status remains stable. His blood readings are elevated and will need his medications adjusted. He is unable to participate in the hpi or ros.    Past Medical History  Diagnosis Date  . CEREBROVASCULAR ACCIDENT, HX OF 02/09/2007  . DEMENTIA 05/12/2007  . HYPERGLYCEMIA 09/21/2009  . HYPERTENSION 12/29/2006  . OBSTRUCTIVE SLEEP APNEA 12/29/2006  . PROSTATE CANCER, HX OF 12/29/2006  . Hemorrhoids   . Lactose intolerance   . Risk for falls   . Prostatitis   . Aortic root dilatation ([place])     Noted on 09/2010 echo, with mild-mod AI  . Seizures [facility]     Past Surgical History  Procedure Laterality Date  . Cholecystectomy    . Prostate surgery      cryotherapy  . Ganglion cyst excision      VITAL SIGNS BP 178/93 mmHg  Pulse 65  Temp(Src) 98.4 F (36.9 C)  Wt 145 lb (65.772 kg)  SpO2 97%  Patient's Medications  New Prescriptions   No medications on file  Previous Medications   BENICAR 20 MG TABLET    take 1 tablet by mouth once daily   CLOPIDOGREL (PLAVIX) 75 MG TABLET    take 1 tablet by mouth once daily   DARIFENACIN (ENABLEX) 7.5 MG 24 HR TABLET    Take 1 tablet (7.5 mg total) by mouth daily.   DEXTROMETHORPHAN-QUINIDINE (NUEDEXTA) 20-10 MG CAPS    Take 1 capsule by mouth 2 (two) times daily.   FELODIPINE (PLENDIL) 5 MG 24 HR TABLET    take 1 tablet by mouth once daily   HYDRALAZINE (APRESOLINE) 50 MG TABLET    take 1 tablet by mouth three times a day   LEVETIRACETAM (KEPPRA) 250 MG TABLET    Take 1 tablet (250 mg total) by mouth 2 (two) times daily.  Modified Medications   No medications on file  Discontinued Medications   No medications on file       SIGNIFICANT DIAGNOSTIC EXAMS   LABS REVIEWED:   07-05-15: wbc 6.6; hgb 13.8; hct 41.3; mcv 85.2; plt 236; glucose 86; bun 13.4; creat 0.92; k+ 4.1; na++144; liver normal albumin 3.5     Review of Systems  Unable to perform ROS: dementia    Physical Exam  Constitutional: No distress.  Eyes: Conjunctivae are normal.  Neck: Neck supple. No JVD present. No thyromegaly present.  Cardiovascular: Normal rate, regular rhythm and intact distal pulses.   Respiratory: Effort normal and breath sounds normal. No respiratory distress. He has no wheezes.  GI: Soft. Bowel sounds are normal. He exhibits no distension. There is no tenderness.  Musculoskeletal: He exhibits no edema.  Able to move all extremities   Lymphadenopathy:    He has no cervical adenopathy.  Neurological: He is alert.  Skin: Skin is warm and dry. He is not diaphoretic.  Psychiatric: He has a normal mood and affect.        ASSESSMENT/ PLAN:  1. Hypertension: will continue benicar 20 mg daily; apresoline  50 mg three times daily and will increase his plendil to 10 mg daily will check blood  pressure twice daily   2. Seizure: no reports of seizure present will continue keppra 250 mg twice daily   3. UI: will continue enablex 7.5 mg daily  4. CVA: is neurologically stable will continue plavix 75 m daily   5. Alzheimer's disease: is without significant change in status; is presently not on medications; will not make changes at this time; will monitor   6. PBA: will continue nuedexta 20-10 mg twice daily and will monitor        Ok Edwards NP [facility]  Contact [phone] Monday through Friday 8am- 5pm  After hours call [phone]

## 2015-08-10 ENCOUNTER — Other Ambulatory Visit: Payer: Self-pay

## 2015-08-10 DIAGNOSIS — G309 Alzheimer's disease, unspecified: Secondary | ICD-10-CM | POA: Diagnosis not present

## 2015-08-10 DIAGNOSIS — R2689 Other abnormalities of gait and mobility: Secondary | ICD-10-CM | POA: Diagnosis not present

## 2015-08-13 DIAGNOSIS — R2689 Other abnormalities of gait and mobility: Secondary | ICD-10-CM | POA: Diagnosis not present

## 2015-08-13 DIAGNOSIS — G309 Alzheimer's disease, unspecified: Secondary | ICD-10-CM | POA: Diagnosis not present

## 2015-08-13 DIAGNOSIS — F482 Pseudobulbar affect: Secondary | ICD-10-CM | POA: Diagnosis not present

## 2015-08-13 DIAGNOSIS — F329 Major depressive disorder, single episode, unspecified: Secondary | ICD-10-CM | POA: Diagnosis not present

## 2015-08-14 DIAGNOSIS — R2689 Other abnormalities of gait and mobility: Secondary | ICD-10-CM | POA: Diagnosis not present

## 2015-08-14 DIAGNOSIS — G309 Alzheimer's disease, unspecified: Secondary | ICD-10-CM | POA: Diagnosis not present

## 2015-08-15 DIAGNOSIS — R2689 Other abnormalities of gait and mobility: Secondary | ICD-10-CM | POA: Diagnosis not present

## 2015-08-15 DIAGNOSIS — G309 Alzheimer's disease, unspecified: Secondary | ICD-10-CM | POA: Diagnosis not present

## 2015-08-16 DIAGNOSIS — G309 Alzheimer's disease, unspecified: Secondary | ICD-10-CM | POA: Diagnosis not present

## 2015-08-16 DIAGNOSIS — R2689 Other abnormalities of gait and mobility: Secondary | ICD-10-CM | POA: Diagnosis not present

## 2015-08-17 DIAGNOSIS — G309 Alzheimer's disease, unspecified: Secondary | ICD-10-CM | POA: Diagnosis not present

## 2015-08-17 DIAGNOSIS — R2689 Other abnormalities of gait and mobility: Secondary | ICD-10-CM | POA: Diagnosis not present

## 2015-08-20 DIAGNOSIS — R2689 Other abnormalities of gait and mobility: Secondary | ICD-10-CM | POA: Diagnosis not present

## 2015-08-20 DIAGNOSIS — G309 Alzheimer's disease, unspecified: Secondary | ICD-10-CM | POA: Diagnosis not present

## 2015-08-21 DIAGNOSIS — R2689 Other abnormalities of gait and mobility: Secondary | ICD-10-CM | POA: Diagnosis not present

## 2015-08-21 DIAGNOSIS — G309 Alzheimer's disease, unspecified: Secondary | ICD-10-CM | POA: Diagnosis not present

## 2015-08-22 DIAGNOSIS — G309 Alzheimer's disease, unspecified: Secondary | ICD-10-CM | POA: Diagnosis not present

## 2015-08-22 DIAGNOSIS — R2689 Other abnormalities of gait and mobility: Secondary | ICD-10-CM | POA: Diagnosis not present

## 2015-08-24 DIAGNOSIS — G309 Alzheimer's disease, unspecified: Secondary | ICD-10-CM | POA: Diagnosis not present

## 2015-08-24 DIAGNOSIS — R2689 Other abnormalities of gait and mobility: Secondary | ICD-10-CM | POA: Diagnosis not present

## 2015-08-25 DIAGNOSIS — R2689 Other abnormalities of gait and mobility: Secondary | ICD-10-CM | POA: Diagnosis not present

## 2015-08-25 DIAGNOSIS — G309 Alzheimer's disease, unspecified: Secondary | ICD-10-CM | POA: Diagnosis not present

## 2015-08-27 DIAGNOSIS — G309 Alzheimer's disease, unspecified: Secondary | ICD-10-CM | POA: Diagnosis not present

## 2015-08-27 DIAGNOSIS — R2689 Other abnormalities of gait and mobility: Secondary | ICD-10-CM | POA: Diagnosis not present

## 2015-08-28 DIAGNOSIS — R2689 Other abnormalities of gait and mobility: Secondary | ICD-10-CM | POA: Diagnosis not present

## 2015-08-28 DIAGNOSIS — G309 Alzheimer's disease, unspecified: Secondary | ICD-10-CM | POA: Diagnosis not present

## 2015-08-30 ENCOUNTER — Encounter: Payer: Self-pay | Admitting: Internal Medicine

## 2015-08-30 DIAGNOSIS — Z79810 Long term (current) use of selective estrogen receptor modulators (SERMs): Secondary | ICD-10-CM | POA: Diagnosis not present

## 2015-08-30 DIAGNOSIS — R319 Hematuria, unspecified: Secondary | ICD-10-CM | POA: Diagnosis not present

## 2015-08-30 DIAGNOSIS — Z79899 Other long term (current) drug therapy: Secondary | ICD-10-CM | POA: Diagnosis not present

## 2015-08-30 DIAGNOSIS — N390 Urinary tract infection, site not specified: Secondary | ICD-10-CM | POA: Diagnosis not present

## 2015-08-30 NOTE — Progress Notes (Signed)
MRN: [id number] Name: Mike Williams  Sex: male Age: 82 y.o. DOB: 1932-07-10  [place] #:  Facility/Room: Level Of Care: SNF Provider: Inocencio Homes D Emergency Contacts: Extended Emergency Contact Information Primary Emergency Contact: Hovater,[name] Address: [address]          [place], [place] [address] [place] Phone: [phone] Relation: Spouse  Code Status:   Allergies: Review of patient's allergies indicates no known allergies.  Chief Complaint  Patient presents with  . Acute Visit    HPI: Patient is 82 y.o. male who   Past Medical History  Diagnosis Date  . CEREBROVASCULAR ACCIDENT, HX OF 02/09/2007  . DEMENTIA 05/12/2007  . HYPERGLYCEMIA 09/21/2009  . HYPERTENSION 12/29/2006  . OBSTRUCTIVE SLEEP APNEA 12/29/2006  . PROSTATE CANCER, HX OF 12/29/2006  . Hemorrhoids   . Lactose intolerance   . Risk for falls   . Prostatitis   . Aortic root dilatation ([place])     Noted on 09/2010 echo, with mild-mod AI  . Seizures ([place])   . Aortic insufficiency 07/04/2011    2/13 Study Conclusions  - Left ventricle: Systolic function was normal. The estimated ejection fraction was in the range of 55% to 60%. - Aortic valve: Moderate regurgitation. - Aorta: The aorta was moderately dilated. Transthoracic echocardiography. M-mode, complete 2D, spectral Doppler, and color Doppler. Height: Height: 170.2cm. Height: 67in. Weight: Weight: 84.1kg. Weight: 185lb. Body mass index: BMI: 29kg/m^2. Body surface area: BSA: 2.73m^2. Blood pressure: 175/86. Patient status: Inpatient. Location: Bedside.        Past Surgical History  Procedure Laterality Date  . Cholecystectomy    . Prostate surgery      cryotherapy  . Ganglion cyst excision        Medication List       This list is accurate as of: 08/30/15  1:24 PM.  Always use your most recent med list.               BENICAR 20 MG tablet  Generic drug:  olmesartan  take 1 tablet by mouth once daily     clopidogrel 75 MG  tablet  Commonly known as:  PLAVIX  take 1 tablet by mouth once daily     darifenacin 7.5 MG 24 hr tablet  Commonly known as:  ENABLEX  Take 1 tablet (7.5 mg total) by mouth daily.     felodipine 5 MG 24 hr tablet  Commonly known as:  PLENDIL  take 1 tablet by mouth once daily     hydrALAZINE 50 MG tablet  Commonly known as:  APRESOLINE  take 1 tablet by mouth three times a day     levETIRAcetam 250 MG tablet  Commonly known as:  KEPPRA  Take 1 tablet (250 mg total) by mouth 2 (two) times daily.     NUEDEXTA 20-10 MG Caps  Generic drug:  Dextromethorphan-Quinidine  Take 1 capsule by mouth 2 (two) times daily.        No orders of the defined types were placed in this encounter.    Immunization History  Administered Date(s) Administered  . Influenza Split 01/22/2011, 01/15/2012  . Influenza Whole 04/04/2006, 05/12/2007, 01/04/2010  . Influenza,inj,Quad PF,36+ Mos 02/01/2013, 02/14/2014, 01/11/2015  . PPD Test 05/22/2015  . Pneumococcal Conjugate-13 01/11/2015  . Pneumococcal Polysaccharide-23 05/12/2007  . Td 09/23/1998    Social History  Substance Use Topics  . Smoking status: Never Smoker   . Smokeless tobacco: Never Used  . Alcohol Use: No  Review of Systems  DATA OBTAINED: from patient, nurse, medical record, family member GENERAL:  no fevers, fatigue, appetite changes SKIN: No itching, rash HEENT: No complaint RESPIRATORY: No cough, wheezing, SOB CARDIAC: No chest pain, palpitations, lower extremity edema  GI: No abdominal pain, No N/V/D or constipation, No heartburn or reflux  GU: No dysuria, frequency or urgency, or incontinence  MUSCULOSKELETAL: No unrelieved bone/joint pain NEUROLOGIC: No headache, dizziness  PSYCHIATRIC: No overt anxiety or sadness  There were no vitals filed for this visit.  Physical Exam  GENERAL APPEARANCE: Alert, conversant, No acute distress  SKIN: No diaphoresis rash, or wounds HEENT: Unremarkable RESPIRATORY:  Breathing is even, unlabored. Lung sounds are clear   CARDIOVASCULAR: Heart RRR no murmurs, rubs or gallops. No peripheral edema  GASTROINTESTINAL: Abdomen is soft, non-tender, not distended w/ normal bowel sounds.  GENITOURINARY: Bladder non tender, not distended  MUSCULOSKELETAL: No abnormal joints or musculature NEUROLOGIC: Cranial nerves 2-12 grossly intact. Moves all extremities PSYCHIATRIC: Mood and affect appropriate to situation, no behavioral issues  Patient Active Problem List   Diagnosis Date Noted  . PBA (pseudobulbar affect) 08/08/2015  . Overactive bladder 06/29/2014  . Seizures ([place]) 02/01/2013  . Aortic insufficiency 07/04/2011  . Alzheimer's dementia 05/12/2007  . History of CVA (cerebrovascular accident) 02/09/2007  . Obstructive sleep apnea 12/29/2006  . Essential hypertension 12/29/2006  . PROSTATE CANCER, HX OF 12/29/2006    CBC    Component Value Date/Time   WBC 7.2 09/24/2014 0414   RBC 4.47 09/24/2014 0414   HGB 12.6* 09/24/2014 0414   HCT 38.0* 09/24/2014 0414   PLT 203 09/24/2014 0414   MCV 85.0 09/24/2014 0414   LYMPHSABS 1.4 09/23/2014 1210   MONOABS 0.4 09/23/2014 1210   EOSABS 0.1 09/23/2014 1210   BASOSABS 0.0 09/23/2014 1210    CMP     Component Value Date/Time   NA 140 09/25/2014 0418   K 3.6 09/25/2014 0418   CL 106 09/25/2014 0418   CO2 27 09/25/2014 0418   GLUCOSE 91 09/25/2014 0418   BUN 11 09/25/2014 0418   CREATININE 0.98 09/25/2014 0418   CALCIUM 8.7* 09/25/2014 0418   PROT 6.7 09/24/2014 0414   ALBUMIN 3.2* 09/24/2014 0414   AST 16 09/24/2014 0414   ALT 11* 09/24/2014 0414   ALKPHOS 70 09/24/2014 0414   BILITOT 0.5 09/24/2014 0414   GFRNONAA >60 09/25/2014 0418   GFRAA >60 09/25/2014 0418    Assessment and Plan UTI No problem-specific assessment & plan notes found for this encounter.   Hennie Duos, MD     This encounter was created in error - please disregard.

## 2015-09-02 ENCOUNTER — Encounter: Payer: Self-pay | Admitting: Internal Medicine

## 2015-09-03 ENCOUNTER — Encounter: Payer: Self-pay | Admitting: Internal Medicine

## 2015-09-03 ENCOUNTER — Non-Acute Institutional Stay (SKILLED_NURSING_FACILITY): Payer: Medicare Other | Admitting: Internal Medicine

## 2015-09-03 DIAGNOSIS — N3281 Overactive bladder: Secondary | ICD-10-CM | POA: Diagnosis not present

## 2015-09-03 DIAGNOSIS — Z8673 Personal history of transient ischemic attack (TIA), and cerebral infarction without residual deficits: Secondary | ICD-10-CM

## 2015-09-03 DIAGNOSIS — G309 Alzheimer's disease, unspecified: Secondary | ICD-10-CM | POA: Diagnosis not present

## 2015-09-03 DIAGNOSIS — R569 Unspecified convulsions: Secondary | ICD-10-CM | POA: Diagnosis not present

## 2015-09-03 DIAGNOSIS — F482 Pseudobulbar affect: Secondary | ICD-10-CM

## 2015-09-03 DIAGNOSIS — R2689 Other abnormalities of gait and mobility: Secondary | ICD-10-CM | POA: Diagnosis not present

## 2015-09-03 DIAGNOSIS — F329 Major depressive disorder, single episode, unspecified: Secondary | ICD-10-CM | POA: Diagnosis not present

## 2015-09-03 NOTE — Progress Notes (Signed)
MRN: [id number] Name: Mike Williams  Sex: male Age: 82 y.o. DOB: 1932-10-11  [place] #: Karren Burly Facility/Room:107 Level Of Care: SNF Provider: Inocencio Homes MDEmergency Contacts: Extended Emergency Contact Information Primary Emergency Contact: Hendricksen,[name] Address: [address]          [place], [place] [address] [place] Phone: [phone] Relation: Spouse  Code Status:   Allergies: Review of patient's allergies indicates no known allergies.  Chief Complaint  Patient presents with  . Medical Management of Chronic Issues    HPI: Patient is 82 y.o. male who is being seen for routine issues of PBA, h/o CVA and overactive bladder.   Past Medical History  Diagnosis Date  . CEREBROVASCULAR ACCIDENT, HX OF 02/09/2007  . DEMENTIA 05/12/2007  . HYPERGLYCEMIA 09/21/2009  . HYPERTENSION 12/29/2006  . OBSTRUCTIVE SLEEP APNEA 12/29/2006  . PROSTATE CANCER, HX OF 12/29/2006  . Hemorrhoids   . Lactose intolerance   . Risk for falls   . Prostatitis   . Aortic root dilatation ([place])     Noted on 09/2010 echo, with mild-mod AI  . Seizures ([place])   . Aortic insufficiency 07/04/2011    2/13 Study Conclusions  - Left ventricle: Systolic function was normal. The estimated ejection fraction was in the range of 55% to 60%. - Aortic valve: Moderate regurgitation. - Aorta: The aorta was moderately dilated. Transthoracic echocardiography. M-mode, complete 2D, spectral Doppler, and color Doppler. Height: Height: 170.2cm. Height: 67in. Weight: Weight: 84.1kg. Weight: 185lb. Body mass index: BMI: 29kg/m^2. Body surface area: BSA: 2.14m^2. Blood pressure: 175/86. Patient status: Inpatient. Location: Bedside.        Past Surgical History  Procedure Laterality Date  . Cholecystectomy    . Prostate surgery      cryotherapy  . Ganglion cyst excision        Medication List       This list is accurate as of: 09/03/15 11:59 PM.  Always use your most recent med list.                BENICAR 20 MG tablet  Generic drug:  olmesartan  take 1 tablet by mouth once daily     clopidogrel 75 MG tablet  Commonly known as:  PLAVIX  take 1 tablet by mouth once daily     darifenacin 7.5 MG 24 hr tablet  Commonly known as:  ENABLEX  Take 1 tablet (7.5 mg total) by mouth daily.     escitalopram 5 MG tablet  Commonly known as:  LEXAPRO  Take 5 mg by mouth daily. For depression     felodipine 5 MG 24 hr tablet  Commonly known as:  PLENDIL  take 1 tablet by mouth once daily     hydrALAZINE 50 MG tablet  Commonly known as:  APRESOLINE  take 1 tablet by mouth three times a day     levETIRAcetam 250 MG tablet  Commonly known as:  KEPPRA  Take 1 tablet (250 mg total) by mouth 2 (two) times daily.     NUEDEXTA 20-10 MG Caps  Generic drug:  Dextromethorphan-Quinidine  Take 1 capsule by mouth 2 (two) times daily.        Meds ordered this encounter  Medications  . escitalopram (LEXAPRO) 5 MG tablet    Sig: Take 5 mg by mouth daily. For depression    Immunization History  Administered Date(s) Administered  . Influenza Split 01/22/2011, 01/15/2012  . Influenza Whole 04/04/2006, 05/12/2007, 01/04/2010  . Influenza,inj,Quad PF,36+ Mos 02/01/2013,  02/14/2014, 01/11/2015  . PPD Test 05/22/2015  . Pneumococcal Conjugate-13 01/11/2015  . Pneumococcal Polysaccharide-23 05/12/2007  . Td 09/23/1998    Social History  Substance Use Topics  . Smoking status: Never Smoker   . Smokeless tobacco: Never Used  . Alcohol Use: No    Review of Systems  UTO 2/2 dementia    Filed Vitals:   09/03/15 1500  BP: 123/76  Pulse: 89  Temp: 98.2 F (36.8 C)  Resp: 18    Physical Exam  GENERAL APPEARANCE: Alert,  No acute distress BM sitting in WC  SKIN: No diaphoresis rash HEENT: Unremarkable RESPIRATORY: Breathing is even, unlabored. Lung sounds are clear   CARDIOVASCULAR: Heart RRR no murmurs, rubs or gallops. No peripheral edema  GASTROINTESTINAL: Abdomen is soft,  non-tender, not distended w/ normal bowel sounds.  GENITOURINARY: Bladder non tender, not distended  MUSCULOSKELETAL: No abnormal joints or musculature NEUROLOGIC: Cranial nerves 2-12 grossly intact. Moves all extremities PSYCHIATRIC: flat affect, no behavioral issues  Patient Active Problem List   Diagnosis Date Noted  . PBA (pseudobulbar affect) 08/08/2015  . Overactive bladder 06/29/2014  . Seizures ([place]) 02/01/2013  . Aortic insufficiency 07/04/2011  . Alzheimer's dementia 05/12/2007  . History of CVA (cerebrovascular accident) 02/09/2007  . Obstructive sleep apnea 12/29/2006  . Essential hypertension 12/29/2006  . PROSTATE CANCER, HX OF 12/29/2006    CBC    Component Value Date/Time   WBC 7.2 09/24/2014 0414   RBC 4.47 09/24/2014 0414   HGB 12.6* 09/24/2014 0414   HCT 38.0* 09/24/2014 0414   PLT 203 09/24/2014 0414   MCV 85.0 09/24/2014 0414   LYMPHSABS 1.4 09/23/2014 1210   MONOABS 0.4 09/23/2014 1210   EOSABS 0.1 09/23/2014 1210   BASOSABS 0.0 09/23/2014 1210    CMP     Component Value Date/Time   NA 140 09/25/2014 0418   K 3.6 09/25/2014 0418   CL 106 09/25/2014 0418   CO2 27 09/25/2014 0418   GLUCOSE 91 09/25/2014 0418   BUN 11 09/25/2014 0418   CREATININE 0.98 09/25/2014 0418   CALCIUM 8.7* 09/25/2014 0418   PROT 6.7 09/24/2014 0414   ALBUMIN 3.2* 09/24/2014 0414   AST 16 09/24/2014 0414   ALT 11* 09/24/2014 0414   ALKPHOS 70 09/24/2014 0414   BILITOT 0.5 09/24/2014 0414   GFRNONAA >60 09/25/2014 0418   GFRAA >60 09/25/2014 0418    Assessment and Plan  PBA (pseudobulbar affect) continue nuedexta 20-10 mg twice daily and will monitor   Overactive bladder Controlled ; continue enablex 7.5 mg daily  History of CVA (cerebrovascular accident) neurologically stable; continue plavix 75 m daily     Inocencio Homes  MD

## 2015-09-04 DIAGNOSIS — R2689 Other abnormalities of gait and mobility: Secondary | ICD-10-CM | POA: Diagnosis not present

## 2015-09-04 DIAGNOSIS — G309 Alzheimer's disease, unspecified: Secondary | ICD-10-CM | POA: Diagnosis not present

## 2015-09-05 DIAGNOSIS — R2689 Other abnormalities of gait and mobility: Secondary | ICD-10-CM | POA: Diagnosis not present

## 2015-09-05 DIAGNOSIS — G309 Alzheimer's disease, unspecified: Secondary | ICD-10-CM | POA: Diagnosis not present

## 2015-09-06 DIAGNOSIS — R2689 Other abnormalities of gait and mobility: Secondary | ICD-10-CM | POA: Diagnosis not present

## 2015-09-06 DIAGNOSIS — G309 Alzheimer's disease, unspecified: Secondary | ICD-10-CM | POA: Diagnosis not present

## 2015-09-07 DIAGNOSIS — G309 Alzheimer's disease, unspecified: Secondary | ICD-10-CM | POA: Diagnosis not present

## 2015-09-07 DIAGNOSIS — R2689 Other abnormalities of gait and mobility: Secondary | ICD-10-CM | POA: Diagnosis not present

## 2015-09-09 ENCOUNTER — Encounter: Payer: Self-pay | Admitting: Internal Medicine

## 2015-09-09 DIAGNOSIS — G309 Alzheimer's disease, unspecified: Secondary | ICD-10-CM | POA: Diagnosis not present

## 2015-09-09 DIAGNOSIS — R2689 Other abnormalities of gait and mobility: Secondary | ICD-10-CM | POA: Diagnosis not present

## 2015-09-09 NOTE — Assessment & Plan Note (Signed)
neurologically stable; continue plavix 75 m daily

## 2015-09-09 NOTE — Assessment & Plan Note (Signed)
Controlled ; continue enablex 7.5 mg daily

## 2015-09-09 NOTE — Assessment & Plan Note (Signed)
continue nuedexta 20-10 mg twice daily and will monitor

## 2015-09-10 DIAGNOSIS — G309 Alzheimer's disease, unspecified: Secondary | ICD-10-CM | POA: Diagnosis not present

## 2015-09-10 DIAGNOSIS — R2689 Other abnormalities of gait and mobility: Secondary | ICD-10-CM | POA: Diagnosis not present

## 2015-09-11 DIAGNOSIS — G309 Alzheimer's disease, unspecified: Secondary | ICD-10-CM | POA: Diagnosis not present

## 2015-09-11 DIAGNOSIS — R2689 Other abnormalities of gait and mobility: Secondary | ICD-10-CM | POA: Diagnosis not present

## 2015-09-12 DIAGNOSIS — R2689 Other abnormalities of gait and mobility: Secondary | ICD-10-CM | POA: Diagnosis not present

## 2015-09-12 DIAGNOSIS — G309 Alzheimer's disease, unspecified: Secondary | ICD-10-CM | POA: Diagnosis not present

## 2015-09-13 DIAGNOSIS — G309 Alzheimer's disease, unspecified: Secondary | ICD-10-CM | POA: Diagnosis not present

## 2015-09-13 DIAGNOSIS — R2689 Other abnormalities of gait and mobility: Secondary | ICD-10-CM | POA: Diagnosis not present

## 2015-09-17 DIAGNOSIS — G309 Alzheimer's disease, unspecified: Secondary | ICD-10-CM | POA: Diagnosis not present

## 2015-09-17 DIAGNOSIS — R2689 Other abnormalities of gait and mobility: Secondary | ICD-10-CM | POA: Diagnosis not present

## 2015-09-18 DIAGNOSIS — G309 Alzheimer's disease, unspecified: Secondary | ICD-10-CM | POA: Diagnosis not present

## 2015-09-18 DIAGNOSIS — R2689 Other abnormalities of gait and mobility: Secondary | ICD-10-CM | POA: Diagnosis not present

## 2015-09-19 DIAGNOSIS — R2689 Other abnormalities of gait and mobility: Secondary | ICD-10-CM | POA: Diagnosis not present

## 2015-09-19 DIAGNOSIS — G309 Alzheimer's disease, unspecified: Secondary | ICD-10-CM | POA: Diagnosis not present

## 2015-09-20 DIAGNOSIS — R2689 Other abnormalities of gait and mobility: Secondary | ICD-10-CM | POA: Diagnosis not present

## 2015-09-20 DIAGNOSIS — G309 Alzheimer's disease, unspecified: Secondary | ICD-10-CM | POA: Diagnosis not present

## 2015-09-21 DIAGNOSIS — G309 Alzheimer's disease, unspecified: Secondary | ICD-10-CM | POA: Diagnosis not present

## 2015-09-21 DIAGNOSIS — R2689 Other abnormalities of gait and mobility: Secondary | ICD-10-CM | POA: Diagnosis not present

## 2015-09-23 DIAGNOSIS — G309 Alzheimer's disease, unspecified: Secondary | ICD-10-CM | POA: Diagnosis not present

## 2015-09-23 DIAGNOSIS — R2689 Other abnormalities of gait and mobility: Secondary | ICD-10-CM | POA: Diagnosis not present

## 2015-09-24 DIAGNOSIS — G309 Alzheimer's disease, unspecified: Secondary | ICD-10-CM | POA: Diagnosis not present

## 2015-09-24 DIAGNOSIS — R2689 Other abnormalities of gait and mobility: Secondary | ICD-10-CM | POA: Diagnosis not present

## 2015-09-25 DIAGNOSIS — G309 Alzheimer's disease, unspecified: Secondary | ICD-10-CM | POA: Diagnosis not present

## 2015-09-25 DIAGNOSIS — R2689 Other abnormalities of gait and mobility: Secondary | ICD-10-CM | POA: Diagnosis not present

## 2015-09-27 DIAGNOSIS — R2689 Other abnormalities of gait and mobility: Secondary | ICD-10-CM | POA: Diagnosis not present

## 2015-09-27 DIAGNOSIS — G309 Alzheimer's disease, unspecified: Secondary | ICD-10-CM | POA: Diagnosis not present

## 2015-09-28 DIAGNOSIS — R2689 Other abnormalities of gait and mobility: Secondary | ICD-10-CM | POA: Diagnosis not present

## 2015-09-28 DIAGNOSIS — G309 Alzheimer's disease, unspecified: Secondary | ICD-10-CM | POA: Diagnosis not present

## 2015-10-02 DIAGNOSIS — G309 Alzheimer's disease, unspecified: Secondary | ICD-10-CM | POA: Diagnosis not present

## 2015-10-02 DIAGNOSIS — R2689 Other abnormalities of gait and mobility: Secondary | ICD-10-CM | POA: Diagnosis not present

## 2015-10-03 DIAGNOSIS — G309 Alzheimer's disease, unspecified: Secondary | ICD-10-CM | POA: Diagnosis not present

## 2015-10-03 DIAGNOSIS — R2689 Other abnormalities of gait and mobility: Secondary | ICD-10-CM | POA: Diagnosis not present

## 2015-10-04 DIAGNOSIS — R2689 Other abnormalities of gait and mobility: Secondary | ICD-10-CM | POA: Diagnosis not present

## 2015-10-04 DIAGNOSIS — G309 Alzheimer's disease, unspecified: Secondary | ICD-10-CM | POA: Diagnosis not present

## 2015-10-08 DIAGNOSIS — G309 Alzheimer's disease, unspecified: Secondary | ICD-10-CM | POA: Diagnosis not present

## 2015-10-08 DIAGNOSIS — R2689 Other abnormalities of gait and mobility: Secondary | ICD-10-CM | POA: Diagnosis not present

## 2015-10-09 DIAGNOSIS — M79672 Pain in left foot: Secondary | ICD-10-CM | POA: Diagnosis not present

## 2015-10-09 DIAGNOSIS — B351 Tinea unguium: Secondary | ICD-10-CM | POA: Diagnosis not present

## 2015-10-09 DIAGNOSIS — G309 Alzheimer's disease, unspecified: Secondary | ICD-10-CM | POA: Diagnosis not present

## 2015-10-09 DIAGNOSIS — M79671 Pain in right foot: Secondary | ICD-10-CM | POA: Diagnosis not present

## 2015-10-09 DIAGNOSIS — R2689 Other abnormalities of gait and mobility: Secondary | ICD-10-CM | POA: Diagnosis not present

## 2015-10-10 ENCOUNTER — Encounter: Payer: Self-pay | Admitting: Adult Health

## 2015-10-10 ENCOUNTER — Non-Acute Institutional Stay (SKILLED_NURSING_FACILITY): Payer: Medicare Other | Admitting: Adult Health

## 2015-10-10 DIAGNOSIS — N3281 Overactive bladder: Secondary | ICD-10-CM | POA: Diagnosis not present

## 2015-10-10 DIAGNOSIS — Z8673 Personal history of transient ischemic attack (TIA), and cerebral infarction without residual deficits: Secondary | ICD-10-CM | POA: Diagnosis not present

## 2015-10-10 DIAGNOSIS — I10 Essential (primary) hypertension: Secondary | ICD-10-CM

## 2015-10-10 DIAGNOSIS — G309 Alzheimer's disease, unspecified: Secondary | ICD-10-CM | POA: Diagnosis not present

## 2015-10-10 DIAGNOSIS — F482 Pseudobulbar affect: Secondary | ICD-10-CM

## 2015-10-10 DIAGNOSIS — F0280 Dementia in other diseases classified elsewhere without behavioral disturbance: Secondary | ICD-10-CM | POA: Diagnosis not present

## 2015-10-10 DIAGNOSIS — R569 Unspecified convulsions: Secondary | ICD-10-CM

## 2015-10-10 NOTE — Progress Notes (Signed)
Patient ID: Mike Williams, male   DOB: January 10, 1933, 82 y.o.   MRN: [id number]    Location:  [facility] Room Number: 107-A Place of Service:  SNF (31)   CODE STATUS: Full Code  No Known Allergies  Chief Complaint  Patient presents with  . Medical Management of Chronic Issues    Follow up    HPI:  He is a resident of this facility being seen for the management of his chronic illnesses. Overall there is little change in his status. He is unable to participate in the hpi or ros. There are no nursing concerns at this time.    Past Medical History  Diagnosis Date  . CEREBROVASCULAR ACCIDENT, HX OF 02/09/2007  . DEMENTIA 05/12/2007  . HYPERGLYCEMIA 09/21/2009  . HYPERTENSION 12/29/2006  . OBSTRUCTIVE SLEEP APNEA 12/29/2006  . PROSTATE CANCER, HX OF 12/29/2006  . Hemorrhoids   . Lactose intolerance   . Risk for falls   . Prostatitis   . Aortic root dilatation ([place])     Noted on 09/2010 echo, with mild-mod AI  . Seizures ([place])   . Aortic insufficiency 07/04/2011    2/13 Study Conclusions  - Left ventricle: Systolic function was normal. The estimated ejection fraction was in the range of 55% to 60%. - Aortic valve: Moderate regurgitation. - Aorta: The aorta was moderately dilated. Transthoracic echocardiography. M-mode, complete 2D, spectral Doppler, and color Doppler. Height: Height: 170.2cm. Height: 67in. Weight: Weight: 84.1kg. Weight: 185lb. Body mass index: BMI: 29kg/m^2. Body surface area: BSA: 2.54m^2. Blood pressure: 175/86. Patient status: Inpatient. Location: Bedside.        Past Surgical History  Procedure Laterality Date  . Cholecystectomy    . Prostate surgery      cryotherapy  . Ganglion cyst excision      Social History   Social History  . Marital Status: Married    Spouse Name: N/A  . Number of Children: 2  . Years of Education: master's   Occupational History  . retired    Social History Main Topics  . Smoking status: Never Smoker    . Smokeless tobacco: Never Used  . Alcohol Use: No  . Drug Use: No  . Sexual Activity: Not on file   Other Topics Concern  . Not on file   Social History Narrative   Married 52 years in 2016 to wife [name] (goes to [place] urgent care). 2 grown sons. 2 grandkids-boy and girl.       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       Patient does not drink caffeine.   Patient is right handed.   Family History  Problem Relation Age of Onset  . Heart attack Father   . Heart disease Father   . Cancer Sister       VITAL SIGNS BP 142/88 mmHg  Pulse 68  Temp(Src) 97.9 F (36.6 C) (Oral)  Resp 16  Ht 5\' 7"  (1.702 m)  Wt 147 lb (66.679 kg)  BMI 23.02 kg/m2  SpO2 97%  Patient's Medications  New Prescriptions   No medications on file  Previous Medications   BENICAR 20 MG TABLET    take 1 tablet by mouth once daily   CLOPIDOGREL (PLAVIX) 75 MG TABLET    take 1 tablet by mouth once daily   DARIFENACIN (ENABLEX) 7.5 MG 24 HR TABLET    Take 1 tablet (7.5 mg total) by mouth daily.   DEXTROMETHORPHAN-QUINIDINE (NUEDEXTA) 20-10 MG CAPS  Take 1 capsule by mouth 2 (two) times daily.   ESCITALOPRAM (LEXAPRO) 5 MG TABLET    Take 5 mg by mouth daily. For depression   FELODIPINE (PLENDIL) 10 MG 24 HR TABLET    Take 10 mg by mouth daily.   HYDRALAZINE (APRESOLINE) 50 MG TABLET    take 1 tablet by mouth three times a day   LEVETIRACETAM (KEPPRA) 250 MG TABLET    Take 1 tablet (250 mg total) by mouth 2 (two) times daily.  Modified Medications   No medications on file  Discontinued Medications   FELODIPINE (PLENDIL) 5 MG 24 HR TABLET    take 1 tablet by mouth once daily     SIGNIFICANT DIAGNOSTIC EXAMS  LABS REVIEWED:   07-05-15: wbc 6.6; hgb 13.8; hct 41.3; mcv 85.2; plt 236; glucose 86; bun 13.4; creat 0.92; k+ 4.1; na++144; liver normal albumin 3.5     Review of Systems  Unable to perform ROS: dementia    Physical Exam  Constitutional: No distress.  Eyes: Conjunctivae  are normal.  Neck: Neck supple. No JVD present. No thyromegaly present.  Cardiovascular: Normal rate, regular rhythm and intact distal pulses.   Respiratory: Effort normal and breath sounds normal. No respiratory distress. He has no wheezes.  GI: Soft. Bowel sounds are normal. He exhibits no distension. There is no tenderness.  Musculoskeletal: He exhibits no edema.  Able to move all extremities   Lymphadenopathy:    He has no cervical adenopathy.  Neurological: He is alert.  Skin: Skin is warm and dry. He is not diaphoretic.  Psychiatric: He has a normal mood and affect.        ASSESSMENT/ PLAN:  1. Hypertension: will continue benicar 20 mg daily; apresoline  50 mg three times daily  plendil  10 mg daily   2. Seizure: no reports of seizure present will continue keppra 250 mg twice daily   3. UI: will continue enablex 7.5 mg daily  4. CVA: is neurologically stable will continue plavix 75 m daily   5. Alzheimer's disease: is without significant change in status; is presently not on medications; will not make changes at this time; will monitor   6. PBA: will continue nuedexta 20-10 mg twice daily and will monitor     Ok Edwards NP [facility] Adult Medicine  Contact [phone] Monday through Friday 8am- 5pm  After hours call [phone]

## 2015-10-11 DIAGNOSIS — R2689 Other abnormalities of gait and mobility: Secondary | ICD-10-CM | POA: Diagnosis not present

## 2015-10-11 DIAGNOSIS — G309 Alzheimer's disease, unspecified: Secondary | ICD-10-CM | POA: Diagnosis not present

## 2015-11-16 ENCOUNTER — Non-Acute Institutional Stay (SKILLED_NURSING_FACILITY): Payer: Medicare Other | Admitting: Adult Health

## 2015-11-16 ENCOUNTER — Encounter: Payer: Self-pay | Admitting: Adult Health

## 2015-11-16 DIAGNOSIS — R569 Unspecified convulsions: Secondary | ICD-10-CM | POA: Diagnosis not present

## 2015-11-16 DIAGNOSIS — F482 Pseudobulbar affect: Secondary | ICD-10-CM | POA: Diagnosis not present

## 2015-11-16 DIAGNOSIS — N3281 Overactive bladder: Secondary | ICD-10-CM

## 2015-11-16 DIAGNOSIS — F329 Major depressive disorder, single episode, unspecified: Secondary | ICD-10-CM

## 2015-11-16 DIAGNOSIS — F0280 Dementia in other diseases classified elsewhere without behavioral disturbance: Secondary | ICD-10-CM | POA: Diagnosis not present

## 2015-11-16 DIAGNOSIS — G309 Alzheimer's disease, unspecified: Secondary | ICD-10-CM

## 2015-11-16 DIAGNOSIS — F32A Depression, unspecified: Secondary | ICD-10-CM

## 2015-11-16 DIAGNOSIS — I10 Essential (primary) hypertension: Secondary | ICD-10-CM

## 2015-11-16 DIAGNOSIS — Z8673 Personal history of transient ischemic attack (TIA), and cerebral infarction without residual deficits: Secondary | ICD-10-CM

## 2015-11-16 NOTE — Progress Notes (Signed)
Patient ID: Mike Williams, male   DOB: 18-Oct-1932, 82 y.o.   MRN: [id number]   Location:   [facility] Room Number: 107-A Place of Service:  SNF (31)   CODE STATUS: Full Code  No Known Allergies  Chief Complaint  Patient presents with  . Medical Management of Chronic Issues    Follow up    HPI:  He is a long term resident of this facility being seen for the management of his chronic illnesses. Overall there is little change in his status. He is unable to fully participate in the hpi or ros. There are no nursing concerns at this time.    Past Medical History  Diagnosis Date  . CEREBROVASCULAR ACCIDENT, HX OF 02/09/2007  . DEMENTIA 05/12/2007  . HYPERGLYCEMIA 09/21/2009  . HYPERTENSION 12/29/2006  . OBSTRUCTIVE SLEEP APNEA 12/29/2006  . PROSTATE CANCER, HX OF 12/29/2006  . Hemorrhoids   . Lactose intolerance   . Risk for falls   . Prostatitis   . Aortic root dilatation ([place])     Noted on 09/2010 echo, with mild-mod AI  . Seizures ([place])   . Aortic insufficiency 07/04/2011    2/13 Study Conclusions  - Left ventricle: Systolic function was normal. The estimated ejection fraction was in the range of 55% to 60%. - Aortic valve: Moderate regurgitation. - Aorta: The aorta was moderately dilated. Transthoracic echocardiography. M-mode, complete 2D, spectral Doppler, and color Doppler. Height: Height: 170.2cm. Height: 67in. Weight: Weight: 84.1kg. Weight: 185lb. Body mass index: BMI: 29kg/m^2. Body surface area: BSA: 2.45m^2. Blood pressure: 175/86. Patient status: Inpatient. Location: Bedside.        Past Surgical History  Procedure Laterality Date  . Cholecystectomy    . Prostate surgery      cryotherapy  . Ganglion cyst excision      Social History   Social History  . Marital Status: Married    Spouse Name: N/A  . Number of Children: 2  . Years of Education: master's   Occupational History  . retired    Social History Main Topics  . Smoking status: Never Smoker   .  Smokeless tobacco: Never Used  . Alcohol Use: No  . Drug Use: No  . Sexual Activity: Not on file   Other Topics Concern  . Not on file   Social History Narrative   Married 52 years in 2016 to wife [name] (goes to [place] urgent care). 2 grown sons. 2 grandkids-boy and girl.       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       Patient does not drink caffeine.   Patient is right handed.   Family History  Problem Relation Age of Onset  . Heart attack Father   . Heart disease Father   . Cancer Sister       VITAL SIGNS BP 127/87 mmHg  Pulse 77  Temp(Src) 97.8 F (36.6 C) (Oral)  Resp 16  Ht 5\' 7"  (1.702 m)  Wt 147 lb (66.679 kg)  BMI 23.02 kg/m2  SpO2 97%  Patient's Medications  New Prescriptions   No medications on file  Previous Medications   BENICAR 20 MG TABLET    take 1 tablet by mouth once daily   CLOPIDOGREL (PLAVIX) 75 MG TABLET    take 1 tablet by mouth once daily   DARIFENACIN (ENABLEX) 7.5 MG 24 HR TABLET    Take 1 tablet (7.5 mg total) by mouth daily.   DEXTROMETHORPHAN-QUINIDINE (NUEDEXTA) 20-10 MG CAPS  Take 1 capsule by mouth 2 (two) times daily.   ESCITALOPRAM (LEXAPRO) 5 MG TABLET    Take 5 mg by mouth daily. For depression   FELODIPINE (PLENDIL) 10 MG 24 HR TABLET    Take 10 mg by mouth daily.   HYDRALAZINE (APRESOLINE) 50 MG TABLET    take 1 tablet by mouth three times a day   LEVETIRACETAM (KEPPRA) 250 MG TABLET    Take 1 tablet (250 mg total) by mouth 2 (two) times daily.  Modified Medications   No medications on file  Discontinued Medications   No medications on file     SIGNIFICANT DIAGNOSTIC EXAMS  LABS REVIEWED:   07-05-15: wbc 6.6; hgb 13.8; hct 41.3; mcv 85.2; plt 236; glucose 86; bun 13.4; creat 0.92; k+ 4.1; na++144; liver normal albumin 3.5     Review of Systems  Unable to perform ROS: dementia    Physical Exam  Constitutional: No distress.  Eyes: Conjunctivae are normal.  Neck: Neck supple. No JVD present. No  thyromegaly present.  Cardiovascular: Normal rate, regular rhythm and intact distal pulses.   Respiratory: Effort normal and breath sounds normal. No respiratory distress. He has no wheezes.  GI: Soft. Bowel sounds are normal. He exhibits no distension. There is no tenderness.  Musculoskeletal: He exhibits no edema.  Able to move all extremities   Lymphadenopathy:    He has no cervical adenopathy.  Neurological: He is alert.  Skin: Skin is warm and dry. He is not diaphoretic.  Psychiatric: He has a normal mood and affect.        ASSESSMENT/ PLAN:  1. Hypertension: will continue benicar 20 mg daily; apresoline  50 mg three times daily  plendil  10 mg daily   2. Seizure: no reports of seizure present will continue keppra 250 mg twice daily   3. UI: will continue enablex 7.5 mg daily  4. CVA: is neurologically stable will continue plavix 75 mg daily   5. Alzheimer's disease: is without significant change in status; is presently not on medications; will not make changes at this time;his current weight is 147 pounds.  will monitor   6. PBA: will continue nuedexta 20-10 mg twice daily and will monitor   7. Depression: will continue lexapro 5 mg daily     Ok Edwards NP [facility] Adult Medicine  Contact [phone] Monday through Friday 8am- 5pm  After hours call [phone]

## 2015-12-01 DIAGNOSIS — F32A Depression, unspecified: Secondary | ICD-10-CM | POA: Insufficient documentation

## 2015-12-01 DIAGNOSIS — F329 Major depressive disorder, single episode, unspecified: Secondary | ICD-10-CM | POA: Insufficient documentation

## 2015-12-13 DIAGNOSIS — R69 Illness, unspecified: Secondary | ICD-10-CM | POA: Diagnosis not present

## 2015-12-18 ENCOUNTER — Encounter: Payer: Self-pay | Admitting: Adult Health

## 2015-12-18 ENCOUNTER — Non-Acute Institutional Stay (SKILLED_NURSING_FACILITY): Payer: Medicare Other | Admitting: Adult Health

## 2015-12-18 DIAGNOSIS — G309 Alzheimer's disease, unspecified: Secondary | ICD-10-CM

## 2015-12-18 DIAGNOSIS — R569 Unspecified convulsions: Secondary | ICD-10-CM

## 2015-12-18 DIAGNOSIS — F482 Pseudobulbar affect: Secondary | ICD-10-CM

## 2015-12-18 DIAGNOSIS — Z8673 Personal history of transient ischemic attack (TIA), and cerebral infarction without residual deficits: Secondary | ICD-10-CM | POA: Diagnosis not present

## 2015-12-18 DIAGNOSIS — I351 Nonrheumatic aortic (valve) insufficiency: Secondary | ICD-10-CM | POA: Diagnosis not present

## 2015-12-18 DIAGNOSIS — I10 Essential (primary) hypertension: Secondary | ICD-10-CM

## 2015-12-18 DIAGNOSIS — F0280 Dementia in other diseases classified elsewhere without behavioral disturbance: Secondary | ICD-10-CM | POA: Diagnosis not present

## 2015-12-18 NOTE — Progress Notes (Signed)
Patient ID: Mike Williams, male   DOB: May 17, 1932, 82 y.o.   MRN: [id number]   Location:   [facility] Room Number: 107-A Place of Service:  SNF (31)   CODE STATUS: Full Code  No Known Allergies  Chief Complaint  Patient presents with  . Medical Management of Chronic Issues    Follow up    HPI:  He is a long term resident of this facility being seen for the management of his chronic illnesses. Overall there is little change in his status. He does get out of bed on a daily basis. He is unable to participate in the hpi or ros. There are no nursing concerns at this time.    Past Medical History:  Diagnosis Date  . Aortic insufficiency 07/04/2011   2/13 Study Conclusions  - Left ventricle: Systolic function was normal. The estimated ejection fraction was in the range of 55% to 60%. - Aortic valve: Moderate regurgitation. - Aorta: The aorta was moderately dilated. Transthoracic echocardiography. M-mode, complete 2D, spectral Doppler, and color Doppler. Height: Height: 170.2cm. Height: 67in. Weight: Weight: 84.1kg. Weight: 185lb. Body mass index: BMI: 29kg/m^2. Body surface area: BSA: 2.68m^2. Blood pressure: 175/86. Patient status: Inpatient. Location: Bedside.      . Aortic root dilatation (HCC)    Noted on 09/2010 echo, with mild-mod AI  . CEREBROVASCULAR ACCIDENT, HX OF 02/09/2007  . DEMENTIA 05/12/2007  . Hemorrhoids   . HYPERGLYCEMIA 09/21/2009  . HYPERTENSION 12/29/2006  . Lactose intolerance   . OBSTRUCTIVE SLEEP APNEA 12/29/2006  . PROSTATE CANCER, HX OF 12/29/2006  . Prostatitis   . Risk for falls   . Seizures ([place])     Past Surgical History:  Procedure Laterality Date  . CHOLECYSTECTOMY    . GANGLION CYST EXCISION    . PROSTATE SURGERY     cryotherapy    Social History   Social History  . Marital status: Married    Spouse name: N/A  . Number of children: 2  . Years of education: master's   Occupational History  . retired    Social History Main Topics  .  Smoking status: Never Smoker  . Smokeless tobacco: Never Used  . Alcohol use No  . Drug use: No  . Sexual activity: Not on file   Other Topics Concern  . Not on file   Social History Narrative   Married 52 years in 2016 to wife [name] (goes to [place] urgent care). 2 grown sons. 2 grandkids-boy and girl.       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       Patient does not drink caffeine.   Patient is right handed.   Family History  Problem Relation Age of Onset  . Heart attack Father   . Heart disease Father   . Cancer Sister       VITAL SIGNS BP (!) 172/91   Pulse 69   Temp (!) 91.3 F (32.9 C) (Oral)   Resp 18   Ht 5\' 7"  (1.702 m)   Wt 149 lb (67.6 kg)   SpO2 96%   BMI 23.34 kg/m   Patient's Medications  New Prescriptions   No medications on file  Previous Medications   BENICAR 20 MG TABLET    take 1 tablet by mouth once daily   CLOPIDOGREL (PLAVIX) 75 MG TABLET    take 1 tablet by mouth once daily   DARIFENACIN (ENABLEX) 7.5 MG 24 HR TABLET    Take 1 tablet (7.5  mg total) by mouth daily.   DEXTROMETHORPHAN-QUINIDINE (NUEDEXTA) 20-10 MG CAPS    Take 1 capsule by mouth 2 (two) times daily.   ESCITALOPRAM (LEXAPRO) 5 MG TABLET    Take 5 mg by mouth daily. For depression   FELODIPINE (PLENDIL) 10 MG 24 HR TABLET    Take 10 mg by mouth daily.   HYDRALAZINE (APRESOLINE) 50 MG TABLET    take 1 tablet by mouth three times a day   LEVETIRACETAM (KEPPRA) 250 MG TABLET    Take 1 tablet (250 mg total) by mouth 2 (two) times daily.  Modified Medications   No medications on file  Discontinued Medications   No medications on file     SIGNIFICANT DIAGNOSTIC EXAMS  LABS REVIEWED:   07-05-15: wbc 6.6; hgb 13.8; hct 41.3; mcv 85.2; plt 236; glucose 86; bun 13.4; creat 0.92; k+ 4.1; na++144; liver normal albumin 3.5     Review of Systems  Unable to perform ROS: dementia    Physical Exam  Constitutional: No distress.  Eyes: Conjunctivae are normal.  Neck:  Neck supple. No JVD present. No thyromegaly present.  Cardiovascular: Normal rate, regular rhythm and intact distal pulses.   Respiratory: Effort normal and breath sounds normal. No respiratory distress. He has no wheezes.  GI: Soft. Bowel sounds are normal. He exhibits no distension. There is no tenderness.  Musculoskeletal: He exhibits no edema.  Able to move all extremities   Lymphadenopathy:    He has no cervical adenopathy.  Neurological: He is alert.  Skin: Skin is warm and dry. He is not diaphoretic.  Psychiatric: He has a normal mood and affect.        ASSESSMENT/ PLAN:  1. Hypertension: has aortic insufficiency   will continue benicar 20 mg daily; apresoline  50 mg three times daily  plendil  10 mg daily   2. Seizure: no reports of seizure present will continue keppra 250 mg twice daily   3. UI: will continue enablex 7.5 mg daily  4. CVA: is neurologically stable will continue plavix 75 mg daily   5. Alzheimer's disease: is without significant change in status; is presently not on medications; will not make changes at this time;his current weight is 147 pounds.  will monitor   6. PBA: will continue nuedexta 20-10 mg twice daily and will monitor   7. Depression: will continue lexapro 5 mg daily        Ok Edwards NP [facility]  Contact [phone] Monday through Friday 8am- 5pm  After hours call [phone]

## 2015-12-23 ENCOUNTER — Inpatient Hospital Stay (HOSPITAL_COMMUNITY)
Admission: EM | Admit: 2015-12-23 | Discharge: 2015-12-26 | DRG: 690 | Disposition: A | Discharge status: Skilled Nursing Facility | Payer: Medicare Other | Attending: Internal Medicine | Admitting: Internal Medicine

## 2015-12-23 ENCOUNTER — Emergency Department (HOSPITAL_COMMUNITY): Payer: Medicare Other

## 2015-12-23 ENCOUNTER — Encounter (HOSPITAL_COMMUNITY): Payer: Self-pay | Admitting: Emergency Medicine

## 2015-12-23 DIAGNOSIS — I10 Essential (primary) hypertension: Secondary | ICD-10-CM | POA: Diagnosis present

## 2015-12-23 DIAGNOSIS — N179 Acute kidney failure, unspecified: Secondary | ICD-10-CM | POA: Diagnosis present

## 2015-12-23 DIAGNOSIS — Z79899 Other long term (current) drug therapy: Secondary | ICD-10-CM

## 2015-12-23 DIAGNOSIS — F0280 Dementia in other diseases classified elsewhere without behavioral disturbance: Secondary | ICD-10-CM | POA: Diagnosis present

## 2015-12-23 DIAGNOSIS — M6281 Muscle weakness (generalized): Secondary | ICD-10-CM | POA: Diagnosis not present

## 2015-12-23 DIAGNOSIS — Z8249 Family history of ischemic heart disease and other diseases of the circulatory system: Secondary | ICD-10-CM

## 2015-12-23 DIAGNOSIS — I77810 Thoracic aortic ectasia: Secondary | ICD-10-CM | POA: Diagnosis present

## 2015-12-23 DIAGNOSIS — R569 Unspecified convulsions: Secondary | ICD-10-CM

## 2015-12-23 DIAGNOSIS — G40909 Epilepsy, unspecified, not intractable, without status epilepticus: Secondary | ICD-10-CM | POA: Diagnosis present

## 2015-12-23 DIAGNOSIS — I6789 Other cerebrovascular disease: Secondary | ICD-10-CM | POA: Diagnosis not present

## 2015-12-23 DIAGNOSIS — E860 Dehydration: Secondary | ICD-10-CM | POA: Diagnosis present

## 2015-12-23 DIAGNOSIS — R3129 Other microscopic hematuria: Secondary | ICD-10-CM | POA: Diagnosis present

## 2015-12-23 DIAGNOSIS — R4182 Altered mental status, unspecified: Secondary | ICD-10-CM

## 2015-12-23 DIAGNOSIS — E876 Hypokalemia: Secondary | ICD-10-CM | POA: Diagnosis present

## 2015-12-23 DIAGNOSIS — Z7902 Long term (current) use of antithrombotics/antiplatelets: Secondary | ICD-10-CM

## 2015-12-23 DIAGNOSIS — G4733 Obstructive sleep apnea (adult) (pediatric): Secondary | ICD-10-CM | POA: Diagnosis present

## 2015-12-23 DIAGNOSIS — R41841 Cognitive communication deficit: Secondary | ICD-10-CM | POA: Diagnosis not present

## 2015-12-23 DIAGNOSIS — G40009 Localization-related (focal) (partial) idiopathic epilepsy and epileptic syndromes with seizures of localized onset, not intractable, without status epilepticus: Secondary | ICD-10-CM | POA: Diagnosis not present

## 2015-12-23 DIAGNOSIS — E785 Hyperlipidemia, unspecified: Secondary | ICD-10-CM | POA: Diagnosis present

## 2015-12-23 DIAGNOSIS — D649 Anemia, unspecified: Secondary | ICD-10-CM | POA: Diagnosis present

## 2015-12-23 DIAGNOSIS — Q231 Congenital insufficiency of aortic valve: Secondary | ICD-10-CM | POA: Diagnosis not present

## 2015-12-23 DIAGNOSIS — F329 Major depressive disorder, single episode, unspecified: Secondary | ICD-10-CM | POA: Diagnosis not present

## 2015-12-23 DIAGNOSIS — F445 Conversion disorder with seizures or convulsions: Secondary | ICD-10-CM | POA: Diagnosis not present

## 2015-12-23 DIAGNOSIS — F482 Pseudobulbar affect: Secondary | ICD-10-CM | POA: Diagnosis present

## 2015-12-23 DIAGNOSIS — N390 Urinary tract infection, site not specified: Secondary | ICD-10-CM | POA: Diagnosis present

## 2015-12-23 DIAGNOSIS — R1312 Dysphagia, oropharyngeal phase: Secondary | ICD-10-CM | POA: Diagnosis not present

## 2015-12-23 DIAGNOSIS — N3000 Acute cystitis without hematuria: Secondary | ICD-10-CM | POA: Diagnosis not present

## 2015-12-23 DIAGNOSIS — R29810 Facial weakness: Secondary | ICD-10-CM | POA: Diagnosis not present

## 2015-12-23 DIAGNOSIS — R2689 Other abnormalities of gait and mobility: Secondary | ICD-10-CM | POA: Diagnosis not present

## 2015-12-23 DIAGNOSIS — R29818 Other symptoms and signs involving the nervous system: Secondary | ICD-10-CM | POA: Diagnosis not present

## 2015-12-23 DIAGNOSIS — G309 Alzheimer's disease, unspecified: Secondary | ICD-10-CM | POA: Diagnosis not present

## 2015-12-23 DIAGNOSIS — I6990 Unspecified sequelae of unspecified cerebrovascular disease: Secondary | ICD-10-CM | POA: Diagnosis not present

## 2015-12-23 DIAGNOSIS — Z8546 Personal history of malignant neoplasm of prostate: Secondary | ICD-10-CM

## 2015-12-23 DIAGNOSIS — I351 Nonrheumatic aortic (valve) insufficiency: Secondary | ICD-10-CM | POA: Diagnosis present

## 2015-12-23 DIAGNOSIS — Z8673 Personal history of transient ischemic attack (TIA), and cerebral infarction without residual deficits: Secondary | ICD-10-CM

## 2015-12-23 LAB — COMPREHENSIVE METABOLIC PANEL
ALK PHOS: 83 U/L (ref 38–126)
ALT: 14 U/L — ABNORMAL LOW (ref 17–63)
ANION GAP: 9 (ref 5–15)
AST: 18 U/L (ref 15–41)
Albumin: 3.2 g/dL — ABNORMAL LOW (ref 3.5–5.0)
BUN: 16 mg/dL (ref 6–20)
CALCIUM: 8.8 mg/dL — AB (ref 8.9–10.3)
CO2: 25 mmol/L (ref 22–32)
Chloride: 107 mmol/L (ref 101–111)
Creatinine, Ser: 1.33 mg/dL — ABNORMAL HIGH (ref 0.61–1.24)
GFR calc non Af Amer: 49 mL/min — ABNORMAL LOW (ref >60)
GFR, EST AFRICAN AMERICAN: 57 mL/min — AB (ref >60)
Glucose, Bld: 139 mg/dL — ABNORMAL HIGH (ref 65–99)
POTASSIUM: 3.6 mmol/L (ref 3.5–5.1)
SODIUM: 141 mmol/L (ref 135–145)
TOTAL PROTEIN: 7.1 g/dL (ref 6.5–8.1)
Total Bilirubin: 0.8 mg/dL (ref 0.3–1.2)

## 2015-12-23 LAB — RAPID URINE DRUG SCREEN, HOSP PERFORMED
AMPHETAMINES: NOT DETECTED
Barbiturates: NOT DETECTED
Benzodiazepines: NOT DETECTED
COCAINE: NOT DETECTED
OPIATES: NOT DETECTED
Tetrahydrocannabinol: NOT DETECTED

## 2015-12-23 LAB — I-STAT CHEM 8, ED
BUN: 22 mg/dL — ABNORMAL HIGH (ref 6–20)
CALCIUM ION: 0.96 mmol/L — AB (ref 1.12–1.23)
Chloride: 107 mmol/L (ref 101–111)
Creatinine, Ser: 1.4 mg/dL — ABNORMAL HIGH (ref 0.61–1.24)
Glucose, Bld: 134 mg/dL — ABNORMAL HIGH (ref 65–99)
HEMATOCRIT: 40 % (ref 39.0–52.0)
HEMOGLOBIN: 13.6 g/dL (ref 13.0–17.0)
Potassium: 4.4 mmol/L (ref 3.5–5.1)
SODIUM: 143 mmol/L (ref 135–145)
TCO2: 27 mmol/L (ref 0–100)

## 2015-12-23 LAB — I-STAT TROPONIN, ED: Troponin i, poc: 0 ng/mL (ref 0.00–0.08)

## 2015-12-23 LAB — DIFFERENTIAL
Basophils Absolute: 0 10*3/uL (ref 0.0–0.1)
Basophils Relative: 0 %
EOS ABS: 0 10*3/uL (ref 0.0–0.7)
EOS PCT: 0 %
LYMPHS PCT: 15 %
Lymphs Abs: 2.6 10*3/uL (ref 0.7–4.0)
MONO ABS: 1.2 10*3/uL — AB (ref 0.1–1.0)
Monocytes Relative: 7 %
NEUTROS PCT: 78 %
Neutro Abs: 13.5 10*3/uL — ABNORMAL HIGH (ref 1.7–7.7)

## 2015-12-23 LAB — CBC
HCT: 39.9 % (ref 39.0–52.0)
Hemoglobin: 12.8 g/dL — ABNORMAL LOW (ref 13.0–17.0)
MCH: 28.4 pg (ref 26.0–34.0)
MCHC: 32.1 g/dL (ref 30.0–36.0)
MCV: 88.5 fL (ref 78.0–100.0)
PLATELETS: 180 10*3/uL (ref 150–400)
RBC: 4.51 MIL/uL (ref 4.22–5.81)
RDW: 14.2 % (ref 11.5–15.5)
WBC: 17.4 10*3/uL — ABNORMAL HIGH (ref 4.0–10.5)

## 2015-12-23 LAB — I-STAT CG4 LACTIC ACID, ED: Lactic Acid, Venous: 1.96 mmol/L (ref 0.5–1.9)

## 2015-12-23 LAB — CBG MONITORING, ED: GLUCOSE-CAPILLARY: 143 mg/dL — AB (ref 65–99)

## 2015-12-23 LAB — PROTIME-INR
INR: 1.16
PROTHROMBIN TIME: 14.8 s (ref 11.4–15.2)

## 2015-12-23 LAB — URINE MICROSCOPIC-ADD ON

## 2015-12-23 LAB — URINALYSIS, ROUTINE W REFLEX MICROSCOPIC
Bilirubin Urine: NEGATIVE
Glucose, UA: NEGATIVE mg/dL
Ketones, ur: NEGATIVE mg/dL
NITRITE: NEGATIVE
PH: 6.5 (ref 5.0–8.0)
Protein, ur: 30 mg/dL — AB
SPECIFIC GRAVITY, URINE: 1.017 (ref 1.005–1.030)

## 2015-12-23 LAB — APTT: aPTT: 32 seconds (ref 24–36)

## 2015-12-23 LAB — MRSA PCR SCREENING: MRSA BY PCR: NEGATIVE

## 2015-12-23 LAB — ETHANOL

## 2015-12-23 MED ORDER — STROKE: EARLY STAGES OF RECOVERY BOOK
Freq: Once | Status: AC
  Administered 2015-12-23: 19:00:00
  Filled 2015-12-23: qty 1

## 2015-12-23 MED ORDER — HYDRALAZINE HCL 20 MG/ML IJ SOLN
5.0000 mg | Freq: Four times a day (QID) | INTRAMUSCULAR | Status: DC | PRN
  Administered 2015-12-23 – 2015-12-26 (×4): 5 mg via INTRAVENOUS
  Filled 2015-12-23 (×5): qty 1

## 2015-12-23 MED ORDER — LORAZEPAM 2 MG/ML IJ SOLN
1.0000 mg | Freq: Once | INTRAMUSCULAR | Status: AC
  Administered 2015-12-23: 1 mg via INTRAVENOUS
  Filled 2015-12-23: qty 1

## 2015-12-23 MED ORDER — DEXTROSE 5 % IV SOLN
1.0000 g | INTRAVENOUS | Status: DC
  Administered 2015-12-23 – 2015-12-25 (×3): 1 g via INTRAVENOUS
  Filled 2015-12-23 (×5): qty 10

## 2015-12-23 MED ORDER — SODIUM CHLORIDE 0.9 % IV SOLN
INTRAVENOUS | Status: AC
  Administered 2015-12-23 – 2015-12-24 (×2): via INTRAVENOUS

## 2015-12-23 MED ORDER — ASPIRIN 300 MG RE SUPP
300.0000 mg | Freq: Every day | RECTAL | Status: DC
  Administered 2015-12-23 – 2015-12-24 (×2): 300 mg via RECTAL
  Filled 2015-12-23 (×2): qty 1

## 2015-12-23 MED ORDER — SODIUM CHLORIDE 0.9 % IV SOLN
1000.0000 mg | INTRAVENOUS | Status: AC
  Administered 2015-12-23: 1000 mg via INTRAVENOUS
  Filled 2015-12-23: qty 10

## 2015-12-23 MED ORDER — SODIUM CHLORIDE 0.9 % IV SOLN: INTRAVENOUS | Status: AC

## 2015-12-23 MED ORDER — HEPARIN SODIUM (PORCINE) 5000 UNIT/ML IJ SOLN
5000.0000 [IU] | Freq: Three times a day (TID) | INTRAMUSCULAR | Status: DC
Start: 2015-12-23 — End: 2015-12-26
  Administered 2015-12-23 – 2015-12-26 (×9): 5000 [IU] via SUBCUTANEOUS
  Filled 2015-12-23 (×8): qty 1

## 2015-12-23 NOTE — ED Provider Notes (Signed)
[facility] DEPT Provider Note   CSN: [id number] Arrival date & time: 12/23/15  1307   An emergency department physician performed an initial assessment on this suspected stroke patient at 1307.  History   Chief Complaint Chief Complaint  Patient presents with  . Code Stroke    HPI Mike Williams is a 82 y.o. male.  HPI Patient presents as code stroke. Coming from nursing home facility. Last seen normal at 11:00 today by staff. Found unresponsive. EMS on scene. Normal CBG. Low 5 caveat applies. Past Medical History:  Diagnosis Date  . Aortic insufficiency 07/04/2011   2/13 Study Conclusions  - Left ventricle: Systolic function was normal. The estimated ejection fraction was in the range of 55% to 60%. - Aortic valve: Moderate regurgitation. - Aorta: The aorta was moderately dilated. Transthoracic echocardiography. M-mode, complete 2D, spectral Doppler, and color Doppler. Height: Height: 170.2cm. Height: 67in. Weight: Weight: 84.1kg. Weight: 185lb. Body mass index: BMI: 29kg/m^2. Body surface area: BSA: 2.2m^2. Blood pressure: 175/86. Patient status: Inpatient. Location: Bedside.      . Aortic root dilatation (HCC)    Noted on 09/2010 echo, with mild-mod AI  . CEREBROVASCULAR ACCIDENT, HX OF 02/09/2007  . DEMENTIA 05/12/2007  . Hemorrhoids   . HYPERGLYCEMIA 09/21/2009  . HYPERTENSION 12/29/2006  . Lactose intolerance   . OBSTRUCTIVE SLEEP APNEA 12/29/2006  . PROSTATE CANCER, HX OF 12/29/2006  . Prostatitis   . Risk for falls   . Seizures [facility])     Patient Active Problem List   Diagnosis Date Noted  . Seizure ([place]) 12/23/2015  . AKI (acute kidney injury) ([place]) 12/23/2015  . Depression 12/01/2015  . PBA (pseudobulbar affect) 08/08/2015  . Overactive bladder 06/29/2014  . Seizures ([place]) 02/01/2013  . Aortic insufficiency 07/04/2011  . Alzheimer's dementia 05/12/2007  . History of CVA (cerebrovascular accident) 02/09/2007  . Obstructive sleep apnea 12/29/2006  . Essential  hypertension 12/29/2006  . PROSTATE CANCER, HX OF 12/29/2006    Past Surgical History:  Procedure Laterality Date  . CHOLECYSTECTOMY    . GANGLION CYST EXCISION    . PROSTATE SURGERY     cryotherapy       Home Medications    Prior to Admission medications   Medication Sig Start Date End Date Taking? Authorizing Provider  BENICAR 20 MG tablet take 1 tablet by mouth once daily Patient taking differently: take 1 tablet (20 mg) by mouth once daily 04/14/15  Yes Marin Olp, MD  clopidogrel (PLAVIX) 75 MG tablet take 1 tablet by mouth once daily Patient taking differently: take 1 tablet (75 mg) by mouth once daily 11/28/14  Yes Marin Olp, MD  darifenacin (ENABLEX) 7.5 MG 24 hr tablet Take 1 tablet (7.5 mg total) by mouth daily. 06/29/14  Yes Marin Olp, MD  Dextromethorphan-Quinidine (NUEDEXTA) 20-10 MG CAPS Take 1 capsule by mouth 2 (two) times daily.   Yes Historical Provider, MD  escitalopram (LEXAPRO) 5 MG tablet Take 5 mg by mouth daily. For depression   Yes Historical Provider, MD  felodipine (PLENDIL) 10 MG 24 hr tablet Take 10 mg by mouth daily.   Yes Historical Provider, MD  hydrALAZINE (APRESOLINE) 50 MG tablet take 1 tablet by mouth three times a day Patient taking differently: take 1 tablet (50 mg) by mouth three times a day 01/30/15  Yes Marin Olp, MD  levETIRAcetam (KEPPRA) 250 MG tablet Take 1 tablet (250 mg total) by mouth 2 (two) times daily. 06/21/15  Yes Marin Olp, MD  Skin Protectants, Misc. (CALAZIME SKIN PROTECTANT) PSTE Apply 1 application topically 2 (two) times daily.   Yes Historical Provider, MD    Family History Family History  Problem Relation Age of Onset  . Heart attack Father   . Heart disease Father   . Cancer Sister     Social History Social History  Substance Use Topics  . Smoking status: Never Smoker  . Smokeless tobacco: Never Used  . Alcohol use No     Allergies   Review of patient's allergies indicates no  known allergies.   Review of Systems Review of Systems  Unable to perform ROS: Mental status change     Physical Exam Updated Vital Signs BP (!) 154/52 (BP Location: Left Arm)   Pulse 60   Temp 98 F (36.7 C) (Rectal)   Resp 15   Ht 5\' 7"  (1.702 m)   Wt 147 lb 7.8 oz (66.9 kg)   SpO2 96%   BMI 23.10 kg/m   Physical Exam  Constitutional: He appears well-developed and well-nourished.  Lying with eyes closed. Not following commands.  HENT:  Head: Normocephalic and atraumatic.  Mouth/Throat: Oropharynx is clear and moist.  Eyes:  Actively resisting attempting to open eyelids and view pupils.  Neck: Normal range of motion. Neck supple.  Cardiovascular: Normal rate and regular rhythm.   Pulmonary/Chest: Effort normal and breath sounds normal.  Abdominal: Soft. Bowel sounds are normal. There is no tenderness. There is no rebound and no guarding.  Musculoskeletal: Normal range of motion. He exhibits no edema or tenderness.  No lower extremity swelling or asymmetry. Distal pulses are intact.  Neurological:  Patient is moving all extremities. Questionable right facial droop. Difficult to test sensation.  Skin: Skin is warm and dry. Capillary refill takes less than 2 seconds. No rash noted. No erythema.  Nursing note and vitals reviewed.    ED Treatments / Results  Labs (all labs ordered are listed, but only abnormal results are displayed) Labs Reviewed  CBC - Abnormal; Notable for the following:       Result Value   WBC 17.4 (*)    Hemoglobin 12.8 (*)    All other components within normal limits  DIFFERENTIAL - Abnormal; Notable for the following:    Neutro Abs 13.5 (*)    Monocytes Absolute 1.2 (*)    All other components within normal limits  COMPREHENSIVE METABOLIC PANEL - Abnormal; Notable for the following:    Glucose, Bld 139 (*)    Creatinine, Ser 1.33 (*)    Calcium 8.8 (*)    Albumin 3.2 (*)    ALT 14 (*)    GFR calc non Af Amer 49 (*)    GFR calc Af Amer  57 (*)    All other components within normal limits  I-STAT CHEM 8, ED - Abnormal; Notable for the following:    BUN 22 (*)    Creatinine, Ser 1.40 (*)    Glucose, Bld 134 (*)    Calcium, Ion 0.96 (*)    All other components within normal limits  I-STAT CG4 LACTIC ACID, ED - Abnormal; Notable for the following:    Lactic Acid, Venous 1.96 (*)    All other components within normal limits  CBG MONITORING, ED - Abnormal; Notable for the following:    Glucose-Capillary 143 (*)    All other components within normal limits  ETHANOL  PROTIME-INR  APTT  URINE RAPID DRUG SCREEN, HOSP PERFORMED  URINALYSIS, ROUTINE W REFLEX MICROSCOPIC (NOT  AT [facility])  URINALYSIS, ROUTINE W REFLEX MICROSCOPIC (NOT AT [facility])  I-STAT TROPOININ, ED    EKG  EKG Interpretation  Date/Time:  Sunday December 23 2015 13:36:58 EDT Ventricular Rate:  62 PR Interval:    QRS Duration: 107 QT Interval:  537 QTC Calculation: 546 R Axis:   73 Text Interpretation:  Sinus rhythm Short PR interval Abnrm T, consider ischemia, anterolateral lds Prolonged QT interval Confirmed by Lita Mains  MD, Casie Sturgeon ([id number]) on 12/23/2015 1:52:27 PM       Radiology Ct Head Code Stroke W/o Cm  Result Date: 12/23/2015 CLINICAL DATA:  Code stroke.  Patient unresponsive and nonverbal. EXAM: CT HEAD WITHOUT CONTRAST TECHNIQUE: Contiguous axial images were obtained from the base of the skull through the vertex without intravenous contrast. COMPARISON:  09/23/2014 CT.  Most recent MR 07/02/2011. FINDINGS: No evidence for acute cortical infarct, acute hemorrhage, mass lesion, or extra-axial fluid. Global atrophy with hydrocephalus ex vacuo. Chronic microvascular ischemic change of an extensive nature. Scattered areas of lacunar infarction. No proximal large vessel hyperdensity to suggest emergent large vessel occlusion. Vascular calcification. Calvarium intact. No sinus or mastoid disease. Compared with priors, similar appearance. ASPECTS [facility] Stroke  Program Early CT Score) - Ganglionic level infarction (caudate, lentiform nuclei, internal capsule, insula, M1-M3 cortex): 7 - Supraganglionic infarction (M4-M6 cortex): 3 Total score (0-10 with 10 being normal): 10 IMPRESSION: 1. Global atrophy with extensive chronic microvascular ischemic change. No acute intracranial findings.  Grossly stable from priors. 2. ASPECTS is 10. These results were called by telephone at the time of interpretation on 12/23/2015 at 1:47 pm to Dr. Julianne Rice , who verbally acknowledged these results. Electronically Signed   By: Staci Righter [name]   On: 12/23/2015 13:49    Procedures Procedures (including critical care time)  Medications Ordered in ED Medications   stroke: mapping our early stages of recovery book (not administered)  heparin injection 5,000 Units (not administered)  aspirin suppository 300 mg (not administered)  0.9 %  sodium chloride infusion (not administered)  hydrALAZINE (APRESOLINE) injection 5 mg (not administered)  levETIRAcetam (KEPPRA) 1,000 mg in sodium chloride 0.9 % 100 mL IVPB (1,000 mg Intravenous New Bag/Given 12/23/15 1359)  LORazepam (ATIVAN) injection 1 mg (1 mg Intravenous Given 12/23/15 1348)   CRITICAL CARE Performed by: Lita Mains, Nori Poland Total critical care time: 30 minutes Critical care time was exclusive of separately billable procedures and treating other patients. Critical care was necessary to treat or prevent imminent or life-threatening deterioration. Critical care was time spent personally by me on the following activities: development of treatment plan with patient and/or surrogate as well as nursing, discussions with consultants, evaluation of patient's response to treatment, examination of patient, obtaining history from patient or surrogate, ordering and performing treatments and interventions, ordering and review of laboratory studies, ordering and review of radiographic studies, pulse oximetry and re-evaluation of  patient's condition.  Initial Impression / Assessment and Plan / ED Course  I have reviewed the triage vital signs and the nursing notes.  Pertinent labs & imaging results that were available during my care of the patient were reviewed by me and considered in my medical decision making (see chart for details).  Clinical Course   Patient not TPA candidate. Neurology is concerned that these symptoms may be related to partial status epilepticus. Given Ativan and loaded with Keppra. CT head without any acute findings. Per RN patient was speaking to her. He is protecting his airway. Will admit to triad hospitalists for further  workup.  Final Clinical Impressions(s) / ED Diagnoses   Final diagnoses:  Seizure ([place])  Altered mental status, unspecified altered mental status type    New Prescriptions New Prescriptions   No medications on file     Julianne Rice, MD 12/23/15 1539

## 2015-12-23 NOTE — ED Notes (Signed)
Condom cath applied, awaiting urine specimen.

## 2015-12-23 NOTE — H&P (Signed)
History and Physical    Mike Williams [id number] DOB: 12-19-1932 DOA: 12/23/2015  PCP: Garret Reddish, MD  Patient coming from: SNF  Chief Complaint: AMS  HPI: Mike Williams is a 82 y.o. male with medical history significant of prior CVA, seizure disorder, HTN, HLD, dementia, is being brought in from SNF with decreased responsiveness and lethargy. He cannot contribute to the story on my evaluation, unable to wake up, mumbles when called. He was brought in as a code stroke, was evaluated by Dr. Leonel Ramsay, felt not to be likely acute CVA but possible seizure. TRH asked for admission    Review of Systems: As per HPI otherwise 10 point review of systems negative.   Past Medical History:  Diagnosis Date  . Aortic insufficiency 07/04/2011   2/13 Study Conclusions  - Left ventricle: Systolic function was normal. The estimated ejection fraction was in the range of 55% to 60%. - Aortic valve: Moderate regurgitation. - Aorta: The aorta was moderately dilated. Transthoracic echocardiography. M-mode, complete 2D, spectral Doppler, and color Doppler. Height: Height: 170.2cm. Height: 67in. Weight: Weight: 84.1kg. Weight: 185lb. Body mass index: BMI: 29kg/m^2. Body surface area: BSA: 2.33m^2. Blood pressure: 175/86. Patient status: Inpatient. Location: Bedside.      . Aortic root dilatation (HCC)    Noted on 09/2010 echo, with mild-mod AI  . CEREBROVASCULAR ACCIDENT, HX OF 02/09/2007  . DEMENTIA 05/12/2007  . Hemorrhoids   . HYPERGLYCEMIA 09/21/2009  . HYPERTENSION 12/29/2006  . Lactose intolerance   . OBSTRUCTIVE SLEEP APNEA 12/29/2006  . PROSTATE CANCER, HX OF 12/29/2006  . Prostatitis   . Risk for falls   . Seizures ([place])     Past Surgical History:  Procedure Laterality Date  . CHOLECYSTECTOMY    . GANGLION CYST EXCISION    . PROSTATE SURGERY     cryotherapy    Unable to obtain social and family history due to AMS  No Known Allergies  Family History  Problem Relation Age of Onset  . Heart  attack Father   . Heart disease Father   . Cancer Sister     Prior to Admission medications   Medication Sig Start Date End Date Taking? Authorizing Provider  BENICAR 20 MG tablet take 1 tablet by mouth once daily 04/14/15   Marin Olp, MD  clopidogrel (PLAVIX) 75 MG tablet take 1 tablet by mouth once daily 11/28/14   Marin Olp, MD  darifenacin (ENABLEX) 7.5 MG 24 hr tablet Take 1 tablet (7.5 mg total) by mouth daily. 06/29/14   Marin Olp, MD  Dextromethorphan-Quinidine (NUEDEXTA) 20-10 MG CAPS Take 1 capsule by mouth 2 (two) times daily.    Historical Provider, MD  escitalopram (LEXAPRO) 5 MG tablet Take 5 mg by mouth daily. For depression    Historical Provider, MD  felodipine (PLENDIL) 10 MG 24 hr tablet Take 10 mg by mouth daily.    Historical Provider, MD  hydrALAZINE (APRESOLINE) 50 MG tablet take 1 tablet by mouth three times a day 01/30/15   Marin Olp, MD  levETIRAcetam (KEPPRA) 250 MG tablet Take 1 tablet (250 mg total) by mouth 2 (two) times daily. 06/21/15   Marin Olp, MD    Physical Exam: Vitals:   12/23/15 1340 12/23/15 1345 12/23/15 1350 12/23/15 1400  BP: 126/72  143/77   Pulse: 60 63 65 60  Resp: 14 16 17 15   Temp:      TempSrc:      SpO2: 94% 98% 97% 96%  Weight:  Height:          Constitutional: sleeping, not waking up when called. Protecting his airway Vitals:   12/23/15 1340 12/23/15 1345 12/23/15 1350 12/23/15 1400  BP: 126/72  143/77   Pulse: 60 63 65 60  Resp: 14 16 17 15   Temp:      TempSrc:      SpO2: 94% 98% 97% 96%  Weight:      Height:       ENMT: Mucous membranes are moist. Neck: normal, supple Respiratory: clear to auscultation bilaterally, no wheezing, no crackles. Normal respiratory effort. No accessory muscle use.  Cardiovascular: Regular rate and rhythm, no murmurs / rubs / gallops. No extremity edema. 2+ pedal pulses. Abdomen: no tenderness, Bowel sounds positive.  Musculoskeletal: no clubbing /  cyanosis.  Skin: no rashes, lesions, ulcers. No induration Neurologic: not following commands Psychiatric: unresponsive   Labs on Admission: I have personally reviewed following labs and imaging studies  CBC:  Recent Labs Lab 12/23/15 1320 12/23/15 1325  WBC 17.4*  --   NEUTROABS 13.5*  --   HGB 12.8* 13.6  HCT 39.9 40.0  MCV 88.5  --   PLT 180  --    Basic Metabolic Panel:  Recent Labs Lab 12/23/15 1320 12/23/15 1325  NA 141 143  K 3.6 4.4  CL 107 107  CO2 25  --   GLUCOSE 139* 134*  BUN 16 22*  CREATININE 1.33* 1.40*  CALCIUM 8.8*  --    GFR: Estimated Creatinine Clearance: 39.3 mL/min (by C-G formula based on SCr of 1.4 mg/dL). Liver Function Tests:  Recent Labs Lab 12/23/15 1320  AST 18  ALT 14*  ALKPHOS 83  BILITOT 0.8  PROT 7.1  ALBUMIN 3.2*   No results for input(s): LIPASE, AMYLASE in the last 168 hours. No results for input(s): AMMONIA in the last 168 hours. Coagulation Profile:  Recent Labs Lab 12/23/15 1320  INR 1.16   Cardiac Enzymes: No results for input(s): CKTOTAL, CKMB, CKMBINDEX, TROPONINI in the last 168 hours. BNP (last 3 results) No results for input(s): PROBNP in the last 8760 hours. HbA1C: No results for input(s): HGBA1C in the last 72 hours. CBG:  Recent Labs Lab 12/23/15 1314  GLUCAP 143*   Lipid Profile: No results for input(s): CHOL, HDL, LDLCALC, TRIG, CHOLHDL, LDLDIRECT in the last 72 hours. Thyroid Function Tests: No results for input(s): TSH, T4TOTAL, FREET4, T3FREE, THYROIDAB in the last 72 hours. Anemia Panel: No results for input(s): VITAMINB12, FOLATE, FERRITIN, TIBC, IRON, RETICCTPCT in the last 72 hours. Urine analysis:    Component Value Date/Time   COLORURINE YELLOW 09/23/2014 1245   APPEARANCEUR CLOUDY (A) 09/23/2014 1245   LABSPEC 1.021 09/23/2014 1245   PHURINE 7.0 09/23/2014 1245   GLUCOSEU NEGATIVE 09/23/2014 1245   HGBUR NEGATIVE 09/23/2014 1245   BILIRUBINUR NEGATIVE 09/23/2014 1245    BILIRUBINUR n 01/08/2012 1122   KETONESUR NEGATIVE 09/23/2014 1245   PROTEINUR NEGATIVE 09/23/2014 1245   UROBILINOGEN 1.0 09/23/2014 1245   NITRITE POSITIVE (A) 09/23/2014 1245   LEUKOCYTESUR LARGE (A) 09/23/2014 1245   Sepsis Labs: @LABRCNTIP (procalcitonin:4,lacticidven:4) )No results found for this or any previous visit (from the past 240 hour(s)).   Radiological Exams on Admission: Ct Head Code Stroke W/o Cm  Result Date: 12/23/2015 CLINICAL DATA:  Code stroke.  Patient unresponsive and nonverbal. EXAM: CT HEAD WITHOUT CONTRAST TECHNIQUE: Contiguous axial images were obtained from the base of the skull through the vertex without intravenous contrast. COMPARISON:  09/23/2014 CT.  Most recent MR 07/02/2011. FINDINGS: No evidence for acute cortical infarct, acute hemorrhage, mass lesion, or extra-axial fluid. Global atrophy with hydrocephalus ex vacuo. Chronic microvascular ischemic change of an extensive nature. Scattered areas of lacunar infarction. No proximal large vessel hyperdensity to suggest emergent large vessel occlusion. Vascular calcification. Calvarium intact. No sinus or mastoid disease. Compared with priors, similar appearance. ASPECTS [facility] Stroke Program Early CT Score) - Ganglionic level infarction (caudate, lentiform nuclei, internal capsule, insula, M1-M3 cortex): 7 - Supraganglionic infarction (M4-M6 cortex): 3 Total score (0-10 with 10 being normal): 10 IMPRESSION: 1. Global atrophy with extensive chronic microvascular ischemic change. No acute intracranial findings.  Grossly stable from priors. 2. ASPECTS is 10. These results were called by telephone at the time of interpretation on 12/23/2015 at 1:47 pm to Dr. Julianne Rice , who verbally acknowledged these results. Electronically Signed   By: Staci Righter [name]   On: 12/23/2015 13:49    EKG: Independently reviewed. Sinus rhythm  Assessment/Plan Active Problems:   Alzheimer's dementia   Essential hypertension    PROSTATE CANCER, HX OF   History of CVA (cerebrovascular accident)   Seizures (HCC)   PBA (pseudobulbar affect)   Seizure ([place])   AKI (acute kidney injury) ([place])    Acute encephalopathy - neurology consulted, appreciate input, seizure appears more likely, started on Ativan and Keppra  - monitor in SDU, appears to be protecting his airway, satting mid 90s on room air - UA pending to evaluate for infection as WBC is increased to 17 K - discussed with Dr. Leonel Ramsay from neurology, hold off MRI unless patient is not recovering, obtain EEG  Prior CVA - Aspirin PR, resume po Plavix when able   Seizure disorder - obtain EEG  AKI - ? Dehydration as unsure about po intake, provide IVF and reassess  HTN - hold home medications as unsafe to have po - hydralazine prn  DVT prophylaxis: heparin  Code Status: Presumed Full, unable to reach patient's wife  Family Communication: called wife [name], no answer Disposition Plan: admit to SDU Consults called: neurology  Admission status: inpatient    Marzetta Board, MD [facility] Pager [phone]  If 7PM-7AM, please contact night-coverage [email or web address] Password [facility]  12/23/2015, 2:38 PM

## 2015-12-23 NOTE — ED Triage Notes (Signed)
Pt arrives from [facility] on [place] via [place] withdrawing from pain.  EMS reports staff at facility state pt sen in wheelchair with normal activity at 1100.  Pt withdrawing from pain on arrival, resp e/u.

## 2015-12-23 NOTE — Consult Note (Signed)
Neurology Consultation Reason for Consult: Altered Mental status Referring Physician: Lita Mains, D  CC: Altered  History is obtained from:EMS  HPI: Mike Williams is a 82 y.o. male with a history of seizures and dementia who presents with AMS. He was last seen well at 11am and then it was noted that he was not going up and down the halls in his wheelchair like he normally does. He was then checked on, and found to be unresponsive. EMS states that initially he appeared to have a left hemiparesis and he was brought in via ambulance. On arrival, he was moving bilaterally but appeared to have clonic facial movements  LKW: 11am tpa given?: no, seizure at onset, stroke not suspected.   ROS: Unable to obtain due to altered mental status.  Past Medical History:  Diagnosis Date  . Aortic insufficiency 07/04/2011   2/13 Study Conclusions  - Left ventricle: Systolic function was normal. The estimated ejection fraction was in the range of 55% to 60%. - Aortic valve: Moderate regurgitation. - Aorta: The aorta was moderately dilated. Transthoracic echocardiography. M-mode, complete 2D, spectral Doppler, and color Doppler. Height: Height: 170.2cm. Height: 67in. Weight: Weight: 84.1kg. Weight: 185lb. Body mass index: BMI: 29kg/m^2. Body surface area: BSA: 2.68m^2. Blood pressure: 175/86. Patient status: Inpatient. Location: Bedside.      . Aortic root dilatation (HCC)    Noted on 09/2010 echo, with mild-mod AI  . CEREBROVASCULAR ACCIDENT, HX OF 02/09/2007  . DEMENTIA 05/12/2007  . Hemorrhoids   . HYPERGLYCEMIA 09/21/2009  . HYPERTENSION 12/29/2006  . Lactose intolerance   . OBSTRUCTIVE SLEEP APNEA 12/29/2006  . PROSTATE CANCER, HX OF 12/29/2006  . Prostatitis   . Risk for falls   . Seizures ([place])      Family History  Problem Relation Age of Onset  . Heart attack Father   . Heart disease Father   . Cancer Sister      Social History:  reports that he has never smoked. He has never used smokeless tobacco.  He reports that he does not drink alcohol or use drugs.   Exam: Current vital signs: BP 143/77   Pulse 60   Temp 98 F (36.7 C) (Rectal)   Resp 15   Ht 5\' 7"  (1.702 m)   Wt 66.9 kg (147 lb 7.8 oz)   SpO2 96%   BMI 23.10 kg/m  Vital signs in last 24 hours: Temp:  [98 F (36.7 C)] 98 F (36.7 C) (08/20 1311) Pulse Rate:  [59-114] 60 (08/20 1400) Resp:  [14-17] 15 (08/20 1400) BP: (121-143)/(61-77) 143/77 (08/20 1350) SpO2:  [94 %-100 %] 96 % (08/20 1400) Weight:  [66.9 kg (147 lb 7.8 oz)] 66.9 kg (147 lb 7.8 oz) (08/20 1335)   Physical Exam  Constitutional: Appears elderly Psych: Does not interact Eyes: No scleral injection HENT: No OP obstrucion Head: Normocephalic.  Cardiovascular: Normal rate and regular rhythm.  Respiratory: Effort normal and breath sounds normal to anterior ascultation GI: Soft.  No distension. There is no tenderness.  Skin: WDI  Neuro: Mental Status: Patient is obtunded, he keeps his eyes tightly closed when attending to open them. When finally I do open them, he does appear to fixate me. He does follow commands to wiggle his right thumb Cranial Nerves: II: Unable to assess due to tight eye closure Pupils are equal, round, and reactive to light.   III,IV, VI: Eyes are disconjugate V: Facial sensation is symmetric to temperature VII: Facial movement appears grossly symmetric, possible mild flattening  of the right nasal labial fold but this is uncertain VIII: hearing is intact to voice X: Uvula elevates symmetrically XI: Shoulder shrug is symmetric. XII: tongue is midline without atrophy or fasciculations.  Motor: Tone is normal. Bulk is normal. He moves bilateral arms and legs voluntarily with apparent good strength Sensory: Response to noxious stimulation bilaterally Cerebellar: Does not perform     I have reviewed labs in epic and the results pertinent to this consultation are: Chem 8-unremarkable  I have reviewed the images  obtained: CT head-no acute findings  Impression: 82 year old male presenting with altered mental status and left facial clonic activity. I do suspect this represented seizure activity. He is following commands at this time. I would favor treating conservatively with Ativan and Keppra and observing.  Recommendations: 1) with Keppra 1 g followed by 500 mg twice a day 2) Ativan 1 mg 1 3) EEG 4) neurology will continue to follow   Roland Rack, MD [facility] [phone]  If 7pm- 7am, please page neurology on call as listed in [place].

## 2015-12-23 NOTE — ED Notes (Signed)
Drs. Lita Mains and Leonel Ramsay MDs at bedside.

## 2015-12-24 ENCOUNTER — Inpatient Hospital Stay (HOSPITAL_COMMUNITY): Payer: Medicare Other

## 2015-12-24 DIAGNOSIS — Z8546 Personal history of malignant neoplasm of prostate: Secondary | ICD-10-CM

## 2015-12-24 DIAGNOSIS — N3000 Acute cystitis without hematuria: Secondary | ICD-10-CM

## 2015-12-24 DIAGNOSIS — G309 Alzheimer's disease, unspecified: Secondary | ICD-10-CM

## 2015-12-24 DIAGNOSIS — F0280 Dementia in other diseases classified elsewhere without behavioral disturbance: Secondary | ICD-10-CM

## 2015-12-24 LAB — CBC
HCT: 34.6 % — ABNORMAL LOW (ref 39.0–52.0)
HEMOGLOBIN: 11.7 g/dL — AB (ref 13.0–17.0)
MCH: 28.7 pg (ref 26.0–34.0)
MCHC: 33.8 g/dL (ref 30.0–36.0)
MCV: 85 fL (ref 78.0–100.0)
PLATELETS: 156 10*3/uL (ref 150–400)
RBC: 4.07 MIL/uL — ABNORMAL LOW (ref 4.22–5.81)
RDW: 14.2 % (ref 11.5–15.5)
WBC: 7.9 10*3/uL (ref 4.0–10.5)

## 2015-12-24 LAB — COMPREHENSIVE METABOLIC PANEL
ALBUMIN: 2.8 g/dL — AB (ref 3.5–5.0)
ALK PHOS: 68 U/L (ref 38–126)
ALT: 13 U/L — AB (ref 17–63)
ANION GAP: 7 (ref 5–15)
AST: 17 U/L (ref 15–41)
BILIRUBIN TOTAL: 0.5 mg/dL (ref 0.3–1.2)
BUN: 15 mg/dL (ref 6–20)
CALCIUM: 8.7 mg/dL — AB (ref 8.9–10.3)
CO2: 27 mmol/L (ref 22–32)
CREATININE: 1.03 mg/dL (ref 0.61–1.24)
Chloride: 109 mmol/L (ref 101–111)
GFR calc Af Amer: >60 mL/min (ref >60)
GFR calc non Af Amer: >60 mL/min (ref >60)
GLUCOSE: 77 mg/dL (ref 65–99)
Potassium: 3.4 mmol/L — ABNORMAL LOW (ref 3.5–5.1)
Sodium: 143 mmol/L (ref 135–145)
TOTAL PROTEIN: 6 g/dL — AB (ref 6.5–8.1)

## 2015-12-24 LAB — LIPID PANEL
CHOLESTEROL: 104 mg/dL (ref 0–200)
HDL: 46 mg/dL (ref >40)
LDL Cholesterol: 50 mg/dL (ref 0–99)
TRIGLYCERIDES: 42 mg/dL (ref <150)
Total CHOL/HDL Ratio: 2.3 RATIO
VLDL: 8 mg/dL (ref 0–40)

## 2015-12-24 MED ORDER — POTASSIUM CHLORIDE 10 MEQ/100ML IV SOLN
10.0000 meq | INTRAVENOUS | Status: AC
  Administered 2015-12-24 (×3): 10 meq via INTRAVENOUS
  Filled 2015-12-24 (×3): qty 100

## 2015-12-24 NOTE — NC FL2 (Signed)
  [place] LEVEL OF CARE SCREENING TOOL     IDENTIFICATION  Patient Name: Labon Font Birthdate: 09/08/1932 Sex: male Admission Date (Current Location): 12/23/2015  [facility] and [place] Number:  Herbalist and Address:  The . [facility], [place] [address] [place], [place] [address]      Provider Number: [id number]  Attending Physician Name and Address:  Verlee Monte, MD  Relative Name and Phone Number:       Current Level of Care: Hospital Recommended Level of Care: [place] Prior Approval Number:    Date Approved/Denied:   PASRR Number:    Discharge Plan: SNF    Current Diagnoses: Patient Active Problem List   Diagnosis Date Noted  . Seizure ([place]) 12/23/2015  . AKI (acute kidney injury) ([place]) 12/23/2015  . Depression 12/01/2015  . PBA (pseudobulbar affect) 08/08/2015  . Overactive bladder 06/29/2014  . Seizures ([place]) 02/01/2013  . Aortic insufficiency 07/04/2011  . Alzheimer's dementia 05/12/2007  . History of CVA (cerebrovascular accident) 02/09/2007  . Obstructive sleep apnea 12/29/2006  . Essential hypertension 12/29/2006  . PROSTATE CANCER, HX OF 12/29/2006    Orientation RESPIRATION BLADDER Height & Weight     Self  Normal Incontinent, External catheter Weight: 69.8 kg (153 lb 12.8 oz) Height:  5\' 7"  (170.2 cm)  BEHAVIORAL SYMPTOMS/MOOD NEUROLOGICAL BOWEL NUTRITION STATUS      Incontinent Diet (see dc summary)  AMBULATORY STATUS COMMUNICATION OF NEEDS Skin   Extensive Assist Verbally Normal                       Personal Care Assistance Level of Assistance  Bathing, Dressing Bathing Assistance: Maximum assistance   Dressing Assistance: Maximum assistance     Functional Limitations Info             SPECIAL CARE FACTORS FREQUENCY                       Contractures      Additional Factors Info  Code Status, Allergies Code Status Info: DNR Allergies Info: NKA          Current Medications (12/24/2015):  This is the current hospital active medication list Current Facility-Administered Medications  Medication Dose Route Frequency Provider Last Rate Last Dose  . 0.9 %  sodium chloride infusion   Intravenous Continuous Caren Griffins, MD 75 mL/hr at 12/23/15 2138    . 0.9 %  sodium chloride infusion   Intravenous Continuous Caren Griffins, MD 75 mL/hr at 12/23/15 1926 75 mL/hr at 12/23/15 1926  . aspirin suppository 300 mg  300 mg Rectal Daily Costin Karlyne Greenspan, MD   300 mg at 12/23/15 1749  . cefTRIAXone (ROCEPHIN) 1 g in dextrose 5 % 50 mL IVPB  1 g Intravenous Q24H Caren Griffins, MD   1 g at 12/23/15 1927  . heparin injection 5,000 Units  5,000 Units Subcutaneous Q8H Caren Griffins, MD   5,000 Units at 12/24/15 0507  . hydrALAZINE (APRESOLINE) injection 5 mg  5 mg Intravenous Q6H PRN Caren Griffins, MD   5 mg at 12/23/15 2137     Discharge Medications: Please see discharge summary for a list of discharge medications.  Relevant Imaging Results:  Relevant Lab Results:   Additional Information SS#: [id number]  Jorge Ny, LCSW

## 2015-12-24 NOTE — Evaluation (Signed)
Occupational Therapy Evaluation Patient Details Name: Mike Williams MRN: [id number] DOB: [birth year] Today's Date: 12/24/2015    History of Present Illness 82 year old male presenting with altered mental status and left facial clonic activity.  Hx CVA, advanced dementia with move to SNF in March 2017.Marland Kitchen      Clinical Impression   Pt admitted with above. He demonstrates the below listed deficits and will benefit from continued OT to maximize safety and independence with BADLs.  Pt presents to OT with generalized weakness, cognitive deficits, and impaired balance.  He requires max A for ADLs.  Recommend return to SNF at discharge.      Follow Up Recommendations  SNF    Equipment Recommendations  None recommended by OT    Recommendations for Other Services       Precautions / Restrictions Precautions Precautions: Fall Restrictions Weight Bearing Restrictions: No      Mobility Bed Mobility Overal bed mobility: Needs Assistance;+2 for physical assistance Bed Mobility: Supine to Sit     Supine to sit: Mod assist;+2 for physical assistance     General bed mobility comments: Pt soaked in urine as condom cath off on arrival.  Needed asssist for LEs and elevation of trunk.  Transfers Overall transfer level: Needs assistance Equipment used: 2 person hand held assist Transfers: Sit to/from [facility] Sit to Stand: Min assist;+2 physical assistance Stand pivot transfers: +2 physical assistance;Mod assist       General transfer comment: Pt with slight posterior lean in standing but only min assist to stand with +2 HHA.  Able to take pivotal steps to chair not quite getting far enough around to be centered needing mod assist for descent. Pt moves slightly better than anticipated.  Wife came in and confirmed pt uses wheelchair in NH and that pt will return to NH at d/c.     Balance Overall balance assessment: Needs assistance Sitting-balance support: No upper  extremity supported Sitting balance-Leahy Scale: Fair Sitting balance - Comments: Can sit EOB but needed min guard assist for safety due to restless at EOB. Pt was wet so unsure if he was trying to get out of wet linens.  PT and OT cleaned pt and changed all linen as well as bathed pt.  NT reapplied the condom cath.                                     ADL Overall ADL's : Needs assistance/impaired Eating/Feeding: Independent;Moderate assistance;Bed level   Grooming: Wash/dry hands;Wash/dry face;Oral care;Brushing hair;Sitting;Moderate assistance   Upper Body Bathing: Sitting;Maximal assistance   Lower Body Bathing: Sit to/from stand;Total assistance   Upper Body Dressing : Sitting;Total assistance   Lower Body Dressing: Total assistance;Sit to/from stand   Toilet Transfer: Moderate assistance;+2 for physical assistance;Stand-pivot;BSC;RW   Toileting- Clothing Manipulation and Hygiene: Total assistance;Sit to/from stand       Functional mobility during ADLs: Moderate assistance;+2 for physical assistance;+2 for safety/equipment;Rolling walker       Vision     Perception     Praxis      Pertinent Vitals/Pain Pain Assessment: No/denies pain Faces Pain Scale: No hurt     Hand Dominance Right   Extremity/Trunk Assessment Upper Extremity Assessment Upper Extremity Assessment: Generalized weakness   Lower Extremity Assessment Lower Extremity Assessment: Defer to PT evaluation   Cervical / Trunk Assessment Cervical / Trunk Assessment: Kyphotic   Communication Communication Communication: No difficulties  Cognition Arousal/Alertness: Awake/alert Behavior During Therapy: Flat affect Overall Cognitive Status: Impaired/Different from baseline Area of Impairment: Following commands;Safety/judgement;Problem solving       Following Commands: Follows one step commands consistently Safety/Judgement: Decreased awareness of safety   Problem Solving: Slow  processing;Difficulty sequencing;Requires verbal cues     General Comments       Exercises       Shoulder Instructions      Home Living Family/patient expects to be discharged to:: Skilled nursing facility     Type of Home: [place]                                  Prior Functioning/Environment Level of Independence: Needs assistance  Gait / Transfers Assistance Needed: used wheelchair in NH with min assist for transfer to wheelchair. ADL's / Homemaking Assistance Needed: total assist to mod assist   Comments: wife came in and states that pt will return to [place]    OT Diagnosis: Generalized weakness;Cognitive deficits;Acute pain   OT Problem List: Decreased strength;Decreased activity tolerance;Impaired balance (sitting and/or standing);Decreased safety awareness;Decreased knowledge of use of DME or AE;Pain   OT Treatment/Interventions: Self-care/ADL training;DME and/or AE instruction;Therapeutic activities;Patient/family education;Balance training    OT Goals(Current goals can be found in the care plan section) Acute Rehab OT Goals Patient Stated Goal: to go back to NH OT Goal Formulation: With patient Time For Goal Achievement: 01/07/16 Potential to Achieve Goals: Good ADL Goals Pt Will Perform Grooming: sitting;with min assist Pt Will Transfer to Toilet: with min assist;stand pivot transfer;bedside commode Pt Will Perform Toileting - Clothing Manipulation and hygiene: with max assist;sit to/from stand  OT Frequency: Min 2X/week   Barriers to D/C: Decreased caregiver support          Co-evaluation PT/OT/SLP Co-Evaluation/Treatment: Yes Reason for Co-Treatment: For patient/therapist safety PT goals addressed during session: Mobility/safety with mobility OT goals addressed during session: ADL's and self-care      End of Session Equipment Utilized During Treatment: Rolling walker;Gait belt Nurse Communication: Mobility  status;Weight bearing status  Activity Tolerance: Patient tolerated treatment well Patient left: in chair;with call bell/phone within reach;with chair alarm set   Time: [id number] OT Time Calculation (min): 19 min Charges:  OT General Charges $OT Visit: 1 Procedure OT Evaluation $OT Eval Moderate Complexity: 1 Procedure G-Codes:    Mike Williams January 04, 2016, 5:42 PM

## 2015-12-24 NOTE — Progress Notes (Signed)
EEG completed, results pending. 

## 2015-12-24 NOTE — Evaluation (Signed)
Speech Language Pathology Evaluation Patient Details Name: Cyrie Gamarra MRN: [id number] DOB: 07-Oct-1932 Today's Date: 12/24/2015 Time: [id number] SLP Time Calculation (min) (ACUTE ONLY): 9 min  Problem List:  Patient Active Problem List   Diagnosis Date Noted  . Seizure ([place]) 12/23/2015  . AKI (acute kidney injury) ([place]) 12/23/2015  . Depression 12/01/2015  . PBA (pseudobulbar affect) 08/08/2015  . UTI (urinary tract infection) 09/23/2014  . Overactive bladder 06/29/2014  . Seizures ([place]) 02/01/2013  . Aortic insufficiency 07/04/2011  . Alzheimer's dementia 05/12/2007  . History of CVA (cerebrovascular accident) 02/09/2007  . Obstructive sleep apnea 12/29/2006  . Essential hypertension 12/29/2006  . PROSTATE CANCER, HX OF 12/29/2006   Past Medical History:  Past Medical History:  Diagnosis Date  . Aortic insufficiency 07/04/2011   2/13 Study Conclusions  - Left ventricle: Systolic function was normal. The estimated ejection fraction was in the range of 55% to 60%. - Aortic valve: Moderate regurgitation. - Aorta: The aorta was moderately dilated. Transthoracic echocardiography. M-mode, complete 2D, spectral Doppler, and color Doppler. Height: Height: 170.2cm. Height: 67in. Weight: Weight: 84.1kg. Weight: 185lb. Body mass index: BMI: 29kg/m^2. Body surface area: BSA: 2.36m^2. Blood pressure: 175/86. Patient status: Inpatient. Location: Bedside.      . Aortic root dilatation (HCC)    Noted on 09/2010 echo, with mild-mod AI  . CEREBROVASCULAR ACCIDENT, HX OF 02/09/2007  . DEMENTIA 05/12/2007  . Hemorrhoids   . HYPERGLYCEMIA 09/21/2009  . HYPERTENSION 12/29/2006  . Lactose intolerance   . OBSTRUCTIVE SLEEP APNEA 12/29/2006  . PROSTATE CANCER, HX OF 12/29/2006  . Prostatitis   . Risk for falls   . Seizures ([place])    Past Surgical History:  Past Surgical History:  Procedure Laterality Date  . CHOLECYSTECTOMY    . GANGLION CYST EXCISION    . PROSTATE SURGERY     cryotherapy   HPI:   82 year old male presenting with altered mental status and left facial clonic activity.  Hx CVA, advanced dementia with move to SNF in March 2017.Marland Kitchen     Assessment / Plan / Recommendation Clinical Impression  Pt with baseline advanced dementia.  Improving alertness today, likely approaching baseline, with returning ability to follow intermittent one-step commands; states name and name of his wife; identifies number of children. Able to verbalize  automatic sequences (days of week, months of year).  Poor sustained attention and limited spontaneity of verbal output.  No acute SLP needs identified with regard to communication - our services will sign off.     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[place])   Cognition  Overall Cognitive Status: Impaired/Different from baseline Arousal/Alertness: Awake/alert Orientation Level: Oriented to person;Disoriented to place;Disoriented to time;Disoriented to situation Attention: Sustained Sustained Attention: Impaired Sustained Attention Impairment: Verbal basic;Functional basic Memory: Impaired Memory Impairment: Storage deficit;Retrieval deficit Awareness: Impaired Awareness Impairment: Intellectual impairment Problem Solving: Impaired Problem Solving Impairment: Verbal basic    Comprehension  Auditory Comprehension Overall Auditory Comprehension: Impaired Reading Comprehension Reading Status: Not tested    Expression Expression Primary Mode of Expression: Verbal Verbal Expression Overall Verbal Expression: Impaired   Oral / Motor  Oral Motor/Sensory Function Overall Oral Motor/Sensory  Function: Other (comment) (  left facial clonic activity) Motor Speech Articulation: Within functional limitis   GO                    [name] 12/24/2015, 2:12 PM

## 2015-12-24 NOTE — Progress Notes (Signed)
PROGRESS NOTE  Mike Williams  [id number] DOB: January 13, 1933 DOA: 12/23/2015 PCP: Garret Reddish, MD Outpatient Specialists:  Subjective: Appears to be more awake and alert today he will state his name.  Brief Narrative:  Mike Williams is a 82 y.o. male with medical history significant of prior CVA, seizure disorder, HTN, HLD, dementia, is being brought in from SNF with decreased responsiveness and lethargy. He cannot contribute to the story on my evaluation, unable to wake up, mumbles when called. He was brought in as a code stroke, was evaluated by Dr. Leonel Ramsay, felt not to be likely acute CVA but possible seizure. TRH asked for admission   Assessment & Plan:   Principal Problem:   UTI (urinary tract infection) Active Problems:   Alzheimer's dementia   Essential hypertension   PROSTATE CANCER, HX OF   History of CVA (cerebrovascular accident)   Seizures (HCC)   PBA (pseudobulbar affect)   Seizure ([place])   AKI (acute kidney injury) ([place])   UTI -The urinalysis was not available at the time of admission but is very consistent with UTI with TNTC WBCs. -Send urine cultures, started on Rocephin and adjust antibiotics according to culture results.  Acute metabolic encephalopathy - neurology consulted, appreciate input, seizure appears more likely, started on Ativan and Keppra  - monitor in SDU, appears to be protecting his airway, satting mid 90s on room air - This is could be secondary to the UTI, there was concern about seizure EEG pending.   Prior CVA - Aspirin PR, resume po Plavix when able   Seizure disorder - obtain EEG  AKI -Dehydration secondary to poor by mouth intake and UTI. -Creatinine 1.33 admission, improved to 1.0 with IV fluids hydration, continue IVF and check BMP in a.m.  HTN - hold home medications as unsafe to have po - hydralazine prn  DVT prophylaxis: Lovenox Code Status: Partial Code Family Communication:  Disposition Plan:  Diet: Diet NPO time  specified  Consultants:   Neurology  Procedures:   None  Antimicrobials:   Rocephin started  Objective: Vitals:   12/24/15 0332 12/24/15 0800 12/24/15 0917 12/24/15 1034  BP: (!) 159/76 (!) 168/93 (!) 191/70 (!) 173/77  Pulse: 61 (!) 56  66  Resp: 11 14  15   Temp: 97.8 F (36.6 C) 97.5 F (36.4 C)  97.5 F (36.4 C)  TempSrc: Oral   Oral  SpO2: 98% 99%  98%  Weight:      Height:        Intake/Output Summary (Last 24 hours) at 12/24/15 1103 Last data filed at 12/24/15 0335  Gross per 24 hour  Intake            617.5 ml  Output              450 ml  Net            167.5 ml   Filed Weights   12/23/15 1335 12/23/15 1714  Weight: 66.9 kg (147 lb 7.8 oz) 69.8 kg (153 lb 12.8 oz)    Examination: General exam: Appears calm and comfortable  Respiratory system: Clear to auscultation. Respiratory effort normal. Cardiovascular system: S1 & S2 heard, RRR. No JVD, murmurs, rubs, gallops or clicks. No pedal edema. Gastrointestinal system: Abdomen is nondistended, soft and nontender. No organomegaly or masses felt. Normal bowel sounds heard. Central nervous system: Alert and oriented. No focal neurological deficits. Extremities: Symmetric 5 x 5 power. Skin: No rashes, lesions or ulcers Psychiatry: Judgement and insight appear normal. Mood &  affect appropriate.   Data Reviewed: I have personally reviewed following labs and imaging studies  CBC:  Recent Labs Lab 12/23/15 1320 12/23/15 1325 12/24/15 0353  WBC 17.4*  --  7.9  NEUTROABS 13.5*  --   --   HGB 12.8* 13.6 11.7*  HCT 39.9 40.0 34.6*  MCV 88.5  --  85.0  PLT 180  --  [id number]   Basic Metabolic Panel:  Recent Labs Lab 12/23/15 1320 12/23/15 1325 12/24/15 0353  NA 141 143 143  K 3.6 4.4 3.4*  CL 107 107 109  CO2 25  --  27  GLUCOSE 139* 134* 77  BUN 16 22* 15  CREATININE 1.33* 1.40* 1.03  CALCIUM 8.8*  --  8.7*   GFR: Estimated Creatinine Clearance: 53.5 mL/min (by C-G formula based on SCr of 1.03  mg/dL). Liver Function Tests:  Recent Labs Lab 12/23/15 1320 12/24/15 0353  AST 18 17  ALT 14* 13*  ALKPHOS 83 68  BILITOT 0.8 0.5  PROT 7.1 6.0*  ALBUMIN 3.2* 2.8*   No results for input(s): LIPASE, AMYLASE in the last 168 hours. No results for input(s): AMMONIA in the last 168 hours. Coagulation Profile:  Recent Labs Lab 12/23/15 1320  INR 1.16   Cardiac Enzymes: No results for input(s): CKTOTAL, CKMB, CKMBINDEX, TROPONINI in the last 168 hours. BNP (last 3 results) No results for input(s): PROBNP in the last 8760 hours. HbA1C: No results for input(s): HGBA1C in the last 72 hours. CBG:  Recent Labs Lab 12/23/15 1314  GLUCAP 143*   Lipid Profile:  Recent Labs  12/24/15 0353  CHOL 104  HDL 46  LDLCALC 50  TRIG 42  CHOLHDL 2.3   Thyroid Function Tests: No results for input(s): TSH, T4TOTAL, FREET4, T3FREE, THYROIDAB in the last 72 hours. Anemia Panel: No results for input(s): VITAMINB12, FOLATE, FERRITIN, TIBC, IRON, RETICCTPCT in the last 72 hours. Urine analysis:    Component Value Date/Time   COLORURINE YELLOW 12/23/2015 1805   APPEARANCEUR CLOUDY (A) 12/23/2015 1805   LABSPEC 1.017 12/23/2015 1805   PHURINE 6.5 12/23/2015 1805   GLUCOSEU NEGATIVE 12/23/2015 1805   HGBUR TRACE (A) 12/23/2015 1805   BILIRUBINUR NEGATIVE 12/23/2015 1805   BILIRUBINUR n 01/08/2012 1122   KETONESUR NEGATIVE 12/23/2015 1805   PROTEINUR 30 (A) 12/23/2015 1805   UROBILINOGEN 1.0 09/23/2014 1245   NITRITE NEGATIVE 12/23/2015 1805   LEUKOCYTESUR LARGE (A) 12/23/2015 1805   Sepsis Labs: @LABRCNTIP (procalcitonin:4,lacticidven:4)  ) Recent Results (from the past 240 hour(s))  MRSA PCR Screening     Status: None   Collection Time: 12/23/15  6:01 PM  Result Value Ref Range Status   MRSA by PCR NEGATIVE NEGATIVE Final    Comment:        The GeneXpert MRSA Assay (FDA approved for NASAL specimens only), is one component of a comprehensive MRSA  colonization surveillance program. It is not intended to diagnose MRSA infection nor to guide or monitor treatment for MRSA infections.      Invalid input(s): PROCALCITONIN, LACTICACIDVEN   Radiology Studies: Ct Head Code Stroke W/o Cm  Result Date: 12/23/2015 CLINICAL DATA:  Code stroke.  Patient unresponsive and nonverbal. EXAM: CT HEAD WITHOUT CONTRAST TECHNIQUE: Contiguous axial images were obtained from the base of the skull through the vertex without intravenous contrast. COMPARISON:  09/23/2014 CT.  Most recent MR 07/02/2011. FINDINGS: No evidence for acute cortical infarct, acute hemorrhage, mass lesion, or extra-axial fluid. Global atrophy with hydrocephalus ex vacuo. Chronic microvascular ischemic change  of an extensive nature. Scattered areas of lacunar infarction. No proximal large vessel hyperdensity to suggest emergent large vessel occlusion. Vascular calcification. Calvarium intact. No sinus or mastoid disease. Compared with priors, similar appearance. ASPECTS [facility] Stroke Program Early CT Score) - Ganglionic level infarction (caudate, lentiform nuclei, internal capsule, insula, M1-M3 cortex): 7 - Supraganglionic infarction (M4-M6 cortex): 3 Total score (0-10 with 10 being normal): 10 IMPRESSION: 1. Global atrophy with extensive chronic microvascular ischemic change. No acute intracranial findings.  Grossly stable from priors. 2. ASPECTS is 10. These results were called by telephone at the time of interpretation on 12/23/2015 at 1:47 pm to Dr. Julianne Rice , who verbally acknowledged these results. Electronically Signed   By: Staci Righter [name]   On: 12/23/2015 13:49        Scheduled Meds: . aspirin  300 mg Rectal Daily  . cefTRIAXone (ROCEPHIN)  IV  1 g Intravenous Q24H  . heparin  5,000 Units Subcutaneous Q8H   Continuous Infusions: . sodium chloride 75 mL/hr at 12/23/15 2138  . sodium chloride 75 mL/hr (12/23/15 1926)     LOS: 1 day    Time spent: 83  minutes    Adena Sima A, MD [facility] Pager [phone]  If 7PM-7AM, please contact night-coverage [email or web address] Password [email or web address] 12/24/2015, 11:03 AM

## 2015-12-24 NOTE — Progress Notes (Signed)
Initial Nutrition Assessment  DOCUMENTATION CODES:   Not applicable  INTERVENTION:  Follow for diet advancement and initiate nutrition supplements at that time if appropriate.   RD will continue to follow.    NUTRITION DIAGNOSIS:   Inadequate oral intake related to inability to eat as evidenced by NPO status.  GOAL:   Patient will meet greater than or equal to 90% of their needs  MONITOR:   Diet advancement, I & O's, Goals of care discussions  REASON FOR ASSESSMENT:   Low Braden    ASSESSMENT:   82 y.o. male with medical history significant of prior CVA, seizure disorder, HTN, HLD, dementia, is being brought in from SNF with decreased responsiveness and lethargy. Appears to have exacerbation of symptoms related to UTI.   Upon assessment patient is unresponsive and unable to answer questions. RN confirms that they he has not been responsive and no family has come. Will remain NPO in setting of lethargy pending workup. No information found in paper chart from SNF regarding diet history. No weight loss apparent in medical chart Patient has remained between 147-156 lbs.  Medications reviewed and include: NS @ 62ml/hr.  Labs reviewed: Potassium 3.4.  Nutrition-Focused physical exam completed. Findings are mild fat depletion, mild muscle depletion, and no edema.   Diet Order:  Diet NPO time specified  Skin:  Reviewed, no issues  Last BM:  12/23/2015  Height:   Ht Readings from Last 1 Encounters:  12/23/15 5\' 7"  (1.702 m)    Weight:   Wt Readings from Last 1 Encounters:  12/23/15 153 lb 12.8 oz (69.8 kg)    Ideal Body Weight:  67.3 kg  BMI:  Body mass index is 24.09 kg/m.  Estimated Nutritional Needs:   Kcal:  1600-1800  Protein:  70-85 grams  Fluid:  >/= 1.6 L/day  EDUCATION NEEDS:   No education needs identified at this time  Willey Blade, MS, RD, LDN

## 2015-12-24 NOTE — Evaluation (Deleted)
Occupational Therapy Evaluation Patient Details Name: Mike Williams MRN: [id number] DOB: [birth year] Today's Date: 12/24/2015    History of Present Illness 82 year old male presenting with altered mental status and left facial clonic activity.  Hx CVA, advanced dementia with move to SNF in March 2017.Marland Kitchen      Clinical Impression   Pt admitted with above. He demonstrates the below listed deficits and will benefit from continued OT to maximize safety and independence with BADLs.  Pt presents to OT with generalized weakness, cognitive deficits, and impaired balance.  He requires max A for ADLs.  Recommend return to SNF at discharge.       Follow Up Recommendations  SNF    Equipment Recommendations  None recommended by OT    Recommendations for Other Services       Precautions / Restrictions Precautions Precautions: Fall Restrictions Weight Bearing Restrictions: No      Mobility Bed Mobility Overal bed mobility: Needs Assistance;+2 for physical assistance Bed Mobility: Supine to Sit     Supine to sit: Mod assist;+2 for physical assistance     General bed mobility comments: Pt soaked in urine as condom cath off on arrival.  Needed asssist for LEs and elevation of trunk.  Transfers Overall transfer level: Needs assistance Equipment used: 2 person hand held assist Transfers: Sit to/from [facility] Sit to Stand: Min assist;+2 physical assistance Stand pivot transfers: +2 physical assistance;Mod assist       General transfer comment: Pt with slight posterior lean in standing but only min assist to stand with +2 HHA.  Able to take pivotal steps to chair not quite getting far enough around to be centered needing mod assist for descent. Pt moves slightly better than anticipated.  Wife came in and confirmed pt uses wheelchair in NH and that pt will return to NH at d/c.     Balance Overall balance assessment: Needs assistance Sitting-balance support: No upper  extremity supported Sitting balance-Leahy Scale: Fair Sitting balance - Comments: Can sit EOB but needed min guard assist for safety due to restless at EOB. Pt was wet so unsure if he was trying to get out of wet linens.  PT and OT cleaned pt and changed all linen as well as bathed pt.  NT reapplied the condom cath.                                     ADL Overall ADL's : Needs assistance/impaired Eating/Feeding: Independent   Grooming: Wash/dry hands;Wash/dry face;Oral care;Brushing hair;Set up;Sitting   Upper Body Bathing: Set up;Sitting   Lower Body Bathing: Maximal assistance;Sit to/from stand   Upper Body Dressing : Minimal assistance;Sitting   Lower Body Dressing: Total assistance;Sit to/from stand   Toilet Transfer: Moderate assistance;+2 for physical assistance;Stand-pivot;BSC;RW   Toileting- Clothing Manipulation and Hygiene: Total assistance;Sit to/from stand       Functional mobility during ADLs: Moderate assistance;+2 for physical assistance;+2 for safety/equipment;Rolling walker       Vision     Perception     Praxis      Pertinent Vitals/Pain Pain Assessment: No/denies pain Faces Pain Scale: No hurt     Hand Dominance Right   Extremity/Trunk Assessment Upper Extremity Assessment Upper Extremity Assessment: Generalized weakness   Lower Extremity Assessment Lower Extremity Assessment: Defer to PT evaluation   Cervical / Trunk Assessment Cervical / Trunk Assessment: Kyphotic   Communication Communication Communication: No difficulties  Cognition Arousal/Alertness: Awake/alert Behavior During Therapy: Flat affect Overall Cognitive Status: Impaired/Different from baseline Area of Impairment: Following commands;Safety/judgement;Problem solving       Following Commands: Follows one step commands consistently Safety/Judgement: Decreased awareness of safety   Problem Solving: Slow processing;Difficulty sequencing;Requires verbal  cues     General Comments       Exercises       Shoulder Instructions      Home Living Family/patient expects to be discharged to:: Skilled nursing facility     Type of Home: [place]                                  Prior Functioning/Environment Level of Independence: Needs assistance  Gait / Transfers Assistance Needed: used wheelchair in NH with min assist for transfer to wheelchair. ADL's / Homemaking Assistance Needed: total assist to mod assist   Comments: wife came in and states that pt will return to [place]    OT Diagnosis: Generalized weakness;Cognitive deficits;Acute pain   OT Problem List: Decreased strength;Decreased activity tolerance;Impaired balance (sitting and/or standing);Decreased safety awareness;Decreased knowledge of use of DME or AE;Pain   OT Treatment/Interventions: Self-care/ADL training;DME and/or AE instruction;Therapeutic activities;Patient/family education;Balance training    OT Goals(Current goals can be found in the care plan section) Acute Rehab OT Goals Patient Stated Goal: to go back to NH OT Goal Formulation: With patient Time For Goal Achievement: 01/07/16 Potential to Achieve Goals: Good ADL Goals Pt Will Perform Grooming: sitting;with min assist Pt Will Transfer to Toilet: with min assist;stand pivot transfer;bedside commode Pt Will Perform Toileting - Clothing Manipulation and hygiene: with max assist;sit to/from stand  OT Frequency: Min 2X/week   Barriers to D/C: Decreased caregiver support          Co-evaluation PT/OT/SLP Co-Evaluation/Treatment: Yes Reason for Co-Treatment: For patient/therapist safety PT goals addressed during session: Mobility/safety with mobility OT goals addressed during session: ADL's and self-care      End of Session Equipment Utilized During Treatment: Rolling walker;Gait belt Nurse Communication: Mobility status;Weight bearing status  Activity Tolerance:  Patient tolerated treatment well Patient left: in chair;with call bell/phone within reach;with chair alarm set   Time: [id number] OT Time Calculation (min): 19 min Charges:  OT General Charges $OT Visit: 1 Procedure OT Evaluation $OT Eval Moderate Complexity: 1 Procedure G-Codes:    Lucille Passy M Jan 18, 2016, 5:38 PM

## 2015-12-24 NOTE — Progress Notes (Signed)
Subjective:  Mike Williams is resting comfortably in bed he is following a few basic commands.  Exam: Vitals:   12/23/15 2345 12/24/15 0332  BP: (!) 157/65 (!) 159/76  Pulse: (!) 56 61  Resp: 13 11  Temp: 98.4 F (36.9 C) 97.8 F (36.6 C)    HEENT-  Normocephalic, no lesions, without obvious abnormality.  Normal external eye and conjunctiva.  Normal TM's bilaterally.  Normal auditory canals and external ears. Normal external nose, mucus membranes and septum.  Normal pharynx. Cardiovascular- regular rate and rhythm, S1, S2 normal, no murmur, click, rub or gallop, pulses palpable throughout   Lungs- chest clear, no wheezing, rales, normal symmetric air entry, Heart exam - S1, S2 normal, no murmur, no gallop, rate regular Abdomen- soft, non-tender; bowel sounds normal; no masses,  no organomegaly Extremities- less then 2 second capillary refill     Gen: In bed, NAD MS: There is a known baseline history of dementia. Lyrica follows a few simple commands. CN: Cranial nerves II through XII are intact. Motor: Antigravity movement of all 4 extremities. Sensory: Grossly intact. DTR: 1-2+ bilaterally.  Pertinent Labs/Diagnostics: Reviewed.    Impression:   Mike Williams is resting comfortably in bed he has a known history of dementia. It appears he had an exacerbation of his symptoms related to a urinary tract infection. There was a concern for possible seizure. EEG is pending. There is no abnormal facial movement this morning.   Recommendations:  1. We will review results of EEG.   Marcelles Clinard A. Tasia Catchings, [name] Neurohospitalist Phone: [phone]   12/24/2015, 8:32 AM

## 2015-12-24 NOTE — Clinical Social Work Note (Signed)
Clinical Social Work Assessment  Patient Details  Name: Mike Williams MRN: [id number] Date of Birth: 19-Nov-1932  Date of referral:  12/24/15               Reason for consult:  Facility Placement                Permission sought to share information with:    Permission granted to share information::  Yes, Verbal Permission Granted  Name::     Mike Williams  Agency::  [facility]  Relationship::  wife  Contact Information:     Housing/Transportation Living arrangements for the past 2 months:  [place] of Information:  Spouse Patient Interpreter Needed:  None Criminal Activity/Legal Involvement Pertinent to Current Situation/Hospitalization:  No - Comment as needed Significant Relationships:  Spouse Lives with:  Spouse Do you feel safe going back to the place where you live?  Yes Need for family participation in patient care:  Yes (Comment) (decision making at this time)  Care giving concerns:  Pt was at [facility] rehab for skilled care- no caregiving concerns reported   Facilities manager / plan:  CSW spoke with pt wife concerning plan for patient at time of DC.  Wife confirms that pt was at [facility] rehab prior to admission.   Employment status:  Retired Forensic scientist:  Medicare PT Recommendations:  Not assessed at this time Information / Referral to community resources:     Patient/Family's Response to care:  Pt wife is agreeable to pt return to [facility] when ready for DC.    Patient/Family's Understanding of and Emotional Response to Diagnosis, Current Treatment, and Prognosis:  No questions or concerns- pt wife anxious for pt to return to rehab.  Emotional Assessment Appearance:  Appears stated age Attitude/Demeanor/Rapport:  Unable to Assess Affect (typically observed):  Unable to Assess Orientation:  Oriented to Self Alcohol / Substance use:  Not Applicable Psych involvement (Current and /or in the community):  No  (Comment)  Discharge Needs  Concerns to be addressed:  Care Coordination Readmission within the last 30 days:  No Current discharge risk:  None Barriers to Discharge:  Continued Medical Work up   Jorge Ny, LCSW 12/24/2015, 3:03 PM

## 2015-12-24 NOTE — Plan of Care (Signed)
Problem: Education: Goal: Knowledge of [facility] Education information/materials will improve Outcome: Progressing Patient and patient family aware of changes in POC. Made family aware that patient mental status has improved throughout my shift and that we do not have any evidence of new stroke or active seizures at this time. Patient has no complaints of pain. Oriented to room and given call light and instructed how to call staff.   Problem: Safety: Goal: Ability to remain free from injury will improve Outcome: Progressing  Patient did not experience any fall on my shift. Bed alarm and chair alarm utilized and patient had call bell within reach.  Problem: Health Behavior/Discharge Planning: Goal: Ability to manage health-related needs will improve Outcome: Progressing POC discussed with patient's wife , who makes decisions. Patient will return to [facility] when appropriate.  Problem: Pain Managment: Goal: General experience of comfort will improve Outcome: Progressing No complaints of pain or signs that patient is experiencing pain. Patient rated on both CPOT and PAINAD scales today, depending on alertness.  Problem: Physical Regulation: Goal: Ability to maintain clinical measurements within normal limits will improve Outcome: Progressing Per family members, patient is close to baseline. Alert and comfortable sitting up in chair. Goal: Will remain free from infection Outcome: Progressing Patient currently being tested for infection of urine. Awaiting results.  Problem: Skin Integrity: Goal: Risk for impaired skin integrity will decrease Outcome: Progressing Patient repositioned at 2 hour intervals. Patient repositions self but favors right side.  Problem: Tissue Perfusion: Goal: Risk factors for ineffective tissue perfusion will decrease Outcome: Progressing Patient receiving heparin subcutaneous for VTE prophylaxis.   Problem: Nutrition: Goal: Adequate nutrition  will be maintained Outcome: Progressing Patient remained NPO during my shift as LOC was improving. Expect patient will be able to resume diet 8/22. Per family patient had good appetite and no problem swallowing at preadmission baseline.

## 2015-12-24 NOTE — Evaluation (Signed)
Physical Therapy Evaluation Patient Details Name: Mike Williams MRN: [id number] DOB: [birth year] Today's Date: 12/24/2015   History of Present Illness  82 year old male presenting with altered mental status and left facial clonic activity.  Hx CVA, advanced dementia with move to SNF in March 2017.Marland Kitchen     Clinical Impression  Pt admitted with above diagnosis. Pt currently with functional limitations due to the deficits listed below (see PT Problem List). Pt was able to stand and pivot to chair with +2 min assist. Will be able to return to [facility] when medically stable. Will follow acutely.  Pt will benefit from skilled PT to increase their independence and safety with mobility to allow discharge to the venue listed below.      Follow Up Recommendations SNF;Supervision/Assistance - 24 hour    Equipment Recommendations  Other (comment) (TBA)    Recommendations for Other Services       Precautions / Restrictions Precautions Precautions: Fall Restrictions Weight Bearing Restrictions: No      Mobility  Bed Mobility Overal bed mobility: Needs Assistance;+2 for physical assistance Bed Mobility: Supine to Sit     Supine to sit: Mod assist;+2 for physical assistance     General bed mobility comments: Pt soaked in urine as condom cath off on arrival.  Needed asssist for LEs and elevation of trunk.  Transfers Overall transfer level: Needs assistance Equipment used: 2 person hand held assist Transfers: Sit to/from [facility] Sit to Stand: Min assist;+2 physical assistance Stand pivot transfers: +2 physical assistance;Mod assist       General transfer comment: Pt with slight posterior lean in standing but only min assist to stand with +2 HHA.  Able to take pivotal steps to chair not quite getting far enough around to be centered needing mod assist for descent. Pt moves slightly better than anticipated.  Wife came in and confirmed pt uses wheelchair in NH and that pt will  return to NH at d/c.   Ambulation/Gait                Stairs            Wheelchair Mobility    Modified Rankin (Stroke Patients Only)       Balance Overall balance assessment: Needs assistance;History of Falls Sitting-balance support: No upper extremity supported;Feet supported Sitting balance-Leahy Scale: Fair Sitting balance - Comments: Can sit EOB but needed min guard assist for safety due to restless at EOB. Pt was wet so unsure if he was trying to get out of wet linens.  PT and OT cleaned pt and changed all linen as well as bathed pt.  NT reapplied the condom cath.                                      Pertinent Vitals/Pain Pain Assessment: No/denies pain Faces Pain Scale: No hurt  VSS    Home Living Family/patient expects to be discharged to:: Skilled nursing facility     Type of Home: [place]                Prior Function Level of Independence: Needs assistance   Gait / Transfers Assistance Needed: used wheelchair in NH with min assist for transfer to wheelchair.  ADL's / Homemaking Assistance Needed: total assist to mod assist  Comments: wife came in and states that pt will return to [facility]  Dominant Hand: Right    Extremity/Trunk Assessment   Upper Extremity Assessment: Defer to OT evaluation           Lower Extremity Assessment: Generalized weakness      Cervical / Trunk Assessment: Kyphotic  Communication   Communication: No difficulties  Cognition Arousal/Alertness: Awake/alert Behavior During Therapy: Flat affect Overall Cognitive Status: Impaired/Different from baseline Area of Impairment: Following commands;Safety/judgement;Problem solving       Following Commands: Follows one step commands consistently Safety/Judgement: Decreased awareness of safety   Problem Solving: Slow processing;Difficulty sequencing;Requires verbal cues      General Comments       Exercises        Assessment/Plan    PT Assessment Patient needs continued PT services  PT Diagnosis Generalized weakness   PT Problem List Decreased activity tolerance;Decreased balance;Decreased mobility;Decreased knowledge of use of DME;Decreased safety awareness;Decreased knowledge of precautions  PT Treatment Interventions DME instruction;Gait training;Functional mobility training;Therapeutic activities;Therapeutic exercise;Balance training;Patient/family education   PT Goals (Current goals can be found in the Care Plan section) Acute Rehab PT Goals Patient Stated Goal: to go back to NH PT Goal Formulation: With patient/family Time For Goal Achievement: 01/07/16 Potential to Achieve Goals: Good    Frequency Min 2X/week   Barriers to discharge Decreased caregiver support      Co-evaluation PT/OT/SLP Co-Evaluation/Treatment: Yes Reason for Co-Treatment: For patient/therapist safety PT goals addressed during session: Mobility/safety with mobility         End of Session Equipment Utilized During Treatment: Gait belt Activity Tolerance: Patient limited by fatigue Patient left: in chair;with call bell/phone within reach;with chair alarm set;with family/visitor present Nurse Communication: Mobility status         Time: [id number] PT Time Calculation (min) (ACUTE ONLY): 21 min   Charges:   PT Evaluation $PT Eval Moderate Complexity: 1 Procedure     PT G CodesDenice Paradise 12/29/2015, 4:43 PM Jacole Capley [facility] Acute Rehabilitation [phone] (pager)

## 2015-12-24 NOTE — Procedures (Signed)
ELECTROENCEPHALOGRAM REPORT  Date of Study: 12/24/2015  Patient's Name: Mike Williams MRN: [id number] Date of Birth: [birth year]  Referring Provider: Marzetta Board, MD  Clinical History: 82 year old man with seizure disorder and dementia presents with decreased lethargy  Medications:  0.9 % sodium chloride infusion   0.9 % sodium chloride infusion   aspirin suppository 300 mg   cefTRIAXone (ROCEPHIN) 1 g in dextrose 5 % 50 mL IVPB   heparin injection 5,000 Units   hydrALAZINE (APRESOLINE) injection 5 mg  Technical Summary: A multichannel digital EEG recording measured by the international 10-20 system with electrodes applied with paste and impedances below 5000 ohms performed in our laboratory with EKG monitoring in an awake and drowsy patient.  Hyperventilation and photic stimulation were not performed.  The digital EEG was referentially recorded, reformatted, and digitally filtered in a variety of bipolar and referential montages for optimal display.    Description: The patient is awake and drowsy during the recording.  During maximal wakefulness, there is a symmetric, medium voltage 8 Hz posterior dominant rhythm that attenuates with eye opening.  The record is symmetric.  During drowsiness, there is an increase in theta slowing of the background.  There were no epileptiform discharges or electrographic seizures seen.    EKG lead was unremarkable.  Impression: This awake and drowsy EEG is normal.    Clinical Correlation: A normal EEG does not exclude a clinical diagnosis of epilepsy.  If further clinical questions remain, prolonged EEG may be helpful.  Clinical correlation is advised.   Metta Clines, DO

## 2015-12-25 LAB — BASIC METABOLIC PANEL
ANION GAP: 9 (ref 5–15)
BUN: 11 mg/dL (ref 6–20)
CO2: 26 mmol/L (ref 22–32)
Calcium: 8.8 mg/dL — ABNORMAL LOW (ref 8.9–10.3)
Chloride: 106 mmol/L (ref 101–111)
Creatinine, Ser: 0.92 mg/dL (ref 0.61–1.24)
Glucose, Bld: 74 mg/dL (ref 65–99)
POTASSIUM: 3.3 mmol/L — AB (ref 3.5–5.1)
SODIUM: 141 mmol/L (ref 135–145)

## 2015-12-25 LAB — URINE CULTURE

## 2015-12-25 LAB — CBC
HEMATOCRIT: 36.8 % — AB (ref 39.0–52.0)
HEMOGLOBIN: 11.9 g/dL — AB (ref 13.0–17.0)
MCH: 27.9 pg (ref 26.0–34.0)
MCHC: 32.3 g/dL (ref 30.0–36.0)
MCV: 86.2 fL (ref 78.0–100.0)
Platelets: 185 10*3/uL (ref 150–400)
RBC: 4.27 MIL/uL (ref 4.22–5.81)
RDW: 13.9 % (ref 11.5–15.5)
WBC: 6.7 10*3/uL (ref 4.0–10.5)

## 2015-12-25 LAB — HEMOGLOBIN A1C
Hgb A1c MFr Bld: 5.5 % (ref 4.8–5.6)
Mean Plasma Glucose: 111 mg/dL

## 2015-12-25 MED ORDER — ASPIRIN 325 MG PO TABS
325.0000 mg | ORAL_TABLET | Freq: Every day | ORAL | Status: DC
  Administered 2015-12-25 – 2015-12-26 (×2): 325 mg via ORAL
  Filled 2015-12-25 (×2): qty 1

## 2015-12-25 MED ORDER — POTASSIUM CHLORIDE CRYS ER 20 MEQ PO TBCR
40.0000 meq | EXTENDED_RELEASE_TABLET | Freq: Four times a day (QID) | ORAL | Status: AC
  Administered 2015-12-25 (×2): 40 meq via ORAL
  Filled 2015-12-25 (×2): qty 2

## 2015-12-25 NOTE — Progress Notes (Signed)
Provided handoff to [facility] CSW. This CSW signing off.  Percell Locus Aubreana Cornacchia LCSWA [phone]

## 2015-12-25 NOTE — Progress Notes (Signed)
PROGRESS NOTE  Mike Williams  [id number] DOB: Oct 13, 1932 DOA: 12/23/2015 PCP: Garret Reddish, MD Outpatient Specialists:  Subjective: Looks much better than yesterday, able to follow simple commands and state his name. We'll get to regular floor. Was still NPO, SLP signed off, will start on soft diet.  Brief Narrative:  Mike Williams is a 82 y.o. male with medical history significant of prior CVA, seizure disorder, HTN, HLD, dementia, is being brought in from SNF with decreased responsiveness and lethargy. He cannot contribute to the story on my evaluation, unable to wake up, mumbles when called. He was brought in as a code stroke, was evaluated by Dr. Leonel Ramsay, felt not to be likely acute CVA but possible seizure. TRH asked for admission   Assessment & Plan:   Principal Problem:   UTI (urinary tract infection) Active Problems:   Alzheimer's dementia   Essential hypertension   PROSTATE CANCER, HX OF   History of CVA (cerebrovascular accident)   Seizures (HCC)   PBA (pseudobulbar affect)   Seizure ([place])   AKI (acute kidney injury) ([place])   UTI -The urinalysis was not available at the time of admission but is very consistent with UTI with TNTC WBCs. -Send urine cultures, started on Rocephin and adjust antibiotics according to culture results. -Appears to be doing better continue current treatment.  Acute metabolic encephalopathy - neurology consulted, appreciate input, seizure appears more likely, started on Ativan and Keppra  - monitor in SDU, appears to be protecting his airway, satting mid 90s on room air - This is could be secondary to the UTI, there was concern about seizure, EEG done and is normal.   Prior CVA - Aspirin PR, resume po Plavix when able   Seizure disorder - obtain EEG  AKI -Dehydration secondary to poor by mouth intake and UTI. -Creatinine 1.33 admission, improved to 1.0 with IV fluids hydration, continue IVF and check BMP in a.m.  HTN - Restart  home medications. - hydralazine prn  DVT prophylaxis: Lovenox Code Status: Partial Code Family Communication:  Disposition Plan: Transfer to regular bed. Retained to a nursing home in a.m. Diet: Diet NPO time specified  Consultants:   Neurology  Procedures:   None  Antimicrobials:   Rocephin started  Objective: Vitals:   12/24/15 2305 12/24/15 2358 12/25/15 0504 12/25/15 0800  BP:  (!) 185/89 (!) 140/93 (!) 157/79  Pulse:   74   Resp:   15 15  Temp: 98.1 F (36.7 C) 98.9 F (37.2 C) 99.2 F (37.3 C) 98.2 F (36.8 C)  TempSrc: Oral Oral Oral Oral  SpO2:   99%   Weight:      Height:        Intake/Output Summary (Last 24 hours) at 12/25/15 1115 Last data filed at 12/25/15 0800  Gross per 24 hour  Intake              425 ml  Output             1500 ml  Net            -1075 ml   Filed Weights   12/23/15 1335 12/23/15 1714  Weight: 66.9 kg (147 lb 7.8 oz) 69.8 kg (153 lb 12.8 oz)    Examination: General exam: Appears calm and comfortable  Respiratory system: Clear to auscultation. Respiratory effort normal. Cardiovascular system: S1 & S2 heard, RRR. No JVD, murmurs, rubs, gallops or clicks. No pedal edema. Gastrointestinal system: Abdomen is nondistended, soft and nontender. No organomegaly or  masses felt. Normal bowel sounds heard. Central nervous system: Alert and oriented. No focal neurological deficits. Extremities: Symmetric 5 x 5 power. Skin: No rashes, lesions or ulcers Psychiatry: Judgement and insight appear normal. Mood & affect appropriate.   Data Reviewed: I have personally reviewed following labs and imaging studies  CBC:  Recent Labs Lab 12/23/15 1320 12/23/15 1325 12/24/15 0353 12/25/15 0327  WBC 17.4*  --  7.9 6.7  NEUTROABS 13.5*  --   --   --   HGB 12.8* 13.6 11.7* 11.9*  HCT 39.9 40.0 34.6* 36.8*  MCV 88.5  --  85.0 86.2  PLT 180  --  156 [id number]   Basic Metabolic Panel:  Recent Labs Lab 12/23/15 1320 12/23/15 1325  12/24/15 0353 12/25/15 0327  NA 141 143 143 141  K 3.6 4.4 3.4* 3.3*  CL 107 107 109 106  CO2 25  --  27 26  GLUCOSE 139* 134* 77 74  BUN 16 22* 15 11  CREATININE 1.33* 1.40* 1.03 0.92  CALCIUM 8.8*  --  8.7* 8.8*   GFR: Estimated Creatinine Clearance: 59.9 mL/min (by C-G formula based on SCr of 0.92 mg/dL). Liver Function Tests:  Recent Labs Lab 12/23/15 1320 12/24/15 0353  AST 18 17  ALT 14* 13*  ALKPHOS 83 68  BILITOT 0.8 0.5  PROT 7.1 6.0*  ALBUMIN 3.2* 2.8*   No results for input(s): LIPASE, AMYLASE in the last 168 hours. No results for input(s): AMMONIA in the last 168 hours. Coagulation Profile:  Recent Labs Lab 12/23/15 1320  INR 1.16   Cardiac Enzymes: No results for input(s): CKTOTAL, CKMB, CKMBINDEX, TROPONINI in the last 168 hours. BNP (last 3 results) No results for input(s): PROBNP in the last 8760 hours. HbA1C:  Recent Labs  12/24/15 0353  HGBA1C 5.5   CBG:  Recent Labs Lab 12/23/15 1314  GLUCAP 143*   Lipid Profile:  Recent Labs  12/24/15 0353  CHOL 104  HDL 46  LDLCALC 50  TRIG 42  CHOLHDL 2.3   Thyroid Function Tests: No results for input(s): TSH, T4TOTAL, FREET4, T3FREE, THYROIDAB in the last 72 hours. Anemia Panel: No results for input(s): VITAMINB12, FOLATE, FERRITIN, TIBC, IRON, RETICCTPCT in the last 72 hours. Urine analysis:    Component Value Date/Time   COLORURINE YELLOW 12/23/2015 1805   APPEARANCEUR CLOUDY (A) 12/23/2015 1805   LABSPEC 1.017 12/23/2015 1805   PHURINE 6.5 12/23/2015 1805   GLUCOSEU NEGATIVE 12/23/2015 1805   HGBUR TRACE (A) 12/23/2015 1805   BILIRUBINUR NEGATIVE 12/23/2015 1805   BILIRUBINUR n 01/08/2012 1122   KETONESUR NEGATIVE 12/23/2015 1805   PROTEINUR 30 (A) 12/23/2015 1805   UROBILINOGEN 1.0 09/23/2014 1245   NITRITE NEGATIVE 12/23/2015 1805   LEUKOCYTESUR LARGE (A) 12/23/2015 1805   Sepsis Labs: @LABRCNTIP (procalcitonin:4,lacticidven:4)  ) Recent Results (from the past 240  hour(s))  MRSA PCR Screening     Status: None   Collection Time: 12/23/15  6:01 PM  Result Value Ref Range Status   MRSA by PCR NEGATIVE NEGATIVE Final    Comment:        The GeneXpert MRSA Assay (FDA approved for NASAL specimens only), is one component of a comprehensive MRSA colonization surveillance program. It is not intended to diagnose MRSA infection nor to guide or monitor treatment for MRSA infections.      Invalid input(s): PROCALCITONIN, LACTICACIDVEN   Radiology Studies: Ct Head Code Stroke W/o Cm  Result Date: 12/23/2015 CLINICAL DATA:  Code stroke.  Patient unresponsive and  nonverbal. EXAM: CT HEAD WITHOUT CONTRAST TECHNIQUE: Contiguous axial images were obtained from the base of the skull through the vertex without intravenous contrast. COMPARISON:  09/23/2014 CT.  Most recent MR 07/02/2011. FINDINGS: No evidence for acute cortical infarct, acute hemorrhage, mass lesion, or extra-axial fluid. Global atrophy with hydrocephalus ex vacuo. Chronic microvascular ischemic change of an extensive nature. Scattered areas of lacunar infarction. No proximal large vessel hyperdensity to suggest emergent large vessel occlusion. Vascular calcification. Calvarium intact. No sinus or mastoid disease. Compared with priors, similar appearance. ASPECTS [facility] Stroke Program Early CT Score) - Ganglionic level infarction (caudate, lentiform nuclei, internal capsule, insula, M1-M3 cortex): 7 - Supraganglionic infarction (M4-M6 cortex): 3 Total score (0-10 with 10 being normal): 10 IMPRESSION: 1. Global atrophy with extensive chronic microvascular ischemic change. No acute intracranial findings.  Grossly stable from priors. 2. ASPECTS is 10. These results were called by telephone at the time of interpretation on 12/23/2015 at 1:47 pm to Dr. Julianne Rice , who verbally acknowledged these results. Electronically Signed   By: Staci Righter [name]   On: 12/23/2015 13:49    Scheduled Meds: . aspirin   300 mg Rectal Daily  . cefTRIAXone (ROCEPHIN)  IV  1 g Intravenous Q24H  . heparin  5,000 Units Subcutaneous Q8H   Continuous Infusions:     LOS: 2 days    Time spent: 35 minutes    Quoc Tome A, MD [facility] Pager [phone]  If 7PM-7AM, please contact night-coverage [email or web address] Password [facility] 12/25/2015, 11:15 AM

## 2015-12-25 NOTE — Progress Notes (Signed)
Subjective:  Mike Williams is resting comfortably in bed. He is able to follow a few basic commands. In speaking with his nurse she reports that he actually did well with physical therapy. He needed some assistance to get out of the bed. However once up he was able to ambulate reasonably well.  Exam: Vitals:   12/25/15 0504 12/25/15 0800  BP: (!) 140/93 (!) 157/79  Pulse: 74   Resp: 15 15  Temp: 99.2 F (37.3 C) 98.2 F (36.8 C)    HEENT-  Normocephalic, no lesions, without obvious abnormality.  Normal external eye and conjunctiva.  Normal TM's bilaterally.  Normal auditory canals and external ears. Normal external nose, mucus membranes and septum.  Normal pharynx. Cardiovascular- pulses palpable throughout   Lungs- Heart exam - S1, S2 normal, no murmur, no gallop, rate regular Abdomen- soft, non-tender; bowel sounds normal; no masses,  no organomegaly Extremities- less then 2 second capillary refill   Gen: In bed, NAD MS: Mike Williams is able to respond to a few simple commands he attempts to answer a few basic questions. He seems to be at his baseline dementia. CN: Cranial nerves II through XII are intact. Motor: Motor exam is 5 out of 5. Sensory: Sensation is grossly intact. DTR: 1-2+ bilaterally  Pertinent Labs/Diagnostics: Reviewed.   Impression:   Mike Williams is an 82 year old patient who presented to the hospital with a change from his baseline dementia. He was found to have a urinary tract infection. He continues on Rocephin for the UTI.  There was also a concern for possible seizure activity. There was a concern of left hemiparesis and some abnormal facial movements. The EEG shows some mild posterior background slowing consistent with his known history of dementia. However, it is otherwise unremarkable.   He appears to remain nothing by mouth at this time. However it is likely that he could return to regular diet at this point. If he is able to tolerate by mouth his Keppra can be switched  to by mouth as well.    Recommendations:  1. Continue present therapy.  2. Return to skilled nursing facility when cleared by medicine team.   Gerda Diss. Tasia Catchings, [name] Neurohospitalist Phone: [phone]   12/25/2015, 8:32 AM

## 2015-12-26 DIAGNOSIS — G301 Alzheimer's disease with late onset: Secondary | ICD-10-CM | POA: Diagnosis not present

## 2015-12-26 DIAGNOSIS — G40909 Epilepsy, unspecified, not intractable, without status epilepticus: Secondary | ICD-10-CM | POA: Diagnosis present

## 2015-12-26 DIAGNOSIS — Z0000 Encounter for general adult medical examination without abnormal findings: Secondary | ICD-10-CM | POA: Diagnosis not present

## 2015-12-26 DIAGNOSIS — M79675 Pain in left toe(s): Secondary | ICD-10-CM | POA: Diagnosis not present

## 2015-12-26 DIAGNOSIS — Z7902 Long term (current) use of antithrombotics/antiplatelets: Secondary | ICD-10-CM | POA: Diagnosis not present

## 2015-12-26 DIAGNOSIS — S79911A Unspecified injury of right hip, initial encounter: Secondary | ICD-10-CM | POA: Diagnosis not present

## 2015-12-26 DIAGNOSIS — R509 Fever, unspecified: Secondary | ICD-10-CM | POA: Diagnosis not present

## 2015-12-26 DIAGNOSIS — R4701 Aphasia: Secondary | ICD-10-CM | POA: Diagnosis present

## 2015-12-26 DIAGNOSIS — S064X1A Epidural hemorrhage with loss of consciousness of 30 minutes or less, initial encounter: Secondary | ICD-10-CM | POA: Diagnosis not present

## 2015-12-26 DIAGNOSIS — B86 Scabies: Secondary | ICD-10-CM | POA: Diagnosis not present

## 2015-12-26 DIAGNOSIS — R1310 Dysphagia, unspecified: Secondary | ICD-10-CM | POA: Diagnosis present

## 2015-12-26 DIAGNOSIS — J09X2 Influenza due to identified novel influenza A virus with other respiratory manifestations: Secondary | ICD-10-CM | POA: Diagnosis not present

## 2015-12-26 DIAGNOSIS — R2689 Other abnormalities of gait and mobility: Secondary | ICD-10-CM | POA: Diagnosis not present

## 2015-12-26 DIAGNOSIS — F482 Pseudobulbar affect: Secondary | ICD-10-CM | POA: Diagnosis not present

## 2015-12-26 DIAGNOSIS — M25551 Pain in right hip: Secondary | ICD-10-CM | POA: Diagnosis not present

## 2015-12-26 DIAGNOSIS — G9340 Encephalopathy, unspecified: Secondary | ICD-10-CM

## 2015-12-26 DIAGNOSIS — E119 Type 2 diabetes mellitus without complications: Secondary | ICD-10-CM | POA: Diagnosis not present

## 2015-12-26 DIAGNOSIS — N390 Urinary tract infection, site not specified: Secondary | ICD-10-CM | POA: Diagnosis not present

## 2015-12-26 DIAGNOSIS — Z8673 Personal history of transient ischemic attack (TIA), and cerebral infarction without residual deficits: Secondary | ICD-10-CM | POA: Diagnosis not present

## 2015-12-26 DIAGNOSIS — M6281 Muscle weakness (generalized): Secondary | ICD-10-CM | POA: Diagnosis not present

## 2015-12-26 DIAGNOSIS — E860 Dehydration: Secondary | ICD-10-CM | POA: Diagnosis not present

## 2015-12-26 DIAGNOSIS — N3281 Overactive bladder: Secondary | ICD-10-CM | POA: Diagnosis not present

## 2015-12-26 DIAGNOSIS — N200 Calculus of kidney: Secondary | ICD-10-CM | POA: Diagnosis not present

## 2015-12-26 DIAGNOSIS — J690 Pneumonitis due to inhalation of food and vomit: Secondary | ICD-10-CM | POA: Diagnosis present

## 2015-12-26 DIAGNOSIS — F329 Major depressive disorder, single episode, unspecified: Secondary | ICD-10-CM | POA: Diagnosis not present

## 2015-12-26 DIAGNOSIS — Y999 Unspecified external cause status: Secondary | ICD-10-CM | POA: Diagnosis not present

## 2015-12-26 DIAGNOSIS — R21 Rash and other nonspecific skin eruption: Secondary | ICD-10-CM | POA: Diagnosis not present

## 2015-12-26 DIAGNOSIS — S098XXA Other specified injuries of head, initial encounter: Secondary | ICD-10-CM | POA: Diagnosis not present

## 2015-12-26 DIAGNOSIS — S7001XA Contusion of right hip, initial encounter: Secondary | ICD-10-CM | POA: Diagnosis not present

## 2015-12-26 DIAGNOSIS — I6990 Unspecified sequelae of unspecified cerebrovascular disease: Secondary | ICD-10-CM | POA: Diagnosis not present

## 2015-12-26 DIAGNOSIS — R1312 Dysphagia, oropharyngeal phase: Secondary | ICD-10-CM | POA: Diagnosis not present

## 2015-12-26 DIAGNOSIS — N179 Acute kidney failure, unspecified: Secondary | ICD-10-CM | POA: Diagnosis not present

## 2015-12-26 DIAGNOSIS — R8290 Unspecified abnormal findings in urine: Secondary | ICD-10-CM | POA: Diagnosis not present

## 2015-12-26 DIAGNOSIS — T148XXA Other injury of unspecified body region, initial encounter: Secondary | ICD-10-CM | POA: Diagnosis not present

## 2015-12-26 DIAGNOSIS — R319 Hematuria, unspecified: Secondary | ICD-10-CM | POA: Diagnosis not present

## 2015-12-26 DIAGNOSIS — E870 Hyperosmolality and hypernatremia: Secondary | ICD-10-CM | POA: Diagnosis not present

## 2015-12-26 DIAGNOSIS — Z9049 Acquired absence of other specified parts of digestive tract: Secondary | ICD-10-CM | POA: Diagnosis not present

## 2015-12-26 DIAGNOSIS — I639 Cerebral infarction, unspecified: Secondary | ICD-10-CM | POA: Diagnosis not present

## 2015-12-26 DIAGNOSIS — Y92129 Unspecified place in nursing home as the place of occurrence of the external cause: Secondary | ICD-10-CM | POA: Diagnosis not present

## 2015-12-26 DIAGNOSIS — D649 Anemia, unspecified: Secondary | ICD-10-CM | POA: Diagnosis not present

## 2015-12-26 DIAGNOSIS — S0003XA Contusion of scalp, initial encounter: Secondary | ICD-10-CM | POA: Diagnosis not present

## 2015-12-26 DIAGNOSIS — R3129 Other microscopic hematuria: Secondary | ICD-10-CM | POA: Diagnosis not present

## 2015-12-26 DIAGNOSIS — W1830XA Fall on same level, unspecified, initial encounter: Secondary | ICD-10-CM | POA: Diagnosis not present

## 2015-12-26 DIAGNOSIS — H2513 Age-related nuclear cataract, bilateral: Secondary | ICD-10-CM | POA: Diagnosis not present

## 2015-12-26 DIAGNOSIS — I351 Nonrheumatic aortic (valve) insufficiency: Secondary | ICD-10-CM | POA: Diagnosis not present

## 2015-12-26 DIAGNOSIS — Z515 Encounter for palliative care: Secondary | ICD-10-CM | POA: Diagnosis present

## 2015-12-26 DIAGNOSIS — E871 Hypo-osmolality and hyponatremia: Secondary | ICD-10-CM | POA: Diagnosis not present

## 2015-12-26 DIAGNOSIS — R0602 Shortness of breath: Secondary | ICD-10-CM | POA: Diagnosis not present

## 2015-12-26 DIAGNOSIS — J9601 Acute respiratory failure with hypoxia: Secondary | ICD-10-CM | POA: Diagnosis present

## 2015-12-26 DIAGNOSIS — R630 Anorexia: Secondary | ICD-10-CM | POA: Diagnosis not present

## 2015-12-26 DIAGNOSIS — I352 Nonrheumatic aortic (valve) stenosis with insufficiency: Secondary | ICD-10-CM | POA: Diagnosis present

## 2015-12-26 DIAGNOSIS — R41841 Cognitive communication deficit: Secondary | ICD-10-CM | POA: Diagnosis not present

## 2015-12-26 DIAGNOSIS — F445 Conversion disorder with seizures or convulsions: Secondary | ICD-10-CM | POA: Diagnosis not present

## 2015-12-26 DIAGNOSIS — G309 Alzheimer's disease, unspecified: Secondary | ICD-10-CM | POA: Diagnosis present

## 2015-12-26 DIAGNOSIS — E43 Unspecified severe protein-calorie malnutrition: Secondary | ICD-10-CM | POA: Diagnosis present

## 2015-12-26 DIAGNOSIS — R531 Weakness: Secondary | ICD-10-CM | POA: Diagnosis not present

## 2015-12-26 DIAGNOSIS — R400 Somnolence: Secondary | ICD-10-CM | POA: Diagnosis not present

## 2015-12-26 DIAGNOSIS — Z8546 Personal history of malignant neoplasm of prostate: Secondary | ICD-10-CM | POA: Diagnosis not present

## 2015-12-26 DIAGNOSIS — M542 Cervicalgia: Secondary | ICD-10-CM | POA: Diagnosis not present

## 2015-12-26 DIAGNOSIS — R569 Unspecified convulsions: Secondary | ICD-10-CM | POA: Diagnosis not present

## 2015-12-26 DIAGNOSIS — Y939 Activity, unspecified: Secondary | ICD-10-CM | POA: Diagnosis not present

## 2015-12-26 DIAGNOSIS — Z9181 History of falling: Secondary | ICD-10-CM | POA: Diagnosis not present

## 2015-12-26 DIAGNOSIS — M79674 Pain in right toe(s): Secondary | ICD-10-CM | POA: Diagnosis not present

## 2015-12-26 DIAGNOSIS — F0280 Dementia in other diseases classified elsewhere without behavioral disturbance: Secondary | ICD-10-CM | POA: Diagnosis not present

## 2015-12-26 DIAGNOSIS — I10 Essential (primary) hypertension: Secondary | ICD-10-CM | POA: Diagnosis present

## 2015-12-26 DIAGNOSIS — B370 Candidal stomatitis: Secondary | ICD-10-CM | POA: Diagnosis not present

## 2015-12-26 DIAGNOSIS — E782 Mixed hyperlipidemia: Secondary | ICD-10-CM | POA: Diagnosis not present

## 2015-12-26 DIAGNOSIS — B9620 Unspecified Escherichia coli [E. coli] as the cause of diseases classified elsewhere: Secondary | ICD-10-CM | POA: Diagnosis present

## 2015-12-26 DIAGNOSIS — G40009 Localization-related (focal) (partial) idiopathic epilepsy and epileptic syndromes with seizures of localized onset, not intractable, without status epilepticus: Secondary | ICD-10-CM | POA: Diagnosis not present

## 2015-12-26 DIAGNOSIS — R4182 Altered mental status, unspecified: Secondary | ICD-10-CM | POA: Diagnosis not present

## 2015-12-26 DIAGNOSIS — Z6829 Body mass index (BMI) 29.0-29.9, adult: Secondary | ICD-10-CM | POA: Diagnosis not present

## 2015-12-26 DIAGNOSIS — Z7952 Long term (current) use of systemic steroids: Secondary | ICD-10-CM | POA: Diagnosis not present

## 2015-12-26 DIAGNOSIS — M25559 Pain in unspecified hip: Secondary | ICD-10-CM | POA: Diagnosis not present

## 2015-12-26 DIAGNOSIS — S199XXA Unspecified injury of neck, initial encounter: Secondary | ICD-10-CM | POA: Diagnosis not present

## 2015-12-26 DIAGNOSIS — E785 Hyperlipidemia, unspecified: Secondary | ICD-10-CM | POA: Diagnosis not present

## 2015-12-26 DIAGNOSIS — Z23 Encounter for immunization: Secondary | ICD-10-CM | POA: Diagnosis not present

## 2015-12-26 DIAGNOSIS — L308 Other specified dermatitis: Secondary | ICD-10-CM | POA: Diagnosis not present

## 2015-12-26 DIAGNOSIS — Q231 Congenital insufficiency of aortic valve: Secondary | ICD-10-CM | POA: Diagnosis not present

## 2015-12-26 DIAGNOSIS — J1081 Influenza due to other identified influenza virus with encephalopathy: Secondary | ICD-10-CM | POA: Diagnosis present

## 2015-12-26 DIAGNOSIS — Z79899 Other long term (current) drug therapy: Secondary | ICD-10-CM | POA: Diagnosis not present

## 2015-12-26 DIAGNOSIS — B351 Tinea unguium: Secondary | ICD-10-CM | POA: Diagnosis not present

## 2015-12-26 DIAGNOSIS — G4733 Obstructive sleep apnea (adult) (pediatric): Secondary | ICD-10-CM | POA: Diagnosis present

## 2015-12-26 DIAGNOSIS — Z5181 Encounter for therapeutic drug level monitoring: Secondary | ICD-10-CM | POA: Diagnosis not present

## 2015-12-26 DIAGNOSIS — N3000 Acute cystitis without hematuria: Secondary | ICD-10-CM | POA: Diagnosis not present

## 2015-12-26 LAB — BASIC METABOLIC PANEL
ANION GAP: 5 (ref 5–15)
BUN: 14 mg/dL (ref 6–20)
CALCIUM: 8.5 mg/dL — AB (ref 8.9–10.3)
CO2: 26 mmol/L (ref 22–32)
CREATININE: 1.04 mg/dL (ref 0.61–1.24)
Chloride: 110 mmol/L (ref 101–111)
GLUCOSE: 119 mg/dL — AB (ref 65–99)
Potassium: 4.1 mmol/L (ref 3.5–5.1)
Sodium: 141 mmol/L (ref 135–145)

## 2015-12-26 NOTE — Progress Notes (Signed)
Nutrition Follow-up  INTERVENTION:  Encourage adequate and consistent healthful PO intake  NUTRITION DIAGNOSIS:   Inadequate oral intake related to inability to eat as evidenced by NPO status.  Resolved  GOAL:   Patient will meet greater than or equal to 90% of their needs  Progressing  MONITOR:   Diet advancement, I & O's  REASON FOR ASSESSMENT:   Low Br[place]    ASSESSMENT:   82 y.o. male with medical history significant of prior CVA, seizure disorder, HTN, HLD, dementia, is being brought in from SNF with decreased responsiveness and lethargy. Appears to have exacerbation of symptoms related to UTI.  Pt sitting up and eating lunch at time of visit. He reports that his appetite is good, with a smile on his face. He reports eating well PTA and eating most of his breakfast this morning.  He reports eating a regular diet PTA and denies use of any nutritional supplements PTA. Pt denies any questions or concerns at this time.   Labs: low calcium  Diet Order:  DIET SOFT Room service appropriate? Yes; Fluid consistency: Thin Diet - low sodium heart healthy  Skin:  Reviewed, no issues  Last BM:  8/22  Height:   Ht Readings from Last 1 Encounters:  12/23/15 5\' 7"  (1.702 m)    Weight:   Wt Readings from Last 1 Encounters:  12/23/15 153 lb 12.8 oz (69.8 kg)    Ideal Body Weight:  67.3 kg  BMI:  Body mass index is 24.09 kg/m.  Estimated Nutritional Needs:   Kcal:  1700-1900  Protein:  80-90 grams  Fluid:  1.7-1.9 L/day  EDUCATION NEEDS:   No education needs identified at this time  Scarlette Ar RD, LDN, CSP Inpatient Clinical Dietitian Pager: [phone] After Hours Pager: [phone]

## 2015-12-26 NOTE — Discharge Summary (Addendum)
Physician Discharge Summary  Mike Williams [id number] DOB: 1932-09-07  PCP: Garret Reddish, MD  Admit date: 12/23/2015 Discharge date: 12/26/2015  Ad[facility] From: [facility] Disposition:  [facility]/SNF  Recommendations for Outpatient Follow-up:  1. [name] at SNF in 3 days with repeat labs (CBC & BMP). 2. Consider outpatient Neurology consultation and follow-up. 3. Recommend outpatient Urology consultation regarding chronic microscopic hematuria evaluation and management. 4. Patient has been advised that he should not drive until he has been cleared by an MD.  Home Health: None Equipment/Devices: None    Discharge Condition: Improved and stable  CODE STATUS: Patient was DO NOT INTUBATE status while hospitalized. Diet recommendation: Heart healthy diet.  Discharge Diagnoses:  Principal Problem:   UTI (urinary tract infection) Active Problems:   Alzheimer's dementia   Essential hypertension   PROSTATE CANCER, HX OF   History of CVA (cerebrovascular accident)   Seizures (HCC)   PBA (pseudobulbar affect)   Seizure (HCC)   AKI (acute kidney injury) ([place])   Brief/Interim Summary: 82 year old male, SNF resident, PMH of CVA, seizure disorder on Keppra, HTN, HLD, dementia, was brought in from SNF with complaints of decreased responsiveness and lethargy. He was unable to provide any significant history on admission. He was initially brought in as a code stroke and was evaluated by neuro hospitalist in the ED who felt not to be likely acute CVA but possible seizure. Admitted for further evaluation and management.  Assessment and plan:  Asymptomatic bacteriuria/questionable UTI - Urine analysis showed many bacteria and WBCs. However on reviewing chart, patient has had similar urine microscopy findings dating back to October 2015. He had leukocytosis of 17,000 but no fevers. - Patient was empirically started on IV ceftriaxone pending urine culture results.  He completed 3 doses of ceftriaxone. Urine culture results suggest contamination and hence is unhelpful.  - Not entirely sure if patient truly had a urinary tract infection. He denies any urinary symptoms today and has not had fever. Will not discharge on any further antibiotics. - Recommend outpatient urology consultation due to chronic microscopic hematuria that has been present since October 2015.  Acute encephalopathy - This was felt to be the main reason for current hospitalization. Etiology of this is not entirely clear. Initially felt to be due to UTI complicating underlying dementia. Not certain if he really had a UTI but did complete 3 days of IV Rocephin for same. - Neurology was consulted and did not feel that he had an acute stroke but felt that he may have had a seizure and hence recommended Keppra 1 g followed by 500 MG twice a day and a dose of Ativan. Neurology continued to follow. Off note on review of chart, apart from the initial dose of Keppra 1 g, patient has not received any further Keppra while hospitalized. - EEG 12/24/15: Impression: This awake and drowsy EEG is normal. CT head was without acute findings. - Neurology has seen today and indicated that he appears to be back to his baseline and have signed off. I discussed with Dr. Tasia Catchings regarding AEDs and he recommended continuing prior home dose of Keppra 250 MG twice a day at discharge.  CVA history - Discussed with Dr. Tasia Catchings, Neurologist who recommends continuing Plavix at discharge and no further aspirin. He was not felt to have CVA during this admission.  Seizure disorder - And adjuvant as indicated above. Continue prior home dose of Keppra 250 MG twice a day. Recommend outpatient follow-up with neurology.  Mild acute kidney injury -  Presented with creatinine of 1.33. Briefly hydrated with IV fluids. Creatinine has normalized. Periodically follow up BMP while his home dose of ARB will be resumed at  discharge.  Essential hypertension, uncontrolled - Patient had been off of his antihypertensives during this hospitalization. Resume oral hydralazine, ARB and Plendil at discharge. Follow as outpatient.  Microscopic hematuria - Recommend outpatient urology consultation  Anemia - Stable. Outpatient follow-up and evaluation as deemed necessary.  Dementia - No agitation. As per neurology follow-up, mental status may be at baseline. Patient is on Lexapro and Nuedexta prior to this admission. Unclear indication for these medications. Recommend outpatient follow-up with [name] at SNF regarding further management of these medications.  Hypokalemia - Resolved   Discharge Instructions  Discharge Instructions    Call MD for:    Complete by:  As directed   Worsening confusion, altered mental status or seizure-like activity.   Call MD for:  temperature >100.4    Complete by:  As directed   Diet - low sodium heart healthy    Complete by:  As directed   Increase activity slowly    Complete by:  As directed       Medication List    TAKE these medications   BENICAR 20 MG tablet Generic drug:  olmesartan take 1 tablet by mouth once daily What changed:  See the new instructions.   CALAZIME SKIN PROTECTANT Pste Apply 1 application topically 2 (two) times daily.   clopidogrel 75 MG tablet Commonly known as:  PLAVIX take 1 tablet by mouth once daily What changed:  See the new instructions.   darifenacin 7.5 MG 24 hr tablet Commonly known as:  ENABLEX Take 1 tablet (7.5 mg total) by mouth daily.   escitalopram 5 MG tablet Commonly known as:  LEXAPRO Take 5 mg by mouth daily. For depression   felodipine 10 MG 24 hr tablet Commonly known as:  PLENDIL Take 10 mg by mouth daily.   hydrALAZINE 50 MG tablet Commonly known as:  APRESOLINE take 1 tablet by mouth three times a day What changed:  See the new instructions.   levETIRAcetam 250 MG tablet Commonly known as:  KEPPRA Take 1  tablet (250 mg total) by mouth 2 (two) times daily.   NUEDEXTA 20-10 MG Caps Generic drug:  Dextromethorphan-Quinidine Take 1 capsule by mouth 2 (two) times daily.       No Known Allergies  Consultations:  Neurology   Procedures/Studies: Ct Head Code Stroke W/o Cm  Result Date: 12/23/2015 CLINICAL DATA:  Code stroke.  Patient unresponsive and nonverbal. EXAM: CT HEAD WITHOUT CONTRAST TECHNIQUE: Contiguous axial images were obtained from the base of the skull through the vertex without intravenous contrast. COMPARISON:  09/23/2014 CT.  Most recent MR 07/02/2011. FINDINGS: No evidence for acute cortical infarct, acute hemorrhage, mass lesion, or extra-axial fluid. Global atrophy with hydrocephalus ex vacuo. Chronic microvascular ischemic change of an extensive nature. Scattered areas of lacunar infarction. No proximal large vessel hyperdensity to suggest emergent large vessel occlusion. Vascular calcification. Calvarium intact. No sinus or mastoid disease. Compared with priors, similar appearance. ASPECTS [facility] Stroke Program Early CT Score) - Ganglionic level infarction (caudate, lentiform nuclei, internal capsule, insula, M1-M3 cortex): 7 - Supraganglionic infarction (M4-M6 cortex): 3 Total score (0-10 with 10 being normal): 10 IMPRESSION: 1. Global atrophy with extensive chronic microvascular ischemic change. No acute intracranial findings.  Grossly stable from priors. 2. ASPECTS is 10. These results were called by telephone at the time of interpretation on 12/23/2015  at 1:47 pm to Dr. Julianne Rice , who verbally acknowledged these results. Electronically Signed   By: Staci Righter [name]   On: 12/23/2015 13:49      Subjective: - Patient denies complaints. No headache, dysuria, urinary frequency or fevers. Condom catheter shows straw-colored clear urine. As per RN, no acute issues.  Discharge Exam:  Vitals:   12/25/15 2042 12/26/15 0108 12/26/15 0452 12/26/15 0929  BP: (!) 170/89  (!) 169/95 (!) 167/72 (!) 172/95  Pulse: 71 65 60 76  Resp: 18 16 18 20   Temp: 98.8 F (37.1 C) 99.1 F (37.3 C) 98.6 F (37 C) 99 F (37.2 C)  TempSrc: Oral Oral Oral Oral  SpO2: 99% 98% 100% 100%  Weight:      Height:        General: Pleasant elderly male, moderately built and nourished, sitting up comfortably in chair without distress. Cardiovascular: S1 & S2 heard, RRR, S1/S2 +. No murmurs, rubs, gallops or clicks. No JVD or pedal edema. Respiratory: Clear to auscultation without wheezing, rhonchi or crackles. No increased work of breathing. Abdominal:  Non distended, non tender & soft. No organomegaly or masses appreciated. Normal bowel sounds heard. CNS: Alert and oriented to person, self, place and partly to time. No focal deficits. Extremities: no edema, no cyanosis    The results of significant diagnostics from this hospitalization (including imaging, microbiology, ancillary and laboratory) are listed below for reference.     Microbiology: Recent Results (from the past 240 hour(s))  MRSA PCR Screening     Status: None   Collection Time: 12/23/15  6:01 PM  Result Value Ref Range Status   MRSA by PCR NEGATIVE NEGATIVE Final    Comment:        The GeneXpert MRSA Assay (FDA approved for NASAL specimens only), is one component of a comprehensive MRSA colonization surveillance program. It is not intended to diagnose MRSA infection nor to guide or monitor treatment for MRSA infections.   Urine culture     Status: Abnormal   Collection Time: 12/24/15  2:41 PM  Result Value Ref Range Status   Specimen Description URINE, RANDOM  Final   Special Requests Rocephin, Please use the urine specimen in the lab  Final   Culture MULTIPLE SPECIES PRESENT, SUGGEST RECOLLECTION (A)  Final   Report Status 12/25/2015 FINAL  Final     Labs: BNP (last 3 results) No results for input(s): BNP in the last 8760 hours. Basic Metabolic Panel:  Recent Labs Lab 12/23/15 1320  12/23/15 1325 12/24/15 0353 12/25/15 0327 12/26/15 0406  NA 141 143 143 141 141  K 3.6 4.4 3.4* 3.3* 4.1  CL 107 107 109 106 110  CO2 25  --  27 26 26   GLUCOSE 139* 134* 77 74 119*  BUN 16 22* 15 11 14   CREATININE 1.33* 1.40* 1.03 0.92 1.04  CALCIUM 8.8*  --  8.7* 8.8* 8.5*   Liver Function Tests:  Recent Labs Lab 12/23/15 1320 12/24/15 0353  AST 18 17  ALT 14* 13*  ALKPHOS 83 68  BILITOT 0.8 0.5  PROT 7.1 6.0*  ALBUMIN 3.2* 2.8*   No results for input(s): LIPASE, AMYLASE in the last 168 hours. No results for input(s): AMMONIA in the last 168 hours. CBC:  Recent Labs Lab 12/23/15 1320 12/23/15 1325 12/24/15 0353 12/25/15 0327  WBC 17.4*  --  7.9 6.7  NEUTROABS 13.5*  --   --   --   HGB 12.8* 13.6 11.7* 11.9*  HCT 39.9 40.0 34.6* 36.8*  MCV 88.5  --  85.0 86.2  PLT 180  --  156 185   Cardiac Enzymes: No results for input(s): CKTOTAL, CKMB, CKMBINDEX, TROPONINI in the last 168 hours. BNP: Invalid input(s): POCBNP CBG:  Recent Labs Lab 12/23/15 1314  GLUCAP 143*   D-Dimer No results for input(s): DDIMER in the last 72 hours. Hgb A1c  Recent Labs  12/24/15 0353  HGBA1C 5.5   Lipid Profile  Recent Labs  12/24/15 0353  CHOL 104  HDL 46  LDLCALC 50  TRIG 42  CHOLHDL 2.3   Thyroid function studies No results for input(s): TSH, T4TOTAL, T3FREE, THYROIDAB in the last 72 hours.  Invalid input(s): FREET3 Anemia work up No results for input(s): VITAMINB12, FOLATE, FERRITIN, TIBC, IRON, RETICCTPCT in the last 72 hours. Urinalysis    Component Value Date/Time   COLORURINE YELLOW 12/23/2015 1805   APPEARANCEUR CLOUDY (A) 12/23/2015 1805   LABSPEC 1.017 12/23/2015 1805   PHURINE 6.5 12/23/2015 1805   GLUCOSEU NEGATIVE 12/23/2015 1805   HGBUR TRACE (A) 12/23/2015 1805   BILIRUBINUR NEGATIVE 12/23/2015 1805   BILIRUBINUR n 01/08/2012 1122   KETONESUR NEGATIVE 12/23/2015 1805   PROTEINUR 30 (A) 12/23/2015 1805   UROBILINOGEN 1.0 09/23/2014  1245   NITRITE NEGATIVE 12/23/2015 1805   LEUKOCYTESUR LARGE (A) 12/23/2015 1805   Sepsis Labs Invalid input(s): PROCALCITONIN,  WBC,  LACTICIDVEN    Time coordinating discharge: Over 30 minutes  SIGNED:  Vernell Leep, MD, FACP, FHM. [facility] Pager [phone]  If 7PM-7AM, please contact night-coverage [email or web address] Password [facility] 12/26/2015, 12:39 PM

## 2015-12-26 NOTE — Progress Notes (Signed)
Physical Therapy Treatment Patient Details Name: Mike Williams MRN: [id number] DOB: 1933/02/15 Today's Date: 12/26/2015    History of Present Illness 82 year old male presenting with altered mental status and left facial clonic activity.  Hx CVA, advanced dementia with move to SNF in March 2017.Marland Kitchen       PT Comments    Patient continues to need +2 assist for OOB transfers. Current plan remains appropriate.   Follow Up Recommendations  SNF;Supervision/Assistance - 24 hour     Equipment Recommendations  Other (comment) (TBA)    Recommendations for Other Services       Precautions / Restrictions Precautions Precautions: Fall Restrictions Weight Bearing Restrictions: No    Mobility  Bed Mobility Overal bed mobility: Needs Assistance;+2 for physical assistance Bed Mobility: Supine to Sit     Supine to sit: Min assist;HOB elevated     General bed mobility comments: assist to bring bilat LE to EOB and elevate trunk; increased time needed but pt able to scoot to EOB with cues  Transfers Overall transfer level: Needs assistance Equipment used: 2 person hand held assist Transfers: Sit to/from [facility] Sit to Stand: Min assist;+2 physical assistance Stand pivot transfers: Mod assist;+2 physical assistance       General transfer comment: sit to stands X3; pt incontinent of bowel and total A for pericare; min A for balance upon standing and mod A to take pivotal steps to recliner; cues for sequencing and technique; pt maintained bilat knee and trunk flexion   Ambulation/Gait                 Stairs            Wheelchair Mobility    Modified Rankin (Stroke Patients Only)       Balance Overall balance assessment: Needs assistance   Sitting balance-Leahy Scale: Fair       Standing balance-Leahy Scale: Poor                      Cognition Arousal/Alertness: Awake/alert Behavior During Therapy: Flat affect Overall Cognitive  Status: Impaired/Different from baseline Area of Impairment: Following commands;Safety/judgement;Problem solving     Memory: Decreased short-term memory Following Commands: Follows one step commands consistently Safety/Judgement: Decreased awareness of safety   Problem Solving: Slow processing;Difficulty sequencing;Requires verbal cues      Exercises      General Comments        Pertinent Vitals/Pain Pain Assessment: No/denies pain    Home Living                      Prior Function            PT Goals (current goals can now be found in the care plan section) Acute Rehab PT Goals Patient Stated Goal: to go back to NH PT Goal Formulation: With patient/family Time For Goal Achievement: 01/07/16 Potential to Achieve Goals: Good Progress towards PT goals: Progressing toward goals    Frequency  Min 2X/week    PT Plan Current plan remains appropriate    Co-evaluation             End of Session Equipment Utilized During Treatment: Gait belt Activity Tolerance: Patient tolerated treatment well Patient left: in chair;with call bell/phone within reach;with chair alarm set     Time: [id number] PT Time Calculation (min) (ACUTE ONLY): 24 min  Charges:  $Therapeutic Activity: 23-37 mins  G Codes:      Salina April, PTA Pager: [phone]   12/26/2015, 11:57 AM

## 2015-12-26 NOTE — Care Management Important Message (Signed)
Important Message  Patient Details  Name: Mike Williams MRN: [id number] Date of Birth: [birth year]   Medicare Important Message Given:  Yes    [name] 12/26/2015, 11:29 AM

## 2015-12-26 NOTE — Progress Notes (Signed)
Subjective:  Mike Williams is having breakfast. He is more alert and responsive today. It seems he is likely back to his baseline at this point. There is no evidence of any new seizure activity.  Exam: Vitals:   12/26/15 0452 12/26/15 0929  BP: (!) 167/72 (!) 172/95  Pulse: 60 76  Resp: 18 20  Temp: 98.6 F (37 C) 99 F (37.2 C)    HEENT-  Normocephalic, no lesions, without obvious abnormality.  Normal external eye and conjunctiva.  Normal TM's bilaterally.  Normal auditory canals and external ears. Normal external nose, mucus membranes and septum.  Normal pharynx. Cardiovascular- regular rate and rhythm, S1, S2 normal, no murmur, click, rub or gallop, pulses palpable throughout   Lungs- chest clear, no wheezing, rales, normal symmetric air entry, Heart exam - S1, S2 normal, no murmur, no gallop, rate regular Abdomen- soft, non-tender; bowel sounds normal; no masses,  no organomegaly Extremities- less then 2 second capillary refill  Gen: In bed, NAD MS: Awake alert and responsive. Answering questions appropriately. Difficulty with more detailed Mini-Mental Status questioning. CN: Cranial nerves II through XII are intact. Motor: 5 out of 5 bilaterally. Sensory: Grossly intact bilaterally. DTR: 1-2+ bilaterally.  Pertinent Labs/Diagnostics: Reviewed.    Impression:   Mike Williams is significantly improved. It appears he is likely back to his baseline. Perhaps his altered mental status was more related to his urinary tract infection. There has been no evidence of seizure activity. He continues on the [place] at this point. He seems to be doing quite well.   Recommendations:  1. Given that Mike Williams appears back to his baseline neurology will sign off at this point. Please reconsult with any new issues.   James A. Tasia Catchings, [name] Neurohospitalist Phone: [phone]   12/26/2015, 10:49 AM

## 2015-12-26 NOTE — Progress Notes (Signed)
Pt with discharge orders.  Report called to [facility].  Transported via [facility].  Cori Razor, RN

## 2015-12-26 NOTE — Care Management Note (Signed)
Case Management Note  Patient Details  Name: Mike Williams MRN: [id number] Date of Birth: Dec 07, 1932  Subjective/Objective:                    Action/Plan: Pt discharging back to [facility] today. No further needs per CM.   Expected Discharge Date:  12/28/15               Expected Discharge Plan:  Skilled Nursing Facility  In-House Referral:  Clinical Social Work  Discharge planning Services  CM Consult  Post Acute Care Choice:    Choice offered to:     DME Arranged:    DME Agency:     HH Arranged:    [place] Agency:     Status of Service:  Completed, signed off  If discussed at [employer], dates discussed:    Additional Comments:  Pollie Friar, RN 12/26/2015, 3:03 PM

## 2015-12-29 LAB — CBC AND DIFFERENTIAL
HCT: 32 % — AB (ref 41–53)
HCT: 32 % — AB (ref 41–53)
HEMOGLOBIN: 10.9 g/dL — AB (ref 13.5–17.5)
HEMOGLOBIN: 10.9 g/dL — AB (ref 13.5–17.5)
Platelets: 182 10*3/uL (ref 150–399)
Platelets: 182 10*3/uL (ref 150–399)
WBC: 4.7 10*3/mL
WBC: 4.7 10^3/mL

## 2015-12-29 LAB — BASIC METABOLIC PANEL
BUN: 19 mg/dL (ref 4–21)
BUN: 19 mg/dL (ref 4–21)
CREATININE: 1 mg/dL (ref 0.6–1.3)
CREATININE: 1 mg/dL (ref 0.6–1.3)
GLUCOSE: 82 mg/dL
GLUCOSE: 82 mg/dL
POTASSIUM: 3.8 mmol/L (ref 3.4–5.3)
POTASSIUM: 3.8 mmol/L (ref 3.4–5.3)
SODIUM: 147 mmol/L (ref 137–147)
SODIUM: 147 mmol/L (ref 137–147)

## 2015-12-31 ENCOUNTER — Non-Acute Institutional Stay (SKILLED_NURSING_FACILITY): Payer: Medicare Other | Admitting: Internal Medicine

## 2015-12-31 ENCOUNTER — Encounter: Payer: Self-pay | Admitting: Internal Medicine

## 2015-12-31 DIAGNOSIS — Z8673 Personal history of transient ischemic attack (TIA), and cerebral infarction without residual deficits: Secondary | ICD-10-CM

## 2015-12-31 DIAGNOSIS — R3129 Other microscopic hematuria: Secondary | ICD-10-CM

## 2015-12-31 DIAGNOSIS — I10 Essential (primary) hypertension: Secondary | ICD-10-CM

## 2015-12-31 DIAGNOSIS — R569 Unspecified convulsions: Secondary | ICD-10-CM

## 2015-12-31 DIAGNOSIS — F482 Pseudobulbar affect: Secondary | ICD-10-CM | POA: Diagnosis not present

## 2015-12-31 DIAGNOSIS — G309 Alzheimer's disease, unspecified: Secondary | ICD-10-CM

## 2015-12-31 DIAGNOSIS — F0280 Dementia in other diseases classified elsewhere without behavioral disturbance: Secondary | ICD-10-CM

## 2015-12-31 NOTE — Progress Notes (Signed)
Patient ID: Mike Williams, male   DOB: [birth year], 82 y.o.   MRN: [id number]    DATE: 12/31/2015  Location:    [place] Room Number: 107 A Place of Service: SNF (31)   Extended Emergency Contact Information Primary Emergency Contact: Zee,[name] Address: [address]          [place], [place] [address] [place] Phone: [phone] Relation: Spouse  Advanced Directive information FULL CODE  Chief Complaint  Patient presents with  . Readmit To SNF    HPI:  82 yo long term resident seen today as a readmission into SNF following hospital stay for UTI, AKI, microscopic hematuria, Alzheimer's dementia, HTN, hx prostate CA, hx CVA, PBA, hx anemia. He presented with decreased LOC and lethargy. UA abnormal but similar to October '15. WBCs 17.4K; urine cx s/o contaminate; he was tx with 3 days IV rocephin. Neurology followed and CVA r/o. Sz med adjusted. EEG nml. CT head showed no acute process. Hgb 11.9; K+ 4.1; Cr 1.33--> 1.04; albumin 2.8; A1c 5.5%; WBC 4.7K at d/c. He presents to SNF for continued long term care  He has no c/o today. No nursing issues. No falls. Sleeps ok and appetite is ok. He is a poor historian due to dementia. Hx obtained from chart  Hypertension/hx aortic insufficiency - stable on benicar 20 mg daily; apresoline  50 mg three times daily; plendil 10 mg daily   Seizure d/o - stable on keppra 250 mg twice daily   Urge incontinence - stable on enablex 7.5 mg daily  Hx CVA - stable on plavix 75 mg daily   Alzheimer's disease - does not take any cognitive medications. Albumin 2.8.    PBA - stable on nuedexta 20-10 mg twice daily   Depression - stable on lexapro 5 mg daily   Past Medical History:  Diagnosis Date  . Aortic insufficiency 07/04/2011   2/13 Study Conclusions  - Left ventricle: Systolic function was normal. The estimated ejection fraction was in the range of 55% to 60%. - Aortic valve: Moderate regurgitation. - Aorta:  The aorta was moderately dilated. Transthoracic echocardiography. M-mode, complete 2D, spectral Doppler, and color Doppler. Height: Height: 170.2cm. Height: 67in. Weight: Weight: 84.1kg. Weight: 185lb. Body mass index: BMI: 29kg/m^2. Body surface area: BSA: 2.49m2. Blood pressure: 175/86. Patient status: Inpatient. Location: Bedside.      . Aortic root dilatation (HCC)    Noted on 09/2010 echo, with mild-mod AI  . CEREBROVASCULAR ACCIDENT, HX OF 02/09/2007  . DEMENTIA 05/12/2007  . Hemorrhoids   . HYPERGLYCEMIA 09/21/2009  . HYPERTENSION 12/29/2006  . Lactose intolerance   . OBSTRUCTIVE SLEEP APNEA 12/29/2006  . PROSTATE CANCER, HX OF 12/29/2006  . Prostatitis   . Risk for falls   . Seizures ([facility]     Past Surgical History:  Procedure Laterality Date  . CHOLECYSTECTOMY    . GANGLION CYST EXCISION    . PROSTATE SURGERY     cryotherapy    Patient Care Team: SMarin Olp MD as PCP - General (Family Medicine)  Social History   Social History  . Marital status: Married    Spouse name: N/A  . Number of children: 2  . Years of education: master's   Occupational History  . retired    Social History Main Topics  . Smoking status: Never Smoker  . Smokeless tobacco: Never Used  . Alcohol use No  . Drug use: No  . Sexual activity: Not on file  Other Topics Concern  . Not on file   Social History Narrative   Married 52 years in 2016 to wife [name] (goes to [place] urgent care). 2 grown sons. 2 grandkids-boy and girl.       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       Patient does not drink caffeine.   Patient is right handed.     reports that he has never smoked. He has never used smokeless tobacco. He reports that he does not drink alcohol or use drugs.  Family History  Problem Relation Age of Onset  . Heart attack Father   . Heart disease Father   . Cancer Sister    Family Status  Relation Status  . Father Deceased  . Sister Deceased at age 45  . Mother  Deceased at age 14   never had contact with  . Brother Pension scheme manager History  Administered Date(s) Administered  . Influenza Split 01/22/2011, 01/15/2012  . Influenza Whole 04/04/2006, 05/12/2007, 01/04/2010  . Influenza,inj,Quad PF,36+ Mos 02/01/2013, 02/14/2014, 01/11/2015  . PPD Test 05/22/2015  . Pneumococcal Conjugate-13 01/11/2015  . Pneumococcal Polysaccharide-23 05/12/2007  . Td 09/23/1998    No Known Allergies  Medications: Patient's Medications  New Prescriptions   No medications on file  Previous Medications   BENICAR 20 MG TABLET    take 1 tablet by mouth once daily   CLOPIDOGREL (PLAVIX) 75 MG TABLET    take 1 tablet by mouth once daily   DARIFENACIN (ENABLEX) 7.5 MG 24 HR TABLET    Take 1 tablet (7.5 mg total) by mouth daily.   DEXTROMETHORPHAN-QUINIDINE (NUEDEXTA) 20-10 MG CAPS    Take 1 capsule by mouth 2 (two) times daily.   ESCITALOPRAM (LEXAPRO) 5 MG TABLET    Take 5 mg by mouth daily. For depression   FELODIPINE (PLENDIL) 10 MG 24 HR TABLET    Take 10 mg by mouth daily.   HYDRALAZINE (APRESOLINE) 50 MG TABLET    take 1 tablet by mouth three times a day   LEVETIRACETAM (KEPPRA) 250 MG TABLET    Take 1 tablet (250 mg total) by mouth 2 (two) times daily.   SKIN PROTECTANTS, MISC. (CALAZIME SKIN PROTECTANT) PSTE    Apply 1 application topically 2 (two) times daily.  Modified Medications   No medications on file  Discontinued Medications   No medications on file    Review of Systems  Unable to perform ROS: Dementia    Vitals:   12/31/15 1043  BP: (!) 149/78  Pulse: (!) 58  Resp: 18  Temp: 98.6 F (37 C)  TempSrc: Oral  SpO2: 97%  Weight: 151 lb (68.5 kg)  Height: 5' 7"  (1.702 m)   Body mass index is 23.65 kg/m.  Physical Exam  Constitutional: He appears well-developed.  Frail appearing sitting in w/c in NAD  HENT:  Mouth/Throat: Oropharynx is clear and moist.  Eyes: Pupils are equal, round, and reactive to light. No scleral icterus.   Neck: Neck supple. Carotid bruit is not present. No thyromegaly present.  Cardiovascular: Regular rhythm and intact distal pulses.  Bradycardia present.  Exam reveals no gallop and no friction rub.   Murmur heard.  Systolic murmur is present with a grade of 1/6  no distal LE swelling. No calf TTP  Pulmonary/Chest: Effort normal and breath sounds normal. He has no wheezes. He has no rales. He exhibits no tenderness.  Abdominal: Soft. Bowel sounds are normal. He exhibits no distension, no  abdominal bruit, no pulsatile midline mass and no mass. There is no hepatomegaly. There is no tenderness. There is no rebound and no guarding.  Musculoskeletal: He exhibits edema.  Lymphadenopathy:    He has no cervical adenopathy.  Neurological: He is alert.  Skin: Skin is warm and dry. No rash noted.  Psychiatric: He has a normal mood and affect. His behavior is normal.     Labs reviewed: Nursing Home on 12/31/2015  Component Date Value Ref Range Status  . Hemoglobin 12/29/2015 10.9* 13.5 - 17.5 g/dL Final  . HCT 12/29/2015 32* 41 - 53 % Final  . Platelets 12/29/2015 182  150 - 399 K/L Final  . WBC 12/29/2015 4.7  10^3/mL Final  . Glucose 12/29/2015 82  mg/dL Final  . BUN 12/29/2015 19  4 - 21 mg/dL Final  . Creatinine 12/29/2015 1.0  0.6 - 1.3 mg/dL Final  . Potassium 12/29/2015 3.8  3.4 - 5.3 mmol/L Final  . Sodium 12/29/2015 147  137 - 147 mmol/L Final  Admission on 12/23/2015, Discharged on 12/26/2015  Component Date Value Ref Range Status  . Sodium 12/23/2015 143  135 - 145 mmol/L Final  . Potassium 12/23/2015 4.4  3.5 - 5.1 mmol/L Final  . Chloride 12/23/2015 107  101 - 111 mmol/L Final  . BUN 12/23/2015 22* 6 - 20 mg/dL Final  . Creatinine, Ser 12/23/2015 1.40* 0.61 - 1.24 mg/dL Final  . Glucose, Bld 12/23/2015 134* 65 - 99 mg/dL Final  . Calcium, Ion 12/23/2015 0.96* 1.12 - 1.23 mmol/L Final  . TCO2 12/23/2015 27  0 - 100 mmol/L Final  . Hemoglobin 12/23/2015 13.6  13.0 - 17.0 g/dL  Final  . HCT 12/23/2015 40.0  39.0 - 52.0 % Final  . Alcohol, Ethyl (B) 12/23/2015 <5  <5 mg/dL Final   Comment:        LOWEST DETECTABLE LIMIT FOR SERUM ALCOHOL IS 5 mg/dL FOR MEDICAL PURPOSES ONLY   . Prothrombin Time 12/23/2015 14.8  11.4 - 15.2 seconds Final  . INR 12/23/2015 1.16   Final  . aPTT 12/23/2015 32  24 - 36 seconds Final  . WBC 12/23/2015 17.4* 4.0 - 10.5 K/uL Final  . RBC 12/23/2015 4.51  4.22 - 5.81 MIL/uL Final  . Hemoglobin 12/23/2015 12.8* 13.0 - 17.0 g/dL Final  . HCT 12/23/2015 39.9  39.0 - 52.0 % Final  . MCV 12/23/2015 88.5  78.0 - 100.0 fL Final  . MCH 12/23/2015 28.4  26.0 - 34.0 pg Final  . MCHC 12/23/2015 32.1  30.0 - 36.0 g/dL Final  . RDW 12/23/2015 14.2  11.5 - 15.5 % Final  . Platelets 12/23/2015 180  150 - 400 K/uL Final  . Neutrophils Relative % 12/23/2015 78  % Final  . Neutro Abs 12/23/2015 13.5* 1.7 - 7.7 K/uL Final  . Lymphocytes Relative 12/23/2015 15  % Final  . Lymphs Abs 12/23/2015 2.6  0.7 - 4.0 K/uL Final  . Monocytes Relative 12/23/2015 7  % Final  . Monocytes Absolute 12/23/2015 1.2* 0.1 - 1.0 K/uL Final  . Eosinophils Relative 12/23/2015 0  % Final  . Eosinophils Absolute 12/23/2015 0.0  0.0 - 0.7 K/uL Final  . Basophils Relative 12/23/2015 0  % Final  . Basophils Absolute 12/23/2015 0.0  0.0 - 0.1 K/uL Final  . Sodium 12/23/2015 141  135 - 145 mmol/L Final  . Potassium 12/23/2015 3.6  3.5 - 5.1 mmol/L Final  . Chloride 12/23/2015 107  101 - 111 mmol/L Final  .  CO2 12/23/2015 25  22 - 32 mmol/L Final  . Glucose, Bld 12/23/2015 139* 65 - 99 mg/dL Final  . BUN 12/23/2015 16  6 - 20 mg/dL Final  . Creatinine, Ser 12/23/2015 1.33* 0.61 - 1.24 mg/dL Final  . Calcium 12/23/2015 8.8* 8.9 - 10.3 mg/dL Final  . Total Protein 12/23/2015 7.1  6.5 - 8.1 g/dL Final  . Albumin 12/23/2015 3.2* 3.5 - 5.0 g/dL Final  . AST 12/23/2015 18  15 - 41 U/L Final  . ALT 12/23/2015 14* 17 - 63 U/L Final  . Alkaline Phosphatase 12/23/2015 83  38 - 126  U/L Final  . Total Bilirubin 12/23/2015 0.8  0.3 - 1.2 mg/dL Final  . GFR calc non Af Amer 12/23/2015 49* >60 mL/min Final  . GFR calc Af Amer 12/23/2015 57* >60 mL/min Final   Comment: (NOTE) The eGFR has been calculated using the CKD EPI equation. This calculation has not been validated in all clinical situations. eGFR's persistently <60 mL/min signify possible Chronic Kidney Disease.   . Anion gap 12/23/2015 9  5 - 15 Final  . Troponin i, poc 12/23/2015 0.00  0.00 - 0.08 ng/mL Final  . Comment 3 12/23/2015          Final   Comment: Due to the release kinetics of cTnI, a negative result within the first hours of the onset of symptoms does not rule out myocardial infarction with certainty. If myocardial infarction is still suspected, repeat the test at appropriate intervals.   . Opiates 12/23/2015 NONE DETECTED  NONE DETECTED Final  . Cocaine 12/23/2015 NONE DETECTED  NONE DETECTED Final  . Benzodiazepines 12/23/2015 NONE DETECTED  NONE DETECTED Final  . Amphetamines 12/23/2015 NONE DETECTED  NONE DETECTED Final  . Tetrahydrocannabinol 12/23/2015 NONE DETECTED  NONE DETECTED Final  . Barbiturates 12/23/2015 NONE DETECTED  NONE DETECTED Final   Comment:        DRUG SCREEN FOR MEDICAL PURPOSES ONLY.  IF CONFIRMATION IS NEEDED FOR ANY PURPOSE, NOTIFY LAB WITHIN 5 DAYS.        LOWEST DETECTABLE LIMITS FOR URINE DRUG SCREEN Drug Class       Cutoff (ng/mL) Amphetamine      1000 Barbiturate      200 Benzodiazepine   440 Tricyclics       347 Opiates          300 Cocaine          300 THC              50   . Color, Urine 12/23/2015 YELLOW  YELLOW Final  . APPearance 12/23/2015 CLOUDY* CLEAR Final  . Specific Gravity, Urine 12/23/2015 1.017  1.005 - 1.030 Final  . pH 12/23/2015 6.5  5.0 - 8.0 Final  . Glucose, UA 12/23/2015 NEGATIVE  NEGATIVE mg/dL Final  . Hgb urine dipstick 12/23/2015 TRACE* NEGATIVE Final  . Bilirubin Urine 12/23/2015 NEGATIVE  NEGATIVE Final  . Ketones, ur  12/23/2015 NEGATIVE  NEGATIVE mg/dL Final  . Protein, ur 12/23/2015 30* NEGATIVE mg/dL Final  . Nitrite 12/23/2015 NEGATIVE  NEGATIVE Final  . Leukocytes, UA 12/23/2015 LARGE* NEGATIVE Final  . Lactic Acid, Venous 12/23/2015 1.96* 0.5 - 1.9 mmol/L Final  . Glucose-Capillary 12/23/2015 143* 65 - 99 mg/dL Final  . Comment 1 12/23/2015 Notify RN   Final  . Hgb A1c MFr Bld 12/25/2015 5.5  4.8 - 5.6 % Final   Comment: (NOTE)         Pre-diabetes: 5.7 - 6.4  Diabetes: >6.4         Glycemic control for adults with diabetes: <7.0   . Mean Plasma Glucose 12/25/2015 111  mg/dL Final   Comment: (NOTE) Performed At: [facility] [place], [place] [id number] Lindon Romp MD UO:[id number]   . Sodium 12/24/2015 143  135 - 145 mmol/L Final  . Potassium 12/24/2015 3.4* 3.5 - 5.1 mmol/L Final  . Chloride 12/24/2015 109  101 - 111 mmol/L Final  . CO2 12/24/2015 27  22 - 32 mmol/L Final  . Glucose, Bld 12/24/2015 77  65 - 99 mg/dL Final  . BUN 12/24/2015 15  6 - 20 mg/dL Final  . Creatinine, Ser 12/24/2015 1.03  0.61 - 1.24 mg/dL Final  . Calcium 12/24/2015 8.7* 8.9 - 10.3 mg/dL Final  . Total Protein 12/24/2015 6.0* 6.5 - 8.1 g/dL Final  . Albumin 12/24/2015 2.8* 3.5 - 5.0 g/dL Final  . AST 12/24/2015 17  15 - 41 U/L Final  . ALT 12/24/2015 13* 17 - 63 U/L Final  . Alkaline Phosphatase 12/24/2015 68  38 - 126 U/L Final  . Total Bilirubin 12/24/2015 0.5  0.3 - 1.2 mg/dL Final  . GFR calc non Af Amer 12/24/2015 >60  >60 mL/min Final  . GFR calc Af Amer 12/24/2015 >60  >60 mL/min Final   Comment: (NOTE) The eGFR has been calculated using the CKD EPI equation. This calculation has not been validated in all clinical situations. eGFR's persistently <60 mL/min signify possible Chronic Kidney Disease.   . Anion gap 12/24/2015 7  5 - 15 Final  . WBC 12/24/2015 7.9  4.0 - 10.5 K/uL Final  . RBC 12/24/2015 4.07* 4.22 - 5.81 MIL/uL Final  . Hemoglobin 12/24/2015 11.7*  13.0 - 17.0 g/dL Final  . HCT 12/24/2015 34.6* 39.0 - 52.0 % Final  . MCV 12/24/2015 85.0  78.0 - 100.0 fL Final  . MCH 12/24/2015 28.7  26.0 - 34.0 pg Final  . MCHC 12/24/2015 33.8  30.0 - 36.0 g/dL Final  . RDW 12/24/2015 14.2  11.5 - 15.5 % Final  . Platelets 12/24/2015 156  150 - 400 K/uL Final  . MRSA by PCR 12/23/2015 NEGATIVE  NEGATIVE Final   Comment:        The GeneXpert MRSA Assay (FDA approved for NASAL specimens only), is one component of a comprehensive MRSA colonization surveillance program. It is not intended to diagnose MRSA infection nor to guide or monitor treatment for MRSA infections.   . Squamous Epithelial / LPF 12/23/2015 0-5* NONE SEEN Final  . WBC, UA 12/23/2015 TOO NUMEROUS TO COUNT  0 - 5 WBC/hpf Final  . RBC / HPF 12/23/2015 6-30  0 - 5 RBC/hpf Final  . Bacteria, UA 12/23/2015 MANY* NONE SEEN Final  . Cholesterol 12/24/2015 104  0 - 200 mg/dL Final  . Triglycerides 12/24/2015 42  <150 mg/dL Final  . HDL 12/24/2015 46  >40 mg/dL Final  . Total CHOL/HDL Ratio 12/24/2015 2.3  RATIO Final  . VLDL 12/24/2015 8  0 - 40 mg/dL Final  . LDL Cholesterol 12/24/2015 50  0 - 99 mg/dL Final   Comment:        Total Cholesterol/HDL:CHD Risk Coronary Heart Disease Risk Table                     Men   Women  1/2 Average Risk   3.4   3.3  Average Risk       5.0  4.4  2 X Average Risk   9.6   7.1  3 X Average Risk  23.4   11.0        Use the calculated Patient Ratio above and the CHD Risk Table to determine the patient's CHD Risk.        ATP III CLASSIFICATION (LDL):  <100     mg/dL   Optimal  100-129  mg/dL   Near or Above                    Optimal  130-159  mg/dL   Borderline  160-189  mg/dL   High  >190     mg/dL   Very High   . Specimen Description 12/25/2015 URINE, RANDOM   Final  . Special Requests 12/25/2015 Rocephin, Please use the urine specimen in the lab   Final  . Culture 12/25/2015 MULTIPLE SPECIES PRESENT, SUGGEST RECOLLECTION*  Final  .  Report Status 12/25/2015 12/25/2015 FINAL   Final  . Sodium 12/25/2015 141  135 - 145 mmol/L Final  . Potassium 12/25/2015 3.3* 3.5 - 5.1 mmol/L Final  . Chloride 12/25/2015 106  101 - 111 mmol/L Final  . CO2 12/25/2015 26  22 - 32 mmol/L Final  . Glucose, Bld 12/25/2015 74  65 - 99 mg/dL Final  . BUN 12/25/2015 11  6 - 20 mg/dL Final  . Creatinine, Ser 12/25/2015 0.92  0.61 - 1.24 mg/dL Final  . Calcium 12/25/2015 8.8* 8.9 - 10.3 mg/dL Final  . GFR calc non Af Amer 12/25/2015 >60  >60 mL/min Final  . GFR calc Af Amer 12/25/2015 >60  >60 mL/min Final   Comment: (NOTE) The eGFR has been calculated using the CKD EPI equation. This calculation has not been validated in all clinical situations. eGFR's persistently <60 mL/min signify possible Chronic Kidney Disease.   . Anion gap 12/25/2015 9  5 - 15 Final  . WBC 12/25/2015 6.7  4.0 - 10.5 K/uL Final  . RBC 12/25/2015 4.27  4.22 - 5.81 MIL/uL Final  . Hemoglobin 12/25/2015 11.9* 13.0 - 17.0 g/dL Final  . HCT 12/25/2015 36.8* 39.0 - 52.0 % Final  . MCV 12/25/2015 86.2  78.0 - 100.0 fL Final  . MCH 12/25/2015 27.9  26.0 - 34.0 pg Final  . MCHC 12/25/2015 32.3  30.0 - 36.0 g/dL Final  . RDW 12/25/2015 13.9  11.5 - 15.5 % Final  . Platelets 12/25/2015 185  150 - 400 K/uL Final  . Sodium 12/26/2015 141  135 - 145 mmol/L Final  . Potassium 12/26/2015 4.1  3.5 - 5.1 mmol/L Final  . Chloride 12/26/2015 110  101 - 111 mmol/L Final  . CO2 12/26/2015 26  22 - 32 mmol/L Final  . Glucose, Bld 12/26/2015 119* 65 - 99 mg/dL Final  . BUN 12/26/2015 14  6 - 20 mg/dL Final  . Creatinine, Ser 12/26/2015 1.04  0.61 - 1.24 mg/dL Final  . Calcium 12/26/2015 8.5* 8.9 - 10.3 mg/dL Final  . GFR calc non Af Amer 12/26/2015 >60  >60 mL/min Final  . GFR calc Af Amer 12/26/2015 >60  >60 mL/min Final   Comment: (NOTE) The eGFR has been calculated using the CKD EPI equation. This calculation has not been validated in all clinical situations. eGFR's persistently  <60 mL/min signify possible Chronic Kidney Disease.   . Anion gap 12/26/2015 5  5 - 15 Final  Nursing Home on 10/10/2015  Component Date Value Ref Range Status  . Hemoglobin  07/05/2015 13.8  13.5 - 17.5 g/dL Final  . HCT 07/05/2015 41  41 - 53 % Final  . Platelets 07/05/2015 236  150 - 399 K/L Final  . WBC 07/05/2015 6.6  10^3/mL Final  . Glucose 07/05/2015 86  mg/dL Final  . BUN 07/05/2015 13  4 - 21 mg/dL Final  . Creatinine 07/05/2015 0.9  0.6 - 1.3 mg/dL Final  . Potassium 07/05/2015 4.1  3.4 - 5.3 mmol/L Final  . Sodium 07/05/2015 144  137 - 147 mmol/L Final  . Alkaline Phosphatase 07/05/2015 82  25 - 125 U/L Final  . ALT 07/05/2015 7* 10 - 40 U/L Final  . AST 07/05/2015 12* 14 - 40 U/L Final  . Bilirubin, Total 07/05/2015 0.4  mg/dL Final    Ct Head Code Stroke W/o Cm  Result Date: 12/23/2015 CLINICAL DATA:  Code stroke.  Patient unresponsive and nonverbal. EXAM: CT HEAD WITHOUT CONTRAST TECHNIQUE: Contiguous axial images were obtained from the base of the skull through the vertex without intravenous contrast. COMPARISON:  09/23/2014 CT.  Most recent MR 07/02/2011. FINDINGS: No evidence for acute cortical infarct, acute hemorrhage, mass lesion, or extra-axial fluid. Global atrophy with hydrocephalus ex vacuo. Chronic microvascular ischemic change of an extensive nature. Scattered areas of lacunar infarction. No proximal large vessel hyperdensity to suggest emergent large vessel occlusion. Vascular calcification. Calvarium intact. No sinus or mastoid disease. Compared with priors, similar appearance. ASPECTS [facility] Stroke Program Early CT Score) - Ganglionic level infarction (caudate, lentiform nuclei, internal capsule, insula, M1-M3 cortex): 7 - Supraganglionic infarction (M4-M6 cortex): 3 Total score (0-10 with 10 being normal): 10 IMPRESSION: 1. Global atrophy with extensive chronic microvascular ischemic change. No acute intracranial findings.  Grossly stable from priors. 2.  ASPECTS is 10. These results were called by telephone at the time of interpretation on 12/23/2015 at 1:47 pm to Dr. Julianne Rice , who verbally acknowledged these results. Electronically Signed   By: Staci Righter [name]   On: 12/23/2015 13:49     Assessment/Plan   ICD-9-CM ICD-10-CM   1. Alzheimer's dementia 331.0 G30.9    294.10 F02.80   2. Microscopic hematuria 599.72 R31.29   3. Seizures (HCC) 780.39 R56.9   4. History of CVA (cerebrovascular accident) V12.54 Z86.73   5. PBA (pseudobulbar affect) 310.81 F48.2   6. Essential hypertension 401.9 I10     Check CBC, BMP  T/c neurology eval for sz mx  T/c urology eval for chronic microscopic hematuria  Cont current meds as ordered  PT/OT/St as ordered  Nutritional supplements as indicated  GOAL: short term rehab and d/c home when medically appropriate. Communicated with pt and nursing.  Will follow  Cortney Mckinney S. Perlie Gold  [facility] [address] [place], [place] [address] [phone] Cell (Monday-Friday 8 AM - 5 PM) [phone] After 5 PM and follow prompts

## 2016-01-11 DIAGNOSIS — F482 Pseudobulbar affect: Secondary | ICD-10-CM | POA: Diagnosis not present

## 2016-01-11 DIAGNOSIS — G309 Alzheimer's disease, unspecified: Secondary | ICD-10-CM | POA: Diagnosis not present

## 2016-01-11 DIAGNOSIS — F329 Major depressive disorder, single episode, unspecified: Secondary | ICD-10-CM | POA: Diagnosis not present

## 2016-01-14 ENCOUNTER — Ambulatory Visit: Payer: Medicare Other | Admitting: Neurology

## 2016-01-15 DIAGNOSIS — N200 Calculus of kidney: Secondary | ICD-10-CM | POA: Diagnosis not present

## 2016-01-29 ENCOUNTER — Non-Acute Institutional Stay (SKILLED_NURSING_FACILITY): Payer: Medicare Other | Admitting: Adult Health

## 2016-01-29 ENCOUNTER — Encounter: Payer: Self-pay | Admitting: Adult Health

## 2016-01-29 DIAGNOSIS — I351 Nonrheumatic aortic (valve) insufficiency: Secondary | ICD-10-CM

## 2016-01-29 DIAGNOSIS — F32A Depression, unspecified: Secondary | ICD-10-CM

## 2016-01-29 DIAGNOSIS — F329 Major depressive disorder, single episode, unspecified: Secondary | ICD-10-CM | POA: Diagnosis not present

## 2016-01-29 DIAGNOSIS — G309 Alzheimer's disease, unspecified: Secondary | ICD-10-CM | POA: Diagnosis not present

## 2016-01-29 DIAGNOSIS — N3281 Overactive bladder: Secondary | ICD-10-CM | POA: Diagnosis not present

## 2016-01-29 DIAGNOSIS — R569 Unspecified convulsions: Secondary | ICD-10-CM | POA: Diagnosis not present

## 2016-01-29 DIAGNOSIS — Z8673 Personal history of transient ischemic attack (TIA), and cerebral infarction without residual deficits: Secondary | ICD-10-CM | POA: Diagnosis not present

## 2016-01-29 DIAGNOSIS — F482 Pseudobulbar affect: Secondary | ICD-10-CM

## 2016-01-29 DIAGNOSIS — F0280 Dementia in other diseases classified elsewhere without behavioral disturbance: Secondary | ICD-10-CM

## 2016-01-29 DIAGNOSIS — I10 Essential (primary) hypertension: Secondary | ICD-10-CM

## 2016-01-29 NOTE — Progress Notes (Signed)
Patient ID: Mike Williams, male   DOB: [birth year], 82 y.o.   MRN: [id number]   Location:   [facility] Room Number: 107-A Place of Service:  SNF (31)   CODE STATUS: Full Code  No Known Allergies  Chief Complaint  Patient presents with  . Medical Management of Chronic Issues    Follow up    HPI:  He is a long term resident of this facility being seen for the management of his chronic illnesses. Overall his status is without significant change. He is unable to fully participate in the hpi or ros. There are no nursing concerns at this time.    Past Medical History:  Diagnosis Date  . Aortic insufficiency 07/04/2011   2/13 Study Conclusions  - Left ventricle: Systolic function was normal. The estimated ejection fraction was in the range of 55% to 60%. - Aortic valve: Moderate regurgitation. - Aorta: The aorta was moderately dilated. Transthoracic echocardiography. M-mode, complete 2D, spectral Doppler, and color Doppler. Height: Height: 170.2cm. Height: 67in. Weight: Weight: 84.1kg. Weight: 185lb. Body mass index: BMI: 29kg/m^2. Body surface area: BSA: 2.35m^2. Blood pressure: 175/86. Patient status: Inpatient. Location: Bedside.      . Aortic root dilatation (HCC)    Noted on 09/2010 echo, with mild-mod AI  . CEREBROVASCULAR ACCIDENT, HX OF 02/09/2007  . DEMENTIA 05/12/2007  . Hemorrhoids   . HYPERGLYCEMIA 09/21/2009  . HYPERTENSION 12/29/2006  . Lactose intolerance   . OBSTRUCTIVE SLEEP APNEA 12/29/2006  . PROSTATE CANCER, HX OF 12/29/2006  . Prostatitis   . Risk for falls   . Seizures ([place])     Past Surgical History:  Procedure Laterality Date  . CHOLECYSTECTOMY    . GANGLION CYST EXCISION    . PROSTATE SURGERY     cryotherapy    Social History   Social History  . Marital status: Married    Spouse name: N/A  . Number of children: 2  . Years of education: master's   Occupational History  . retired    Social History Main Topics  . Smoking status: Never Smoker    . Smokeless tobacco: Never Used  . Alcohol use No  . Drug use: No  . Sexual activity: Not on file   Other Topics Concern  . Not on file   Social History Narrative   Married 52 years in 2016 to wife [name] (goes to [place] urgent care). 2 grown sons. 2 grandkids-boy and girl.       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       Patient does not drink caffeine.   Patient is right handed.   Family History  Problem Relation Age of Onset  . Heart attack Father   . Heart disease Father   . Cancer Sister       VITAL SIGNS BP (!) 150/79   Pulse (!) 59   Temp (!) 95.5 F (35.3 C) (Oral)   Resp 19   Ht 5\' 7"  (1.702 m)   Wt 148 lb 6 oz (67.3 kg)   SpO2 99%   BMI 23.24 kg/m   Patient's Medications  New Prescriptions   No medications on file  Previous Medications   BENICAR 20 MG TABLET    take 1 tablet by mouth once daily   CLOPIDOGREL (PLAVIX) 75 MG TABLET    take 1 tablet by mouth once daily   DARIFENACIN (ENABLEX) 7.5 MG 24 HR TABLET    Take 1 tablet (7.5 mg total) by mouth daily.  DEXTROMETHORPHAN-QUINIDINE (NUEDEXTA) 20-10 MG CAPS    Take 1 capsule by mouth 2 (two) times daily.   ESCITALOPRAM (LEXAPRO) 5 MG TABLET    Take 5 mg by mouth daily. For depression   FELODIPINE (PLENDIL) 10 MG 24 HR TABLET    Take 10 mg by mouth daily.   HYDRALAZINE (APRESOLINE) 50 MG TABLET    take 1 tablet by mouth three times a day   LEVETIRACETAM (KEPPRA) 250 MG TABLET    Take 1 tablet (250 mg total) by mouth 2 (two) times daily.   SKIN PROTECTANTS, MISC. (CALAZIME SKIN PROTECTANT) PSTE    Apply 1 application topically 2 (two) times daily.  Modified Medications   No medications on file  Discontinued Medications   No medications on file     SIGNIFICANT DIAGNOSTIC EXAMS  LABS REVIEWED:   07-05-15: wbc 6.6; hgb 13.8; hct 41.3; mcv 85.2; plt 236; glucose 86; bun 13.4; creat 0.92; k+ 4.1; na++144; liver normal albumin 3.5  12-29-15: wbc 4.7; hgb 10.9; hct 31.9; mcv 82.5; plt 182;  glucose 82; bun 18.8; creat 0.97; k+ 3.8; na++ 147     Review of Systems  Unable to perform ROS: dementia    Physical Exam  Constitutional: No distress.  Eyes: Conjunctivae are normal.  Neck: Neck supple. No JVD present. No thyromegaly present.  Cardiovascular: Normal rate, regular rhythm and intact distal pulses.   Respiratory: Effort normal and breath sounds normal. No respiratory distress. He has no wheezes.  GI: Soft. Bowel sounds are normal. He exhibits no distension. There is no tenderness.  Musculoskeletal: He exhibits no edema.  Able to move all extremities   Lymphadenopathy:    He has no cervical adenopathy.  Neurological: He is alert.  Skin: Skin is warm and dry. He is not diaphoretic.  Psychiatric: He has a normal mood and affect.     ASSESSMENT/ PLAN:  1. Hypertension: has aortic insufficiency   will continue benicar 20 mg daily; apresoline  50 mg three times daily  plendil  10 mg daily   2. Seizure: no reports of seizure present will continue keppra 250 mg twice daily   3. UI: will continue enablex 7.5 mg daily  4. CVA: is neurologically stable will continue plavix 75 mg daily   5. Alzheimer's disease: is without significant change in status; is presently not on medications; will not make changes at this time;his current weight is 148 pounds.  will monitor   6. PBA: will continue nuedexta 20-10 mg twice daily and will monitor   7. Depression: will continue lexapro 5 mg daily        Ok Edwards NP [facility]  Contact [phone] Monday through Friday 8am- 5pm  After hours call [phone]

## 2016-01-31 ENCOUNTER — Ambulatory Visit (INDEPENDENT_AMBULATORY_CARE_PROVIDER_SITE_OTHER): Payer: Medicare Other | Admitting: Neurology

## 2016-01-31 ENCOUNTER — Encounter: Payer: Self-pay | Admitting: Neurology

## 2016-01-31 VITALS — BP 142/70 | HR 60 | Ht 67.0 in | Wt 148.0 lb

## 2016-01-31 DIAGNOSIS — G309 Alzheimer's disease, unspecified: Secondary | ICD-10-CM

## 2016-01-31 DIAGNOSIS — R569 Unspecified convulsions: Secondary | ICD-10-CM

## 2016-01-31 DIAGNOSIS — F0280 Dementia in other diseases classified elsewhere without behavioral disturbance: Secondary | ICD-10-CM

## 2016-01-31 NOTE — Progress Notes (Signed)
Reason for visit: Alzheimer's disease  Mike Williams is an 82 y.o. male  History of present illness:  Mike Williams is an 82 year old right-handed black male with a history of end-stage Alzheimer's disease. The patient is residing in an extended care facility currently. The patient comes in today with a driver, he does not know anything about the patient. The patient was in the hospital on 12/23/2015. The patient was admitted with sudden onset of altered mental status. Workup revealed a possible urinary tract infection, the possibility of a seizure was entertained. An EEG study done was unremarkable, CT the head showed no acute changes. The patient returned to his baseline. He has been treated with Keppra 250 mg twice daily, this dose was not altered. The patient is not on any medications for memory at this time, he is sent to this office for a follow-up following the previous hospitalization. He has had admissions in the past associated with altered mental status with bladder infections.  Past Medical History:  Diagnosis Date  . Aortic insufficiency 07/04/2011   2/13 Study Conclusions  - Left ventricle: Systolic function was normal. The estimated ejection fraction was in the range of 55% to 60%. - Aortic valve: Moderate regurgitation. - Aorta: The aorta was moderately dilated. Transthoracic echocardiography. M-mode, complete 2D, spectral Doppler, and color Doppler. Height: Height: 170.2cm. Height: 67in. Weight: Weight: 84.1kg. Weight: 185lb. Body mass index: BMI: 29kg/m^2. Body surface area: BSA: 2.37m^2. Blood pressure: 175/86. Patient status: Inpatient. Location: Bedside.      . Aortic root dilatation (HCC)    Noted on 09/2010 echo, with mild-mod AI  . CEREBROVASCULAR ACCIDENT, HX OF 02/09/2007  . DEMENTIA 05/12/2007  . Hemorrhoids   . HYPERGLYCEMIA 09/21/2009  . HYPERTENSION 12/29/2006  . Lactose intolerance   . OBSTRUCTIVE SLEEP APNEA 12/29/2006  . PROSTATE CANCER, HX OF 12/29/2006  . Prostatitis     . Risk for falls   . Seizures ([place])     Past Surgical History:  Procedure Laterality Date  . CHOLECYSTECTOMY    . GANGLION CYST EXCISION    . PROSTATE SURGERY     cryotherapy    Family History  Problem Relation Age of Onset  . Heart attack Father   . Heart disease Father   . Cancer Sister     Social history:  reports that he has never smoked. He has never used smokeless tobacco. He reports that he does not drink alcohol or use drugs.   No Known Allergies  Medications:  Prior to Admission medications   Medication Sig Start Date End Date Taking? Authorizing Provider  BENICAR 20 MG tablet take 1 tablet by mouth once daily 04/14/15  Yes Marin Olp, MD  clopidogrel (PLAVIX) 75 MG tablet take 1 tablet by mouth once daily 11/28/14  Yes Marin Olp, MD  darifenacin (ENABLEX) 7.5 MG 24 hr tablet Take 1 tablet (7.5 mg total) by mouth daily. 06/29/14  Yes Marin Olp, MD  Dextromethorphan-Quinidine (NUEDEXTA) 20-10 MG CAPS Take 1 capsule by mouth 2 (two) times daily.   Yes Historical Provider, MD  escitalopram (LEXAPRO) 5 MG tablet Take 5 mg by mouth daily. For depression   Yes Historical Provider, MD  felodipine (PLENDIL) 10 MG 24 hr tablet Take 10 mg by mouth daily.   Yes Historical Provider, MD  hydrALAZINE (APRESOLINE) 50 MG tablet take 1 tablet by mouth three times a day 01/30/15  Yes Marin Olp, MD  levETIRAcetam (KEPPRA) 250 MG tablet Take 1 tablet (  250 mg total) by mouth 2 (two) times daily. 06/21/15  Yes Marin Olp, MD  Skin Protectants, Misc. (CALAZIME SKIN PROTECTANT) PSTE Apply 1 application topically 2 (two) times daily.   Yes Historical Provider, MD    ROS:  Out of a complete 14 system review of symptoms, the patient complains only of the following symptoms, and all other reviewed systems are negative.  Memory disorder Seizures  Blood pressure (!) 142/70, pulse 60, height 5\' 7"  (1.702 m), weight 148 lb (67.1 kg).  Physical Exam  General:  The patient is alert and cooperative at the time of the examination.  Skin: No significant peripheral edema is noted.   Neurologic Exam  Mental status: The patient is nonverbal, he will not state his name, he cannot indicate where he is or the date.   Cranial nerves: Facial symmetry is present. Speech cannot be evaluated, the patient is nonverbal Extraocular movements are full. Visual fields are full to threat.  Motor: The patient has good strength in all 4 extremities.  Sensory examination: The patient response to pain stimulation on all 4 extremities.  Coordination: The patient has good finger-nose-finger and heel-to-shin bilaterally. The patient has some apraxia with the use of the extremities.  Gait and station: The patient is able to stand with some assistance, he can walk with assistance, gait is wide-based, unsteady.  Reflexes: Deep tendon reflexes are symmetric.   CT head 12/23/15:  IMPRESSION: 1. Global atrophy with extensive chronic microvascular ischemic change.  No acute intracranial findings.  Grossly stable from priors.  * CT scan images were reviewed online. I agree with the written report.    Assessment/Plan:  1. Alzheimer's disease, end-stage  2. History seizures, possible recent recurrence  The patient will remain on the low-dose Keppra, taking 250 mg twice daily. The episode of altered mental status may have been associated with a urinary tract infection, the onset of the episode was not witnessed. The patient will follow-up through this office if needed. I will not change any of the medical therapy.  Jill Alexanders MD 01/31/2016 11:19 AM  [facility] [address] [place], [place] [address]  Phone [phone] Fax [phone]

## 2016-02-19 ENCOUNTER — Non-Acute Institutional Stay (SKILLED_NURSING_FACILITY): Payer: Medicare Other | Admitting: Internal Medicine

## 2016-02-19 DIAGNOSIS — R21 Rash and other nonspecific skin eruption: Secondary | ICD-10-CM

## 2016-02-19 DIAGNOSIS — B370 Candidal stomatitis: Secondary | ICD-10-CM

## 2016-02-19 DIAGNOSIS — I10 Essential (primary) hypertension: Secondary | ICD-10-CM | POA: Diagnosis not present

## 2016-02-19 DIAGNOSIS — E870 Hyperosmolality and hypernatremia: Secondary | ICD-10-CM | POA: Diagnosis not present

## 2016-02-19 NOTE — Progress Notes (Signed)
This is an acute visit.  Level care skilled.  [place]  Chief complaint-acute visit secondary to groin rash  History of present illness.  Patient is an 82 year old male who nursing staff has left a note stating he has a groin rash on his left.  Patient is a poor historian secondary to dementia and cannot really give any review of systems however he says it does not itch.  I do note per chart review he also has some elevated systolics it's listed as 0000000 I did confirm this taking his blood pressure manually previous blood pressure systolically appear to run from the 150-to 180s generally.  He does have a history of hypertension with aortic insufficiency he is on Benicar 20 mg a day hydralazine 50 mg 3 times a day and Plendil 10 mg a day.     Past Medical History:  Diagnosis Date  . Aortic insufficiency 07/04/2011   2/13 Study Conclusions  - Left ventricle: Systolic function was normal. The estimated ejection fraction was in the range of 55% to 60%. - Aortic valve: Moderate regurgitation. - Aorta: The aorta was moderately dilated. Transthoracic echocardiography. M-mode, complete 2D, spectral Doppler, and color Doppler. Height: Height: 170.2cm. Height: 67in. Weight: Weight: 84.1kg. Weight: 185lb. Body mass index: BMI: 29kg/m^2. Body surface area: BSA: 2.68m^2. Blood pressure: 175/86. Patient status: Inpatient. Location: Bedside.      . Aortic root dilatation (HCC)    Noted on 09/2010 echo, with mild-mod AI  . CEREBROVASCULAR ACCIDENT, HX OF 02/09/2007  . DEMENTIA 05/12/2007  . Hemorrhoids   . HYPERGLYCEMIA 09/21/2009  . HYPERTENSION 12/29/2006  . Lactose intolerance   . OBSTRUCTIVE SLEEP APNEA 12/29/2006  . PROSTATE CANCER, HX OF 12/29/2006  . Prostatitis   . Risk for falls   . Seizures ([place])          Past Surgical History:  Procedure Laterality Date  . CHOLECYSTECTOMY    . GANGLION CYST EXCISION    . PROSTATE SURGERY     cryotherapy     Social History        Social History  . Marital status: Married    Spouse name: N/A  . Number of children: 2  . Years of education: master's       Occupational History  . retired        Social History Main Topics  . Smoking status: Never Smoker  . Smokeless tobacco: Never Used  . Alcohol use No  . Drug use: No  . Sexual activity: Not on file       Other Topics Concern  . Not on file      Social History Narrative   Married 52 years in 2016 to wife [name] (goes to [place] urgent care). 2 grown sons. 2 grandkids-boy and girl.       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       Patient does not drink caffeine.   Patient is right handed.        Family History  Problem Relation Age of Onset  . Heart attack Father   . Heart disease Father   . Cancer Sister       VITAL SIGNS Temperature is 97.0 pulse of 58 respirations 18 blood pressure taken manually was 180/90 again the machine got 179/87.         Patient's Medications  New Prescriptions   No medications on file  Previous Medications   BENICAR 20 MG TABLET    take 1 tablet  by mouth once daily   CLOPIDOGREL (PLAVIX) 75 MG TABLET    take 1 tablet by mouth once daily   DARIFENACIN (ENABLEX) 7.5 MG 24 HR TABLET    Take 1 tablet (7.5 mg total) by mouth daily.   DEXTROMETHORPHAN-QUINIDINE (NUEDEXTA) 20-10 MG CAPS    Take 1 capsule by mouth 2 (two) times daily.   ESCITALOPRAM (LEXAPRO) 5 MG TABLET    Take 5 mg by mouth daily. For depression   FELODIPINE (PLENDIL) 10 MG 24 HR TABLET    Take 10 mg by mouth daily.   HYDRALAZINE (APRESOLINE) 50 MG TABLET    take 1 tablet by mouth three times a day   LEVETIRACETAM (KEPPRA) 250 MG TABLET    Take 1 tablet (250 mg total) by mouth 2 (two) times daily.   SKIN PROTECTANTS, MISC. (CALAZIME SKIN PROTECTANT) PSTE    Apply 1 application topically 2 (two) times daily.  Modified Medications   No medications on file  Discontinued  Medications   No medications on file     SIGNIFICANT DIAGNOSTIC EXAMS  LABS REVIEWED:   07-05-15: wbc 6.6; hgb 13.8; hct 41.3; mcv 85.2; plt 236; glucose 86; bun 13.4; creat 0.92; k+ 4.1; na++144; liver normal albumin 3.5  12-29-15: wbc 4.7; hgb 10.9; hct 31.9; mcv 82.5; plt 182; glucose 82; bun 18.8; creat 0.97; k+ 3.8; na++ 147     Review of Systems  Unable to perform ROS: dementia-Please see history of present illness     Physical Exam  Constitutional: No distress. Resting comfortably in bed Eyes: Conjunctivae are normal.  Neck: Neck supple. No JVD present. No thyromegaly present.  Oropharynx he appears to have some dried residue whitish appearing on tongue mucous membranes appear fairly moist Cardiovascular: Normal rate, regular rhythm and intact distal pulses.   slightly bradycardic Respiratory: Effort normal and breath sounds normal. No respiratory distress. He has no wheezes.  GI: Soft. Bowel sounds are normal. He exhibits no distension. There is no tenderness.  Musculoskeletal: He exhibits no edema.  Able to move all extremities     Neurological: He is alert.  Skin: Skin is warm and dry. He is not diaphoretic. Left groin area appears to have some pinpoint erythematous crusted areas somewhat diffuse in the left groin area I do not see it elsewhere.    Psychiatric: He has a normal mood and affect.    Assessment and plan.  #1-dermatitis-suspect this is some type of contact dermatitis will start triamcinolone cream twice a day until resolution notify provider if no resolution.  [id number] systolics appear to be somewhat elevated we will increase Benicar to 40 mg a day and continue blood pressure checks twice a day-continues as well as on hydralazine 50 mg 3 times a day as well as Plendil 10 mg a day.--Log for provider review next week  #3-mouth residue-will treat with Magic mouthwash 5 mL 4 times a day 5 days monitor for resolution.  #4  hypernatremia I note sodium was [id number]  recent metabolic panel in late August will update this to ensure stability  Also will update a CBC appears hemoglobin was 10.9 on lab in late August which appears somewhat lower than hemoglobin earlier this year  [phone]

## 2016-02-20 LAB — CBC AND DIFFERENTIAL
HEMATOCRIT: 37 % — AB (ref 41–53)
HEMOGLOBIN: 11.9 g/dL — AB (ref 13.5–17.5)
PLATELETS: 212 10*3/uL (ref 150–399)
WBC: 6.1 10*3/mL

## 2016-02-22 ENCOUNTER — Encounter: Payer: Self-pay | Admitting: Adult Health

## 2016-02-22 ENCOUNTER — Non-Acute Institutional Stay (SKILLED_NURSING_FACILITY): Payer: Medicare Other | Admitting: Adult Health

## 2016-02-22 DIAGNOSIS — F482 Pseudobulbar affect: Secondary | ICD-10-CM | POA: Diagnosis not present

## 2016-02-22 DIAGNOSIS — N3281 Overactive bladder: Secondary | ICD-10-CM

## 2016-02-22 DIAGNOSIS — G301 Alzheimer's disease with late onset: Secondary | ICD-10-CM | POA: Diagnosis not present

## 2016-02-22 DIAGNOSIS — R569 Unspecified convulsions: Secondary | ICD-10-CM | POA: Diagnosis not present

## 2016-02-22 DIAGNOSIS — Z8673 Personal history of transient ischemic attack (TIA), and cerebral infarction without residual deficits: Secondary | ICD-10-CM

## 2016-02-22 DIAGNOSIS — F329 Major depressive disorder, single episode, unspecified: Secondary | ICD-10-CM | POA: Diagnosis not present

## 2016-02-22 DIAGNOSIS — F0280 Dementia in other diseases classified elsewhere without behavioral disturbance: Secondary | ICD-10-CM | POA: Diagnosis not present

## 2016-02-22 DIAGNOSIS — F32A Depression, unspecified: Secondary | ICD-10-CM

## 2016-02-22 DIAGNOSIS — I10 Essential (primary) hypertension: Secondary | ICD-10-CM

## 2016-02-22 NOTE — Progress Notes (Signed)
Patient ID: Mike Williams, male   DOB: 1932-06-26, 82 y.o.   MRN: [id number]   Location:   [facility] Room Number: 107-A Place of Service:  SNF (31)   CODE STATUS: Full Code  No Known Allergies  Chief Complaint  Patient presents with  . Medical Management of Chronic Issues    Follow up    HPI:  He is a long term resident of this facility being seen for the management of his chronic illnesses. Overall there is little change in his status. He is unable to fully participate in the hpi or ros. There are no nursing concerns at this time.   Past Medical History:  Diagnosis Date  . Aortic insufficiency 07/04/2011   2/13 Study Conclusions  - Left ventricle: Systolic function was normal. The estimated ejection fraction was in the range of 55% to 60%. - Aortic valve: Moderate regurgitation. - Aorta: The aorta was moderately dilated. Transthoracic echocardiography. M-mode, complete 2D, spectral Doppler, and color Doppler. Height: Height: 170.2cm. Height: 67in. Weight: Weight: 84.1kg. Weight: 185lb. Body mass index: BMI: 29kg/m^2. Body surface area: BSA: 2.28m^2. Blood pressure: 175/86. Patient status: Inpatient. Location: Bedside.      . Aortic root dilatation (HCC)    Noted on 09/2010 echo, with mild-mod AI  . CEREBROVASCULAR ACCIDENT, HX OF 02/09/2007  . DEMENTIA 05/12/2007  . Hemorrhoids   . HYPERGLYCEMIA 09/21/2009  . HYPERTENSION 12/29/2006  . Lactose intolerance   . OBSTRUCTIVE SLEEP APNEA 12/29/2006  . PROSTATE CANCER, HX OF 12/29/2006  . Prostatitis   . Risk for falls   . Seizures ([place])     Past Surgical History:  Procedure Laterality Date  . CHOLECYSTECTOMY    . GANGLION CYST EXCISION    . PROSTATE SURGERY     cryotherapy    Social History   Social History  . Marital status: Married    Spouse name: N/A  . Number of children: 2  . Years of education: master's   Occupational History  . retired    Social History Main Topics  . Smoking status: Never Smoker  .  Smokeless tobacco: Never Used  . Alcohol use No  . Drug use: No  . Sexual activity: Not on file   Other Topics Concern  . Not on file   Social History Narrative   Married 52 years in 2016 to wife [name] (goes to [place] urgent care). 2 grown sons. 2 grandkids-boy and girl.       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       Patient does not drink caffeine.   Patient is right handed.   Family History  Problem Relation Age of Onset  . Heart attack Father   . Heart disease Father   . Cancer Sister       VITAL SIGNS BP (!) 146/73   Pulse 68   Temp 97.8 F (36.6 C) (Oral)   Resp 18   Ht 5\' 7"  (1.702 m)   Wt 142 lb (64.4 kg)   SpO2 98%   BMI 22.24 kg/m   Patient's Medications  New Prescriptions   No medications on file  Previous Medications   BENICAR 20 MG TABLET    take 1 tablet by mouth once daily   CLOPIDOGREL (PLAVIX) 75 MG TABLET    take 1 tablet by mouth once daily   DARIFENACIN (ENABLEX) 7.5 MG 24 HR TABLET    Take 1 tablet (7.5 mg total) by mouth daily.   DEXTROMETHORPHAN-QUINIDINE (NUEDEXTA) 20-10 MG  CAPS    Take 1 capsule by mouth 2 (two) times daily.   ESCITALOPRAM (LEXAPRO) 5 MG TABLET    Take 5 mg by mouth daily. For depression   FELODIPINE (PLENDIL) 10 MG 24 HR TABLET    Take 10 mg by mouth daily.   HYDRALAZINE (APRESOLINE) 50 MG TABLET    take 1 tablet by mouth three times a day   LEVETIRACETAM (KEPPRA) 250 MG TABLET    Take 1 tablet (250 mg total) by mouth 2 (two) times daily.   SKIN PROTECTANTS, MISC. (CALAZIME SKIN PROTECTANT) PSTE    Apply 1 application topically 2 (two) times daily.  Modified Medications   No medications on file  Discontinued Medications   No medications on file     SIGNIFICANT DIAGNOSTIC EXAMS  12-23-15: ct of head: 1. Global atrophy with extensive chronic microvascular ischemic change. No acute intracranial findings.  Grossly stable from priors. 2. ASPECTS is 10.  12-24-15: EEG: Impression: This awake and drowsy EEG  is normal.   Clinical Correlation: A normal EEG does not exclude a clinical diagnosis of epilepsy.  If further clinical questions remain, prolonged EEG may be helpful.  Clinical correlation is advised.     LABS REVIEWED:   07-05-15: wbc 6.6; hgb 13.8; hct 41.3; mcv 85.2; plt 236; glucose 86; bun 13.4; creat 0.92; k+ 4.1; na++144; liver normal albumin 3.5  12-29-15: wbc 4.7; hgb 10.9; hct 31.9; mcv 82.5; plt 182; glucose 82; bun 18.8; creat 0.97; k+ 3.8; na++ 147     Review of Systems  Unable to perform ROS: dementia    Physical Exam  Constitutional: No distress.  Eyes: Conjunctivae are normal.  Neck: Neck supple. No JVD present. No thyromegaly present.  Cardiovascular: Normal rate, regular rhythm and intact distal pulses.   Respiratory: Effort normal and breath sounds normal. No respiratory distress. He has no wheezes.  GI: Soft. Bowel sounds are normal. He exhibits no distension. There is no tenderness.  Musculoskeletal: He exhibits no edema.  Able to move all extremities   Lymphadenopathy:    He has no cervical adenopathy.  Neurological: He is alert.  Skin: Skin is warm and dry. He is not diaphoretic.  Psychiatric: He has a normal mood and affect.     ASSESSMENT/ PLAN:  1. Hypertension: has aortic insufficiency   will continue benicar 20 mg daily; apresoline  50 mg three times daily  plendil  10 mg daily   2. Seizure: no reports of seizure present will continue keppra 250 mg twice daily   3. UI: will continue enablex 7.5 mg daily  4. CVA: is neurologically stable will continue plavix 75 mg daily   5. Alzheimer's disease: is without significant change in status; is presently not on medications; will not make changes at this time;his current weight is 142 pounds.  will monitor   6. PBA: will continue nuedexta 20-10 mg twice daily and will monitor   7. Depression: will continue lexapro 5 mg daily      Ok Edwards NP [facility]  Contact [phone]  Monday through Friday 8am- 5pm  After hours call [phone]

## 2016-02-25 LAB — BASIC METABOLIC PANEL
BUN: 20 mg/dL (ref 4–21)
Creatinine: 1.2 mg/dL (ref 0.6–1.3)
Glucose: 100 mg/dL
Potassium: 4.1 mmol/L (ref 3.4–5.3)
Sodium: 154 mmol/L — AB (ref 137–147)

## 2016-02-27 LAB — BASIC METABOLIC PANEL
BUN: 17 mg/dL (ref 4–21)
CREATININE: 1.1 mg/dL (ref 0.6–1.3)
GLUCOSE: 82 mg/dL
POTASSIUM: 3.7 mmol/L (ref 3.4–5.3)
SODIUM: 152 mmol/L — AB (ref 137–147)

## 2016-03-18 ENCOUNTER — Encounter: Payer: Self-pay | Admitting: Internal Medicine

## 2016-03-18 ENCOUNTER — Non-Acute Institutional Stay (SKILLED_NURSING_FACILITY): Payer: Medicare Other | Admitting: Internal Medicine

## 2016-03-18 DIAGNOSIS — I10 Essential (primary) hypertension: Secondary | ICD-10-CM | POA: Diagnosis not present

## 2016-03-18 DIAGNOSIS — F0280 Dementia in other diseases classified elsewhere without behavioral disturbance: Secondary | ICD-10-CM

## 2016-03-18 DIAGNOSIS — R569 Unspecified convulsions: Secondary | ICD-10-CM | POA: Diagnosis not present

## 2016-03-18 DIAGNOSIS — F482 Pseudobulbar affect: Secondary | ICD-10-CM

## 2016-03-18 DIAGNOSIS — G301 Alzheimer's disease with late onset: Secondary | ICD-10-CM

## 2016-03-18 NOTE — Progress Notes (Signed)
This is a routine visit.  Level of care skilled.  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  History of present illness.  Patient is a pleasant 82 year old male seen today for follow-up of chronic medical issues as noted above.  He continues to have a period of stability-nursing staff does not report any concerns-he appears to ambulate about the facility in his wheelchair-nursing staff has not reported any behavior systems weight appears to be stable at 142 pounds.  She does have a history of hypertension has variable blood pressures ranging mainly systolically between [id number] and 160-diastolic varies from the [id number] the 90s-we have been somewhat conservative here managing this secondary to concerns of hypotension and fall risk.  He is on numerous medications including Benicar 40 mg a day-Felodipine 10 mg a day as well as hydralazine 50 mg 3 times a day.  Regards to dementia this appears to be fairly significant but he appears to be doing well with supportive care nursing staff does not report any issues.  Does have a history of coexistent depression is on low-dose Lexapro.  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    Past M abedical History:  Diagnosis Date  . Aortic insufficiency 07/04/2011   2/13 Study Conclusions  - Left ventricle: Systolic function was normal. The estimated ejection fraction was in the range of 55% to 60%. - Aortic valve: Moderate regurgitation. - Aorta: The aorta was moderately dilated. Transthoracic echocardiography. M-mode, complete 2D, spectral Doppler, and color Doppler. Height: Height: 170.2cm. Height: 67in. Weight: Weight: 84.1kg. Weight: 185lb. Body mass index: BMI: 29kg/m^2. Body surface area: BSA: 2.35m^2. Blood pressure: 175/86. Patient status: Inpatient. Location:  Bedside.      . Aortic root dilatation (HCC)    Noted on 09/2010 echo, with mild-mod AI  . CEREBROVASCULAR ACCIDENT, HX OF 02/09/2007  . DEMENTIA 05/12/2007  . Hemorrhoids   . HYPERGLYCEMIA 09/21/2009  . HYPERTENSION 12/29/2006  . Lactose intolerance   . OBSTRUCTIVE SLEEP APNEA 12/29/2006  . PROSTATE CANCER, HX OF 12/29/2006  . Prostatitis   . Risk for falls   . Seizures ([place])          Past Surgical History:  Procedure Laterality Date  . CHOLECYSTECTOMY    . GANGLION CYST EXCISION    . PROSTATE SURGERY     cryotherapy    Social History        Social History  . Marital status: Married    Spouse name: N/A  . Number of children: 2  . Years of education: master's       Occupational History  . retired        Social History Main Topics  . Smoking status: Never Smoker  . Smokeless tobacco: Never Used  . Alcohol use No  . Drug use: No  . Sexual activity: Not on file       Other Topics Concern  . Not on file      Social History Narrative   Married 52 years in 2016 to wife [name] (goes to [place] urgent care). 2 grown sons. 2 grandkids-boy and girl.       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       Patient does not drink caffeine.   Patient is right handed.        Family History  Problem Relation Age of Onset  . Heart attack Father   . Heart  disease Father   . Cancer Sister       VITAL SIGNS  Temperature is 98.2 pulse 64 respirations 17 blood pressure 148/70 his weight is stable at 142.5        Patient's Medications  New Prescriptions   No medications on file  Previous Medications   BENICAR 20 MG TABLET    take 1 tablet by mouth once daily   CLOPIDOGREL (PLAVIX) 75 MG TABLET    take 1 tablet by mouth once daily   DARIFENACIN (ENABLEX) 7.5 MG 24 HR TABLET    Take 1 tablet (7.5 mg total) by mouth daily.   DEXTROMETHORPHAN-QUINIDINE (NUEDEXTA) 20-10 MG CAPS    Take 1 capsule by mouth 2 (two) times  daily.   ESCITALOPRAM (LEXAPRO) 5 MG TABLET    Take 5 mg by mouth daily. For depression   FELODIPINE (PLENDIL) 10 MG 24 HR TABLET    Take 10 mg by mouth daily.   HYDRALAZINE (APRESOLINE) 50 MG TABLET    take 1 tablet by mouth three times a day   LEVETIRACETAM (KEPPRA) 250 MG TABLET    Take 1 tablet (250 mg total) by mouth 2 (two) times daily.   SKIN PROTECTANTS, MISC. (CALAZIME SKIN PROTECTANT) PSTE    Apply 1 application topically 2 (two) times daily.  Modified Medications   No medications on file  Discontinued Medications   No medications on file     SIGNIFICANT DIAGNOSTIC EXAMS  12-23-15: ct of head: 1. Global atrophy with extensive chronic microvascular ischemic change. No acute intracranial findings. Grossly stable from priors. 2. ASPECTS is 10.  12-24-15: EEG: Impression: This awake and drowsyEEG is normal.  Clinical Correlation: A normal EEG does not exclude a clinical diagnosis of epilepsy. If further clinical questions remain, prolonged EEG may be helpful. Clinical correlation is advised.    LABS REVIEWED:   07-05-15: wbc 6.6; hgb 13.8; hct 41.3; mcv 85.2; plt 236; glucose 86; bun 13.4; creat 0.92; k+ 4.1; na++144; liver normal albumin 3.5  12-29-15: wbc 4.7; hgb 10.9; hct 31.9; mcv 82.5; plt 182; glucose 82; bun 18.8; creat 0.97; k+ 3.8; na++ 147     Review of Systems  Unable to perform ROS: dementia  please see history of present illness    Physical Exam  Constitutional: No distress.  Eyes: Conjunctivae are normal.  Neck: Neck supple. No JVD present. No thyromegaly present.  Cardiovascular: Normal rate, regular rhythm and intact distal pulses.   Respiratory: Effort normal and breath sounds normal. No respiratory distress. He has no wheezes.  GI: Soft. Bowel sounds are normal. He exhibits no distension. There is no tenderness.  Musculoskeletal: He exhibits no edema.  Able to move all extremities ambulates largely in wheelchair--- transfers  himself    Lymphadenopathy:    He has no cervical adenopathy.  Neurological: He is alert.  Skin: Skin is warm and dry. He is not diaphoretic.  Psychiatric: He has a normal mood and affect. he is pleasant and follows simple verbal commands     ASSESSMENT/ PLAN:  1. Hypertension: has aortic insufficiency   will continue benicar 20 mg daily; apresoline  50 mg three times daily  plendil  10 mg daily  again has somewhat elevated systolics at times but secondary to fall risk -- conservative here   2. Seizure: no reports of seizure present will continue keppra 250 mg twice daily   3. UI: will continue enablex 7.5 mg daily  4. CVA: is neurologically stable will continue plavix 75  mg daily   5. Alzheimer's disease: is without significant change in status; is presently not on medications; will not make changes at this time;his current weight is 142 pounds.   which is stable  6. PBA: will continue nuedexta 20-10 mg twice daily and will monitor   7. Depression: will continue lexapro 5 mg daily   8--anemia last hemoglobin was 10.9 appears this is down somewhat from level back in March will update this.  #9 history of mild hypernatremia last sodium was 147 on lab done in late August will update this as well.  [id number]

## 2016-03-19 LAB — BASIC METABOLIC PANEL
BUN: 20 mg/dL (ref 4–21)
CREATININE: 0.9 mg/dL (ref 0.6–1.3)
Glucose: 78 mg/dL
POTASSIUM: 4.1 mmol/L (ref 3.4–5.3)
SODIUM: 148 mmol/L — AB (ref 137–147)

## 2016-03-19 LAB — CBC AND DIFFERENTIAL
HCT: 37 % — AB (ref 41–53)
HEMOGLOBIN: 12 g/dL — AB (ref 13.5–17.5)
Platelets: 210 10*3/uL (ref 150–399)
WBC: 6.4 10*3/mL

## 2016-03-20 ENCOUNTER — Encounter: Payer: Self-pay | Admitting: Internal Medicine

## 2016-03-20 ENCOUNTER — Non-Acute Institutional Stay (SKILLED_NURSING_FACILITY): Payer: Medicare Other | Admitting: Internal Medicine

## 2016-03-20 DIAGNOSIS — B86 Scabies: Secondary | ICD-10-CM | POA: Diagnosis not present

## 2016-03-20 DIAGNOSIS — F0280 Dementia in other diseases classified elsewhere without behavioral disturbance: Secondary | ICD-10-CM

## 2016-03-20 DIAGNOSIS — G301 Alzheimer's disease with late onset: Secondary | ICD-10-CM

## 2016-03-20 NOTE — Progress Notes (Signed)
Patient ID: Mike Williams, male   DOB: 1932/08/04, 82 y.o.   MRN: [id number]    DATE: 03/20/2016  Location:    [facility] Room Number: 107 A Place of Service: SNF (31)   Extended Emergency Contact Information Primary Emergency Contact: Guido,[name] Address: [address]          [place], [place] [address] [place] Phone: [phone] Relation: Spouse  Advanced Directive information Does patient have an advance directive?: Yes, Does patient want to make changes to advanced directive?: No - Patient declined  Chief Complaint  Patient presents with  . Acute Visit    Left hand problem    HPI:  82 yo male long term resident seen today for left hand rash x 1 day. No c/o of itching or intense scratching but pt is withdrawn. He is a poor historian due to dementia. Hx obtained from chart. No recent travel hx or insect bites. Wife present at bedside  Hypertension with aortic insufficiency  - BP stable on benicar 20 mg daily; apresoline  50 mg three times daily; plendil  10 mg daily   Seizure d/o - no reports of seizure on keppra 250 mg twice daily   Urge incontinence - stable on enablex 7.5 mg daily  Hx CVA -  stable on plavix 75 mg daily   Alzheimer's disease - stable and does not presently take medications   PBA - stable on nuedexta 20-10 mg twice daily   Depression - stable on lexapro 5 mg daily     Past Medical History:  Diagnosis Date  . Aortic insufficiency 07/04/2011   2/13 Study Conclusions  - Left ventricle: Systolic function was normal. The estimated ejection fraction was in the range of 55% to 60%. - Aortic valve: Moderate regurgitation. - Aorta: The aorta was moderately dilated. Transthoracic echocardiography. M-mode, complete 2D, spectral Doppler, and color Doppler. Height: Height: 170.2cm. Height: 67in. Weight: Weight: 84.1kg. Weight: 185lb. Body mass index: BMI: 29kg/m^2. Body surface area: BSA: 2.2m2. Blood pressure: 175/86. Patient  status: Inpatient. Location: Bedside.      . Aortic root dilatation (HCC)    Noted on 09/2010 echo, with mild-mod AI  . CEREBROVASCULAR ACCIDENT, HX OF 02/09/2007  . DEMENTIA 05/12/2007  . Hemorrhoids   . HYPERGLYCEMIA 09/21/2009  . HYPERTENSION 12/29/2006  . Lactose intolerance   . OBSTRUCTIVE SLEEP APNEA 12/29/2006  . PROSTATE CANCER, HX OF 12/29/2006  . Prostatitis   . Risk for falls   . Seizures ([place]     Past Surgical History:  Procedure Laterality Date  . CHOLECYSTECTOMY    . GANGLION CYST EXCISION    . PROSTATE SURGERY     cryotherapy    Patient Care Team: SMarin Olp MD as PCP - General (Family Medicine)  Social History   Social History  . Marital status: Married    Spouse name: N/A  . Number of children: 2  . Years of education: master's   Occupational History  . retired    Social History Main Topics  . Smoking status: Never Smoker  . Smokeless tobacco: Never Used  . Alcohol use No  . Drug use: No  . Sexual activity: Not on file   Other Topics Concern  . Not on file   Social History Narrative   Married 52 years in 2016 to wife [place]ann[place] 2 grown sons. 2 grandkids-boy and girl.       Retired from pEngineer, manufacturing systemsin [employer]  Patient does not drink caffeine.   Patient is right handed.     reports that he has never smoked. He has never used smokeless tobacco. He reports that he does not drink alcohol or use drugs.  Family History  Problem Relation Age of Onset  . Heart attack Father   . Heart disease Father   . Cancer Sister    Family Status  Relation Status  . Father Deceased  . Sister Deceased at age 53  . Mother Deceased at age 31   never had contact with  . Brother Pension scheme manager History  Administered Date(s) Administered  . Influenza Split 01/22/2011, 01/15/2012  . Influenza Whole 04/04/2006, 05/12/2007, 01/04/2010  . Influenza,inj,Quad PF,36+ Mos 02/01/2013, 02/14/2014, 01/11/2015  .  Influenza-Unspecified 02/10/2016  . PPD Test 05/22/2015  . Pneumococcal Conjugate-13 01/11/2015  . Pneumococcal Polysaccharide-23 05/12/2007  . Td 09/23/1998    No Known Allergies  Medications: Patient's Medications  New Prescriptions   No medications on file  Previous Medications   CLOPIDOGREL (PLAVIX) 75 MG TABLET    take 1 tablet by mouth once daily   DARIFENACIN (ENABLEX) 7.5 MG 24 HR TABLET    Take 1 tablet (7.5 mg total) by mouth daily.   DEXTROMETHORPHAN-QUINIDINE (NUEDEXTA) 20-10 MG CAPS    Take 1 capsule by mouth 2 (two) times daily.   ESCITALOPRAM (LEXAPRO) 5 MG TABLET    Take 5 mg by mouth daily. For depression   FELODIPINE (PLENDIL) 10 MG 24 HR TABLET    Take 10 mg by mouth daily.   HYDRALAZINE (APRESOLINE) 50 MG TABLET    take 1 tablet by mouth three times a day   LEVETIRACETAM (KEPPRA) 250 MG TABLET    Take 1 tablet (250 mg total) by mouth 2 (two) times daily.   OLMESARTAN (BENICAR) 40 MG TABLET    Take 40 mg by mouth daily.   SKIN PROTECTANTS, MISC. (CALAZIME SKIN PROTECTANT) PSTE    Apply 1 application topically 2 (two) times daily.  Modified Medications   No medications on file  Discontinued Medications   BENICAR 20 MG TABLET    take 1 tablet by mouth once daily    Review of Systems  Unable to perform ROS: Dementia    Vitals:   03/20/16 1150  BP: (!) 152/87  Pulse: 67  Resp: 16  Temp: 97.9 F (36.6 C)  TempSrc: Oral  SpO2: 98%  Weight: 142 lb 8 oz (64.6 kg)  Height: 5' 7"  (1.702 m)   Body mass index is 22.32 kg/m.  Physical Exam  Constitutional: He appears well-developed and well-nourished.  Musculoskeletal: He exhibits edema.  Neurological: He is alert.  Skin: Skin is warm and dry. Rash (involving L>R webs of hand with linear burrows. no vesicles or redness. no d/c. no excoriations. no secondary signs of infection) noted.  Psychiatric: He has a normal mood and affect. He is withdrawn.     Labs reviewed: Nursing Home on 03/20/2016  Component  Date Value Ref Range Status  . Hemoglobin 02/20/2016 11.9* 13.5 - 17.5 g/dL Final  . HCT 02/20/2016 37* 41 - 53 % Final  . Platelets 02/20/2016 212  150 - 399 K/L Final  . WBC 02/20/2016 6.1  10^3/mL Final  . Glucose 02/25/2016 100  mg/dL Final  . BUN 02/25/2016 20  4 - 21 mg/dL Final  . Creatinine 02/25/2016 1.2  0.6 - 1.3 mg/dL Final  . Potassium 02/25/2016 4.1  3.4 - 5.3 mmol/L Final  . Sodium  02/25/2016 154* 137 - 147 mmol/L Final  . Glucose 02/27/2016 82  mg/dL Final  . BUN 02/27/2016 17  4 - 21 mg/dL Final  . Creatinine 02/27/2016 1.1  0.6 - 1.3 mg/dL Final  . Potassium 02/27/2016 3.7  3.4 - 5.3 mmol/L Final  . Sodium 02/27/2016 152* 137 - 147 mmol/L Final  Nursing Home on 01/29/2016  Component Date Value Ref Range Status  . Hemoglobin 12/29/2015 10.9* 13.5 - 17.5 g/dL Final  . HCT 12/29/2015 32* 41 - 53 % Final  . Platelets 12/29/2015 182  150 - 399 K/L Final  . WBC 12/29/2015 4.7  10^3/mL Final  . Glucose 12/29/2015 82  mg/dL Final  . BUN 12/29/2015 19  4 - 21 mg/dL Final  . Creatinine 12/29/2015 1.0  0.6 - 1.3 mg/dL Final  . Potassium 12/29/2015 3.8  3.4 - 5.3 mmol/L Final  . Sodium 12/29/2015 147  137 - 147 mmol/L Final  Nursing Home on 12/31/2015  Component Date Value Ref Range Status  . Hemoglobin 12/29/2015 10.9* 13.5 - 17.5 g/dL Final  . HCT 12/29/2015 32* 41 - 53 % Final  . Platelets 12/29/2015 182  150 - 399 K/L Final  . WBC 12/29/2015 4.7  10^3/mL Final  . Glucose 12/29/2015 82  mg/dL Final  . BUN 12/29/2015 19  4 - 21 mg/dL Final  . Creatinine 12/29/2015 1.0  0.6 - 1.3 mg/dL Final  . Potassium 12/29/2015 3.8  3.4 - 5.3 mmol/L Final  . Sodium 12/29/2015 147  137 - 147 mmol/L Final  Admission on 12/23/2015, Discharged on 12/26/2015  Component Date Value Ref Range Status  . Sodium 12/23/2015 143  135 - 145 mmol/L Final  . Potassium 12/23/2015 4.4  3.5 - 5.1 mmol/L Final  . Chloride 12/23/2015 107  101 - 111 mmol/L Final  . BUN 12/23/2015 22* 6 - 20 mg/dL  Final  . Creatinine, Ser 12/23/2015 1.40* 0.61 - 1.24 mg/dL Final  . Glucose, Bld 12/23/2015 134* 65 - 99 mg/dL Final  . Calcium, Ion 12/23/2015 0.96* 1.12 - 1.23 mmol/L Final  . TCO2 12/23/2015 27  0 - 100 mmol/L Final  . Hemoglobin 12/23/2015 13.6  13.0 - 17.0 g/dL Final  . HCT 12/23/2015 40.0  39.0 - 52.0 % Final  . Alcohol, Ethyl (B) 12/23/2015 <5  <5 mg/dL Final   Comment:        LOWEST DETECTABLE LIMIT FOR SERUM ALCOHOL IS 5 mg/dL FOR MEDICAL PURPOSES ONLY   . Prothrombin Time 12/23/2015 14.8  11.4 - 15.2 seconds Final  . INR 12/23/2015 1.16   Final  . aPTT 12/23/2015 32  24 - 36 seconds Final  . WBC 12/23/2015 17.4* 4.0 - 10.5 K/uL Final  . RBC 12/23/2015 4.51  4.22 - 5.81 MIL/uL Final  . Hemoglobin 12/23/2015 12.8* 13.0 - 17.0 g/dL Final  . HCT 12/23/2015 39.9  39.0 - 52.0 % Final  . MCV 12/23/2015 88.5  78.0 - 100.0 fL Final  . MCH 12/23/2015 28.4  26.0 - 34.0 pg Final  . MCHC 12/23/2015 32.1  30.0 - 36.0 g/dL Final  . RDW 12/23/2015 14.2  11.5 - 15.5 % Final  . Platelets 12/23/2015 180  150 - 400 K/uL Final  . Neutrophils Relative % 12/23/2015 78  % Final  . Neutro Abs 12/23/2015 13.5* 1.7 - 7.7 K/uL Final  . Lymphocytes Relative 12/23/2015 15  % Final  . Lymphs Abs 12/23/2015 2.6  0.7 - 4.0 K/uL Final  . Monocytes Relative 12/23/2015 7  %  Final  . Monocytes Absolute 12/23/2015 1.2* 0.1 - 1.0 K/uL Final  . Eosinophils Relative 12/23/2015 0  % Final  . Eosinophils Absolute 12/23/2015 0.0  0.0 - 0.7 K/uL Final  . Basophils Relative 12/23/2015 0  % Final  . Basophils Absolute 12/23/2015 0.0  0.0 - 0.1 K/uL Final  . Sodium 12/23/2015 141  135 - 145 mmol/L Final  . Potassium 12/23/2015 3.6  3.5 - 5.1 mmol/L Final  . Chloride 12/23/2015 107  101 - 111 mmol/L Final  . CO2 12/23/2015 25  22 - 32 mmol/L Final  . Glucose, Bld 12/23/2015 139* 65 - 99 mg/dL Final  . BUN 12/23/2015 16  6 - 20 mg/dL Final  . Creatinine, Ser 12/23/2015 1.33* 0.61 - 1.24 mg/dL Final  . Calcium  12/23/2015 8.8* 8.9 - 10.3 mg/dL Final  . Total Protein 12/23/2015 7.1  6.5 - 8.1 g/dL Final  . Albumin 12/23/2015 3.2* 3.5 - 5.0 g/dL Final  . AST 12/23/2015 18  15 - 41 U/L Final  . ALT 12/23/2015 14* 17 - 63 U/L Final  . Alkaline Phosphatase 12/23/2015 83  38 - 126 U/L Final  . Total Bilirubin 12/23/2015 0.8  0.3 - 1.2 mg/dL Final  . GFR calc non Af Amer 12/23/2015 49* >60 mL/min Final  . GFR calc Af Amer 12/23/2015 57* >60 mL/min Final   Comment: (NOTE) The eGFR has been calculated using the CKD EPI equation. This calculation has not been validated in all clinical situations. eGFR's persistently <60 mL/min signify possible Chronic Kidney Disease.   . Anion gap 12/23/2015 9  5 - 15 Final  . Troponin i, poc 12/23/2015 0.00  0.00 - 0.08 ng/mL Final  . Comment 3 12/23/2015          Final   Comment: Due to the release kinetics of cTnI, a negative result within the first hours of the onset of symptoms does not rule out myocardial infarction with certainty. If myocardial infarction is still suspected, repeat the test at appropriate intervals.   . Opiates 12/23/2015 NONE DETECTED  NONE DETECTED Final  . Cocaine 12/23/2015 NONE DETECTED  NONE DETECTED Final  . Benzodiazepines 12/23/2015 NONE DETECTED  NONE DETECTED Final  . Amphetamines 12/23/2015 NONE DETECTED  NONE DETECTED Final  . Tetrahydrocannabinol 12/23/2015 NONE DETECTED  NONE DETECTED Final  . Barbiturates 12/23/2015 NONE DETECTED  NONE DETECTED Final   Comment:        DRUG SCREEN FOR MEDICAL PURPOSES ONLY.  IF CONFIRMATION IS NEEDED FOR ANY PURPOSE, NOTIFY LAB WITHIN 5 DAYS.        LOWEST DETECTABLE LIMITS FOR URINE DRUG SCREEN Drug Class       Cutoff (ng/mL) Amphetamine      1000 Barbiturate      200 Benzodiazepine   742 Tricyclics       595 Opiates          300 Cocaine          300 THC              50   . Color, Urine 12/23/2015 YELLOW  YELLOW Final  . APPearance 12/23/2015 CLOUDY* CLEAR Final  . Specific  Gravity, Urine 12/23/2015 1.017  1.005 - 1.030 Final  . pH 12/23/2015 6.5  5.0 - 8.0 Final  . Glucose, UA 12/23/2015 NEGATIVE  NEGATIVE mg/dL Final  . Hgb urine dipstick 12/23/2015 TRACE* NEGATIVE Final  . Bilirubin Urine 12/23/2015 NEGATIVE  NEGATIVE Final  . Ketones, ur 12/23/2015 NEGATIVE  NEGATIVE  mg/dL Final  . Protein, ur 12/23/2015 30* NEGATIVE mg/dL Final  . Nitrite 12/23/2015 NEGATIVE  NEGATIVE Final  . Leukocytes, UA 12/23/2015 LARGE* NEGATIVE Final  . Lactic Acid, Venous 12/23/2015 1.96* 0.5 - 1.9 mmol/L Final  . Glucose-Capillary 12/23/2015 143* 65 - 99 mg/dL Final  . Comment 1 12/23/2015 Notify RN   Final  . Hgb A1c MFr Bld 12/25/2015 5.5  4.8 - 5.6 % Final   Comment: (NOTE)         Pre-diabetes: 5.7 - 6.4         Diabetes: >6.4         Glycemic control for adults with diabetes: <7.0   . Mean Plasma Glucose 12/25/2015 111  mg/dL Final   Comment: (NOTE) Performed At: [facility] [place], [place] [id number] Lindon Romp MD LE:[id number]   . Sodium 12/24/2015 143  135 - 145 mmol/L Final  . Potassium 12/24/2015 3.4* 3.5 - 5.1 mmol/L Final  . Chloride 12/24/2015 109  101 - 111 mmol/L Final  . CO2 12/24/2015 27  22 - 32 mmol/L Final  . Glucose, Bld 12/24/2015 77  65 - 99 mg/dL Final  . BUN 12/24/2015 15  6 - 20 mg/dL Final  . Creatinine, Ser 12/24/2015 1.03  0.61 - 1.24 mg/dL Final  . Calcium 12/24/2015 8.7* 8.9 - 10.3 mg/dL Final  . Total Protein 12/24/2015 6.0* 6.5 - 8.1 g/dL Final  . Albumin 12/24/2015 2.8* 3.5 - 5.0 g/dL Final  . AST 12/24/2015 17  15 - 41 U/L Final  . ALT 12/24/2015 13* 17 - 63 U/L Final  . Alkaline Phosphatase 12/24/2015 68  38 - 126 U/L Final  . Total Bilirubin 12/24/2015 0.5  0.3 - 1.2 mg/dL Final  . GFR calc non Af Amer 12/24/2015 >60  >60 mL/min Final  . GFR calc Af Amer 12/24/2015 >60  >60 mL/min Final   Comment: (NOTE) The eGFR has been calculated using the CKD EPI equation. This calculation has not been  validated in all clinical situations. eGFR's persistently <60 mL/min signify possible Chronic Kidney Disease.   . Anion gap 12/24/2015 7  5 - 15 Final  . WBC 12/24/2015 7.9  4.0 - 10.5 K/uL Final  . RBC 12/24/2015 4.07* 4.22 - 5.81 MIL/uL Final  . Hemoglobin 12/24/2015 11.7* 13.0 - 17.0 g/dL Final  . HCT 12/24/2015 34.6* 39.0 - 52.0 % Final  . MCV 12/24/2015 85.0  78.0 - 100.0 fL Final  . MCH 12/24/2015 28.7  26.0 - 34.0 pg Final  . MCHC 12/24/2015 33.8  30.0 - 36.0 g/dL Final  . RDW 12/24/2015 14.2  11.5 - 15.5 % Final  . Platelets 12/24/2015 156  150 - 400 K/uL Final  . MRSA by PCR 12/23/2015 NEGATIVE  NEGATIVE Final   Comment:        The GeneXpert MRSA Assay (FDA approved for NASAL specimens only), is one component of a comprehensive MRSA colonization surveillance program. It is not intended to diagnose MRSA infection nor to guide or monitor treatment for MRSA infections.   . Squamous Epithelial / LPF 12/23/2015 0-5* NONE SEEN Final  . WBC, UA 12/23/2015 TOO NUMEROUS TO COUNT  0 - 5 WBC/hpf Final  . RBC / HPF 12/23/2015 6-30  0 - 5 RBC/hpf Final  . Bacteria, UA 12/23/2015 MANY* NONE SEEN Final  . Cholesterol 12/24/2015 104  0 - 200 mg/dL Final  . Triglycerides 12/24/2015 42  <150 mg/dL Final  . HDL 12/24/2015 46  >40  mg/dL Final  . Total CHOL/HDL Ratio 12/24/2015 2.3  RATIO Final  . VLDL 12/24/2015 8  0 - 40 mg/dL Final  . LDL Cholesterol 12/24/2015 50  0 - 99 mg/dL Final   Comment:        Total Cholesterol/HDL:CHD Risk Coronary Heart Disease Risk Table                     Men   Women  1/2 Average Risk   3.4   3.3  Average Risk       5.0   4.4  2 X Average Risk   9.6   7.1  3 X Average Risk  23.4   11.0        Use the calculated Patient Ratio above and the CHD Risk Table to determine the patient's CHD Risk.        ATP III CLASSIFICATION (LDL):  <100     mg/dL   Optimal  100-129  mg/dL   Near or Above                    Optimal  130-159  mg/dL   Borderline   160-189  mg/dL   High  >190     mg/dL   Very High   . Specimen Description 12/25/2015 URINE, RANDOM   Final  . Special Requests 12/25/2015 Rocephin, Please use the urine specimen in the lab   Final  . Culture 12/25/2015 MULTIPLE SPECIES PRESENT, SUGGEST RECOLLECTION*  Final  . Report Status 12/25/2015 12/25/2015 FINAL   Final  . Sodium 12/25/2015 141  135 - 145 mmol/L Final  . Potassium 12/25/2015 3.3* 3.5 - 5.1 mmol/L Final  . Chloride 12/25/2015 106  101 - 111 mmol/L Final  . CO2 12/25/2015 26  22 - 32 mmol/L Final  . Glucose, Bld 12/25/2015 74  65 - 99 mg/dL Final  . BUN 12/25/2015 11  6 - 20 mg/dL Final  . Creatinine, Ser 12/25/2015 0.92  0.61 - 1.24 mg/dL Final  . Calcium 12/25/2015 8.8* 8.9 - 10.3 mg/dL Final  . GFR calc non Af Amer 12/25/2015 >60  >60 mL/min Final  . GFR calc Af Amer 12/25/2015 >60  >60 mL/min Final   Comment: (NOTE) The eGFR has been calculated using the CKD EPI equation. This calculation has not been validated in all clinical situations. eGFR's persistently <60 mL/min signify possible Chronic Kidney Disease.   . Anion gap 12/25/2015 9  5 - 15 Final  . WBC 12/25/2015 6.7  4.0 - 10.5 K/uL Final  . RBC 12/25/2015 4.27  4.22 - 5.81 MIL/uL Final  . Hemoglobin 12/25/2015 11.9* 13.0 - 17.0 g/dL Final  . HCT 12/25/2015 36.8* 39.0 - 52.0 % Final  . MCV 12/25/2015 86.2  78.0 - 100.0 fL Final  . MCH 12/25/2015 27.9  26.0 - 34.0 pg Final  . MCHC 12/25/2015 32.3  30.0 - 36.0 g/dL Final  . RDW 12/25/2015 13.9  11.5 - 15.5 % Final  . Platelets 12/25/2015 185  150 - 400 K/uL Final  . Sodium 12/26/2015 141  135 - 145 mmol/L Final  . Potassium 12/26/2015 4.1  3.5 - 5.1 mmol/L Final  . Chloride 12/26/2015 110  101 - 111 mmol/L Final  . CO2 12/26/2015 26  22 - 32 mmol/L Final  . Glucose, Bld 12/26/2015 119* 65 - 99 mg/dL Final  . BUN 12/26/2015 14  6 - 20 mg/dL Final  . Creatinine, Ser 12/26/2015 1.04  0.61 -  1.24 mg/dL Final  . Calcium 12/26/2015 8.5* 8.9 - 10.3 mg/dL  Final  . GFR calc non Af Amer 12/26/2015 >60  >60 mL/min Final  . GFR calc Af Amer 12/26/2015 >60  >60 mL/min Final   Comment: (NOTE) The eGFR has been calculated using the CKD EPI equation. This calculation has not been validated in all clinical situations. eGFR's persistently <60 mL/min signify possible Chronic Kidney Disease.   . Anion gap 12/26/2015 5  5 - 15 Final    No results found.   Assessment/Plan   ICD-9-CM ICD-10-CM   1. Scabies 133.0 B86   2. Late onset Alzheimer's disease without behavioral disturbance 331.0 G30.1    294.10 F02.80      Treat with permithrin cream 5% apply head-toe in PM, leave on at least 12 hrs and wash off in AM. Repeat dose in 2 weeks  Contact isolation until tx completed  Cont other meds as ordered  Will follow Mikaelah Trostle S. Perlie Gold  [facility] [address] [place], [place] [address] [phone] Cell (Monday-Friday 8 AM - 5 PM) [phone] After 5 PM and follow prompts

## 2016-04-16 ENCOUNTER — Encounter: Payer: Self-pay | Admitting: Adult Health

## 2016-04-16 ENCOUNTER — Non-Acute Institutional Stay (SKILLED_NURSING_FACILITY): Payer: Medicare Other | Admitting: Adult Health

## 2016-04-16 DIAGNOSIS — F482 Pseudobulbar affect: Secondary | ICD-10-CM | POA: Diagnosis not present

## 2016-04-16 DIAGNOSIS — R569 Unspecified convulsions: Secondary | ICD-10-CM

## 2016-04-16 DIAGNOSIS — G301 Alzheimer's disease with late onset: Secondary | ICD-10-CM | POA: Diagnosis not present

## 2016-04-16 DIAGNOSIS — Z8673 Personal history of transient ischemic attack (TIA), and cerebral infarction without residual deficits: Secondary | ICD-10-CM | POA: Diagnosis not present

## 2016-04-16 DIAGNOSIS — I10 Essential (primary) hypertension: Secondary | ICD-10-CM | POA: Diagnosis not present

## 2016-04-16 DIAGNOSIS — N3281 Overactive bladder: Secondary | ICD-10-CM

## 2016-04-16 DIAGNOSIS — F0280 Dementia in other diseases classified elsewhere without behavioral disturbance: Secondary | ICD-10-CM | POA: Diagnosis not present

## 2016-04-16 NOTE — Progress Notes (Signed)
Patient ID: Mike Williams, male   DOB: February 10, 1933, 82 y.o.   MRN: [id number]   Location:   [place] Room Number: 107-A Place of Service:  SNF (31)   CODE STATUS: Full Code  No Known Allergies  Chief Complaint  Patient presents with  . Medical Management of Chronic Issues    Follow up    HPI:  He is a long term resident of this facility being seen for the management of his chronic illnesses. Overall there is little change in his status. He is unable to participate in the hpi or ros. There are no nursing concerns at this time.    Past Medical History:  Diagnosis Date  . Aortic insufficiency 07/04/2011   2/13 Study Conclusions  - Left ventricle: Systolic function was normal. The estimated ejection fraction was in the range of 55% to 60%. - Aortic valve: Moderate regurgitation. - Aorta: The aorta was moderately dilated. Transthoracic echocardiography. M-mode, complete 2D, spectral Doppler, and color Doppler. Height: Height: 170.2cm. Height: 67in. Weight: Weight: 84.1kg. Weight: 185lb. Body mass index: BMI: 29kg/m^2. Body surface area: BSA: 2.80m^2. Blood pressure: 175/86. Patient status: Inpatient. Location: Bedside.      . Aortic root dilatation (HCC)    Noted on 09/2010 echo, with mild-mod AI  . CEREBROVASCULAR ACCIDENT, HX OF 02/09/2007  . DEMENTIA 05/12/2007  . Hemorrhoids   . HYPERGLYCEMIA 09/21/2009  . HYPERTENSION 12/29/2006  . Lactose intolerance   . OBSTRUCTIVE SLEEP APNEA 12/29/2006  . PROSTATE CANCER, HX OF 12/29/2006  . Prostatitis   . Risk for falls   . Seizures (Mike Williams)     Past Surgical History:  Procedure Laterality Date  . CHOLECYSTECTOMY    . GANGLION CYST EXCISION    . PROSTATE SURGERY     cryotherapy    Social History   Social History  . Marital status: Married    Spouse name: N/A  . Number of children: 2  . Years of education: master's   Occupational History  . retired    Social History Main Topics  . Smoking status: Never Smoker  .  Smokeless tobacco: Never Used  . Alcohol use No  . Drug use: No  . Sexual activity: Not on file   Other Topics Concern  . Not on file   Social History Narrative   Married 52 years in 2016 to wife Mike Williams (goes to [place] urgent care). 2 grown sons. 2 grandkids-boy and girl.       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       Patient does not drink caffeine.   Patient is right handed.   Family History  Problem Relation Age of Onset  . Heart attack Father   . Heart disease Father   . Cancer Sister       VITAL SIGNS BP 140/80   Pulse 71   Temp 97.9 F (36.6 C) (Oral)   Resp 18   Ht 5\' 7"  (1.702 m)   Wt 142 lb (64.4 kg)   SpO2 97%   BMI 22.24 kg/m   Patient's Medications  New Prescriptions   No medications on file  Previous Medications   CLOPIDOGREL (PLAVIX) 75 MG TABLET    take 1 tablet by mouth once daily   DARIFENACIN (ENABLEX) 7.5 MG 24 HR TABLET    Take 1 tablet (7.5 mg total) by mouth daily.   DEXTROMETHORPHAN-QUINIDINE (NUEDEXTA) 20-10 MG CAPS    Take 1 capsule by mouth 2 (two) times daily.   ESCITALOPRAM (LEXAPRO)  5 MG TABLET    Take 5 mg by mouth daily. For depression   FELODIPINE (PLENDIL) 10 MG 24 HR TABLET    Take 10 mg by mouth daily.   HYDRALAZINE (APRESOLINE) 50 MG TABLET    take 1 tablet by mouth three times a day   LEVETIRACETAM (KEPPRA) 250 MG TABLET    Take 1 tablet (250 mg total) by mouth 2 (two) times daily.   OLMESARTAN (BENICAR) 40 MG TABLET    Take 40 mg by mouth daily.   SKIN PROTECTANTS, MISC. (CALAZIME SKIN PROTECTANT) PSTE    Apply 1 application topically 2 (two) times daily.  Modified Medications   No medications on file  Discontinued Medications   No medications on file     SIGNIFICANT DIAGNOSTIC EXAMS  LABS REVIEWED:   07-05-15: wbc 6.6; hgb 13.8; hct 41.3; mcv 85.2; plt 236; glucose 86; bun 13.4; creat 0.92; k+ 4.1; na++144; liver normal albumin 3.5  12-29-15: wbc 4.7; hgb 10.9; hct 31.9; mcv 82.5; plt 182; glucose 82; bun  18.8; creat 0.97; k+ 3.8; na++ 147  03-19-16: wbc 6.4; hgb 12.0; hct 37.2; mcv 89.7 ;plt 210; glucose 78; bun 19.5; creat 0.93; k+ 4.1; na++ 148    Review of Systems  Unable to perform ROS: dementia    Physical Exam  Constitutional: No distress.  Eyes: Conjunctivae are normal.  Neck: Neck supple. No JVD present. No thyromegaly present.  Cardiovascular: Normal rate, regular rhythm and intact distal pulses.   Respiratory: Effort normal and breath sounds normal. No respiratory distress. He has no wheezes.  GI: Soft. Bowel sounds are normal. He exhibits no distension. There is no tenderness.  Musculoskeletal: He exhibits no edema.  Able to move all extremities   Lymphadenopathy:    He has no cervical adenopathy.  Neurological: He is alert.  Skin: Skin is warm and dry. He is not diaphoretic.  Psychiatric: He has a normal mood and affect.     ASSESSMENT/ PLAN:  1. Hypertension: has aortic insufficiency   will continue benicar 20 mg daily; apresoline  50 mg three times daily  plendil  10 mg daily   2. Seizure: no reports of seizure present will continue keppra 250 mg twice daily   3. UI: will continue enablex 7.5 mg daily  4. CVA: is neurologically stable will continue plavix 75 mg daily   5. Alzheimer's disease: is without significant change in status; is presently not on medications; will not make changes at this time;his current weight is 148 pounds.  will monitor   6. PBA: will continue nuedexta 20-10 mg twice daily and will monitor   7. Depression: will continue lexapro 5 mg daily      Ok Edwards NP [facility]  Contact [phone] Monday through Friday 8am- 5pm  After hours call [phone]

## 2016-05-19 ENCOUNTER — Encounter: Payer: Self-pay | Admitting: Internal Medicine

## 2016-05-19 ENCOUNTER — Non-Acute Institutional Stay (SKILLED_NURSING_FACILITY): Payer: Medicare Other | Admitting: Internal Medicine

## 2016-05-19 DIAGNOSIS — F0280 Dementia in other diseases classified elsewhere without behavioral disturbance: Secondary | ICD-10-CM | POA: Diagnosis not present

## 2016-05-19 DIAGNOSIS — G301 Alzheimer's disease with late onset: Secondary | ICD-10-CM

## 2016-05-19 DIAGNOSIS — L308 Other specified dermatitis: Secondary | ICD-10-CM

## 2016-05-19 NOTE — Progress Notes (Signed)
Patient ID: Garn Connerly, male   DOB: [birth year], 83 y.o.   MRN: [id number]    DATE: 05/19/2016  Location:    [facility] Room Number: 107 A Place of Service: SNF (31)   Extended Emergency Contact Information Primary Emergency Contact: Grippi,[name] Address: [address]          [place], [place] [address] [place] Phone: [phone] Relation: Spouse  Advanced Directive information Does Patient Have a Medical Advance Directive?: Yes, Type of Advance Directive: Out of facility DNR (pink MOST or yellow form), Pre-existing out of facility DNR order (yellow form or pink MOST form): Pink MOST form placed in chart (order not valid for inpatient use) (Attempt CPR), Does patient want to make changes to medical advance directive?: No - Patient declined  Chief Complaint  Patient presents with  . Acute Visit    rash    HPI:  83 yo male long term resident seen today for rash on legs and left elbow for unknown duration. Rash is pruritic. No vesicular formation. No f/c. No known insect bites.  He was tx for scabies last month. He is a poor historian due to dementia. Hx obtained from chart.    Past Medical History:  Diagnosis Date  . Aortic insufficiency 07/04/2011   2/13 Study Conclusions  - Left ventricle: Systolic function was normal. The estimated ejection fraction was in the range of 55% to 60%. - Aortic valve: Moderate regurgitation. - Aorta: The aorta was moderately dilated. Transthoracic echocardiography. M-mode, complete 2D, spectral Doppler, and color Doppler. Height: Height: 170.2cm. Height: 67in. Weight: Weight: 84.1kg. Weight: 185lb. Body mass index: BMI: 29kg/m^2. Body surface area: BSA: 2.41m^2. Blood pressure: 175/86. Patient status: Inpatient. Location: Bedside.      . Aortic root dilatation (HCC)    Noted on 09/2010 echo, with mild-mod AI  . CEREBROVASCULAR ACCIDENT, HX OF 02/09/2007  . DEMENTIA 05/12/2007  . Hemorrhoids   . HYPERGLYCEMIA 09/21/2009    . HYPERTENSION 12/29/2006  . Lactose intolerance   . OBSTRUCTIVE SLEEP APNEA 12/29/2006  . PROSTATE CANCER, HX OF 12/29/2006  . Prostatitis   . Risk for falls   . Seizures ([place])     Past Surgical History:  Procedure Laterality Date  . CHOLECYSTECTOMY    . GANGLION CYST EXCISION    . PROSTATE SURGERY     cryotherapy    Patient Care Team: Marin Olp, MD as PCP - General (Family Medicine)  Social History   Social History  . Marital status: Married    Spouse name: N/A  . Number of children: 2  . Years of education: master's   Occupational History  . retired    Social History Main Topics  . Smoking status: Never Smoker  . Smokeless tobacco: Never Used  . Alcohol use No  . Drug use: No  . Sexual activity: Not on file   Other Topics Concern  . Not on file   Social History Narrative   Married 52 years in 2016 to wife [name] (goes to [place] urgent care). 2 grown sons. 2 grandkids-boy and girl.       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       Patient does not drink caffeine.   Patient is right handed.     reports that he has never smoked. He has never used smokeless tobacco. He reports that he does not drink alcohol or use drugs.  Family History  Problem Relation Age of Onset  . Heart attack  Father   . Heart disease Father   . Cancer Sister    Family Status  Relation Status  . Father Deceased  . Sister Deceased at age 62  . Mother Deceased at age 67   never had contact with  . Brother Pension scheme manager History  Administered Date(s) Administered  . Influenza Split 01/22/2011, 01/15/2012  . Influenza Whole 04/04/2006, 05/12/2007, 01/04/2010  . Influenza,inj,Quad PF,36+ Mos 02/01/2013, 02/14/2014, 01/11/2015  . Influenza-Unspecified 02/10/2016  . PPD Test 05/22/2015  . Pneumococcal Conjugate-13 01/11/2015  . Pneumococcal Polysaccharide-23 05/12/2007  . Td 09/23/1998    No Known Allergies  Medications: Patient's Medications  New  Prescriptions   No medications on file  Previous Medications   CLOPIDOGREL (PLAVIX) 75 MG TABLET    take 1 tablet by mouth once daily   DARIFENACIN (ENABLEX) 7.5 MG 24 HR TABLET    Take 1 tablet (7.5 mg total) by mouth daily.   DEXTROMETHORPHAN-QUINIDINE (NUEDEXTA) 20-10 MG CAPS    Take 1 capsule by mouth 2 (two) times daily.   ESCITALOPRAM (LEXAPRO) 5 MG TABLET    Take 5 mg by mouth daily. For depression   FELODIPINE (PLENDIL) 10 MG 24 HR TABLET    Take 10 mg by mouth daily.   HYDRALAZINE (APRESOLINE) 50 MG TABLET    take 1 tablet by mouth three times a day   LEVETIRACETAM (KEPPRA) 250 MG TABLET    Take 1 tablet (250 mg total) by mouth 2 (two) times daily.   OLMESARTAN (BENICAR) 40 MG TABLET    Take 40 mg by mouth daily.   SKIN PROTECTANTS, MISC. (CALAZIME SKIN PROTECTANT) PSTE    Apply 1 application topically 2 (two) times daily.  Modified Medications   No medications on file  Discontinued Medications   No medications on file    Review of Systems  Unable to perform ROS: Dementia    Vitals:   05/19/16 1557  BP: 138/78  Pulse: 76  Resp: 18  Temp: 97.7 F (36.5 C)  TempSrc: Oral  SpO2: 97%  Weight: 125 lb (56.7 kg)  Height: 5\' 7"  (1.702 m)   Body mass index is 19.58 kg/m.  Physical Exam  Constitutional: He appears well-developed.  Frail appearing in NAD  Musculoskeletal: He exhibits edema.  Neurological: He is alert.  Skin: Skin is warm and dry. Rash (dry, scaling, patchy red rash on b/l elbow, popliteal fossa and b/l leg) noted.  No vesicular formation or linear burrows  Psychiatric: He has a normal mood and affect. His behavior is normal.     Labs reviewed: Abstract on 03/20/2016  Component Date Value Ref Range Status  . Hemoglobin 03/19/2016 12.0* 13.5 - 17.5 g/dL Final  . HCT 03/19/2016 37* 41 - 53 % Final  . Platelets 03/19/2016 210  150 - 399 K/L Final  . WBC 03/19/2016 6.4  10^3/mL Final  . Glucose 03/19/2016 78  mg/dL Final  . BUN 03/19/2016 20  4 - 21  mg/dL Final  . Creatinine 03/19/2016 0.9  0.6 - 1.3 mg/dL Final  . Potassium 03/19/2016 4.1  3.4 - 5.3 mmol/L Final  . Sodium 03/19/2016 148* 137 - 147 mmol/L Final  Nursing Home on 03/20/2016  Component Date Value Ref Range Status  . Hemoglobin 02/20/2016 11.9* 13.5 - 17.5 g/dL Final  . HCT 02/20/2016 37* 41 - 53 % Final  . Platelets 02/20/2016 212  150 - 399 K/L Final  . WBC 02/20/2016 6.1  10^3/mL Final  . Glucose 02/25/2016  100  mg/dL Final  . BUN 02/25/2016 20  4 - 21 mg/dL Final  . Creatinine 02/25/2016 1.2  0.6 - 1.3 mg/dL Final  . Potassium 02/25/2016 4.1  3.4 - 5.3 mmol/L Final  . Sodium 02/25/2016 154* 137 - 147 mmol/L Final  . Glucose 02/27/2016 82  mg/dL Final  . BUN 02/27/2016 17  4 - 21 mg/dL Final  . Creatinine 02/27/2016 1.1  0.6 - 1.3 mg/dL Final  . Potassium 02/27/2016 3.7  3.4 - 5.3 mmol/L Final  . Sodium 02/27/2016 152* 137 - 147 mmol/L Final    No results found.   Assessment/Plan   ICD-9-CM ICD-10-CM   1. Other eczema 692.9 L30.8    mildly exacerbated  2. Late onset Alzheimer's disease without behavioral disturbance 331.0 G30.1    294.10 F02.80    D/c triamcinolone as it is ineffective  rx betamethasone ointment daily x 3 weeks  Apply eucerin lotion at least daily to skin to keep moisturized  Cont other meds as ordered  Will follow  Joangel Vanosdol S. Perlie Gold  [facility] [address] [place], [place] [address] [phone] Cell (Monday-Friday 8 AM - 5 PM) [phone] After 5 PM and follow prompts

## 2016-05-20 ENCOUNTER — Non-Acute Institutional Stay (SKILLED_NURSING_FACILITY): Payer: Medicare Other | Admitting: Adult Health

## 2016-05-20 DIAGNOSIS — F0280 Dementia in other diseases classified elsewhere without behavioral disturbance: Secondary | ICD-10-CM

## 2016-05-20 DIAGNOSIS — I10 Essential (primary) hypertension: Secondary | ICD-10-CM

## 2016-05-20 DIAGNOSIS — R569 Unspecified convulsions: Secondary | ICD-10-CM

## 2016-05-20 DIAGNOSIS — G301 Alzheimer's disease with late onset: Secondary | ICD-10-CM

## 2016-05-20 DIAGNOSIS — F482 Pseudobulbar affect: Secondary | ICD-10-CM | POA: Diagnosis not present

## 2016-05-20 DIAGNOSIS — N3281 Overactive bladder: Secondary | ICD-10-CM | POA: Diagnosis not present

## 2016-05-23 DIAGNOSIS — R630 Anorexia: Secondary | ICD-10-CM | POA: Diagnosis not present

## 2016-05-23 DIAGNOSIS — F482 Pseudobulbar affect: Secondary | ICD-10-CM | POA: Diagnosis not present

## 2016-05-23 DIAGNOSIS — F329 Major depressive disorder, single episode, unspecified: Secondary | ICD-10-CM | POA: Diagnosis not present

## 2016-05-23 DIAGNOSIS — G309 Alzheimer's disease, unspecified: Secondary | ICD-10-CM | POA: Diagnosis not present

## 2016-05-30 ENCOUNTER — Encounter (HOSPITAL_COMMUNITY): Payer: Self-pay

## 2016-05-30 ENCOUNTER — Emergency Department (HOSPITAL_COMMUNITY)
Admission: EM | Admit: 2016-05-30 | Discharge: 2016-05-30 | Disposition: A | Discharge status: Home / Self Care | Payer: Medicare Other | Attending: Emergency Medicine | Admitting: Emergency Medicine

## 2016-05-30 ENCOUNTER — Emergency Department (HOSPITAL_COMMUNITY): Payer: Medicare Other

## 2016-05-30 DIAGNOSIS — Y939 Activity, unspecified: Secondary | ICD-10-CM | POA: Insufficient documentation

## 2016-05-30 DIAGNOSIS — Z8546 Personal history of malignant neoplasm of prostate: Secondary | ICD-10-CM | POA: Insufficient documentation

## 2016-05-30 DIAGNOSIS — Z79899 Other long term (current) drug therapy: Secondary | ICD-10-CM | POA: Diagnosis not present

## 2016-05-30 DIAGNOSIS — G309 Alzheimer's disease, unspecified: Secondary | ICD-10-CM | POA: Diagnosis not present

## 2016-05-30 DIAGNOSIS — I10 Essential (primary) hypertension: Secondary | ICD-10-CM | POA: Diagnosis not present

## 2016-05-30 DIAGNOSIS — Y92129 Unspecified place in nursing home as the place of occurrence of the external cause: Secondary | ICD-10-CM | POA: Insufficient documentation

## 2016-05-30 DIAGNOSIS — S79911A Unspecified injury of right hip, initial encounter: Secondary | ICD-10-CM | POA: Diagnosis not present

## 2016-05-30 DIAGNOSIS — W19XXXA Unspecified fall, initial encounter: Secondary | ICD-10-CM

## 2016-05-30 DIAGNOSIS — S7001XA Contusion of right hip, initial encounter: Secondary | ICD-10-CM | POA: Diagnosis not present

## 2016-05-30 DIAGNOSIS — Y999 Unspecified external cause status: Secondary | ICD-10-CM | POA: Insufficient documentation

## 2016-05-30 DIAGNOSIS — M25551 Pain in right hip: Secondary | ICD-10-CM | POA: Diagnosis not present

## 2016-05-30 DIAGNOSIS — W1830XA Fall on same level, unspecified, initial encounter: Secondary | ICD-10-CM | POA: Insufficient documentation

## 2016-05-30 MED ORDER — HYDRALAZINE HCL 25 MG PO TABS
50.0000 mg | ORAL_TABLET | Freq: Once | ORAL | Status: AC
  Administered 2016-05-30: 50 mg via ORAL
  Filled 2016-05-30: qty 2

## 2016-05-30 MED ORDER — ACETAMINOPHEN 500 MG PO TABS: 1000.0000 mg | ORAL_TABLET | Freq: Four times a day (QID) | ORAL | 0 refills | Status: DC | PRN

## 2016-05-30 MED ORDER — ACETAMINOPHEN 500 MG PO TABS
1000.0000 mg | ORAL_TABLET | Freq: Once | ORAL | Status: AC
  Administered 2016-05-30: 1000 mg via ORAL
  Filled 2016-05-30: qty 2

## 2016-05-30 NOTE — ED Triage Notes (Signed)
Pt from [facility] took a fall and is c/o hip pain

## 2016-05-30 NOTE — ED Provider Notes (Addendum)
[facility] DEPT Provider Note   CSN: [id number] Arrival date & time: 05/30/16  1745     History   Chief Complaint Chief Complaint  Patient presents with  . Fall    pt fell has R sided hip pain no shortening or deformity noted     HPI Mike Williams is a 83 y.o. male.  HPI Patient was sent to the emergency department with a reported fall at his nursing home facility. Patient has dementia and is a very poor historian. He does indicate that he fell and he indicates pain in the right hip. Patient denies he has pain in any other location.  Past Medical History:  Diagnosis Date  . Aortic insufficiency 07/04/2011   2/13 Study Conclusions  - Left ventricle: Systolic function was normal. The estimated ejection fraction was in the range of 55% to 60%. - Aortic valve: Moderate regurgitation. - Aorta: The aorta was moderately dilated. Transthoracic echocardiography. M-mode, complete 2D, spectral Doppler, and color Doppler. Height: Height: 170.2cm. Height: 67in. Weight: Weight: 84.1kg. Weight: 185lb. Body mass index: BMI: 29kg/m^2. Body surface area: BSA: 2.42m^2. Blood pressure: 175/86. Patient status: Inpatient. Location: Bedside.      . Aortic root dilatation (HCC)    Noted on 09/2010 echo, with mild-mod AI  . CEREBROVASCULAR ACCIDENT, HX OF 02/09/2007  . DEMENTIA 05/12/2007  . Hemorrhoids   . HYPERGLYCEMIA 09/21/2009  . HYPERTENSION 12/29/2006  . Lactose intolerance   . OBSTRUCTIVE SLEEP APNEA 12/29/2006  . PROSTATE CANCER, HX OF 12/29/2006  . Prostatitis   . Risk for falls   . Seizures [facility])     Patient Active Problem List   Diagnosis Date Noted  . Depression 12/01/2015  . PBA (pseudobulbar affect) 08/08/2015  . Overactive bladder 06/29/2014  . Seizures ([place]) 02/01/2013  . Aortic insufficiency 07/04/2011  . Alzheimer's dementia 05/12/2007  . History of CVA (cerebrovascular accident) 02/09/2007  . Obstructive sleep apnea 12/29/2006  . Essential hypertension 12/29/2006  . PROSTATE  CANCER, HX OF 12/29/2006    Past Surgical History:  Procedure Laterality Date  . CHOLECYSTECTOMY    . GANGLION CYST EXCISION    . PROSTATE SURGERY     cryotherapy       Home Medications    Prior to Admission medications   Medication Sig Start Date End Date Taking? Authorizing Provider  betamethasone dipropionate (DIPROLENE) 0.05 % cream Apply 1 application topically daily.   Yes Historical Provider, MD  clopidogrel (PLAVIX) 75 MG tablet take 1 tablet by mouth once daily 11/28/14  Yes Marin Olp, MD  darifenacin (ENABLEX) 7.5 MG 24 hr tablet Take 1 tablet (7.5 mg total) by mouth daily. 06/29/14  Yes Marin Olp, MD  Dextromethorphan-Quinidine (NUEDEXTA) 20-10 MG CAPS Take 1 capsule by mouth 2 (two) times daily.   Yes Historical Provider, MD  Emollient (EUCERIN) lotion Apply 1 Bottle topically 2 (two) times daily.   Yes Historical Provider, MD  escitalopram (LEXAPRO) 5 MG tablet Take 5 mg by mouth daily. For depression   Yes Historical Provider, MD  felodipine (PLENDIL) 10 MG 24 hr tablet Take 10 mg by mouth daily.   Yes Historical Provider, MD  hydrALAZINE (APRESOLINE) 50 MG tablet take 1 tablet by mouth three times a day 01/30/15  Yes Marin Olp, MD  levETIRAcetam (KEPPRA) 250 MG tablet Take 1 tablet (250 mg total) by mouth 2 (two) times daily. 06/21/15  Yes Marin Olp, MD  mirtazapine (REMERON) 15 MG tablet Take 7.5 mg by mouth at bedtime.  Yes Historical Provider, MD  olmesartan (BENICAR) 40 MG tablet Take 40 mg by mouth daily.   Yes Historical Provider, MD  Skin Protectants, Misc. (CALAZIME SKIN PROTECTANT) PSTE Apply 1 application topically 2 (two) times daily.   Yes Historical Provider, MD  acetaminophen (TYLENOL) 500 MG tablet Take 2 tablets (1,000 mg total) by mouth every 6 (six) hours as needed. 05/30/16   Charlesetta Shanks, MD    Family History Family History  Problem Relation Age of Onset  . Heart attack Father   . Heart disease Father   . Cancer Sister      Social History Social History  Substance Use Topics  . Smoking status: Never Smoker  . Smokeless tobacco: Never Used  . Alcohol use No     Allergies   Patient has no known allergies.   Review of Systems Review of Systems  Cannot obtain level V caveat dementia. Physical Exam Updated Vital Signs BP 156/98   Pulse 72   Temp 98.8 F (37.1 C) (Oral)   Resp 18   Ht 5\' 5"  (1.651 m)   Wt 130 lb (59 kg)   SpO2 99%   BMI 21.63 kg/m   Physical Exam  Constitutional:  Patient is alert and watching television but he is debilitated appearing. No respiratory distress.  HENT:  Head: Normocephalic and atraumatic.  Eyes: EOM are normal.  Neck:  Patient does not endorse cervical spine tenderness to palpation.  Cardiovascular: Normal rate, regular rhythm, normal heart sounds and intact distal pulses.   Pulmonary/Chest: Effort normal and breath sounds normal.  Abdominal: Soft. He exhibits no distension. There is no tenderness. There is no guarding.  Musculoskeletal:  Patient endorses some tenderness to direct palpation over the right trochanter. He however will put it through range of motion flexing at the hip and extending. Patient can hold the right extremity in flexion off the bed. Left lower extremity normal range of motion without pain. Upper extremities have been put through range of motion without pain.  Neurological:  Patient is alert. He does however seem to have advanced dementia. He speaks in short responses. Responses are fairly appropriate but he doesn't have much recall for additional historical information  he assist and following commands for range of motion of the extremities.  Skin: Skin is warm and dry.  Psychiatric: He has a normal mood and affect.     ED Treatments / Results  Labs (all labs ordered are listed, but only abnormal results are displayed) Labs Reviewed - No data to display  EKG  EKG Interpretation None       Radiology Dg Hip Unilat With  Pelvis 2-3 Views Right  Result Date: 05/30/2016 CLINICAL DATA:  Status post fall, with right hip pain. Initial encounter. EXAM: DG HIP (WITH OR WITHOUT PELVIS) 2-3V RIGHT COMPARISON:  None. FINDINGS: There is no evidence of fracture or dislocation. Both femoral heads are seated normally within their respective acetabula. The proximal right femur appears intact. Mild degenerative change is noted at the lower lumbar spine. The sacroiliac joints are unremarkable in appearance. The visualized bowel gas pattern is grossly unremarkable in appearance. Scattered phleboliths are noted within the pelvis. IMPRESSION: No evidence of fracture or dislocation. Electronically Signed   By: Garald Balding [name]   On: 05/30/2016 19:31    Procedures Procedures (including critical care time)  Medications Ordered in ED Medications  acetaminophen (TYLENOL) tablet 1,000 mg (not administered)     Initial Impression / Assessment and Plan / ED Course  I have reviewed the triage vital signs and the nursing notes.  Pertinent labs & imaging results that were available during my care of the patient were reviewed by me and considered in my medical decision making (see chart for details).      Final Clinical Impressions(s) / ED Diagnoses   Final diagnoses:  Fall, initial encounter  Contusion of right hip, initial encounter   Patient presents from nursing home facility with reported fall. Localizing pain seems to be the patient's right hip. X-rays do not show any fracture. Patient will spontaneously put the hip through flexion and extension range of motion. I do not identify or other areas of apparent injury. At this time the patient be returned to nursing home care for ongoing observation any signs of other injury or change from baseline function.  At the time of discharge, nursing staff called to give report to the nursing home. At that time, nursing home reported they couldn't take the patient back because his blood  pressure was elevated. Subsequently they expressed concerns believing the patient needed a CT scan before coming back. I then got on the phone with the nurse giving this report. She identified her self as being the supervisor and her name was Judeen Hammans. The only history that he came to [place] initially was that the patient had right hip pain. No other history was provided. I asked Judeen Hammans why she thought he needed a CT scan and she reported that he fell and hit his head, his right shoulder and his right hip. However, after having her review their documentation (because she was not there at there time) she does report that the fall was unwitnessed. She reports they always assumed that the patient hit their head and that's why she reported to me as such even though this was an unwitnessed fall in the nursing home. She reported to me that the patient reportedly had been crying and very tearful at the time. We have not seen any tearfulness or expression of pain while he is been in the emergency department. She then expressed to me that she was concerned to take the patient back because he has a seizure disorder and he might have a seizure. I advised that the patient will always be at risk for seizure with a seizure disorder and if they continue to give his medications as instructed that is the most they can do. Patient has been given his evening hydralazine dose here in the emergency department. They're instructed to continue to monitor the patient in the nursing home for any change to baseline function. If new or localizing concerns are identified, patient can return for further evaluation or be evaluated by his primary care provider. New Prescriptions New Prescriptions   ACETAMINOPHEN (TYLENOL) 500 MG TABLET    Take 2 tablets (1,000 mg total) by mouth every 6 (six) hours as needed.     Charlesetta Shanks, MD 05/30/16 2022    Charlesetta Shanks, MD 05/30/16 2146

## 2016-06-04 DIAGNOSIS — R531 Weakness: Secondary | ICD-10-CM | POA: Diagnosis not present

## 2016-06-11 NOTE — Progress Notes (Signed)
Location:   [facility]    Place of Service:  SNF (31)   CODE STATUS: full code   No Known Allergies  Chief Complaint  Patient presents with  . Medical Management of Chronic Issues    HPI:  He is a long term resident of this facility being seen for the management of his chronic  illnesses. He has been losing weight from 142 pounds in Dec to his current weight of 126 pounds. He is unable to fully participate in the hpi or ros; but did deny pain. There are no nursing concerns at this time.   Past Medical History:  Diagnosis Date  . Aortic insufficiency 07/04/2011   2/13 Study Conclusions  - Left ventricle: Systolic function was normal. The estimated ejection fraction was in the range of 55% to 60%. - Aortic valve: Moderate regurgitation. - Aorta: The aorta was moderately dilated. Transthoracic echocardiography. M-mode, complete 2D, spectral Doppler, and color Doppler. Height: Height: 170.2cm. Height: 67in. Weight: Weight: 84.1kg. Weight: 185lb. Body mass index: BMI: 29kg/m^2. Body surface area: BSA: 2.52m^2. Blood pressure: 175/86. Patient status: Inpatient. Location: Bedside.      . Aortic root dilatation (HCC)    Noted on 09/2010 echo, with mild-mod AI  . CEREBROVASCULAR ACCIDENT, HX OF 02/09/2007  . DEMENTIA 05/12/2007  . Hemorrhoids   . HYPERGLYCEMIA 09/21/2009  . HYPERTENSION 12/29/2006  . Lactose intolerance   . OBSTRUCTIVE SLEEP APNEA 12/29/2006  . PROSTATE CANCER, HX OF 12/29/2006  . Prostatitis   . Risk for falls   . Seizures ([place])     Past Surgical History:  Procedure Laterality Date  . CHOLECYSTECTOMY    . GANGLION CYST EXCISION    . PROSTATE SURGERY     cryotherapy    Social History   Social History  . Marital status: Married    Spouse name: N/A  . Number of children: 2  . Years of education: master's   Occupational History  . retired    Social History Main Topics  . Smoking status: Never Smoker  . Smokeless tobacco: Never Used  . Alcohol use No  . Drug  use: No  . Sexual activity: Not on file   Other Topics Concern  . Not on file   Social History Narrative   Married 52 years in 2016 to wife [name] (goes to [place] urgent care). 2 grown sons. 2 grandkids-boy and girl.       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       Patient does not drink caffeine.   Patient is right handed.   Family History  Problem Relation Age of Onset  . Heart attack Father   . Heart disease Father   . Cancer Sister       VITAL SIGNS BP 134/78   Pulse 76   Temp 97.7 F (36.5 C)   Resp 18   Ht 5\' 7"  (1.702 m)   Wt 126 lb (57.2 kg)   SpO2 97%   BMI 19.73 kg/m   Patient's Medications  New Prescriptions   No medications on file  Previous Medications   ACETAMINOPHEN (TYLENOL) 500 MG TABLET    Take 2 tablets (1,000 mg total) by mouth every 6 (six) hours as needed.   BETAMETHASONE DIPROPIONATE (DIPROLENE) 0.05 % CREAM    Apply 1 application topically daily.   CLOPIDOGREL (PLAVIX) 75 MG TABLET    take 1 tablet by mouth once daily   DARIFENACIN (ENABLEX) 7.5 MG 24 HR TABLET    Take  1 tablet (7.5 mg total) by mouth daily.   DEXTROMETHORPHAN-QUINIDINE (NUEDEXTA) 20-10 MG CAPS    Take 1 capsule by mouth 2 (two) times daily.   EMOLLIENT (EUCERIN) LOTION    Apply 1 Bottle topically 2 (two) times daily.   ESCITALOPRAM (LEXAPRO) 5 MG TABLET    Take 5 mg by mouth daily. For depression   FELODIPINE (PLENDIL) 10 MG 24 HR TABLET    Take 10 mg by mouth daily.   HYDRALAZINE (APRESOLINE) 50 MG TABLET    take 1 tablet by mouth three times a day   LEVETIRACETAM (KEPPRA) 250 MG TABLET    Take 1 tablet (250 mg total) by mouth 2 (two) times daily.   OLMESARTAN (BENICAR) 40 MG TABLET    Take 40 mg by mouth daily.   SKIN PROTECTANTS, MISC. (CALAZIME SKIN PROTECTANT) PSTE    Apply 1 application topically 2 (two) times daily.  Modified Medications   No medications on file  Discontinued Medications   No medications on file     SIGNIFICANT DIAGNOSTIC  EXAMS   LABS REVIEWED:   07-05-15: wbc 6.6; hgb 13.8; hct 41.3; mcv 85.2; plt 236; glucose 86; bun 13.4; creat 0.92; k+ 4.1; na++144; liver normal albumin 3.5  12-29-15: wbc 4.7; hgb 10.9; hct 31.9; mcv 82.5; plt 182; glucose 82; bun 18.8; creat 0.97; k+ 3.8; na++ 147  03-19-16: wbc 6.4; hgb 12.0; hct 37.2; mcv 89.7 ;plt 210; glucose 78; bun 19.5; creat 0.93; k+ 4.1; na++ 148    Review of Systems  Unable to perform ROS: dementia    Physical Exam  Constitutional: No distress.  Eyes: Conjunctivae are normal.  Neck: Neck supple. No JVD present. No thyromegaly present.  Cardiovascular: Normal rate, regular rhythm and intact distal pulses.   Respiratory: Effort normal and breath sounds normal. No respiratory distress. He has no wheezes.  GI: Soft. Bowel sounds are normal. He exhibits no distension. There is no tenderness.  Musculoskeletal: He exhibits no edema.  Able to move all extremities   Lymphadenopathy:    He has no cervical adenopathy.  Neurological: He is alert.  Skin: Skin is warm and dry. He is not diaphoretic.  Psychiatric: He has a normal mood and affect.     ASSESSMENT/ PLAN:  1. Hypertension: has aortic insufficiency   will continue benicar 20 mg daily; apresoline  50 mg three times daily  plendil  10 mg daily   2. Seizure: no reports of seizure present will continue keppra 250 mg twice daily   3. UI: will stop enablex I not certain that this medication is providing him with any benefit.   4. CVA: is neurologically stable will continue plavix 75 mg daily   5. Alzheimer's disease: is without significant change in status; is presently not on medications; will not make changes at this time;his current weight is 126 pounds; his weight in Dec 2017 was 142 pounds. Will begin remeron 7.5 mg nightly for 30 days to help stimulate appetite.  will monitor   6. PBA: will continue nuedexta 20-10 mg twice daily and will monitor   7. Depression: will continue lexapro 5 mg daily     Will check cmp Will setup a palliative care consult for weight loss with Alzheimer's disease    Ok Edwards NP [facility]  Contact [phone] Monday through Friday 8am- 5pm  After hours call [phone]

## 2016-06-18 DIAGNOSIS — R400 Somnolence: Secondary | ICD-10-CM | POA: Diagnosis not present

## 2016-06-24 ENCOUNTER — Encounter: Payer: Self-pay | Admitting: Adult Health

## 2016-06-24 ENCOUNTER — Non-Acute Institutional Stay (SKILLED_NURSING_FACILITY): Payer: Medicare Other | Admitting: Adult Health

## 2016-06-24 DIAGNOSIS — Z8673 Personal history of transient ischemic attack (TIA), and cerebral infarction without residual deficits: Secondary | ICD-10-CM | POA: Diagnosis not present

## 2016-06-24 DIAGNOSIS — Z8546 Personal history of malignant neoplasm of prostate: Secondary | ICD-10-CM

## 2016-06-24 DIAGNOSIS — R569 Unspecified convulsions: Secondary | ICD-10-CM | POA: Diagnosis not present

## 2016-06-24 DIAGNOSIS — N3281 Overactive bladder: Secondary | ICD-10-CM | POA: Diagnosis not present

## 2016-06-24 DIAGNOSIS — I10 Essential (primary) hypertension: Secondary | ICD-10-CM

## 2016-06-24 DIAGNOSIS — F482 Pseudobulbar affect: Secondary | ICD-10-CM

## 2016-06-24 NOTE — Progress Notes (Signed)
Location:   [place] Room Number: 107 A Place of Service:  SNF (31)   CODE STATUS: Full Code  No Known Allergies  Chief Complaint  Patient presents with  . Medical Management of Chronic Issues    Routine follow up    HPI:  He is a long term resident of this facility being seen for the management of his chronic illnesses. Overall his status is stable. There are no reports from nursing that he has noticed coming off enablex. He is unable to fully participate in the hpi or ros. There are no nursing concerns at this time.    Past Medical History:  Diagnosis Date  . Aortic insufficiency 07/04/2011   2/13 Study Conclusions  - Left ventricle: Systolic function was normal. The estimated ejection fraction was in the range of 55% to 60%. - Aortic valve: Moderate regurgitation. - Aorta: The aorta was moderately dilated. Transthoracic echocardiography. M-mode, complete 2D, spectral Doppler, and color Doppler. Height: Height: 170.2cm. Height: 67in. Weight: Weight: 84.1kg. Weight: 185lb. Body mass index: BMI: 29kg/m^2. Body surface area: BSA: 2.92m^2. Blood pressure: 175/86. Patient status: Inpatient. Location: Bedside.      . Aortic root dilatation (HCC)    Noted on 09/2010 echo, with mild-mod AI  . CEREBROVASCULAR ACCIDENT, HX OF 02/09/2007  . DEMENTIA 05/12/2007  . Hemorrhoids   . HYPERGLYCEMIA 09/21/2009  . HYPERTENSION 12/29/2006  . Lactose intolerance   . OBSTRUCTIVE SLEEP APNEA 12/29/2006  . PROSTATE CANCER, HX OF 12/29/2006  . Prostatitis   . Risk for falls   . Seizures ([place])     Past Surgical History:  Procedure Laterality Date  . CHOLECYSTECTOMY    . GANGLION CYST EXCISION    . PROSTATE SURGERY     cryotherapy    Social History   Social History  . Marital status: Married    Spouse name: N/A  . Number of children: 2  . Years of education: master's   Occupational History  . retired    Social History Main Topics  . Smoking status: Never Smoker  . Smokeless  tobacco: Never Used  . Alcohol use No  . Drug use: No  . Sexual activity: Not on file   Other Topics Concern  . Not on file   Social History Narrative   Married 52 years in 2016 to wife [name] (goes to [place] urgent care). 2 grown sons. 2 grandkids-boy and girl.       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       Patient does not drink caffeine.   Patient is right handed.   Family History  Problem Relation Age of Onset  . Heart attack Father   . Heart disease Father   . Cancer Sister     Vitals:   06/24/16 0925  BP: 132/74  Pulse: (!) 101  Resp: 18  Temp: 97.7 F (36.5 C)  SpO2: 97%  Weight: 130 lb (59 kg)  Height: 5\' 7"  (1.702 m)      Patient's Medications  New Prescriptions   No medications on file  Previous Medications   ACETAMINOPHEN (TYLENOL) 500 MG TABLET    Take 2 tablets (1,000 mg total) by mouth every 6 (six) hours as needed.   BETAMETHASONE DIPROPIONATE (DIPROLENE) 0.05 % CREAM    Apply 1 application topically daily.   CLOPIDOGREL (PLAVIX) 75 MG TABLET    take 1 tablet by mouth once daily   DEXTROMETHORPHAN-QUINIDINE (NUEDEXTA) 20-10 MG CAPS    Take 1 capsule by  mouth 2 (two) times daily.   EMOLLIENT (EUCERIN) LOTION    Apply 1 Bottle topically 2 (two) times daily.   ESCITALOPRAM (LEXAPRO) 5 MG TABLET    Take 5 mg by mouth daily. For depression   FELODIPINE (PLENDIL) 10 MG 24 HR TABLET    Take 10 mg by mouth daily.   HYDRALAZINE (APRESOLINE) 50 MG TABLET    take 1 tablet by mouth three times a day   LEVETIRACETAM (KEPPRA) 250 MG TABLET    Take 1 tablet (250 mg total) by mouth 2 (two) times daily.   OLMESARTAN (BENICAR) 40 MG TABLET    Take 40 mg by mouth daily.   SKIN PROTECTANTS, MISC. (CALAZIME SKIN PROTECTANT) PSTE    Apply 1 application topically 2 (two) times daily.   TRAMADOL (ULTRAM) 50 MG TABLET    Take 50 mg by mouth every 8 (eight) hours.  Modified Medications   No medications on file  Discontinued Medications   DARIFENACIN (ENABLEX)  7.5 MG 24 HR TABLET    Take 1 tablet (7.5 mg total) by mouth daily.   MIRTAZAPINE (REMERON) 15 MG TABLET    Take 7.5 mg by mouth at bedtime.     SIGNIFICANT DIAGNOSTIC EXAMS   LABS REVIEWED:   07-05-15: wbc 6.6; hgb 13.8; hct 41.3; mcv 85.2; plt 236; glucose 86; bun 13.4; creat 0.92; k+ 4.1; na++144; liver normal albumin 3.5  12-29-15: wbc 4.7; hgb 10.9; hct 31.9; mcv 82.5; plt 182; glucose 82; bun 18.8; creat 0.97; k+ 3.8; na++ 147  03-19-16: wbc 6.4; hgb 12.0; hct 37.2; mcv 89.7 ;plt 210; glucose 78; bun 19.5; creat 0.93; k+ 4.1; na++ 148    Review of Systems  Unable to perform ROS: dementia    Physical Exam  Constitutional: No distress.  Eyes: Conjunctivae are normal.  Neck: Neck supple. No JVD present. No thyromegaly present.  Cardiovascular: Normal rate, regular rhythm and intact distal pulses.   Respiratory: Effort normal and breath sounds normal. No respiratory distress. He has no wheezes.  GI: Soft. Bowel sounds are normal. He exhibits no distension. There is no tenderness.  Musculoskeletal: He exhibits no edema.  Able to move all extremities   Lymphadenopathy:    He has no cervical adenopathy.  Neurological: He is alert.  Skin: Skin is warm and dry. He is not diaphoretic.  Psychiatric: He has a normal mood and affect.     ASSESSMENT/ PLAN:  1. Hypertension: has aortic insufficiency   will continue benicar 20 mg daily; apresoline  50 mg three times daily  plendil  10 mg daily   2. Seizure: no reports of seizure present will continue keppra 250 mg twice daily   3. CVA: is neurologically stable will continue plavix 75 mg daily   4. Alzheimer's disease: is without significant change in status; is presently not on medications; will not make changes at this time;his current weight is 130 pounds; his weight in Dec 2017 was 142 pounds. He has completed his remeron therapy.   5. PBA: will continue nuedexta 20-10 mg twice daily and will monitor   6. Depression: will  continue lexapro 5 mg daily      Ok Edwards NP [facility]  Contact [phone] Monday through Friday 8am- 5pm  After hours call [phone]

## 2016-07-22 ENCOUNTER — Encounter: Payer: Self-pay | Admitting: Adult Health

## 2016-07-22 ENCOUNTER — Non-Acute Institutional Stay (SKILLED_NURSING_FACILITY): Payer: Medicare Other | Admitting: Adult Health

## 2016-07-22 DIAGNOSIS — R569 Unspecified convulsions: Secondary | ICD-10-CM

## 2016-07-22 DIAGNOSIS — F0280 Dementia in other diseases classified elsewhere without behavioral disturbance: Secondary | ICD-10-CM | POA: Diagnosis not present

## 2016-07-22 DIAGNOSIS — F482 Pseudobulbar affect: Secondary | ICD-10-CM | POA: Diagnosis not present

## 2016-07-22 DIAGNOSIS — F32A Depression, unspecified: Secondary | ICD-10-CM

## 2016-07-22 DIAGNOSIS — F329 Major depressive disorder, single episode, unspecified: Secondary | ICD-10-CM | POA: Diagnosis not present

## 2016-07-22 DIAGNOSIS — I10 Essential (primary) hypertension: Secondary | ICD-10-CM | POA: Diagnosis not present

## 2016-07-22 DIAGNOSIS — G301 Alzheimer's disease with late onset: Secondary | ICD-10-CM

## 2016-07-22 DIAGNOSIS — Z8673 Personal history of transient ischemic attack (TIA), and cerebral infarction without residual deficits: Secondary | ICD-10-CM

## 2016-07-22 NOTE — Progress Notes (Signed)
Location:   [place] Room Number: 107 A Place of Service:  SNF (31)   CODE STATUS: Full Code  No Known Allergies  Chief Complaint  Patient presents with  . Medical Management of Chronic Issues    1 month follow up    HPI:  He is a long term resident of this facility being seen for the management of his chronic illnesses. Overall there is little change in his status. He is unable to fully participate in the hpi or ros. There are no nursing concerns at this time. His blood pressure is elevated.   Past Medical History:  Diagnosis Date  . Aortic insufficiency 07/04/2011   2/13 Study Conclusions  - Left ventricle: Systolic function was normal. The estimated ejection fraction was in the range of 55% to 60%. - Aortic valve: Moderate regurgitation. - Aorta: The aorta was moderately dilated. Transthoracic echocardiography. M-mode, complete 2D, spectral Doppler, and color Doppler. Height: Height: 170.2cm. Height: 67in. Weight: Weight: 84.1kg. Weight: 185lb. Body mass index: BMI: 29kg/m^2. Body surface area: BSA: 2.57m^2. Blood pressure: 175/86. Patient status: Inpatient. Location: Bedside.      . Aortic root dilatation (HCC)    Noted on 09/2010 echo, with mild-mod AI  . CEREBROVASCULAR ACCIDENT, HX OF 02/09/2007  . DEMENTIA 05/12/2007  . Hemorrhoids   . HYPERGLYCEMIA 09/21/2009  . HYPERTENSION 12/29/2006  . Lactose intolerance   . OBSTRUCTIVE SLEEP APNEA 12/29/2006  . PROSTATE CANCER, HX OF 12/29/2006  . Prostatitis   . Risk for falls   . Seizures ([place])     Past Surgical History:  Procedure Laterality Date  . CHOLECYSTECTOMY    . GANGLION CYST EXCISION    . PROSTATE SURGERY     cryotherapy    Social History   Social History  . Marital status: Married    Spouse name: N/A  . Number of children: 2  . Years of education: master's   Occupational History  . retired    Social History Main Topics  . Smoking status: Never Smoker  . Smokeless tobacco: Never Used  .  Alcohol use No  . Drug use: No  . Sexual activity: Not on file   Other Topics Concern  . Not on file   Social History Narrative   Married 52 years in 2016 to wife [name] (goes to [place] urgent [facility]). 2 grown sons. 2 grandkids-boy and girl.       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       Patient does not drink caffeine.   Patient is right handed.   Family History  Problem Relation Age of Onset  . Heart attack Father   . Heart disease Father   . Cancer Sister       VITAL SIGNS BP (!) 174/85   Pulse (!) 101   Temp 97.7 F (36.5 C)   Resp 18   Ht 5\' 7"  (1.702 m)   Wt 128 lb 4.8 oz (58.2 kg)   SpO2 97%   BMI 20.09 kg/m   Patient's Medications  New Prescriptions   No medications on file  Previous Medications   ACETAMINOPHEN (TYLENOL) 500 MG TABLET    Take 2 tablets (1,000 mg total) by mouth every 6 (six) hours as needed.   BETAMETHASONE DIPROPIONATE (DIPROLENE) 0.05 % CREAM    Apply 1 application topically daily.   CLOPIDOGREL (PLAVIX) 75 MG TABLET    take 1 tablet by mouth once daily   DEXTROMETHORPHAN-QUINIDINE (NUEDEXTA) 20-10 MG CAPS  Take 1 capsule by mouth 2 (two) times daily.   EMOLLIENT (EUCERIN) LOTION    Apply 1 Bottle topically 2 (two) times daily.   ESCITALOPRAM (LEXAPRO) 5 MG TABLET    Take 5 mg by mouth daily. For depression   FELODIPINE (PLENDIL) 10 MG 24 HR TABLET    Take 10 mg by mouth daily.   HYDRALAZINE (APRESOLINE) 50 MG TABLET    take 1 tablet by mouth three times a day   LEVETIRACETAM (KEPPRA) 250 MG TABLET    Take 1 tablet (250 mg total) by mouth 2 (two) times daily.   OLMESARTAN (BENICAR) 40 MG TABLET    Take 40 mg by mouth daily.   SKIN PROTECTANTS, MISC. (CALAZIME SKIN PROTECTANT) PSTE    Apply 1 application topically 2 (two) times daily.   TRAMADOL (ULTRAM) 50 MG TABLET    Take 50 mg by mouth every 8 (eight) hours.  Modified Medications   No medications on file  Discontinued Medications   No medications on file      SIGNIFICANT DIAGNOSTIC EXAMS  LABS REVIEWED:   07-05-15: wbc 6.6; hgb 13.8; hct 41.3; mcv 85.2; plt 236; glucose 86; bun 13.4; creat 0.92; k+ 4.1; na++144; liver normal albumin 3.5  12-29-15: wbc 4.7; hgb 10.9; hct 31.9; mcv 82.5; plt 182; glucose 82; bun 18.8; creat 0.97; k+ 3.8; na++ 147  03-19-16: wbc 6.4; hgb 12.0; hct 37.2; mcv 89.7 ;plt 210; glucose 78; bun 19.5; creat 0.93; k+ 4.1; na++ 148    Review of Systems  Unable to perform ROS: dementia    Physical Exam  Constitutional: No distress.  Eyes: Conjunctivae are normal.  Neck: Neck supple. No JVD present. No thyromegaly present.  Cardiovascular: Normal rate, regular rhythm and intact distal pulses.   Respiratory: Effort normal and breath sounds normal. No respiratory distress. He has no wheezes.  GI: Soft. Bowel sounds are normal. He exhibits no distension. There is no tenderness.  Musculoskeletal: He exhibits no edema.  Able to move all extremities   Lymphadenopathy:    He has no cervical adenopathy.  Neurological: He is alert.  Skin: Skin is warm and dry. He is not diaphoretic.  Psychiatric: He has a normal mood and affect.     ASSESSMENT/ PLAN:  1. Hypertension: has aortic insufficiency   will continue benicar 20 mg daily;  plendil  10 mg daily will increase apresoline to 75 mg three times daily   2. Seizure: no reports of seizure present will continue keppra 250 mg twice daily   3. CVA: is neurologically stable will continue plavix 75 mg daily   4. Alzheimer's disease: is without significant change in status; is presently not on medications; will not make changes at this time;his current weight is 128 pounds; his weight in Dec 2017 was 142 pounds. He has completed his remeron therapy.   5. PBA: will continue nuedexta 20-10 mg twice daily and will monitor   6. Depression: will continue lexapro 5 mg daily    Will check cbc; cmp     MD is aware of resident's narcotic use and is in agreement with current  plan of care. We will attempt to wean resident as apropriate   Ok Edwards NP [facility]  Contact [phone] Monday through Friday 8am- 5pm  After hours call [phone]

## 2016-07-23 LAB — HEPATIC FUNCTION PANEL
ALK PHOS: 87 U/L (ref 25–125)
ALT: 9 U/L — AB (ref 10–40)
AST: 13 U/L — AB (ref 14–40)
Bilirubin, Total: 0.4 mg/dL

## 2016-07-23 LAB — CBC AND DIFFERENTIAL
HCT: 37 % — AB (ref 41–53)
Hemoglobin: 12.4 g/dL — AB (ref 13.5–17.5)
Neutrophils Absolute: 3 /uL
PLATELETS: 181 10*3/uL (ref 150–399)
WBC: 4.9 10^3/mL

## 2016-07-23 LAB — BASIC METABOLIC PANEL
BUN: 16 mg/dL (ref 4–21)
CREATININE: 0.8 mg/dL (ref 0.6–1.3)
Glucose: 81 mg/dL
POTASSIUM: 4 mmol/L (ref 3.4–5.3)
Sodium: 143 mmol/L (ref 137–147)

## 2016-08-19 ENCOUNTER — Non-Acute Institutional Stay (SKILLED_NURSING_FACILITY): Payer: Medicare Other | Admitting: Internal Medicine

## 2016-08-19 ENCOUNTER — Encounter: Payer: Self-pay | Admitting: Internal Medicine

## 2016-08-19 DIAGNOSIS — R569 Unspecified convulsions: Secondary | ICD-10-CM

## 2016-08-19 DIAGNOSIS — I10 Essential (primary) hypertension: Secondary | ICD-10-CM

## 2016-08-19 DIAGNOSIS — G301 Alzheimer's disease with late onset: Secondary | ICD-10-CM | POA: Diagnosis not present

## 2016-08-19 DIAGNOSIS — Z8673 Personal history of transient ischemic attack (TIA), and cerebral infarction without residual deficits: Secondary | ICD-10-CM | POA: Diagnosis not present

## 2016-08-19 DIAGNOSIS — F0280 Dementia in other diseases classified elsewhere without behavioral disturbance: Secondary | ICD-10-CM | POA: Diagnosis not present

## 2016-08-19 NOTE — Progress Notes (Signed)
Location:   [place] Room Number: 107/A Place of Service:  SNF [phone]) Provider:  Gabriel Rainwater, Brayton Layman, DO  Patient Care Team: [name], DO as PCP - General (Internal Medicine)  Extended Emergency Contact Information Primary Emergency Contact: Divelbiss,[name] Address: [address]          [place],  [address] [place] Phone: [phone] Relation: Spouse  Code Status:  MOST Goals of care: Advanced Directive information Advanced Directives 08/19/2016  Does Patient Have a Medical Advance Directive? Yes  Type of Advance Directive Out of facility DNR (pink MOST or yellow form)  Does patient want to make changes to medical advance directive? No - Patient declined  Copy of [place] in Chart? -  Would patient like information on creating a medical advance directive? -  Pre-existing out of facility DNR order (yellow form or pink MOST form) -     Chief Complaint  Patient presents with  . Medical Management of Chronic Issues    Routine Visit   Her medical mammogram her chronic medical issues including hypertension-seizure disorder history CVA dementia CVA and depression HPI:  Pt is a 83 y.o. male seen today for medical management of chronic diseases. As noted above-he appears to be doing relatively well stable weight is stable nursing staff does not report any recent acute concerns-no recent history of seizures to my knowledge-he appears to be doing well with supportive care but continues to be quite frail with progressive dementia.  He does have a history of hypertension he is on Benicar 20 mg a day Plendil 10 mg a day and hydralazine 75 mg 3 times a day manual blood pressure today was 162/76  Other than that vital signs are stable he has no complaints again he is a poor historian secondary to dementia and speaks minimally     Past Medical History:  Diagnosis Date  . Aortic insufficiency  07/04/2011   2/13 Study Conclusions  - Left ventricle: Systolic function was normal. The estimated ejection fraction was in the range of 55% to 60%. - Aortic valve: Moderate regurgitation. - Aorta: The aorta was moderately dilated. Transthoracic echocardiography. M-mode, complete 2D, spectral Doppler, and color Doppler. Height: Height: 170.2cm. Height: 67in. Weight: Weight: 84.1kg. Weight: 185lb. Body mass index: BMI: 29kg/m^2. Body surface area: BSA: 2.66m^2. Blood pressure: 175/86. Patient status: Inpatient. Location: Bedside.      . Aortic root dilatation (HCC)    Noted on 09/2010 echo, with mild-mod AI  . CEREBROVASCULAR ACCIDENT, HX OF 02/09/2007  . DEMENTIA 05/12/2007  . Hemorrhoids   . HYPERGLYCEMIA 09/21/2009  . HYPERTENSION 12/29/2006  . Lactose intolerance   . OBSTRUCTIVE SLEEP APNEA 12/29/2006  . PROSTATE CANCER, HX OF 12/29/2006  . Prostatitis   . Risk for falls   . Seizures ([place])    Past Surgical History:  Procedure Laterality Date  . CHOLECYSTECTOMY    . GANGLION CYST EXCISION    . PROSTATE SURGERY     cryotherapy    No Known Allergies  Allergies as of 08/19/2016   No Known Allergies     Medication List       Accurate as of 08/19/16  3:12 PM. Always use your most recent med list.          acetaminophen 500 MG tablet Commonly known as:  TYLENOL Take 2 tablets (1,000 mg total) by mouth every 6 (six) hours as needed.   betamethasone dipropionate 0.05 % cream Commonly known  as:  DIPROLENE Apply 1 application topically daily.   CALAZIME SKIN PROTECTANT Pste Apply 1 application topically 3 (three) times daily.   clopidogrel 75 MG tablet Commonly known as:  PLAVIX take 1 tablet by mouth once daily   escitalopram 5 MG tablet Commonly known as:  LEXAPRO Take 5 mg by mouth daily. For depression   eucerin lotion Apply 1 Bottle topically 2 (two) times daily.   felodipine 10 MG 24 hr tablet Commonly known as:  PLENDIL Take 10 mg by mouth daily.   hydrALAZINE 50 MG  tablet Commonly known as:  APRESOLINE take 1 tablet by mouth three times a day   levETIRAcetam 250 MG tablet Commonly known as:  KEPPRA Take 1 tablet (250 mg total) by mouth 2 (two) times daily.   NUEDEXTA 20-10 MG Caps Generic drug:  Dextromethorphan-Quinidine Take 1 capsule by mouth 2 (two) times daily.   olmesartan 40 MG tablet Commonly known as:  BENICAR Take 40 mg by mouth daily.   traMADol 50 MG tablet Commonly known as:  ULTRAM Take 50 mg by mouth every 8 (eight) hours.     Of note she is on hydralazine 75 mg twice a day currently.  She is also on Benicar 40 mg daily-  Review of Systems   Very limited secondary to dementia please see history of present illness  Immunization History  Administered Date(s) Administered  . Influenza Split 01/22/2011, 01/15/2012  . Influenza Whole 04/04/2006, 05/12/2007, 01/04/2010  . Influenza,inj,Quad PF,36+ Mos 02/01/2013, 02/14/2014, 01/11/2015  . Influenza-Unspecified 02/10/2016  . PPD Test 05/22/2015, 01/11/2016, 01/18/2016  . Pneumococcal Conjugate-13 01/11/2015  . Pneumococcal Polysaccharide-23 05/12/2007  . Td 09/23/1998   Pertinent  Health Maintenance Due  Topic Date Due  . INFLUENZA VACCINE  12/03/2016  . PNA vac Low Risk Adult  Completed   Fall Risk  06/29/2014 02/14/2014 02/01/2013  Falls in the past year? No No No  Risk for fall due to : - - Impaired mobility;Impaired balance/gait   Functional Status Survey:    Vitals:   08/19/16 1437  BP: 110/84  Pulse: 76  Resp: 16  Temp: 97.6 F (36.4 C)  TempSrc: Oral  SpO2: 97%  Weight is 128 pounds this appears to be stable Of note he has somewhat variable blood pressures I got 162/76 this afternoon-I see recent systolics ranging from 564 up to 174- Physical Exam Constitutional: No distress. Lying comfortably in bed Eyes: Conjunctivae are normal.  Neck: Neck supple. No JVD present. No thyromegaly present.  Cardiovascular: Normal rate, regular rhythm and intact  distal pulses.   Respiratory: Effort normal and breath sounds normal. No respiratory distress. He has no wheezes.  GI: Soft. Bowel sounds are normal. He exhibits no distension. There is no tenderness.  Musculoskeletal: He exhibits no edema.  Able to move all extremities   with lower extremity weakness  Lymphadenopathy:    He has no cervical adenopathy.  Neurological: He is alert. Moves all extremities 4 but is quite weak especially lower extremities  Skin: Skin is warm and dry. He is not diaphoretic.  Psychiatric: He has a normal mood and affect.   Labs reviewed:  Recent Labs  12/24/15 0353 12/25/15 0327 12/26/15 0406  02/27/16 03/19/16 07/23/16  NA 143 141 141  < > 152* 148* 143  K 3.4* 3.3* 4.1  < > 3.7 4.1 4.0  CL 109 106 110  --   --   --   --   CO2 27 26 26   --   --   --   --  GLUCOSE 77 74 119*  --   --   --   --   BUN 15 11 14   < > 17 20 16   CREATININE 1.03 0.92 1.04  < > 1.1 0.9 0.8  CALCIUM 8.7* 8.8* 8.5*  --   --   --   --   < > = values in this interval not displayed.  Recent Labs  12/23/15 1320 12/24/15 0353 07/23/16  AST 18 17 13*  ALT 14* 13* 9*  ALKPHOS 83 68 87  BILITOT 0.8 0.5  --   PROT 7.1 6.0*  --   ALBUMIN 3.2* 2.8*  --     Recent Labs  12/23/15 1320  12/24/15 0353 12/25/15 0327  02/20/16 03/19/16 07/23/16  WBC 17.4*  --  7.9 6.7  < > 6.1 6.4 4.9  NEUTROABS 13.5*  --   --   --   --   --   --  3  HGB 12.8*  < > 11.7* 11.9*  < > 11.9* 12.0* 12.4*  HCT 39.9  < > 34.6* 36.8*  < > 37* 37* 37*  MCV 88.5  --  85.0 86.2  --   --   --   --   PLT 180  --  156 185  < > 212 210 181  < > = values in this interval not displayed. Lab Results  Component Value Date   TSH 1.804 09/23/2014   Lab Results  Component Value Date   HGBA1C 5.5 12/24/2015   Lab Results  Component Value Date   CHOL 104 12/24/2015   HDL 46 12/24/2015   LDLCALC 50 12/24/2015   TRIG 42 12/24/2015   CHOLHDL 2.3 12/24/2015    Significant Diagnostic Results in last 30 days:   No results found.  Assessment/Plan    1. Hypertension: has aortic insufficiency   will continue benicar 20 mg daily;  plendil  10 mg daily -- apresoline to 75 mg three times daily --blood pressure somewhat elevated this afternoon but according nursing comes down after receiving hydralazine at this point will monitor but may need medication adjustments-if persistent  2. Seizure: no reports of seizure present will continue keppra 250 mg twice daily   3. CVA: is neurologically stable will continue plavix 75 mg daily   4. Alzheimer's disease: is without significant change in status; is presently not on medications; will not make changes at this time;his current weight is 128 pounds; his weight in Dec 2017 was 142 pounds. He has completed his remeron therapy.   5. PBA: will continue nuedexta 20-10 mg twice daily and will monitor   6. Depression: will continue lexapro 5 mg daily   [id number]

## 2016-08-20 DIAGNOSIS — R630 Anorexia: Secondary | ICD-10-CM | POA: Diagnosis not present

## 2016-08-20 DIAGNOSIS — G309 Alzheimer's disease, unspecified: Secondary | ICD-10-CM | POA: Diagnosis not present

## 2016-08-20 DIAGNOSIS — F482 Pseudobulbar affect: Secondary | ICD-10-CM | POA: Diagnosis not present

## 2016-08-20 DIAGNOSIS — F329 Major depressive disorder, single episode, unspecified: Secondary | ICD-10-CM | POA: Diagnosis not present

## 2016-09-19 ENCOUNTER — Non-Acute Institutional Stay (SKILLED_NURSING_FACILITY): Payer: Medicare Other | Admitting: Adult Health

## 2016-09-19 ENCOUNTER — Encounter: Payer: Self-pay | Admitting: Adult Health

## 2016-09-19 DIAGNOSIS — G301 Alzheimer's disease with late onset: Secondary | ICD-10-CM | POA: Diagnosis not present

## 2016-09-19 DIAGNOSIS — I10 Essential (primary) hypertension: Secondary | ICD-10-CM | POA: Diagnosis not present

## 2016-09-19 DIAGNOSIS — F482 Pseudobulbar affect: Secondary | ICD-10-CM

## 2016-09-19 DIAGNOSIS — Z8673 Personal history of transient ischemic attack (TIA), and cerebral infarction without residual deficits: Secondary | ICD-10-CM

## 2016-09-19 DIAGNOSIS — R569 Unspecified convulsions: Secondary | ICD-10-CM | POA: Diagnosis not present

## 2016-09-19 DIAGNOSIS — F0280 Dementia in other diseases classified elsewhere without behavioral disturbance: Secondary | ICD-10-CM

## 2016-09-19 NOTE — Progress Notes (Addendum)
Location:   [facility] Room Number: 107 A Place of Service:  SNF (31)   CODE STATUS: Full Code  No Known Allergies  Chief Complaint  Patient presents with  . Medical Management of Chronic Issues    1 month follow up    HPI:  He is a long term resident of this facility being seen for the management of his chronic illnesses. He has had a seizure this AM and is postictal. The seizure resolved without medical intervention. He remains lethargic.    Past Medical History:  Diagnosis Date  . Aortic insufficiency 07/04/2011   2/13 Study Conclusions  - Left ventricle: Systolic function was normal. The estimated ejection fraction was in the range of 55% to 60%. - Aortic valve: Moderate regurgitation. - Aorta: The aorta was moderately dilated. Transthoracic echocardiography. M-mode, complete 2D, spectral Doppler, and color Doppler. Height: Height: 170.2cm. Height: 67in. Weight: Weight: 84.1kg. Weight: 185lb. Body mass index: BMI: 29kg/m^2. Body surface area: BSA: 2.58m^2. Blood pressure: 175/86. Patient status: Inpatient. Location: Bedside.      . Aortic root dilatation (HCC)    Noted on 09/2010 echo, with mild-mod AI  . CEREBROVASCULAR ACCIDENT, HX OF 02/09/2007  . DEMENTIA 05/12/2007  . Hemorrhoids   . HYPERGLYCEMIA 09/21/2009  . HYPERTENSION 12/29/2006  . Lactose intolerance   . OBSTRUCTIVE SLEEP APNEA 12/29/2006  . PROSTATE CANCER, HX OF 12/29/2006  . Prostatitis   . Risk for falls   . Seizures ([place])     Past Surgical History:  Procedure Laterality Date  . CHOLECYSTECTOMY    . GANGLION CYST EXCISION    . PROSTATE SURGERY     cryotherapy    Social History   Social History  . Marital status: Married    Spouse name: N/A  . Number of children: 2  . Years of education: master's   Occupational History  . retired    Social History Main Topics  . Smoking status: Never Smoker  . Smokeless tobacco: Never Used  . Alcohol use No  . Drug use: No  . Sexual activity: Not  on file   Other Topics Concern  . Not on file   Social History Narrative   Married 52 years in 2016 to wife [name] (goes to [place] urgent care). 2 grown sons. 2 grandkids-boy and girl.       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       Patient does not drink caffeine.   Patient is right handed.   Family History  Problem Relation Age of Onset  . Heart attack Father   . Heart disease Father   . Cancer Sister       VITAL SIGNS BP 138/64   Pulse 63   Temp 98.3 F (36.8 C)   Resp 17   Ht 5\' 7"  (1.702 m)   Wt 132 lb 3.2 oz (60 kg)   SpO2 95%   BMI 20.71 kg/m   Patient's Medications  New Prescriptions   No medications on file  Previous Medications   ACETAMINOPHEN (TYLENOL) 500 MG TABLET    Take 2 tablets (1,000 mg total) by mouth every 6 (six) hours as needed.   BETAMETHASONE DIPROPIONATE (DIPROLENE) 0.05 % CREAM    Apply 1 application topically daily.   CLOPIDOGREL (PLAVIX) 75 MG TABLET    take 1 tablet by mouth once daily   DEXTROMETHORPHAN-QUINIDINE (NUEDEXTA) 20-10 MG CAPS    Take 1 capsule by mouth 2 (two) times daily.   EMOLLIENT (EUCERIN) LOTION  Apply 1 Bottle topically 2 (two) times daily.   ESCITALOPRAM (LEXAPRO) 5 MG TABLET    Take 5 mg by mouth daily. For depression   FELODIPINE (PLENDIL) 10 MG 24 HR TABLET    Take 10 mg by mouth daily.   HYDRALAZINE HCL PO    Give 75 mg by mouth three times a day for Prophylaxis   LEVETIRACETAM (KEPPRA) 250 MG TABLET    Take 1 tablet (250 mg total) by mouth 2 (two) times daily.   OLMESARTAN (BENICAR) 40 MG TABLET    Take 40 mg by mouth daily.   SKIN PROTECTANTS, MISC. (CALAZIME SKIN PROTECTANT) PSTE    Apply 1 application topically 3 (three) times daily.   Modified Medications   No medications on file  Discontinued Medications   HYDRALAZINE (APRESOLINE) 50 MG TABLET    take 1 tablet by mouth three times a day   TRAMADOL (ULTRAM) 50 MG TABLET    Take 50 mg by mouth every 8 (eight) hours.     SIGNIFICANT  DIAGNOSTIC EXAMS  LABS REVIEWED:   12-29-15: wbc 4.7; hgb 10.9; hct 31.9; mcv 82.5; plt 182; glucose 82; bun 18.8; creat 0.97; k+ 3.8; na++ 147  03-19-16: wbc 6.4; hgb 12.0; hct 37.2; mcv 89.7 ;plt 210; glucose 78; bun 19.5; creat 0.93; k+ 4.1; na++ 148  07-23-16: wbc 4.9; hgb 12.4; hct 36.8; mcv 89.;6 plt 181; glucose 81; bun 15.6; creat 0.77; k+ 4.0; na++ 143; ca 8.9; liver normal albumin 3.7    Review of Systems  Unable to perform ROS: dementia    Physical Exam  Constitutional: No distress.  Eyes: Conjunctivae are normal.  Neck: Neck supple. No JVD present. No thyromegaly present.  Cardiovascular: Normal rate, regular rhythm and intact distal pulses.   Respiratory: Effort normal and breath sounds normal. No respiratory distress. He has no wheezes.  GI: Soft. Bowel sounds are normal. He exhibits no distension. There is no tenderness.  Musculoskeletal: He exhibits no edema.  Able to move all extremities   Lymphadenopathy:    He has no cervical adenopathy.  Neurological: lethargic   Skin: Skin is warm and dry. He is not diaphoretic.  Psychiatric: lethargic     ASSESSMENT/ PLAN:  1. Hypertension: has aortic insufficiency b/p 138/64   will continue benicar 20 mg daily;  plendil  10 mg daily will increase apresoline to 75 mg three times daily   2. Seizure: has seizure this AM: will increase keppra to 500 mg twice daily    3. CVA: is neurologically stable will continue plavix 75 mg daily   4. Alzheimer's disease: is without significant change in status; is presently not on medications; will not make changes at this time;his current weight is 132 pounds; his weight in Dec 2017 was 142 pounds. He has completed his remeron therapy.   5. PBA: will continue nuedexta 20-10 mg twice daily and will monitor   6. Depression: will continue lexapro 5 mg daily   7. history of prostate cancer: is status post cryotherapy (06/2014): with [facility]    Will check cbc; cmp    MD is  aware of resident's narcotic use and is in agreement with current plan of care. We will attempt to wean resident as apropriate    Ok Edwards NP [facility]  Contact [phone] Monday through Friday 8am- 5pm  After hours call [phone]

## 2016-10-07 DIAGNOSIS — R531 Weakness: Secondary | ICD-10-CM | POA: Diagnosis not present

## 2016-10-16 ENCOUNTER — Non-Acute Institutional Stay (SKILLED_NURSING_FACILITY): Payer: Medicare Other | Admitting: Internal Medicine

## 2016-10-16 ENCOUNTER — Encounter: Payer: Self-pay | Admitting: Internal Medicine

## 2016-10-16 DIAGNOSIS — I10 Essential (primary) hypertension: Secondary | ICD-10-CM

## 2016-10-16 DIAGNOSIS — R569 Unspecified convulsions: Secondary | ICD-10-CM | POA: Diagnosis not present

## 2016-10-16 DIAGNOSIS — F482 Pseudobulbar affect: Secondary | ICD-10-CM

## 2016-10-16 DIAGNOSIS — F329 Major depressive disorder, single episode, unspecified: Secondary | ICD-10-CM | POA: Diagnosis not present

## 2016-10-16 DIAGNOSIS — F0280 Dementia in other diseases classified elsewhere without behavioral disturbance: Secondary | ICD-10-CM

## 2016-10-16 DIAGNOSIS — F32A Depression, unspecified: Secondary | ICD-10-CM

## 2016-10-16 DIAGNOSIS — G301 Alzheimer's disease with late onset: Secondary | ICD-10-CM | POA: Diagnosis not present

## 2016-10-16 DIAGNOSIS — Z8673 Personal history of transient ischemic attack (TIA), and cerebral infarction without residual deficits: Secondary | ICD-10-CM

## 2016-10-16 NOTE — Progress Notes (Signed)
DATE:  October 16, 2016  Location:   [facility] Room Number: 107 A Place of Service: SNF (31)   Extended Emergency Contact Information Primary Emergency Contact: Podolsky,[name] Address: [address]          [place], [place] [address] [place] Phone: [phone] Relation: Spouse  Advanced Directive information Does Patient Have a Medical Advance Directive?: Yes, Type of Advance Directive: Out of facility DNR (pink MOST or yellow form), Pre-existing out of facility DNR order (yellow form or pink MOST form): Pink MOST form placed in chart (order not valid for inpatient use), Does patient want to make changes to medical advance directive?: No - Patient declined  Chief Complaint  Patient presents with  . Medical Management of Chronic Issues    1 month follow up    HPI:  83 yo male long term resident seen today for f/u. He has no concerns. No nursing issues. No falls. He is a poor historian due to dementia. Hx obtained from chart. No sz's since keppra dose increase last month.  Hypertension - stable on benicar 20 mg daily; plendil  10 mg daily; apresoline 75 mg three times daily   Seizure - stable on keppra 500 mg twice daily    Hx CVA - stable on plavix 75 mg daily   Alzheimer's disease - unchanged. He does not take any meds. Weight stable   PBA - stable on nuedexta 20-10 mg twice daily  Depression - stable on lexapro 5 mg daily   history of prostate cancer - s/p cryotherapy (06/2014). Followed by [facility]     Past Medical History:  Diagnosis Date  . Aortic insufficiency 07/04/2011   2/13 Study Conclusions  - Left ventricle: Systolic function was normal. The estimated ejection fraction was in the range of 55% to 60%. - Aortic valve: Moderate regurgitation. - Aorta: The aorta was moderately dilated. Transthoracic echocardiography. M-mode, complete 2D, spectral Doppler, and color Doppler. Height: Height: 170.2cm. Height: 67in. Weight:  Weight: 84.1kg. Weight: 185lb. Body mass index: BMI: 29kg/m^2. Body surface area: BSA: 2.11m^2. Blood pressure: 175/86. Patient status: Inpatient. Location: Bedside.      . Aortic root dilatation (HCC)    Noted on 09/2010 echo, with mild-mod AI  . CEREBROVASCULAR ACCIDENT, HX OF 02/09/2007  . DEMENTIA 05/12/2007  . Hemorrhoids   . HYPERGLYCEMIA 09/21/2009  . HYPERTENSION 12/29/2006  . Lactose intolerance   . OBSTRUCTIVE SLEEP APNEA 12/29/2006  . PROSTATE CANCER, HX OF 12/29/2006  . Prostatitis   . Risk for falls   . Seizures ([place])     Past Surgical History:  Procedure Laterality Date  . CHOLECYSTECTOMY    . GANGLION CYST EXCISION    . PROSTATE SURGERY     cryotherapy    Patient Care Team: [name], DO as PCP - General (Internal Medicine)  Social History   Social History  . Marital status: Married    Spouse name: N/A  . Number of children: 2  . Years of education: master's   Occupational History  . retired    Social History Main Topics  . Smoking status: Never Smoker  . Smokeless tobacco: Never Used  . Alcohol use No  . Drug use: No  . Sexual activity: Not on file   Other Topics Concern  . Not on file   Social History Narrative   Married 52 years in 2016 to wife [name] (goes to [place] urgent care). 2 grown sons. 2 grandkids-boy and girl.  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       Patient does not drink caffeine.   Patient is right handed.     reports that he has never smoked. He has never used smokeless tobacco. He reports that he does not drink alcohol or use drugs.  Family History  Problem Relation Age of Onset  . Heart attack Father   . Heart disease Father   . Cancer Sister    Family Status  Relation Status  . Father Deceased  . Sister Deceased at age 75  . Mother Deceased at age 39       never had contact with  . Brother Pension scheme manager History  Administered Date(s) Administered  . Influenza Split 01/22/2011,  01/15/2012  . Influenza Whole 04/04/2006, 05/12/2007, 01/04/2010  . Influenza,inj,Quad PF,36+ Mos 02/01/2013, 02/14/2014, 01/11/2015  . Influenza-Unspecified 02/10/2016  . PPD Test 05/22/2015, 01/11/2016, 01/18/2016  . Pneumococcal Conjugate-13 01/11/2015  . Pneumococcal Polysaccharide-23 05/12/2007  . Td 09/23/1998    No Known Allergies  Medications: Patient's Medications  New Prescriptions   No medications on file  Previous Medications   ACETAMINOPHEN (TYLENOL) 500 MG TABLET    Take 2 tablets (1,000 mg total) by mouth every 6 (six) hours as needed.   BETAMETHASONE DIPROPIONATE (DIPROLENE) 0.05 % CREAM    Apply 1 application topically daily.   CLOPIDOGREL (PLAVIX) 75 MG TABLET    take 1 tablet by mouth once daily   DEXTROMETHORPHAN-QUINIDINE (NUEDEXTA) 20-10 MG CAPS    Take 1 capsule by mouth 2 (two) times daily.   EMOLLIENT (EUCERIN) LOTION    Apply to Dry skin topically two times daily   ESCITALOPRAM (LEXAPRO) 5 MG TABLET    Take 5 mg by mouth daily. For depression   FELODIPINE (PLENDIL) 10 MG 24 HR TABLET    Take 10 mg by mouth daily.   HYDRALAZINE HCL PO    Give 75 mg by mouth three times a day for Prophylaxis   LEVETIRACETAM (KEPPRA) 250 MG TABLET    Take 1 tablet (250 mg total) by mouth 2 (two) times daily.   NUTRITIONAL SUPPLEMENT LIQD    House supplement - Med Pass - Give 120 cc by mouth three times daily Nutritional treat - Give 4 ounces by mouth three times daily   OLMESARTAN (BENICAR) 40 MG TABLET    Take 40 mg by mouth daily.   SKIN PROTECTANTS, MISC. (CALAZIME SKIN PROTECTANT) PSTE    Apply 1 application topically 3 (three) times daily.   Modified Medications   No medications on file  Discontinued Medications   No medications on file    Review of Systems  Unable to perform ROS: Dementia (memory loss)    Vitals:   10/16/16 1146  BP: (!) 142/76  Weight: 134 lb 6.4 oz (61 kg)  Height: 5\' 7"  (1.702 m)   Body mass index is 21.05 kg/m.  Physical Exam    Constitutional: He appears well-developed.  Sitting in w/c in NAD  HENT:  Mouth/Throat: Oropharynx is clear and moist.  MMM; no oral thrush  Eyes: Pupils are equal, round, and reactive to light. No scleral icterus.  Neck: Neck supple. Carotid bruit is not present. No thyromegaly present.  Cardiovascular: Normal rate, regular rhythm and intact distal pulses.   Occasional extrasystoles are present. Exam reveals no gallop and no friction rub.   Murmur heard.  Systolic murmur is present with a grade of 1/6  No distal LE edema. No calf TTP  Pulmonary/Chest: Effort normal and breath sounds normal. He has no wheezes. He has no rales. He exhibits no tenderness.  Abdominal: Soft. Normal appearance and bowel sounds are normal. He exhibits no distension, no abdominal bruit, no pulsatile midline mass and no mass. There is no hepatomegaly. There is no tenderness. There is no rigidity, no rebound and no guarding. No hernia.  Musculoskeletal: He exhibits edema.  Lymphadenopathy:    He has no cervical adenopathy.  Neurological: He is alert.  Skin: Skin is warm and dry. No rash noted.  Psychiatric: He has a normal mood and affect. His behavior is normal.     Labs reviewed: Nursing Home on 07/22/2016  Component Date Value Ref Range Status  . Hemoglobin 07/23/2016 12.4* 13.5 - 17.5 g/dL Final  . HCT 07/23/2016 37* 41 - 53 % Final  . Neutrophils Absolute 07/23/2016 3  /L Final  . Platelets 07/23/2016 181  150 - 399 K/L Final  . WBC 07/23/2016 4.9  10^3/mL Final  . Glucose 07/23/2016 81  mg/dL Final  . BUN 07/23/2016 16  4 - 21 mg/dL Final  . Creatinine 07/23/2016 0.8  0.6 - 1.3 mg/dL Final  . Potassium 07/23/2016 4.0  3.4 - 5.3 mmol/L Final  . Sodium 07/23/2016 143  137 - 147 mmol/L Final  . Alkaline Phosphatase 07/23/2016 87  25 - 125 U/L Final  . ALT 07/23/2016 9* 10 - 40 U/L Final  . AST 07/23/2016 13* 14 - 40 U/L Final  . Bilirubin, Total 07/23/2016 0.4  mg/dL Final    No results  found.   Assessment/Plan   ICD-10-CM   1. Seizures ([place]) R56.9   2. PBA (pseudobulbar affect) F48.2   3. Essential hypertension I10   4. Late onset Alzheimer's disease without behavioral disturbance G30.1    F02.80   5. Depression, unspecified depression type F32.9   6. History of CVA (cerebrovascular accident) Z86.73     Cont current meds as ordered  PT/OT/ST as indicated  F/u with specialists as scheduled  Will follow  Mike Williams  [facility] [address] [place], [place] [address] [phone] Cell (Monday-Friday 8 AM - 5 PM) [phone] After 5 PM and follow prompts

## 2016-10-18 LAB — CBC AND DIFFERENTIAL
HCT: 37 — AB (ref 41–53)
Hemoglobin: 11.9 — AB (ref 13.5–17.5)
Neutrophils Absolute: 4
Platelets: 159 (ref 150–399)
WBC: 5.8

## 2016-10-18 LAB — HEPATIC FUNCTION PANEL
ALT: 11 (ref 10–40)
AST: 12 — AB (ref 14–40)
Alkaline Phosphatase: 114 (ref 25–125)
Bilirubin, Total: 0.3

## 2016-10-18 LAB — BASIC METABOLIC PANEL
BUN: 25 — AB (ref 4–21)
CREATININE: 0.9 (ref 0.6–1.3)
Glucose: 91
POTASSIUM: 3.9 (ref 3.4–5.3)
Sodium: 153 — AB (ref 137–147)

## 2016-10-23 ENCOUNTER — Encounter: Payer: Self-pay | Admitting: Internal Medicine

## 2016-10-23 ENCOUNTER — Non-Acute Institutional Stay (SKILLED_NURSING_FACILITY): Payer: Medicare Other | Admitting: Internal Medicine

## 2016-10-23 DIAGNOSIS — G301 Alzheimer's disease with late onset: Secondary | ICD-10-CM | POA: Diagnosis not present

## 2016-10-23 DIAGNOSIS — R569 Unspecified convulsions: Secondary | ICD-10-CM

## 2016-10-23 DIAGNOSIS — R8290 Unspecified abnormal findings in urine: Secondary | ICD-10-CM

## 2016-10-23 DIAGNOSIS — F0280 Dementia in other diseases classified elsewhere without behavioral disturbance: Secondary | ICD-10-CM | POA: Diagnosis not present

## 2016-10-23 DIAGNOSIS — E860 Dehydration: Secondary | ICD-10-CM

## 2016-10-23 DIAGNOSIS — E870 Hyperosmolality and hypernatremia: Secondary | ICD-10-CM | POA: Diagnosis not present

## 2016-10-23 LAB — BASIC METABOLIC PANEL
BUN: 20 (ref 4–21)
Creatinine: 0.9 (ref 0.6–1.3)
GLUCOSE: 131
Potassium: 3.6 (ref 3.4–5.3)
SODIUM: 150 — AB (ref 137–147)

## 2016-10-23 LAB — HEPATIC FUNCTION PANEL
ALT: 22 (ref 10–40)
AST: 24 (ref 14–40)
Alkaline Phosphatase: 122 (ref 25–125)
BILIRUBIN, TOTAL: 0.4

## 2016-10-23 LAB — CBC AND DIFFERENTIAL
HEMATOCRIT: 37 — AB (ref 41–53)
Hemoglobin: 12.8 — AB (ref 13.5–17.5)
PLATELETS: 125 — AB (ref 150–399)
WBC: 9.8

## 2016-10-23 NOTE — Progress Notes (Signed)
Patient ID: Mike Williams, male   DOB: [birth year], 83 y.o.   MRN: [id number]    DATE:  10/23/2016  Location:    [place] Room Number: 107 A Place of Service: SNF (31)   Extended Emergency Contact Information Primary Emergency Contact: Stefanik,[name] Address: [address]          [place], [place] [address] [place] Phone: [phone] Relation: Spouse  Advanced Directive information Does Patient Have a Medical Advance Directive?: Yes, Type of Advance Directive: Out of facility DNR (pink MOST or yellow form), Pre-existing out of facility DNR order (yellow form or pink MOST form): Pink MOST form placed in chart (order not valid for inpatient use), Does patient want to make changes to medical advance directive?: No - Patient declined  Chief Complaint  Patient presents with  . Acute Visit    Seizures    HPI:  83 yo male long term resident seen today for witnessed sz. Nursing noted sz lasting several secs before resolving on its own. Pt was unresponsive during the time and experienced post-ictal confusion. He became tachycardic and O2 sats dropped. Nursing suctioned him and applied O2 which helped. No tongue biting. He was started on Cipro for probable UTI yesterday. He takes keppra 500mg  BID. Appetite poor. He is a poor historian due to dementia. Hx obtained from chart.  Hypertension/hx aortic insufficiency - BP elevated on benicar 20 mg daily;  plendil  10 mg daily; apresoline 75 mg three times daily   Seizure - uncontrolled on keppra 500 mg twice daily    Hx CVA - stable on plavix 75 mg daily   Alzheimer's disease - not on any medications   PBA - stable on nuedexta 20-10 mg twice daily   Depression - stable on lexapro 5 mg daily    Past Medical History:  Diagnosis Date  . Aortic insufficiency 07/04/2011   2/13 Study Conclusions  - Left ventricle: Systolic function was normal. The estimated ejection fraction was in the range of 55% to 60%. -  Aortic valve: Moderate regurgitation. - Aorta: The aorta was moderately dilated. Transthoracic echocardiography. M-mode, complete 2D, spectral Doppler, and color Doppler. Height: Height: 170.2cm. Height: 67in. Weight: Weight: 84.1kg. Weight: 185lb. Body mass index: BMI: 29kg/m^2. Body surface area: BSA: 2.51m^2. Blood pressure: 175/86. Patient status: Inpatient. Location: Bedside.      . Aortic root dilatation (HCC)    Noted on 09/2010 echo, with mild-mod AI  . CEREBROVASCULAR ACCIDENT, HX OF 02/09/2007  . DEMENTIA 05/12/2007  . Hemorrhoids   . HYPERGLYCEMIA 09/21/2009  . HYPERTENSION 12/29/2006  . Lactose intolerance   . OBSTRUCTIVE SLEEP APNEA 12/29/2006  . PROSTATE CANCER, HX OF 12/29/2006  . Prostatitis   . Risk for falls   . Seizures ([place])     Past Surgical History:  Procedure Laterality Date  . CHOLECYSTECTOMY    . GANGLION CYST EXCISION    . PROSTATE SURGERY     cryotherapy    Patient Care Team: [name], DO as PCP - General (Internal Medicine)  Social History   Social History  . Marital status: Married    Spouse name: N/A  . Number of children: 2  . Years of education: master's   Occupational History  . retired    Social History Main Topics  . Smoking status: Never Smoker  . Smokeless tobacco: Never Used  . Alcohol use No  . Drug use: No  . Sexual activity: Not on file   Other Topics  Concern  . Not on file   Social History Narrative   Married 52 years in 2016 to wife [name] (goes to [place] urgent care). 2 grown sons. 2 grandkids-boy and girl.       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       Patient does not drink caffeine.   Patient is right handed.     reports that he has never smoked. He has never used smokeless tobacco. He reports that he does not drink alcohol or use drugs.  Family History  Problem Relation Age of Onset  . Heart attack Father   . Heart disease Father   . Cancer Sister    Family Status  Relation Status  . Father  Deceased  . Sister Deceased at age 71  . Mother Deceased at age 59       never had contact with  . Brother Pension scheme manager History  Administered Date(s) Administered  . Influenza Split 01/22/2011, 01/15/2012  . Influenza Whole 04/04/2006, 05/12/2007, 01/04/2010  . Influenza,inj,Quad PF,36+ Mos 02/01/2013, 02/14/2014, 01/11/2015  . Influenza-Unspecified 02/10/2016  . PPD Test 05/22/2015, 01/11/2016, 01/18/2016  . Pneumococcal Conjugate-13 01/11/2015  . Pneumococcal Polysaccharide-23 05/12/2007  . Td 09/23/1998    No Known Allergies  Medications: Patient's Medications  New Prescriptions   No medications on file  Previous Medications   ACETAMINOPHEN (TYLENOL) 500 MG TABLET    Take 2 tablets (1,000 mg total) by mouth every 6 (six) hours as needed.   BETAMETHASONE DIPROPIONATE (DIPROLENE) 0.05 % CREAM    Apply 1 application topically daily.   CIPROFLOXACIN (CIPRO) 500 MG TABLET    Take 500 mg by mouth 2 (two) times daily.   CLOPIDOGREL (PLAVIX) 75 MG TABLET    take 1 tablet by mouth once daily   DEXTROMETHORPHAN-QUINIDINE (NUEDEXTA) 20-10 MG CAPS    Take 1 capsule by mouth 2 (two) times daily.   EMOLLIENT (EUCERIN) LOTION    Apply to Dry skin topically two times daily   ESCITALOPRAM (LEXAPRO) 5 MG TABLET    Take 5 mg by mouth daily. For depression   FELODIPINE (PLENDIL) 10 MG 24 HR TABLET    Take 10 mg by mouth daily.   HYDRALAZINE HCL PO    Give 75 mg by mouth three times a day for Prophylaxis   LACTOBACILLUS (ACIDOPHILUS) CAPS CAPSULE    Take 1 capsule by mouth 2 (two) times daily.   LEVETIRACETAM (KEPPRA) 250 MG TABLET    Take 1 tablet (250 mg total) by mouth 2 (two) times daily.   NUTRITIONAL SUPPLEMENT LIQD    House supplement - Med Pass - Give 120 cc by mouth three times daily Nutritional treat - Give 4 ounces by mouth three times daily   OLMESARTAN (BENICAR) 40 MG TABLET    Take 40 mg by mouth daily.   SKIN PROTECTANTS, MISC. (CALAZIME SKIN PROTECTANT) PSTE    Apply 1  application topically 3 (three) times daily.   Modified Medications   No medications on file  Discontinued Medications   No medications on file    Review of Systems  Unable to perform ROS: Dementia    Vitals:   10/23/16 1552  BP: (!) 152/88  Pulse: (!) 138  Resp: (!) 22  Temp: 99.2 F (37.3 C)  TempSrc: Oral  SpO2: (!) 88%  Weight: 134 lb 6.4 oz (61 kg)   Body mass index is 21.05 kg/m.  Physical Exam  Constitutional: He appears well-developed.  Sluggish, lying in  bed in NAD; [place] O2 intact  HENT:  Mouth/Throat: Oropharynx is clear and moist.  MM dry; no oral thrush  Eyes: Pupils are equal, round, and reactive to light. No scleral icterus.  Neck: Neck supple. Carotid bruit is not present. No thyromegaly present.  Cardiovascular: Regular rhythm and intact distal pulses.  Frequent extrasystoles are present. Bradycardia present.  Exam reveals no gallop and no friction rub.   Murmur (1/6 SEM) heard.  Systolic murmur is present with a grade of 1/6  no distal LE swelling. No calf TTP  Pulmonary/Chest: Effort normal. He has decreased breath sounds. He has no wheezes. He has no rhonchi. He has no rales. He exhibits no tenderness.  Poor inspiratory effort  Abdominal: Soft. Bowel sounds are normal. He exhibits no distension, no abdominal bruit, no pulsatile midline mass and no mass. There is no hepatomegaly. There is no tenderness. There is no rebound and no guarding.  Musculoskeletal: He exhibits edema.  Lymphadenopathy:    He has no cervical adenopathy.  Neurological: He is alert.  Skin: Skin is warm and dry. No rash noted.  Psychiatric: He has a normal mood and affect. His behavior is normal.     Labs reviewed: Nursing Home on 10/23/2016  Component Date Value Ref Range Status  . Hemoglobin 10/23/2016 12.8* 13.5 - 17.5 Final  . HCT 10/23/2016 37* 41 - 53 Final  . Platelets 10/23/2016 125* 150 - 399 Final  . WBC 10/23/2016 9.8   Final  . Glucose 10/23/2016 131   Final  .  BUN 10/23/2016 20  4 - 21 Final  . Creatinine 10/23/2016 0.9  0.6 - 1.3 Final  . Potassium 10/23/2016 3.6  3.4 - 5.3 Final  . Sodium 10/23/2016 150* 137 - 147 Final  Abstract on 10/20/2016  Component Date Value Ref Range Status  . Hemoglobin 10/18/2016 11.9* 13.5 - 17.5 Final  . HCT 10/18/2016 37* 41 - 53 Final  . Neutrophils Absolute 10/18/2016 4   Final  . Platelets 10/18/2016 159  150 - 399 Final  . WBC 10/18/2016 5.8   Final  . Glucose 10/18/2016 91   Final  . BUN 10/18/2016 25* 4 - 21 Final  . Creatinine 10/18/2016 0.9  0.6 - 1.3 Final  . Potassium 10/18/2016 3.9  3.4 - 5.3 Final  . Sodium 10/18/2016 153* 137 - 147 Final  . Alkaline Phosphatase 10/18/2016 114  25 - 125 Final  . ALT 10/18/2016 11  10 - 40 Final  . AST 10/18/2016 12* 14 - 40 Final  . Bilirubin, Total 10/18/2016 0.3   Final    No results found.   Assessment/Plan   ICD-10-CM   1. Seizures ([place]) R56.9    uncontrolled  2. Abnormal urinalysis R82.90    c/w UTI  3. Hypernatremia E87.0   4. Dehydration E86.0   5. Late onset Alzheimer's disease without behavioral disturbance G30.1    F02.80     Increase keppra to 750mg  BID  D/c Cipro as it may reduce sz threshold  Start rocephin 1gm IM daily x 6 days  Start NSS @ 80 cc/hr via IV x 1 L  Repeat BMP after IVF  Cont other meds as ordered  Cont nutritional supplements as ordered  Will follow  Kaye Mitro S. Perlie Gold  [facility] [address] [place], [place] [address] [phone] Cell (Monday-Friday 8 AM - 5 PM) [phone] After 5 PM and follow prompts

## 2016-10-27 ENCOUNTER — Non-Acute Institutional Stay (SKILLED_NURSING_FACILITY): Payer: Medicare Other

## 2016-10-27 DIAGNOSIS — Z0000 Encounter for general adult medical examination without abnormal findings: Secondary | ICD-10-CM

## 2016-10-27 NOTE — Progress Notes (Signed)
Subjective:   Mike Williams is a 83 y.o. male who presents for an Initial Medicare Annual Wellness Visit at [facility]; incapacitated patient unable to answer questions appropriately      Objective:    Today's Vitals   10/27/16 0940  BP: 130/62  Pulse: 60  Temp: 97.2 F (36.2 C)  TempSrc: Oral  SpO2: 98%  Weight: 134 lb (60.8 kg)  Height: 5\' 7"  (1.702 m)   Body mass index is 20.99 kg/m.  Current Medications (verified) Outpatient Encounter Prescriptions as of 10/27/2016  Medication Sig  . acetaminophen (TYLENOL) 500 MG tablet Take 2 tablets (1,000 mg total) by mouth every 6 (six) hours as needed.  . betamethasone dipropionate (DIPROLENE) 0.05 % cream Apply 1 application topically daily.  . cefTRIAXone in lidocaine (with preservative) injection Inject 1,000 mg into the muscle daily. FOR 6 DAYS FOR UTI  . ciprofloxacin (CIPRO) 500 MG tablet Take 500 mg by mouth 2 (two) times daily.  . clopidogrel (PLAVIX) 75 MG tablet take 1 tablet by mouth once daily  . Dextromethorphan-Quinidine (NUEDEXTA) 20-10 MG CAPS Take 1 capsule by mouth 2 (two) times daily.  . Emollient (EUCERIN) lotion Apply to Dry skin topically two times daily  . escitalopram (LEXAPRO) 5 MG tablet Take 5 mg by mouth daily. For depression  . felodipine (PLENDIL) 10 MG 24 hr tablet Take 10 mg by mouth daily.  Marland Kitchen HYDRALAZINE HCL PO Give 75 mg by mouth three times a day for Prophylaxis  . Lactobacillus (ACIDOPHILUS) CAPS capsule Take 1 capsule by mouth 2 (two) times daily.  Marland Kitchen levETIRAcetam (KEPPRA) 250 MG tablet Take 1 tablet (250 mg total) by mouth 2 (two) times daily.  Marland Kitchen NUTRITIONAL SUPPLEMENT LIQD House supplement - Med Pass - Give 120 cc by mouth three times daily Nutritional treat - Give 4 ounces by mouth three times daily  . olmesartan (BENICAR) 40 MG tablet Take 40 mg by mouth daily.  . Skin Protectants, Misc. (CALAZIME SKIN PROTECTANT) PSTE Apply 1 application topically 3 (three) times daily.    No  facility-administered encounter medications on file as of 10/27/2016.     Allergies (verified) Patient has no known allergies.   History: Past Medical History:  Diagnosis Date  . Aortic insufficiency 07/04/2011   2/13 Study Conclusions  - Left ventricle: Systolic function was normal. The estimated ejection fraction was in the range of 55% to 60%. - Aortic valve: Moderate regurgitation. - Aorta: The aorta was moderately dilated. Transthoracic echocardiography. M-mode, complete 2D, spectral Doppler, and color Doppler. Height: Height: 170.2cm. Height: 67in. Weight: Weight: 84.1kg. Weight: 185lb. Body mass index: BMI: 29kg/m^2. Body surface area: BSA: 2.33m^2. Blood pressure: 175/86. Patient status: Inpatient. Location: Bedside.      . Aortic root dilatation (HCC)    Noted on 09/2010 echo, with mild-mod AI  . CEREBROVASCULAR ACCIDENT, HX OF 02/09/2007  . DEMENTIA 05/12/2007  . Hemorrhoids   . HYPERGLYCEMIA 09/21/2009  . HYPERTENSION 12/29/2006  . Lactose intolerance   . OBSTRUCTIVE SLEEP APNEA 12/29/2006  . PROSTATE CANCER, HX OF 12/29/2006  . Prostatitis   . Risk for falls   . Seizures ([place])    Past Surgical History:  Procedure Laterality Date  . CHOLECYSTECTOMY    . GANGLION CYST EXCISION    . PROSTATE SURGERY     cryotherapy   Family History  Problem Relation Age of Onset  . Heart attack Father   . Heart disease Father   . Cancer Sister    Social  History   Occupational History  . retired    Social History Main Topics  . Smoking status: Never Smoker  . Smokeless tobacco: Never Used  . Alcohol use No  . Drug use: No  . Sexual activity: Not on file   Tobacco Counseling Counseling given: Not Answered   Activities of Daily Living In your present state of health, do you have any difficulty performing the following activities: 10/27/2016 12/23/2015  Hearing? N N  Vision? N N  Difficulty concentrating or making decisions? Tempie Donning  Walking or climbing stairs? Y Y  Dressing or  bathing? Y Y  Doing errands, shopping? Tempie Donning  Preparing Food and eating ? Y -  Using the Toilet? Y -  In the past six months, have you accidently leaked urine? Y -  Do you have problems with loss of bowel control? Y -  Managing your Medications? Y -  Managing your Finances? Y -  Housekeeping or managing your Housekeeping? Y -  Some recent data might be hidden    Immunizations and Health Maintenance Immunization History  Administered Date(s) Administered  . Influenza Split 01/22/2011, 01/15/2012  . Influenza Whole 04/04/2006, 05/12/2007, 01/04/2010  . Influenza,inj,Quad PF,36+ Mos 02/01/2013, 02/14/2014, 01/11/2015  . Influenza-Unspecified 02/10/2016  . PPD Test 05/22/2015, 01/11/2016, 01/18/2016  . Pneumococcal Conjugate-13 01/11/2015  . Pneumococcal Polysaccharide-23 05/12/2007  . Td 09/23/1998   There are no preventive care reminders to display for this patient.  Patient Care Team: [name], DO as PCP - General (Internal Medicine)  Indicate any recent Medical Services you may have received from other than [facility] providers in the past year (date may be approximate).    Assessment:   This is a routine wellness examination for [name].   Hearing/Vision screen No exam data present  Dietary issues and exercise activities discussed: Current Exercise Habits: The patient does not participate in regular exercise at present, Exercise limited by: neurologic condition(s)  Goals    . Blood Pressure < 135/85      Depression Screen PHQ 2/9 Scores 10/27/2016 06/29/2014 02/01/2013  PHQ - 2 Score 0 0 2    Fall Risk Fall Risk  10/27/2016 06/29/2014 02/14/2014 02/01/2013  Falls in the past year? No No No No  Risk for fall due to : - - - Impaired mobility;Impaired balance/gait    Cognitive Function: MMSE - Mini Mental State Exam 10/27/2016 10/31/2014  Not completed: Unable to complete Unable to complete        Screening Tests Health Maintenance  Topic Date Due  . TETANUS/TDAP   07/22/2017 (Originally 09/22/2008)  . INFLUENZA VACCINE  12/03/2016  . PNA vac Low Risk Adult  Completed        Plan:    I have personally reviewed and addressed the Medicare Annual Wellness questionnaire and have noted the following in the patient's chart:  A. Medical and social history B. Use of alcohol, tobacco or illicit drugs  C. Current medications and supplements D. Functional ability and status E.  Nutritional status F.  Physical activity G. Advance directives H. List of other physicians I.  Hospitalizations, surgeries, and ER visits in previous 12 months J.  [place] to include hearing, vision, cognitive, depression L. Referrals and appointments - none  In addition, I have reviewed and discussed with patient certain preventive protocols, quality metrics, and best practice recommendations. A written personalized care plan for preventive services as well as general preventive health recommendations were provided to patient.  See attached scanned  questionnaire for additional information.   Signed,   Rich Reining, RN Nurse Health Advisor   Quick Notes   Health Maintenance: up to date     Abnormal Screen: unable to complete mental exam due to dementia     Patient Concerns: none     Nurse Concerns: none

## 2016-10-27 NOTE — Patient Instructions (Signed)
Mike Williams , Thank you for taking time to come for your Medicare Wellness Visit. I appreciate your ongoing commitment to your health goals. Please review the following plan we discussed and let me know if I can assist you in the future.   Screening recommendations/referrals: Colonoscopy up to date, long term pt Recommended yearly ophthalmology/optometry visit for glaucoma screening and checkup Recommended yearly dental visit for hygiene and checkup  Vaccinations: Influenza vaccine up to date. Due 02/09/17 Pneumococcal vaccine up to date Tdap vaccine due 09/23/2018 Shingles vaccine not in records  Advanced directives: Need a copy for chart  Conditions/risks identified: None  Next appointment: Dr. Eulas Post makes rounds  Preventive Care 47 Years and Older, Male Preventive care refers to lifestyle choices and visits with your health care provider that can promote health and wellness. What does preventive care include?  A yearly physical exam. This is also called an annual well check.  Dental exams once or twice a year.  Routine eye exams. Ask your health care provider how often you should have your eyes checked.  Personal lifestyle choices, including:  Daily care of your teeth and gums.  Regular physical activity.  Eating a healthy diet.  Avoiding tobacco and drug use.  Limiting alcohol use.  Practicing safe sex.  Taking low doses of aspirin every day.  Taking vitamin and mineral supplements as recommended by your health care provider. What happens during an annual well check? The services and screenings done by your health care provider during your annual well check will depend on your age, overall health, lifestyle risk factors, and family history of disease. Counseling  Your health care provider may ask you questions about your:  Alcohol use.  Tobacco use.  Drug use.  Emotional well-being.  Home and relationship well-being.  Sexual activity.  Eating  habits.  History of falls.  Memory and ability to understand (cognition).  Work and work Statistician. Screening  You may have the following tests or measurements:  Height, weight, and BMI.  Blood pressure.  Lipid and cholesterol levels. These may be checked every 5 years, or more frequently if you are over 20 years old.  Skin check.  Lung cancer screening. You may have this screening every year starting at age 47 if you have a 30-pack-year history of smoking and currently smoke or have quit within the past 15 years.  Fecal occult blood test (FOBT) of the stool. You may have this test every year starting at age 55.  Flexible sigmoidoscopy or colonoscopy. You may have a sigmoidoscopy every 5 years or a colonoscopy every 10 years starting at age 39.  Prostate cancer screening. Recommendations will vary depending on your family history and other risks.  Hepatitis C blood test.  Hepatitis B blood test.  Sexually transmitted disease (STD) testing.  Diabetes screening. This is done by checking your blood sugar (glucose) after you have not eaten for a while (fasting). You may have this done every 1-3 years.  Abdominal aortic aneurysm (AAA) screening. You may need this if you are a current or former smoker.  Osteoporosis. You may be screened starting at age 65 if you are at high risk. Talk with your health care provider about your test results, treatment options, and if necessary, the need for more tests. Vaccines  Your health care provider may recommend certain vaccines, such as:  Influenza vaccine. This is recommended every year.  Tetanus, diphtheria, and acellular pertussis (Tdap, Td) vaccine. You may need a Td booster every 10  years.  Zoster vaccine. You may need this after age 94.  Pneumococcal 13-valent conjugate (PCV13) vaccine. One dose is recommended after age 76.  Pneumococcal polysaccharide (PPSV23) vaccine. One dose is recommended after age 64. Talk to your health  care provider about which screenings and vaccines you need and how often you need them. This information is not intended to replace advice given to you by your health care provider. Make sure you discuss any questions you have with your health care provider. Document Released: 05/18/2015 Document Revised: 01/09/2016 Document Reviewed: 02/20/2015 Elsevier Interactive Patient Education  2017 [place] Prevention in the Home Falls can cause injuries. They can happen to people of all ages. There are many things you can do to make your home safe and to help prevent falls. What can I do on the outside of my home?  Regularly fix the edges of walkways and driveways and fix any cracks.  Remove anything that might make you trip as you walk through a door, such as a raised step or threshold.  Trim any bushes or trees on the path to your home.  Use bright outdoor lighting.  Clear any walking paths of anything that might make someone trip, such as rocks or tools.  Regularly check to see if handrails are loose or broken. Make sure that both sides of any steps have handrails.  Any raised decks and porches should have guardrails on the edges.  Have any leaves, snow, or ice cleared regularly.  Use sand or salt on walking paths during winter.  Clean up any spills in your garage right away. This includes oil or grease spills. What can I do in the bathroom?  Use night lights.  Install grab bars by the toilet and in the tub and shower. Do not use towel bars as grab bars.  Use non-skid mats or decals in the tub or shower.  If you need to sit down in the shower, use a plastic, non-slip stool.  Keep the floor dry. Clean up any water that spills on the floor as soon as it happens.  Remove soap buildup in the tub or shower regularly.  Attach bath mats securely with double-sided non-slip rug tape.  Do not have throw rugs and other things on the floor that can make you trip. What can I do  in the bedroom?  Use night lights.  Make sure that you have a light by your bed that is easy to reach.  Do not use any sheets or blankets that are too big for your bed. They should not hang down onto the floor.  Have a firm chair that has side arms. You can use this for support while you get dressed.  Do not have throw rugs and other things on the floor that can make you trip. What can I do in the kitchen?  Clean up any spills right away.  Avoid walking on wet floors.  Keep items that you use a lot in easy-to-reach places.  If you need to reach something above you, use a strong step stool that has a grab bar.  Keep electrical cords out of the way.  Do not use floor polish or wax that makes floors slippery. If you must use wax, use non-skid floor wax.  Do not have throw rugs and other things on the floor that can make you trip. What can I do with my stairs?  Do not leave any items on the stairs.  Make sure that  there are handrails on both sides of the stairs and use them. Fix handrails that are broken or loose. Make sure that handrails are as long as the stairways.  Check any carpeting to make sure that it is firmly attached to the stairs. Fix any carpet that is loose or worn.  Avoid having throw rugs at the top or bottom of the stairs. If you do have throw rugs, attach them to the floor with carpet tape.  Make sure that you have a light switch at the top of the stairs and the bottom of the stairs. If you do not have them, ask someone to add them for you. What else can I do to help prevent falls?  Wear shoes that:  Do not have high heels.  Have rubber bottoms.  Are comfortable and fit you well.  Are closed at the toe. Do not wear sandals.  If you use a stepladder:  Make sure that it is fully opened. Do not climb a closed stepladder.  Make sure that both sides of the stepladder are locked into place.  Ask someone to hold it for you, if possible.  Clearly mark  and make sure that you can see:  Any grab bars or handrails.  First and last steps.  Where the edge of each step is.  Use tools that help you move around (mobility aids) if they are needed. These include:  Canes.  Walkers.  Scooters.  Crutches.  Turn on the lights when you go into a dark area. Replace any light bulbs as soon as they burn out.  Set up your furniture so you have a clear path. Avoid moving your furniture around.  If any of your floors are uneven, fix them.  If there are any pets around you, be aware of where they are.  Review your medicines with your doctor. Some medicines can make you feel dizzy. This can increase your chance of falling. Ask your doctor what other things that you can do to help prevent falls. This information is not intended to replace advice given to you by your health care provider. Make sure you discuss any questions you have with your health care provider. Document Released: 02/15/2009 Document Revised: 09/27/2015 Document Reviewed: 05/26/2014 Elsevier Interactive Patient Education  2017 [employer].

## 2016-11-13 ENCOUNTER — Non-Acute Institutional Stay (SKILLED_NURSING_FACILITY): Payer: Medicare Other | Admitting: Adult Health

## 2016-11-13 ENCOUNTER — Encounter: Payer: Self-pay | Admitting: Adult Health

## 2016-11-13 DIAGNOSIS — I10 Essential (primary) hypertension: Secondary | ICD-10-CM | POA: Diagnosis not present

## 2016-11-13 DIAGNOSIS — R569 Unspecified convulsions: Secondary | ICD-10-CM | POA: Diagnosis not present

## 2016-11-13 DIAGNOSIS — I639 Cerebral infarction, unspecified: Secondary | ICD-10-CM | POA: Diagnosis not present

## 2016-11-13 DIAGNOSIS — F0280 Dementia in other diseases classified elsewhere without behavioral disturbance: Secondary | ICD-10-CM

## 2016-11-13 DIAGNOSIS — G301 Alzheimer's disease with late onset: Secondary | ICD-10-CM

## 2016-11-13 NOTE — Progress Notes (Signed)
Location:   [facility] Room Number: 107 A Place of Service:  SNF (31)   CODE STATUS: full code   No Known Allergies  Chief Complaint  Patient presents with  . Medical Management of Chronic Issues    Routine Visit    HPI:  He is a 83 year old resident of this facility being seen for the management of his chronic illnesses: hypertension; seziure; cva; Alzheimer's disease. Overall there is little change in his status. He is unable to fully participate in the hpi or ros . He does get out of bed daily. There are no nursing concerns at this time.    Past Medical History:  Diagnosis Date  . Aortic insufficiency 07/04/2011   2/13 Study Conclusions  - Left ventricle: Systolic function was normal. The estimated ejection fraction was in the range of 55% to 60%. - Aortic valve: Moderate regurgitation. - Aorta: The aorta was moderately dilated. Transthoracic echocardiography. M-mode, complete 2D, spectral Doppler, and color Doppler. Height: Height: 170.2cm. Height: 67in. Weight: Weight: 84.1kg. Weight: 185lb. Body mass index: BMI: 29kg/m^2. Body surface area: BSA: 2.57m^2. Blood pressure: 175/86. Patient status: Inpatient. Location: Bedside.      . Aortic root dilatation (HCC)    Noted on 09/2010 echo, with mild-mod AI  . CEREBROVASCULAR ACCIDENT, HX OF 02/09/2007  . DEMENTIA 05/12/2007  . Hemorrhoids   . HYPERGLYCEMIA 09/21/2009  . HYPERTENSION 12/29/2006  . Lactose intolerance   . OBSTRUCTIVE SLEEP APNEA 12/29/2006  . PROSTATE CANCER, HX OF 12/29/2006  . Prostatitis   . Risk for falls   . Seizures ([place])     Past Surgical History:  Procedure Laterality Date  . CHOLECYSTECTOMY    . GANGLION CYST EXCISION    . PROSTATE SURGERY     cryotherapy    Social History   Social History  . Marital status: Married    Spouse name: N/A  . Number of children: 2  . Years of education: master's   Occupational History  . retired    Social History Main Topics  . Smoking status:  Never Smoker  . Smokeless tobacco: Never Used  . Alcohol use No  . Drug use: No  . Sexual activity: Not on file   Other Topics Concern  . Not on file   Social History Narrative   Married 52 years in 2016 to wife [name] (goes to [place] urgent care). 2 grown sons. 2 grandkids-boy and girl.       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       Patient does not drink caffeine.   Patient is right handed.   Family History  Problem Relation Age of Onset  . Heart attack Father   . Heart disease Father   . Cancer Sister       VITAL SIGNS  BP 136/72   Pulse 92   Temp 97.8 F (36.6 C) (Oral)   Resp 16   Ht 5\' 7"  (1.702 m)   Wt 138 lb 3.2 oz (62.7 kg)   SpO2 97%   BMI 21.65 kg/m   Patient's Medications  New Prescriptions   No medications on file  Previous Medications   ACETAMINOPHEN (TYLENOL) 500 MG TABLET    Take 2 tablets (1,000 mg total) by mouth every 6 (six) hours as needed.   BETAMETHASONE DIPROPIONATE (DIPROLENE) 0.05 % CREAM    Apply 1 application topically daily.   CLOPIDOGREL (PLAVIX) 75 MG TABLET    take 1 tablet by mouth once  daily   DEXTROMETHORPHAN-QUINIDINE (NUEDEXTA) 20-10 MG CAPS    Take 1 capsule by mouth 2 (two) times daily.   EMOLLIENT (EUCERIN) LOTION    Apply to Dry skin topically two times daily   ESCITALOPRAM (LEXAPRO) 5 MG TABLET    Take 5 mg by mouth daily. For depression   FELODIPINE (PLENDIL) 10 MG 24 HR TABLET    Take 10 mg by mouth daily.   HYDRALAZINE HCL PO    Give 75 mg by mouth three times a day for Prophylaxis   LEVETIRACETAM (KEPPRA) 750 MG TABLET    Take 750 mg by mouth 2 (two) times daily.   NUTRITIONAL SUPPLEMENT LIQD    House supplement - Med Pass - Give 120 cc by mouth three times daily Nutritional treat - Give 4 ounces by mouth three times daily   OLMESARTAN (BENICAR) 40 MG TABLET    Take 40 mg by mouth daily.   SKIN PROTECTANTS, MISC. (CALAZIME SKIN PROTECTANT) PSTE    Apply 1 application topically 3 (three) times daily.    Modified Medications   No medications on file  Discontinued Medications   CEFTRIAXONE IN LIDOCAINE (WITH PRESERVATIVE) INJECTION    Inject 1,000 mg into the muscle daily. FOR 6 DAYS FOR UTI   CIPROFLOXACIN (CIPRO) 500 MG TABLET    Take 500 mg by mouth 2 (two) times daily.   LACTOBACILLUS (ACIDOPHILUS) CAPS CAPSULE    Take 1 capsule by mouth 2 (two) times daily.   LEVETIRACETAM (KEPPRA) 250 MG TABLET    Take 1 tablet (250 mg total) by mouth 2 (two) times daily.     SIGNIFICANT DIAGNOSTIC EXAMS  NO NEW EXAMS    LABS REVIEWED:   12-29-15: wbc 4.7; hgb 10.9; hct 31.9; mcv 82.5; plt 182; glucose 82; bun 18.8; creat 0.97; k+ 3.8; na++ 147  03-19-16: wbc 6.4; hgb 12.0; hct 37.2; mcv 89.7 ;plt 210; glucose 78; bun 19.5; creat 0.93; k+ 4.1; na++ 148  07-23-16: wbc 4.9; hgb 12.4; hct 36.8; mcv 89.;6 plt 181; glucose 81; bun 15.6; creat 0.77; k+ 4.0; na++ 143; ca 8.9; liver normal albumin 3.7   NO NEW LABS   Review of Systems  Unable to perform ROS: Dementia (confusion )    Physical Exam  Constitutional: No distress.  Thin   Eyes: Conjunctivae are normal.  Neck: Neck supple. No JVD present. No thyromegaly present.  Cardiovascular: Normal rate, regular rhythm and intact distal pulses.   Respiratory: Effort normal and breath sounds normal. No respiratory distress. He has no wheezes.  GI: Soft. Bowel sounds are normal. He exhibits no distension. There is no tenderness.  Musculoskeletal: He exhibits no edema.  Able to move all extremities   Lymphadenopathy:    He has no cervical adenopathy.  Neurological: He is alert.  Skin: Skin is warm and dry. He is not diaphoretic.  Psychiatric: He has a normal mood and affect.     ASSESSMENT/ PLAN:  TODAY  1. Hypertension: has aortic insufficiency: is stable  b/p 136/72   will continue benicar 20 mg daily;  plendil  10 mg daily and  apresoline to 75 mg three times daily   2. Seizure: is stable no further seizure activity will continue  keppra  to 500 mg twice daily    3. CVA: is neurologically stable will continue plavix 75 mg daily   4. Alzheimer's disease: is without significant change in status; is presently not on medications; will not make changes at this time;his current weight is  138 pounds; his weight in Dec 2017 was 142 pounds. He has completed his remeron therapy.    PREVIOUS   5. PBA: will continue nuedexta 20-10 mg twice daily and will monitor   6. Depression: will continue lexapro 5 mg daily   7. history of prostate cancer: is status post cryotherapy (06/2014): with [facility]    MD is aware of resident's narcotic use and is in agreement with current plan of care. We will attempt to wean resident as apropriate   Ok Edwards NP [facility]  Contact [phone] Monday through Friday 8am- 5pm  After hours call [phone]

## 2016-11-28 DIAGNOSIS — I639 Cerebral infarction, unspecified: Secondary | ICD-10-CM | POA: Insufficient documentation

## 2016-12-05 ENCOUNTER — Encounter: Payer: Self-pay | Admitting: Adult Health

## 2016-12-05 NOTE — Progress Notes (Signed)
Entered in error

## 2016-12-12 ENCOUNTER — Encounter: Payer: Self-pay | Admitting: Adult Health

## 2016-12-12 ENCOUNTER — Non-Acute Institutional Stay (SKILLED_NURSING_FACILITY): Payer: Medicare Other | Admitting: Adult Health

## 2016-12-12 DIAGNOSIS — F482 Pseudobulbar affect: Secondary | ICD-10-CM

## 2016-12-12 DIAGNOSIS — F329 Major depressive disorder, single episode, unspecified: Secondary | ICD-10-CM | POA: Diagnosis not present

## 2016-12-12 DIAGNOSIS — F32A Depression, unspecified: Secondary | ICD-10-CM

## 2016-12-12 DIAGNOSIS — Z8546 Personal history of malignant neoplasm of prostate: Secondary | ICD-10-CM

## 2016-12-12 NOTE — Progress Notes (Signed)
Location:   [facility] Room Number: 107 A Place of Service:  SNF (31)   CODE STATUS: Full code  No Known Allergies  Chief Complaint  Patient presents with  . Medical Management of Chronic Issues    1 month follow up    HPI:  He is an 83 year old long term resident of this facility being for the management of his chronic illnesses: PBA depression,and prostate cancer .  He does engage with those around; however he cannot participate in the hpi or ros due to his dementia. There are reports of falls; appetite changes; of behavioral issues. He does get out of bed daily. There are no nursing concerns at this time.    Past Medical History:  Diagnosis Date  . Aortic insufficiency 07/04/2011   2/13 Study Conclusions  - Left ventricle: Systolic function was normal. The estimated ejection fraction was in the range of 55% to 60%. - Aortic valve: Moderate regurgitation. - Aorta: The aorta was moderately dilated. Transthoracic echocardiography. M-mode, complete 2D, spectral Doppler, and color Doppler. Height: Height: 170.2cm. Height: 67in. Weight: Weight: 84.1kg. Weight: 185lb. Body mass index: BMI: 29kg/m^2. Body surface area: BSA: 2.8m^2. Blood pressure: 175/86. Patient status: Inpatient. Location: Bedside.      . Aortic root dilatation (HCC)    Noted on 09/2010 echo, with mild-mod AI  . CEREBROVASCULAR ACCIDENT, HX OF 02/09/2007  . DEMENTIA 05/12/2007  . Hemorrhoids   . HYPERGLYCEMIA 09/21/2009  . HYPERTENSION 12/29/2006  . Lactose intolerance   . OBSTRUCTIVE SLEEP APNEA 12/29/2006  . PROSTATE CANCER, HX OF 12/29/2006  . Prostatitis   . Risk for falls   . Seizures ([place])     Past Surgical History:  Procedure Laterality Date  . CHOLECYSTECTOMY    . GANGLION CYST EXCISION    . PROSTATE SURGERY     cryotherapy    Social History   Social History  . Marital status: Married    Spouse name: N/A  . Number of children: 2  . Years of education: master's   Occupational History    . retired    Social History Main Topics  . Smoking status: Never Smoker  . Smokeless tobacco: Never Used  . Alcohol use No  . Drug use: No  . Sexual activity: Not on file   Other Topics Concern  . Not on file   Social History Narrative   Married 52 years in 2016 to wife [name] (goes to [place] urgent care). 2 grown sons. 2 grandkids-boy and girl.       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       Patient does not drink caffeine.   Patient is right handed.   Family History  Problem Relation Age of Onset  . Heart attack Father   . Heart disease Father   . Cancer Sister       VITAL SIGNS BP 122/78   Pulse 68   Temp 97.8 F (36.6 C)   Resp 18   Ht 5\' 7"  (1.702 m)   Wt 133 lb 14.4 oz (60.7 kg)   SpO2 98%   BMI 20.97 kg/m   Patient's Medications  New Prescriptions   No medications on file  Previous Medications   ACETAMINOPHEN (TYLENOL) 500 MG TABLET    Take 2 tablets (1,000 mg total) by mouth every 6 (six) hours as needed.   BETAMETHASONE DIPROPIONATE (DIPROLENE) 0.05 % CREAM    Apply 1 application topically daily. Apply to Groin area   CLOPIDOGREL (  PLAVIX) 75 MG TABLET    take 1 tablet by mouth once daily   DEXTROMETHORPHAN-QUINIDINE (NUEDEXTA) 20-10 MG CAPS    Take 1 capsule by mouth 2 (two) times daily.   EMOLLIENT (EUCERIN) LOTION    Apply to Dry skin topically two times daily   ESCITALOPRAM (LEXAPRO) 5 MG TABLET    Take 5 mg by mouth daily. For depression   FELODIPINE (PLENDIL) 10 MG 24 HR TABLET    Take 10 mg by mouth daily.   HYDRALAZINE HCL PO    Give 75 mg by mouth three times a day for Prophylaxis   LEVETIRACETAM (KEPPRA) 750 MG TABLET    Take 750 mg by mouth 2 (two) times daily.   NUTRITIONAL SUPPLEMENT LIQD    House supplement - Med Pass - Give 120 cc by mouth three times daily Nutritional treat - Give 4 ounces by mouth three times daily   OLMESARTAN (BENICAR) 40 MG TABLET    Take 40 mg by mouth daily.  Modified Medications   No medications on  file  Discontinued Medications   SKIN PROTECTANTS, MISC. (CALAZIME SKIN PROTECTANT) PSTE    Apply 1 application topically 3 (three) times daily.      SIGNIFICANT DIAGNOSTIC EXAMS  NO NEW EXAMS    LABS REVIEWED:   12-29-15: wbc 4.7; hgb 10.9; hct 31.9; mcv 82.5; plt 182; glucose 82; bun 18.8; creat 0.97; k+ 3.8; na++ 147  03-19-16: wbc 6.4; hgb 12.0; hct 37.2; mcv 89.7 ;plt 210; glucose 78; bun 19.5; creat 0.93; k+ 4.1; na++ 148  07-23-16: wbc 4.9; hgb 12.4; hct 36.8; mcv 89.;6 plt 181; glucose 81; bun 15.6; creat 0.77; k+ 4.0; na++ 143; ca 8.9; liver normal albumin 3.7   NO NEW LABS   Review of Systems  Unable to perform ROS: Dementia (confused unable to answer questions )    Physical Exam  Constitutional: No distress.  Eyes: Conjunctivae are normal.  Neck: Neck supple. No JVD present. No thyromegaly present.  Cardiovascular: Normal rate, regular rhythm and intact distal pulses.   Respiratory: Effort normal and breath sounds normal. No respiratory distress. He has no wheezes.  GI: Soft. Bowel sounds are normal. He exhibits no distension. There is no tenderness.  Musculoskeletal: He exhibits no edema.  Able to move all extremities   Lymphadenopathy:    He has no cervical adenopathy.  Neurological: He is alert.  Skin: Skin is warm and dry. He is not diaphoretic.  Psychiatric: He has a normal mood and affect.   ASSESSMENT/ PLAN:  TODAY  1. PBA: will continue nuedexta 20-10 mg twice daily and will monitor   2. Depression: will continue lexapro 5 mg daily   3. history of prostate cancer: is status post cryotherapy (06/2014): with [facility]   PREVIOUS   1. Hypertension: has aortic insufficiency: is stable  b/p 136/72   will continue benicar 20 mg daily;  plendil  10 mg daily and  apresoline to 75 mg three times daily   2. Seizure: is stable no further seizure activity will continue  keppra to 500 mg twice daily    3. CVA: is neurologically stable will continue  plavix 75 mg daily   4. Alzheimer's disease: is without significant change in status; is presently not on medications; will not make changes at this time;his current weight is 133 pounds; his weight in Dec 2017 was 142 pounds. He has completed his remeron therapy.     Ok Edwards NP [facility]  Contact  [phone] Monday through Friday 8am- 5pm  After hours call [phone]

## 2017-01-12 ENCOUNTER — Encounter: Payer: Self-pay | Admitting: Internal Medicine

## 2017-01-12 ENCOUNTER — Non-Acute Institutional Stay (SKILLED_NURSING_FACILITY): Payer: Medicare Other | Admitting: Internal Medicine

## 2017-01-12 DIAGNOSIS — F0280 Dementia in other diseases classified elsewhere without behavioral disturbance: Secondary | ICD-10-CM

## 2017-01-12 DIAGNOSIS — E870 Hyperosmolality and hypernatremia: Secondary | ICD-10-CM

## 2017-01-12 DIAGNOSIS — F482 Pseudobulbar affect: Secondary | ICD-10-CM | POA: Diagnosis not present

## 2017-01-12 DIAGNOSIS — Z8673 Personal history of transient ischemic attack (TIA), and cerebral infarction without residual deficits: Secondary | ICD-10-CM | POA: Diagnosis not present

## 2017-01-12 DIAGNOSIS — R569 Unspecified convulsions: Secondary | ICD-10-CM | POA: Diagnosis not present

## 2017-01-12 DIAGNOSIS — I10 Essential (primary) hypertension: Secondary | ICD-10-CM

## 2017-01-12 DIAGNOSIS — G301 Alzheimer's disease with late onset: Secondary | ICD-10-CM

## 2017-01-12 NOTE — Progress Notes (Signed)
Patient ID: Mike Williams, male   DOB: 18-Aug-1932, 83 y.o.   MRN: [id number]     DATE:  January 12, 2017  Location:   [facility] Room Number: 107 A Place of Service: SNF (31)   Extended Emergency Contact Information Primary Emergency Contact: Mike Williams Address: [address]          [place], [place] [address] [place] Phone: [phone] Relation: Spouse  Advanced Directive information Does Patient Have a Medical Advance Directive?: Yes, Type of Advance Directive: Out of facility DNR (pink MOST or yellow form), Pre-existing out of facility DNR order (yellow form or pink MOST form): Pink MOST form placed in chart (order not valid for inpatient use), Does patient want to make changes to medical advance directive?: No - Patient declined [place] Chief Complaint  Patient presents with  . Medical Management of Chronic Issues    1 month follow up    HPI:  83 yo male long term resident seen today for f/u. He has no concerns today. No nursing issues. He is tolerating tx. No falls. Appetite ok and sleeps well. He is a  Poor historian due to dementia. Hx obtained from chart.  PBA - stable on nuedexta 20-10 mg twice daily  Depression - mood stable on lexapro 5 mg daily   Hx of prostate cancer - s/p cryotherapy (06/2014). Followed by [facility]    Hypertension/hx Aortic insufficiency - BP stable on benicar 40 mg daily;  plendil 10 mg daily; apresoline to 75 mg three times daily. Takes plavix daily   Seizure d/o - stable on keppra to 750 mg twice daily    Hx CVA - stable on plavix 75 mg daily. LDL 50  Alzheimer's disease - unchanged. He gets nutritional supplements per facility protocol. He completed his remeron therapy. Weight stable. He does not take any meds for cognition  Joint pain - stable on prn tylenol ES  Past Medical History:  Diagnosis Date  . Aortic insufficiency 07/04/2011   2/13 Study Conclusions  - Left ventricle: Systolic  function was normal. The estimated ejection fraction was in the range of 55% to 60%. - Aortic valve: Moderate regurgitation. - Aorta: The aorta was moderately dilated. Transthoracic echocardiography. M-mode, complete 2D, spectral Doppler, and color Doppler. Height: Height: 170.2cm. Height: 67in. Weight: Weight: 84.1kg. Weight: 185lb. Body mass index: BMI: 29kg/m^2. Body surface area: BSA: 2.103m^2. Blood pressure: 175/86. Patient status: Inpatient. Location: Bedside.      . Aortic root dilatation (HCC)    Noted on 09/2010 echo, with mild-mod AI  . CEREBROVASCULAR ACCIDENT, HX OF 02/09/2007  . DEMENTIA 05/12/2007  . Hemorrhoids   . HYPERGLYCEMIA 09/21/2009  . HYPERTENSION 12/29/2006  . Lactose intolerance   . OBSTRUCTIVE SLEEP APNEA 12/29/2006  . PROSTATE CANCER, HX OF 12/29/2006  . Prostatitis   . Risk for falls   . Seizures ([place])     Past Surgical History:  Procedure Laterality Date  . CHOLECYSTECTOMY    . GANGLION CYST EXCISION    . PROSTATE SURGERY     cryotherapy    Patient Care Team: Mike Cranker, DO as PCP - General (Internal Medicine)  Social History   Social History  . Marital status: Married    Spouse name: N/A  . Number of children: 2  . Years of education: master's   Occupational History  . retired    Social History Main Topics  . Smoking status: Never Smoker  . Smokeless tobacco: Never Used  .  Alcohol use No  . Drug use: No  . Sexual activity: Not on file   Other Topics Concern  . Not on file   Social History Narrative   Married 52 years in 2016 to wife Mike Williams (goes to [place] urgent care). 2 grown sons. 2 grandkids-boy and girl.       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       Patient does not drink caffeine.   Patient is right handed.     reports that he has never smoked. He has never used smokeless tobacco. He reports that he does not drink alcohol or use drugs.  Family History  Problem Relation Age of Onset  . Heart attack Father   .  Heart disease Father   . Cancer Sister    Family Status  Relation Status  . Father Deceased  . Sister Deceased at age 38  . Mother Deceased at age 39       never had contact with  . Brother Pension scheme manager History  Administered Date(s) Administered  . Influenza Split 01/22/2011, 01/15/2012  . Influenza Whole 04/04/2006, 05/12/2007, 01/04/2010  . Influenza,inj,Quad PF,6+ Mos 02/01/2013, 02/14/2014, 01/11/2015  . Influenza-Unspecified 02/10/2016  . PPD Test 05/22/2015, 01/11/2016, 01/18/2016  . Pneumococcal Conjugate-13 01/11/2015  . Pneumococcal Polysaccharide-23 05/12/2007  . Td 09/23/1998    No Known Allergies  Medications: Patient's Medications  New Prescriptions   No medications on file  Previous Medications   ACETAMINOPHEN (TYLENOL) 500 MG TABLET    Take 2 tablets (1,000 mg total) by mouth every 6 (six) hours as needed.   ACETAMINOPHEN (TYLENOL) 650 MG SUPPOSITORY    Place 650 mg rectally every 4 (four) hours as needed for fever. Do not exceed 3000 mg in 24 hours   BETAMETHASONE DIPROPIONATE (DIPROLENE) 0.05 % CREAM    Apply 1 application topically daily. Apply to Groin area   CLOPIDOGREL (PLAVIX) 75 MG TABLET    take 1 tablet by mouth once daily   DEXTROMETHORPHAN-QUINIDINE (NUEDEXTA) 20-10 MG CAPS    Take 1 capsule by mouth 2 (two) times daily.   EMOLLIENT (EUCERIN) LOTION    Apply to Dry skin topically two times daily   ESCITALOPRAM (LEXAPRO) 5 MG TABLET    Take 5 mg by mouth daily. For depression   FELODIPINE (PLENDIL) 10 MG 24 HR TABLET    Take 10 mg by mouth daily.   HYDRALAZINE HCL PO    Give 75 mg by mouth three times a day for Prophylaxis   LEVETIRACETAM (KEPPRA) 750 MG TABLET    Take 750 mg by mouth 2 (two) times daily.   NUTRITIONAL SUPPLEMENT LIQD    House supplement - Med Pass - Give 120 cc by mouth three times daily Nutritional treat - Give 4 ounces by mouth three times daily   OLMESARTAN (BENICAR) 40 MG TABLET    Take 40 mg by mouth daily.    Modified Medications   No medications on file  Discontinued Medications   No medications on file    Review of Systems  Unable to perform ROS: Dementia    Vitals:   01/12/17 0913  BP: (!) 150/74  Pulse: 74  Resp: 18  Temp: 97.8 F (36.6 C)  SpO2: 99%  Weight: 133 lb 14.1 oz (60.7 kg)  Height: 5\' 7"  (1.702 m)   Body mass index is 20.97 kg/m.  Physical Exam  Constitutional: He is oriented to person, place, and time. He appears well-developed.  Frail appearing  sitting in w/c in NAD  HENT:  Mouth/Throat: Oropharynx is clear and moist.  MMM; no oral thrush  Eyes: Pupils are equal, round, and reactive to light. No scleral icterus.  Neck: Neck supple. Carotid bruit is not present. No thyromegaly present.  Cardiovascular: Normal rate, regular rhythm and intact distal pulses.  Exam reveals no gallop and no friction rub.   Murmur (1/6 SEM) heard. No distal LE edema. No calf TTP  Pulmonary/Chest: Effort normal and breath sounds normal. He has no wheezes. He has no rales. He exhibits no tenderness.  Abdominal: Soft. Normal appearance and bowel sounds are normal. He exhibits no distension, no abdominal bruit, no pulsatile midline mass and no mass. There is no hepatomegaly. There is no tenderness. There is no rigidity, no rebound and no guarding. No hernia.  Musculoskeletal: He exhibits edema.  Lymphadenopathy:    He has no cervical adenopathy.  Neurological: He is alert and oriented to person, place, and time. He has normal reflexes.  Skin: Skin is warm and dry. No rash noted.  Psychiatric: He has a normal mood and affect. His behavior is normal.     Labs reviewed: Nursing Home on 10/23/2016  Component Date Value Ref Range Status  . Hemoglobin 10/23/2016 12.8* 13.5 - 17.5 Final  . HCT 10/23/2016 37* 41 - 53 Final  . Platelets 10/23/2016 125* 150 - 399 Final  . WBC 10/23/2016 9.8   Final  . Glucose 10/23/2016 131   Final  . BUN 10/23/2016 20  4 - 21 Final  . Creatinine  10/23/2016 0.9  0.6 - 1.3 Final  . Potassium 10/23/2016 3.6  3.4 - 5.3 Final  . Sodium 10/23/2016 150* 137 - 147 Final  Abstract on 10/20/2016  Component Date Value Ref Range Status  . Hemoglobin 10/18/2016 11.9* 13.5 - 17.5 Final  . HCT 10/18/2016 37* 41 - 53 Final  . Neutrophils Absolute 10/18/2016 4   Final  . Platelets 10/18/2016 159  150 - 399 Final  . WBC 10/18/2016 5.8   Final  . Glucose 10/18/2016 91   Final  . BUN 10/18/2016 25* 4 - 21 Final  . Creatinine 10/18/2016 0.9  0.6 - 1.3 Final  . Potassium 10/18/2016 3.9  3.4 - 5.3 Final  . Sodium 10/18/2016 153* 137 - 147 Final  . Alkaline Phosphatase 10/18/2016 114  25 - 125 Final  . ALT 10/18/2016 11  10 - 40 Final  . AST 10/18/2016 12* 14 - 40 Final  . Bilirubin, Total 10/18/2016 0.3   Final    No results found.   Assessment/Plan   ICD-10-CM   1. Essential hypertension I10    suboptimally controlled  2. Seizures ([place]) R56.9   3. Late onset Alzheimer's disease without behavioral disturbance G30.1    F02.80   4. PBA (pseudobulbar affect) F48.2   5. History of CVA (cerebrovascular accident) Z86.73   6. Hypernatremia E87.0     Check lipid panel, CMP  Increase hydralazine 100mg  po TID for HTN  Cont nutritional supplements as ordered  PT/OT as ordered  F/u specialists as indicated  Will follow   Thermon Zulauf S. Perlie Gold  [facility] [address] [place], [place] [address] [phone] Cell (Monday-Friday 8 AM - 5 PM) [phone] After 5 PM and follow prompts

## 2017-02-11 DIAGNOSIS — R531 Weakness: Secondary | ICD-10-CM | POA: Diagnosis not present

## 2017-02-12 ENCOUNTER — Encounter: Payer: Self-pay | Admitting: Adult Health

## 2017-02-12 ENCOUNTER — Non-Acute Institutional Stay (SKILLED_NURSING_FACILITY): Payer: Medicare Other | Admitting: Adult Health

## 2017-02-12 DIAGNOSIS — F0280 Dementia in other diseases classified elsewhere without behavioral disturbance: Secondary | ICD-10-CM

## 2017-02-12 DIAGNOSIS — R569 Unspecified convulsions: Secondary | ICD-10-CM | POA: Diagnosis not present

## 2017-02-12 DIAGNOSIS — G301 Alzheimer's disease with late onset: Secondary | ICD-10-CM | POA: Diagnosis not present

## 2017-02-12 DIAGNOSIS — I639 Cerebral infarction, unspecified: Secondary | ICD-10-CM | POA: Diagnosis not present

## 2017-02-12 DIAGNOSIS — I10 Essential (primary) hypertension: Secondary | ICD-10-CM

## 2017-02-12 NOTE — Progress Notes (Signed)
Location:   [place] Room Number: 107 A Place of Service:  SNF (31)   CODE STATUS: Full Code  No Known Allergies  Chief Complaint  Patient presents with  . Medical Management of Chronic Issues    Hypertension; seizure; cva; Alzheimer disease     HPI:  He is a 83 year old long term resident of this facility being seen for the management of his chronic illnesses: essential hypertension; Alzheimer's dementia; seizures; cva. He is unable to participate in the hpi or ros. There are no reports of elevated b/p; no seizure activity; no issues with pain; no changes in appetite. There are no nursing concerns at this time.    Past Medical History:  Diagnosis Date  . Aortic insufficiency 07/04/2011   2/13 Study Conclusions  - Left ventricle: Systolic function was normal. The estimated ejection fraction was in the range of 55% to 60%. - Aortic valve: Moderate regurgitation. - Aorta: The aorta was moderately dilated. Transthoracic echocardiography. M-mode, complete 2D, spectral Doppler, and color Doppler. Height: Height: 170.2cm. Height: 67in. Weight: Weight: 84.1kg. Weight: 185lb. Body mass index: BMI: 29kg/m^2. Body surface area: BSA: 2.51m^2. Blood pressure: 175/86. Patient status: Inpatient. Location: Bedside.      . Aortic root dilatation (HCC)    Noted on 09/2010 echo, with mild-mod AI  . CEREBROVASCULAR ACCIDENT, HX OF 02/09/2007  . DEMENTIA 05/12/2007  . Hemorrhoids   . HYPERGLYCEMIA 09/21/2009  . HYPERTENSION 12/29/2006  . Lactose intolerance   . OBSTRUCTIVE SLEEP APNEA 12/29/2006  . PROSTATE CANCER, HX OF 12/29/2006  . Prostatitis   . Risk for falls   . Seizures ([place])     Past Surgical History:  Procedure Laterality Date  . CHOLECYSTECTOMY    . GANGLION CYST EXCISION    . PROSTATE SURGERY     cryotherapy    Social History   Social History  . Marital status: Married    Spouse name: N/A  . Number of children: 2  . Years of education: master's   Occupational  History  . retired    Social History Main Topics  . Smoking status: Never Smoker  . Smokeless tobacco: Never Used  . Alcohol use No  . Drug use: No  . Sexual activity: Not on file   Other Topics Concern  . Not on file   Social History Narrative   Married 52 years in 2016 to wife [name] (goes to [place] urgent care). 2 grown sons. 2 grandkids-boy and girl.       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       Patient does not drink caffeine.   Patient is right handed.   Family History  Problem Relation Age of Onset  . Heart attack Father   . Heart disease Father   . Cancer Sister       VITAL SIGNS BP (!) 148/78   Pulse 70   Temp (!) 97.2 F (36.2 C)   Resp 18   Ht 5\' 7"  (1.702 m)   Wt 135 lb 11.2 oz (61.6 kg)   SpO2 97%   BMI 21.25 kg/m   Patient's Medications  New Prescriptions   No medications on file  Previous Medications   ACETAMINOPHEN (TYLENOL) 500 MG TABLET    Take 2 tablets (1,000 mg total) by mouth every 6 (six) hours as needed.   ACETAMINOPHEN (TYLENOL) 650 MG SUPPOSITORY    Place 650 mg rectally every 4 (four) hours as needed for fever. Do not exceed 3000 mg  in 24 hours   BETAMETHASONE DIPROPIONATE (DIPROLENE) 0.05 % CREAM    Apply 1 application topically daily. Apply to Groin area   CLOPIDOGREL (PLAVIX) 75 MG TABLET    take 1 tablet by mouth once daily   DEXTROMETHORPHAN-QUINIDINE (NUEDEXTA) 20-10 MG CAPS    Take 1 capsule by mouth 2 (two) times daily.   EMOLLIENT (EUCERIN) LOTION    Apply to Dry skin topically two times daily   ESCITALOPRAM (LEXAPRO) 5 MG TABLET    Take 5 mg by mouth daily. For depression   FELODIPINE (PLENDIL) 10 MG 24 HR TABLET    Take 10 mg by mouth daily.   HYDRALAZINE HCL PO    Give 100 mg by mouth three times a day for Prophylaxis   LEVETIRACETAM (KEPPRA) 750 MG TABLET    Take 750 mg by mouth 2 (two) times daily.   NUTRITIONAL SUPPLEMENT LIQD    House supplement - Med Pass - Give 120 cc by mouth three times  daily Nutritional treat - Give 4 ounces by mouth three times daily   OLMESARTAN (BENICAR) 40 MG TABLET    Take 40 mg by mouth daily.  Modified Medications   No medications on file  Discontinued Medications   No medications on file     SIGNIFICANT DIAGNOSTIC EXAMS  NO NEW EXAMS    LABS REVIEWED:    03-19-16: wbc 6.4; hgb 12.0; hct 37.2; mcv 89.7 ;plt 210; glucose 78; bun 19.5; creat 0.93; k+ 4.1; na++ 148  07-23-16: wbc 4.9; hgb 12.4; hct 36.8; mcv 89.;6 plt 181; glucose 81; bun 15.6; creat 0.77; k+ 4.0; na++ 143; ca 8.9; liver normal albumin 3.7   NO NEW LABS   Review of Systems  Unable to perform ROS: Dementia (confused; unable to answer questions )   Physical Exam  Constitutional: No distress.  Frail   Neck: Neck supple. No thyromegaly present.  Cardiovascular: Normal rate, regular rhythm and intact distal pulses.   Murmur heard. 1/6  Pulmonary/Chest: Effort normal and breath sounds normal. No respiratory distress.  Abdominal: Soft. Bowel sounds are normal. He exhibits no distension. There is no tenderness.  Musculoskeletal: He exhibits no edema.  Able to move all extremities   Lymphadenopathy:    He has no cervical adenopathy.  Neurological: He is alert.  Skin: Skin is warm and dry. He is not diaphoretic.  Psychiatric: He has a normal mood and affect.    ASSESSMENT/ PLAN:  TODAY  1. Hypertension: has aortic insufficiency: is stable  b/p 148/78   will continue benicar 20 mg daily;  plendil  10 mg daily and  apresoline to 75 mg three times daily   2. Seizure: is stable no further seizure activity will continue  keppra to 500 mg twice daily    3. CVA: is neurologically stable will continue plavix 75 mg daily   4. Alzheimer's disease: is without significant change in status; is presently not on medications; will not make changes at this time;his current weight is 135 pounds; his weight in Dec 2017 was 142 pounds. He has completed his remeron therapy.   PREVIOUS    5. PBA: will continue nuedexta 20-10 mg twice daily and will monitor   6. Depression: will continue lexapro 5 mg daily   7. history of prostate cancer: is status post cryotherapy (06/2014): with [facility]   MD is aware of resident's narcotic use and is in agreement with current plan of care. We will attempt to wean resident as apropriate  Ok Edwards NP [facility]  Contact [phone] Monday through Friday 8am- 5pm  After hours call [phone]

## 2017-02-24 DIAGNOSIS — R630 Anorexia: Secondary | ICD-10-CM | POA: Diagnosis not present

## 2017-02-24 DIAGNOSIS — F482 Pseudobulbar affect: Secondary | ICD-10-CM | POA: Diagnosis not present

## 2017-02-24 DIAGNOSIS — F329 Major depressive disorder, single episode, unspecified: Secondary | ICD-10-CM | POA: Diagnosis not present

## 2017-02-24 DIAGNOSIS — G309 Alzheimer's disease, unspecified: Secondary | ICD-10-CM | POA: Diagnosis not present

## 2017-03-12 ENCOUNTER — Encounter: Payer: Self-pay | Admitting: Adult Health

## 2017-03-12 ENCOUNTER — Non-Acute Institutional Stay (SKILLED_NURSING_FACILITY): Payer: Medicare Other | Admitting: Adult Health

## 2017-03-12 DIAGNOSIS — Z8546 Personal history of malignant neoplasm of prostate: Secondary | ICD-10-CM

## 2017-03-12 DIAGNOSIS — F329 Major depressive disorder, single episode, unspecified: Secondary | ICD-10-CM | POA: Diagnosis not present

## 2017-03-12 DIAGNOSIS — F482 Pseudobulbar affect: Secondary | ICD-10-CM

## 2017-03-12 DIAGNOSIS — F32A Depression, unspecified: Secondary | ICD-10-CM

## 2017-03-12 NOTE — Progress Notes (Signed)
Entered in error

## 2017-03-12 NOTE — Progress Notes (Signed)
Location:   [facility] Room Number: 107 A Place of Service:  SNF (31)   CODE STATUS:  Full Code  No Known Allergies  Chief Complaint  Patient presents with  . Medical Management of Chronic Issues    Pba; depression; history of prostate cancer    HPI:  He is a 83 year old long term resident of this facility being seen for the management of his chronic illnesses; depression; pba; prostate cancer history. He is unable to participate in the hpi or ros. There are no reports of changes in behavior; no changes in appetite; no indications of pain present. There are no nursing concerns at this time.   Past Medical History:  Diagnosis Date  . Aortic insufficiency 07/04/2011   2/13 Study Conclusions  - Left ventricle: Systolic function was normal. The estimated ejection fraction was in the range of 55% to 60%. - Aortic valve: Moderate regurgitation. - Aorta: The aorta was moderately dilated. Transthoracic echocardiography. M-mode, complete 2D, spectral Doppler, and color Doppler. Height: Height: 170.2cm. Height: 67in. Weight: Weight: 84.1kg. Weight: 185lb. Body mass index: BMI: 29kg/m^2. Body surface area: BSA: 2.74m^2. Blood pressure: 175/86. Patient status: Inpatient. Location: Bedside.      . Aortic root dilatation (HCC)    Noted on 09/2010 echo, with mild-mod AI  . CEREBROVASCULAR ACCIDENT, HX OF 02/09/2007  . DEMENTIA 05/12/2007  . Hemorrhoids   . HYPERGLYCEMIA 09/21/2009  . HYPERTENSION 12/29/2006  . Lactose intolerance   . OBSTRUCTIVE SLEEP APNEA 12/29/2006  . PROSTATE CANCER, HX OF 12/29/2006  . Prostatitis   . Risk for falls   . Seizures ([place])     Past Surgical History:  Procedure Laterality Date  . CHOLECYSTECTOMY    . GANGLION CYST EXCISION    . PROSTATE SURGERY     cryotherapy    Social History   Socioeconomic History  . Marital status: Married    Spouse name: Not on file  . Number of children: 2  . Years of education: master's  . Highest education level: Not  on file  Social Needs  . Financial resource strain: Not on file  . Food insecurity - worry: Not on file  . Food insecurity - inability: Not on file  . Transportation needs - medical: Not on file  . Transportation needs - non-medical: Not on file  Occupational History  . Occupation: retired  Tobacco Use  . Smoking status: Never Smoker  . Smokeless tobacco: Never Used  Substance and Sexual Activity  . Alcohol use: No  . Drug use: No  . Sexual activity: Not on file  Other Topics Concern  . Not on file  Social History Narrative   Married 52 years in 2016 to wife [name] (goes to [place] urgent care). 2 grown sons. 2 grandkids-boy and girl.       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       Patient does not drink caffeine.   Patient is right handed.   Family History  Problem Relation Age of Onset  . Heart attack Father   . Heart disease Father   . Cancer Sister       VITAL SIGNS BP (!) 190/88   Pulse 65   Temp 98.7 F (37.1 C)   Resp 20   Ht 5\' 7"  (1.702 m)   Wt 135 lb 11.2 oz (61.6 kg)   SpO2 94%   BMI 21.25 kg/m    Recheck b/p 138/80    Medication List  Accurate as of 03/12/17 10:43 AM. Always use your most recent med list.          * acetaminophen 650 MG suppository Commonly known as:  TYLENOL   * acetaminophen 500 MG tablet Commonly known as:  TYLENOL Take 2 tablets (1,000 mg total) by mouth every 6 (six) hours as needed.   betamethasone dipropionate 0.05 % cream Commonly known as:  DIPROLENE   clopidogrel 75 MG tablet Commonly known as:  PLAVIX take 1 tablet by mouth once daily   escitalopram 5 MG tablet Commonly known as:  LEXAPRO   eucerin lotion   felodipine 10 MG 24 hr tablet Commonly known as:  PLENDIL   HYDRALAZINE HCL PO   levETIRAcetam 750 MG tablet Commonly known as:  KEPPRA   NUEDEXTA 20-10 MG Caps Generic drug:  Dextromethorphan-Quinidine   NUTRITIONAL SUPPLEMENT Liqd   olmesartan 40 MG tablet Commonly  known as:  BENICAR      * This list has 2 medication(s) that are the same as other medications prescribed for you. Read the directions carefully, and ask your doctor or other care provider to review them with you.           SIGNIFICANT DIAGNOSTIC EXAMS  NO NEW EXAMS    LABS REVIEWED:    03-19-16: wbc 6.4; hgb 12.0; hct 37.2; mcv 89.7 ;plt 210; glucose 78; bun 19.5; creat 0.93; k+ 4.1; na++ 148  07-23-16: wbc 4.9; hgb 12.4; hct 36.8; mcv 89.;6 plt 181; glucose 81; bun 15.6; creat 0.77; k+ 4.0; na++ 143; ca 8.9; liver normal albumin 3.7   NO NEW LABS   Review of Systems  Unable to perform ROS: Dementia (unable to answer questions )   Physical Exam  Constitutional: No distress.  frai   Neck: Neck supple. No thyromegaly present.  Cardiovascular: Regular rhythm and intact distal pulses.  Murmur heard. 1/6  Pulmonary/Chest: Effort normal and breath sounds normal. No respiratory distress.  Abdominal: Soft. Bowel sounds are normal. He exhibits no distension. There is no tenderness.  Musculoskeletal: He exhibits no edema.  Able to move all extremities   Lymphadenopathy:    He has no cervical adenopathy.  Neurological: He is alert.  Skin: Skin is warm. He is not diaphoretic.  Psychiatric: He has a normal mood and affect.    ASSESSMENT/ PLAN:  TODAY  1. PBA: will continue nuedexta 20-10 mg twice daily and will monitor   2. Depression: will continue lexapro 5 mg daily   3. history of prostate cancer: is status post cryotherapy (06/2014): with [facility]   PREVIOUS   4. Hypertension: has aortic insufficiency: is stable  b/p 138/80   will continue benicar 20 mg daily;  plendil  10 mg daily and  apresoline to 75 mg three times daily   5. Seizure: is stable no further seizure activity will continue  keppra to 500 mg twice daily    6. CVA: is neurologically stable will continue plavix 75 mg daily   7. Alzheimer's disease: is without significant change in status; is  presently not on medications; will not make changes at this time;his current weight is 135 pounds; his weight in Dec 2017 was 142 pounds. He has completed his remeron therapy.     MD is aware of resident's narcotic use and is in agreement with current plan of care. We will attempt to wean resident as apropriate    Ok Edwards NP [facility]  Contact [phone] Monday through Friday 8am- 5pm  After  hours call [phone]

## 2017-04-01 ENCOUNTER — Non-Acute Institutional Stay (SKILLED_NURSING_FACILITY): Payer: Medicare Other | Admitting: Adult Health

## 2017-04-01 ENCOUNTER — Encounter: Payer: Self-pay | Admitting: Adult Health

## 2017-04-01 DIAGNOSIS — F0280 Dementia in other diseases classified elsewhere without behavioral disturbance: Secondary | ICD-10-CM | POA: Diagnosis not present

## 2017-04-01 DIAGNOSIS — I639 Cerebral infarction, unspecified: Secondary | ICD-10-CM | POA: Diagnosis not present

## 2017-04-01 DIAGNOSIS — G301 Alzheimer's disease with late onset: Secondary | ICD-10-CM

## 2017-04-01 DIAGNOSIS — F329 Major depressive disorder, single episode, unspecified: Secondary | ICD-10-CM

## 2017-04-01 DIAGNOSIS — F32A Depression, unspecified: Secondary | ICD-10-CM

## 2017-04-01 NOTE — Progress Notes (Signed)
Location:   [facility] Room Number: 107 A Place of Service:  SNF (31)   CODE STATUS: Full Code  No Known Allergies  Chief Complaint  Patient presents with  . Acute Visit    Care Plan Meeting    HPI:  We have come together with the care plan team and family; he is unable to participate. His wife did have nursing concerns which have been addressed. We did discuss his medical status; he does not have any facial drooping present. We did discuss hie mental health; his wife feels as though he will benefit from increasing his lexapro to manage his anxiety and depression. We did discuss his MOST form and advanced care planning. His wife would like for therapy to see him for better leg strength.    Past Medical History:  Diagnosis Date  . Aortic insufficiency 07/04/2011   2/13 Study Conclusions  - Left ventricle: Systolic function was normal. The estimated ejection fraction was in the range of 55% to 60%. - Aortic valve: Moderate regurgitation. - Aorta: The aorta was moderately dilated. Transthoracic echocardiography. M-mode, complete 2D, spectral Doppler, and color Doppler. Height: Height: 170.2cm. Height: 67in. Weight: Weight: 84.1kg. Weight: 185lb. Body mass index: BMI: 29kg/m^2. Body surface area: BSA: 2.4m^2. Blood pressure: 175/86. Patient status: Inpatient. Location: Bedside.      . Aortic root dilatation (HCC)    Noted on 09/2010 echo, with mild-mod AI  . CEREBROVASCULAR ACCIDENT, HX OF 02/09/2007  . DEMENTIA 05/12/2007  . Hemorrhoids   . HYPERGLYCEMIA 09/21/2009  . HYPERTENSION 12/29/2006  . Lactose intolerance   . OBSTRUCTIVE SLEEP APNEA 12/29/2006  . PROSTATE CANCER, HX OF 12/29/2006  . Prostatitis   . Risk for falls   . Seizures ([place])     Past Surgical History:  Procedure Laterality Date  . CHOLECYSTECTOMY    . GANGLION CYST EXCISION    . PROSTATE SURGERY     cryotherapy    Social History   Socioeconomic History  . Marital status: Married    Spouse name: Not  on file  . Number of children: 2  . Years of education: master's  . Highest education level: Not on file  Social Needs  . Financial resource strain: Not on file  . Food insecurity - worry: Not on file  . Food insecurity - inability: Not on file  . Transportation needs - medical: Not on file  . Transportation needs - non-medical: Not on file  Occupational History  . Occupation: retired  Tobacco Use  . Smoking status: Never Smoker  . Smokeless tobacco: Never Used  Substance and Sexual Activity  . Alcohol use: No  . Drug use: No  . Sexual activity: Not on file  Other Topics Concern  . 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       Patient does not drink caffeine.   Patient is right handed.   Family History  Problem Relation Age of Onset  . Heart attack Father   . Heart disease Father   . Cancer Sister       VITAL SIGNS BP 138/72   Pulse 63   Temp 98.7 F (37.1 C)   Resp 20   Ht 5\' 7"  (1.702 m)   Wt 138 lb 6.4 oz (62.8 kg)   SpO2 95%   BMI 21.68 kg/m   Outpatient Encounter Medications as of 04/01/2017  Medication Sig  . acetaminophen (TYLENOL) 500 MG tablet Take 2 tablets (  1,000 mg total) by mouth every 6 (six) hours as needed.  Marland Kitchen acetaminophen (TYLENOL) 650 MG suppository Place 650 mg rectally every 4 (four) hours as needed for fever. Do not exceed 3000 mg in 24 hours  . betamethasone dipropionate (DIPROLENE) 0.05 % cream Apply 1 application topically daily. Apply to Groin area  . clopidogrel (PLAVIX) 75 MG tablet take 1 tablet by mouth once daily  . Dextromethorphan-Quinidine (NUEDEXTA) 20-10 MG CAPS Take 1 capsule by mouth 2 (two) times daily.  . Emollient (EUCERIN) lotion Apply to Dry skin topically two times daily  . escitalopram (LEXAPRO) 5 MG tablet Take 5 mg by mouth daily. For depression  . felodipine (PLENDIL) 10 MG 24 hr tablet Take 10 mg by mouth daily.  Marland Kitchen HYDRALAZINE HCL PO Give 100 mg by mouth three times a  day for Prophylaxis  . levETIRAcetam (KEPPRA) 750 MG tablet Take 750 mg by mouth 2 (two) times daily.  Marland Kitchen NUTRITIONAL SUPPLEMENT LIQD House supplement - Med Pass - Give 120 cc by mouth three times daily Nutritional treat - Give 4 ounces by mouth three times daily  . Nutritional Supplements (NUTRITIONAL SUPPLEMENT PO) HSG NAS Diet - HSG Mech Soft Texture, Regular consistency  . olmesartan (BENICAR) 40 MG tablet Take 40 mg by mouth daily.   No facility-administered encounter medications on file as of 04/01/2017.      SIGNIFICANT DIAGNOSTIC EXAMS  NO NEW EXAMS    LABS REVIEWED:    03-19-16: wbc 6.4; hgb 12.0; hct 37.2; mcv 89.7 ;plt 210; glucose 78; bun 19.5; creat 0.93; k+ 4.1; na++ 148  07-23-16: wbc 4.9; hgb 12.4; hct 36.8; mcv 89.;6 plt 181; glucose 81; bun 15.6; creat 0.77; k+ 4.0; na++ 143; ca 8.9; liver normal albumin 3.7   TODAY:   10-23-16: wbc 9.8; hgb 12.8; hct 37.1; mcv 87.6; plt 125; glucose 131; bun 19.5; creat 0.93; k+  3.6; na++ 150; ca 8.5  liver normal albumin 3.3 urine culture proteus mirabilis: rocephin   Review of Systems  Unable to perform ROS: Dementia (unable to answer questions )   Physical Exam  Constitutional: No distress.  Frail   Neck: No thyromegaly present.  Cardiovascular: Normal rate, regular rhythm and intact distal pulses.  Murmur heard. Pulmonary/Chest: Effort normal and breath sounds normal. No respiratory distress.  Abdominal: Soft. Bowel sounds are normal. He exhibits no distension. There is no tenderness.  Musculoskeletal: He exhibits no edema.  Able to move all extremities   Lymphadenopathy:    He has no cervical adenopathy.  Neurological: He is alert.  Skin: Skin is warm and dry. He is not diaphoretic.   ASSESSMENT/ PLAN:  TODAY  1. CVA 2. Alzheimer's disease 3. Depression  Time spent with family patient and care plan team: 45 minutes (20 minutes spent on MOST form and advanced care planning) discussed medical status; therapy and  medications: MOST form: DNR: limited hospitalization; abt; ivf; no feeding tube.   GDR:  Will increase lexapro to 10 mg daily and will monitor    MD is aware of resident's narcotic use and is in agreement with current plan of care. We will attempt to wean resident as apropriate     Ok Edwards NP [facility]  Contact [phone] Monday through Friday 8am- 5pm  After hours call [phone]

## 2017-04-08 DIAGNOSIS — R630 Anorexia: Secondary | ICD-10-CM | POA: Diagnosis not present

## 2017-04-08 DIAGNOSIS — G309 Alzheimer's disease, unspecified: Secondary | ICD-10-CM | POA: Diagnosis not present

## 2017-04-08 DIAGNOSIS — F482 Pseudobulbar affect: Secondary | ICD-10-CM | POA: Diagnosis not present

## 2017-04-08 DIAGNOSIS — F329 Major depressive disorder, single episode, unspecified: Secondary | ICD-10-CM | POA: Diagnosis not present

## 2017-04-09 DIAGNOSIS — H2513 Age-related nuclear cataract, bilateral: Secondary | ICD-10-CM | POA: Diagnosis not present

## 2017-04-09 DIAGNOSIS — Z7952 Long term (current) use of systemic steroids: Secondary | ICD-10-CM | POA: Diagnosis not present

## 2017-04-10 ENCOUNTER — Non-Acute Institutional Stay (SKILLED_NURSING_FACILITY): Payer: Medicare Other | Admitting: Adult Health

## 2017-04-10 ENCOUNTER — Encounter: Payer: Self-pay | Admitting: Adult Health

## 2017-04-10 DIAGNOSIS — F0280 Dementia in other diseases classified elsewhere without behavioral disturbance: Secondary | ICD-10-CM | POA: Diagnosis not present

## 2017-04-10 DIAGNOSIS — G301 Alzheimer's disease with late onset: Secondary | ICD-10-CM | POA: Diagnosis not present

## 2017-04-10 DIAGNOSIS — R569 Unspecified convulsions: Secondary | ICD-10-CM | POA: Diagnosis not present

## 2017-04-10 DIAGNOSIS — I10 Essential (primary) hypertension: Secondary | ICD-10-CM

## 2017-04-10 DIAGNOSIS — I639 Cerebral infarction, unspecified: Secondary | ICD-10-CM

## 2017-04-10 NOTE — Progress Notes (Signed)
Location:   [place] Room Number: 107 A Place of Service:  SNF (31)   CODE STATUS: DNR  No Known Allergies  Chief Complaint  Patient presents with  . Medical Management of Chronic Issues    Hypertension; seizure; cva; alzheimer's disease    HPI:  He is a 83 year old long term resident of this facility being seen for the management of his chronic illnesses: essential hypertension; alzheimer's dementia; seizures; cva.  He is unable to fully participate in the hpi or ros. There are no reports of any changes in appetite; behavioral issues; or uncontrolled pain. There are no nursing concerns at this time.    Past Medical History:  Diagnosis Date  . Aortic insufficiency 07/04/2011   2/13 Study Conclusions  - Left ventricle: Systolic function was normal. The estimated ejection fraction was in the range of 55% to 60%. - Aortic valve: Moderate regurgitation. - Aorta: The aorta was moderately dilated. Transthoracic echocardiography. M-mode, complete 2D, spectral Doppler, and color Doppler. Height: Height: 170.2cm. Height: 67in. Weight: Weight: 84.1kg. Weight: 185lb. Body mass index: BMI: 29kg/m^2. Body surface area: BSA: 2.33m^2. Blood pressure: 175/86. Patient status: Inpatient. Location: Bedside.      . Aortic root dilatation (HCC)    Noted on 09/2010 echo, with mild-mod AI  . CEREBROVASCULAR ACCIDENT, HX OF 02/09/2007  . DEMENTIA 05/12/2007  . Hemorrhoids   . HYPERGLYCEMIA 09/21/2009  . HYPERTENSION 12/29/2006  . Lactose intolerance   . OBSTRUCTIVE SLEEP APNEA 12/29/2006  . PROSTATE CANCER, HX OF 12/29/2006  . Prostatitis   . Risk for falls   . Seizures ([place])     Past Surgical History:  Procedure Laterality Date  . CHOLECYSTECTOMY    . GANGLION CYST EXCISION    . PROSTATE SURGERY     cryotherapy    Social History   Socioeconomic History  . Marital status: Married    Spouse name: Not on file  . Number of children: 2  . Years of education: master's  . Highest  education level: Not on file  Social Needs  . Financial resource strain: Not on file  . Food insecurity - worry: Not on file  . Food insecurity - inability: Not on file  . Transportation needs - medical: Not on file  . Transportation needs - non-medical: Not on file  Occupational History  . Occupation: retired  Tobacco Use  . Smoking status: Never Smoker  . Smokeless tobacco: Never Used  Substance and Sexual Activity  . Alcohol use: No  . Drug use: No  . Sexual activity: Not on file  Other Topics Concern  . 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       Patient does not drink caffeine.   Patient is right handed.   Family History  Problem Relation Age of Onset  . Heart attack Father   . Heart disease Father   . Cancer Sister       VITAL SIGNS BP 127/73   Pulse 68   Temp 97.9 F (36.6 C)   Resp 16   Ht 5\' 7"  (1.702 m)   Wt 138 lb 6.4 oz (62.8 kg)   SpO2 94%   BMI 21.68 kg/m   Outpatient Encounter Medications as of 04/10/2017  Medication Sig  . acetaminophen (TYLENOL) 500 MG tablet Take 2 tablets (1,000 mg total) by mouth every 6 (six) hours as needed.  Marland Kitchen acetaminophen (TYLENOL) 650 MG suppository Place 650 mg rectally  every 4 (four) hours as needed for fever. Do not exceed 3000 mg in 24 hours  . betamethasone dipropionate (DIPROLENE) 0.05 % cream Apply 1 application topically daily. Apply to Groin area  . clopidogrel (PLAVIX) 75 MG tablet take 1 tablet by mouth once daily  . Dextromethorphan-Quinidine (NUEDEXTA) 20-10 MG CAPS Take 1 capsule by mouth 2 (two) times daily.  . Emollient (EUCERIN) lotion Apply to Dry skin topically two times daily  . escitalopram (LEXAPRO) 5 MG tablet Take 5 mg by mouth daily. For depression  . felodipine (PLENDIL) 10 MG 24 hr tablet Take 10 mg by mouth daily.  Marland Kitchen HYDRALAZINE HCL PO Give 100 mg by mouth three times a day for Prophylaxis  . levETIRAcetam (KEPPRA) 750 MG tablet Take 750 mg by  mouth 2 (two) times daily.  Marland Kitchen NUTRITIONAL SUPPLEMENT LIQD House supplement - Med Pass - Give 120 cc by mouth three times daily Nutritional treat - Give 4 ounces by mouth three times daily  . Nutritional Supplements (NUTRITIONAL SUPPLEMENT PO) HSG NAS Diet - HSG Mech Soft Texture, Regular consistency  . olmesartan (BENICAR) 40 MG tablet Take 40 mg by mouth daily.   No facility-administered encounter medications on file as of 04/10/2017.      SIGNIFICANT DIAGNOSTIC EXAMS  NO NEW EXAMS    LABS REVIEWED:   07-23-16: wbc 4.9; hgb 12.4; hct 36.8; mcv 89.;6 plt 181; glucose 81; bun 15.6; creat 0.77; k+ 4.0; na++ 143; ca 8.9; liver normal albumin 3.7  10-23-16: wbc 9.8; hgb 12.8; hct 37.1; mcv 87.6; plt 125; glucose 131; bun 19.5; creat 0.93; k+  3.6; na++ 150; ca 8.5  liver normal albumin 3.3 urine culture proteus mirabilis: rocephin   NO NEW LABS    Review of Systems  Unable to perform ROS: Dementia (unable to answer questions )    Physical Exam  Constitutional: No distress.  Frail   Neck: No thyromegaly present.  Cardiovascular: Normal rate and regular rhythm.  Murmur heard. 1/6  Pulmonary/Chest: Effort normal and breath sounds normal. No respiratory distress.  Abdominal: Soft. Bowel sounds are normal. He exhibits no distension. There is no tenderness.  Musculoskeletal: He exhibits no edema.  Able to move all extremities   Lymphadenopathy:    He has no cervical adenopathy.  Neurological: He is alert.  Skin: Skin is dry. He is not diaphoretic.  .    ASSESSMENT/ PLAN:  TODAY  1. Hypertension: has aortic insufficiency: is stable  b/p 127/73   will continue benicar 20 mg daily;  plendil  10 mg daily and  apresoline  75 mg three times daily   2. Seizure: is stable no further seizure activity will continue  keppra to 500 mg twice daily    3. CVA: is neurologically stable will continue plavix 75 mg daily   4. Alzheimer's disease: is without significant change in status; is  presently not on medications; will not make changes at this time;his current weight is 138 pounds; his weight in Dec 2017 was 142 pounds. He has completed his remeron therapy.   PREVIOUS   5. PBA: will continue nuedexta 20-10 mg twice daily and will monitor   6. Depression: will continue lexapro 10 mg daily GDR done 04-01-17  7. history of prostate cancer: is status post cryotherapy (06/2014): with [facility]   Will check cbc; cmp hgb a1c   MD is aware of resident's narcotic use and is in agreement with current plan of care. We will attempt to wean resident as  apropriate     Ok Edwards NP [facility]  Contact [phone] Monday through Friday 8am- 5pm  After hours call [phone]

## 2017-04-14 LAB — CBC AND DIFFERENTIAL
HCT: 38 — AB (ref 41–53)
Hemoglobin: 12.6 — AB (ref 13.5–17.5)
NEUTROS ABS: 4
PLATELETS: 229 (ref 150–399)
WBC: 6.3

## 2017-04-14 LAB — BASIC METABOLIC PANEL
BUN: 22 — AB (ref 4–21)
Creatinine: 1.1 (ref 0.6–1.3)
GLUCOSE: 120
Potassium: 4.1 (ref 3.4–5.3)
SODIUM: 150 — AB (ref 137–147)

## 2017-04-14 LAB — HEPATIC FUNCTION PANEL
ALK PHOS: 125 (ref 25–125)
ALT: 15 (ref 10–40)
AST: 15 (ref 14–40)
Bilirubin, Total: 0.4

## 2017-04-14 LAB — HEMOGLOBIN A1C: Hemoglobin A1C: 5.6

## 2017-05-12 ENCOUNTER — Encounter: Payer: Self-pay | Admitting: Adult Health

## 2017-05-12 ENCOUNTER — Non-Acute Institutional Stay (SKILLED_NURSING_FACILITY): Payer: Medicare Other | Admitting: Adult Health

## 2017-05-12 DIAGNOSIS — F482 Pseudobulbar affect: Secondary | ICD-10-CM | POA: Diagnosis not present

## 2017-05-12 DIAGNOSIS — Z8546 Personal history of malignant neoplasm of prostate: Secondary | ICD-10-CM

## 2017-05-12 DIAGNOSIS — F329 Major depressive disorder, single episode, unspecified: Secondary | ICD-10-CM | POA: Diagnosis not present

## 2017-05-12 DIAGNOSIS — F32A Depression, unspecified: Secondary | ICD-10-CM

## 2017-05-12 NOTE — Progress Notes (Signed)
Location:   [facility] Number: 107 [id number] Place of Service:  SNF (31)   CODE STATUS: DNR  No Known Allergies  Chief Complaint  Patient presents with  . Medical Management of Chronic Issues    Pba; prostate cancer; depression    HPI:  He is a 84 year old long term resident of this facility being seen for the management of his chronic illnesses: pba; prostate cancer; depression. He is unable to participate in the hpi or ros. There are no reports of changes in behaviors; no changes in appetite; no reports of uncontrolled pain. There are no nursing concerns at this time.   Past Medical History:  Diagnosis Date  . Aortic insufficiency 07/04/2011   2/13 Study Conclusions  - Left ventricle: Systolic function was normal. The estimated ejection fraction was in the range of 55% to 60%. - Aortic valve: Moderate regurgitation. - Aorta: The aorta was moderately dilated. Transthoracic echocardiography. M-mode, complete 2D, spectral Doppler, and color Doppler. Height: Height: 170.2cm. Height: 67in. Weight: Weight: 84.1kg. Weight: 185lb. Body mass index: BMI: 29kg/m^2. Body surface area: BSA: 2.63m^2. Blood pressure: 175/86. Patient status: Inpatient. Location: Bedside.      . Aortic root dilatation (HCC)    Noted on 09/2010 echo, with mild-mod AI  . CEREBROVASCULAR ACCIDENT, HX OF 02/09/2007  . DEMENTIA 05/12/2007  . Hemorrhoids   . HYPERGLYCEMIA 09/21/2009  . HYPERTENSION 12/29/2006  . Lactose intolerance   . OBSTRUCTIVE SLEEP APNEA 12/29/2006  . PROSTATE CANCER, HX OF 12/29/2006  . Prostatitis   . Risk for falls   . Seizures ([place])     Past Surgical History:  Procedure Laterality Date  . CHOLECYSTECTOMY    . GANGLION CYST EXCISION    . PROSTATE SURGERY     cryotherapy    Social History   Socioeconomic History  . Marital status: Married    Spouse name: Not on file  . Number of children: 2  . Years of education: master's  . Highest education level: Not on file  Social Needs    . Financial resource strain: Not on file  . Food insecurity - worry: Not on file  . Food insecurity - inability: Not on file  . Transportation needs - medical: Not on file  . Transportation needs - non-medical: Not on file  Occupational History  . Occupation: retired  Tobacco Use  . Smoking status: Never Smoker  . Smokeless tobacco: Never Used  Substance and Sexual Activity  . Alcohol use: No  . Drug use: No  . Sexual activity: Not on file  Other Topics Concern  . 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       Patient does not drink caffeine.   Patient is right handed.   Family History  Problem Relation Age of Onset  . Heart attack Father   . Heart disease Father   . Cancer Sister       VITAL SIGNS BP (!) 144/88   Pulse 80   Temp 98.7 F (37.1 C)   Resp 18   Ht 5\' 7"  (1.702 m)   Wt 145 lb 9.6 oz (66 kg)   SpO2 97%   BMI 22.80 kg/m   Outpatient Encounter Medications as of 05/12/2017  Medication Sig  . acetaminophen (TYLENOL) 500 MG tablet Take 2 tablets (1,000 mg total) by mouth every 6 (six) hours as needed.  Marland Kitchen acetaminophen (TYLENOL) 650 MG suppository Place 650 mg rectally every  4 (four) hours as needed for fever. Do not exceed 3000 mg in 24 hours  . betamethasone dipropionate (DIPROLENE) 0.05 % cream Apply 1 application topically daily. Apply to Groin area  . clopidogrel (PLAVIX) 75 MG tablet take 1 tablet by mouth once daily  . Dextromethorphan-Quinidine (NUEDEXTA) 20-10 MG CAPS Take 1 capsule by mouth 2 (two) times daily.  . Emollient (EUCERIN) lotion Apply to Dry skin topically two times daily  . escitalopram (LEXAPRO) 5 MG tablet Take 5 mg by mouth daily. For depression  . felodipine (PLENDIL) 10 MG 24 hr tablet Take 10 mg by mouth daily.  Marland Kitchen HYDRALAZINE HCL PO Give 100 mg by mouth three times a day for Prophylaxis  . levETIRAcetam (KEPPRA) 750 MG tablet Take 750 mg by mouth 2 (two) times daily.  Marland Kitchen NUTRITIONAL  SUPPLEMENT LIQD House supplement - Med Pass - Give 120 cc by mouth three times daily Nutritional treat - Give 4 ounces by mouth three times daily  . Nutritional Supplements (NUTRITIONAL SUPPLEMENT PO) HSG NAS Diet - HSG Mech Soft Texture, Regular consistency  . olmesartan (BENICAR) 40 MG tablet Take 40 mg by mouth daily.   No facility-administered encounter medications on file as of 05/12/2017.      SIGNIFICANT DIAGNOSTIC EXAMS  NO NEW EXAMS    LABS REVIEWED:   07-23-16: wbc 4.9; hgb 12.4; hct 36.8; mcv 89.;6 plt 181; glucose 81; bun 15.6; creat 0.77; k+ 4.0; na++ 143; ca 8.9; liver normal albumin 3.7  10-23-16: wbc 9.8; hgb 12.8; hct 37.1; mcv 87.6; plt 125; glucose 131; bun 19.5; creat 0.93; k+  3.6; na++ 150; ca 8.5  liver normal albumin 3.3 urine culture proteus mirabilis: rocephin   NO NEW LABS    Review of Systems  Unable to perform ROS: Dementia (unable to participate )    Physical Exam  Constitutional: No distress.  Frail   Neck: No thyromegaly present.  Cardiovascular: Normal rate, regular rhythm and intact distal pulses.  Murmur heard. 1/6  Pulmonary/Chest: Effort normal and breath sounds normal. No respiratory distress.  Abdominal: Soft. Bowel sounds are normal. He exhibits no distension. There is no tenderness.  Musculoskeletal: He exhibits no edema.  Is able to move all extremities   Lymphadenopathy:    He has no cervical adenopathy.  Neurological: He is alert.  Skin: Skin is warm and dry. He is not diaphoretic.     ASSESSMENT/ PLAN:  TODAY  1. PBA: stable will continue nuedexta 20-10 mg twice daily and will monitor   2. Depression:stable  will continue lexapro 10 mg daily (GDR done 04-01-17)  3. history of prostate cancer: is status post cryotherapy (06/2014): with [facility]   PREVIOUS   4. Essential hypertension: has aortic insufficiency: is stable  b/p 144/88   will continue benicar 40 mg daily;  plendil  10 mg daily and  apresoline  100 mg  three times daily   5. Seizures: is stable no further seizure activity will continue  keppra to 500 mg twice daily    6. CVA: is neurologically stable will continue plavix 75 mg daily   7. Alzheimer's dementia: is without significant change in status; is presently not on medications; will not make changes at this time;his current weight is 145 pounds; his weight in Dec 2017 was 142 pounds. He has completed his remeron therapy.    MD is aware of resident's narcotic use and is in agreement with current plan of care. We will attempt to wean resident as apropriate  Ok Edwards NP [facility]  Contact [phone] Monday through Friday 8am- 5pm  After hours call [phone]

## 2017-05-31 ENCOUNTER — Emergency Department (HOSPITAL_COMMUNITY): Payer: Medicare Other

## 2017-05-31 ENCOUNTER — Other Ambulatory Visit: Payer: Self-pay

## 2017-05-31 ENCOUNTER — Emergency Department (HOSPITAL_COMMUNITY)
Admission: EM | Admit: 2017-05-31 | Discharge: 2017-06-01 | Disposition: A | Discharge status: Home / Self Care | Payer: Medicare Other | Source: Home / Self Care | Attending: Emergency Medicine | Admitting: Emergency Medicine

## 2017-05-31 ENCOUNTER — Encounter (HOSPITAL_COMMUNITY): Payer: Self-pay | Admitting: Emergency Medicine

## 2017-05-31 DIAGNOSIS — R0981 Nasal congestion: Secondary | ICD-10-CM

## 2017-05-31 DIAGNOSIS — J09X2 Influenza due to identified novel influenza A virus with other respiratory manifestations: Secondary | ICD-10-CM | POA: Diagnosis not present

## 2017-05-31 DIAGNOSIS — J1081 Influenza due to other identified influenza virus with encephalopathy: Secondary | ICD-10-CM | POA: Diagnosis not present

## 2017-05-31 DIAGNOSIS — Y93F1 Activity, caregiving, bathing: Secondary | ICD-10-CM | POA: Insufficient documentation

## 2017-05-31 DIAGNOSIS — W06XXXA Fall from bed, initial encounter: Secondary | ICD-10-CM | POA: Insufficient documentation

## 2017-05-31 DIAGNOSIS — S0003XA Contusion of scalp, initial encounter: Secondary | ICD-10-CM

## 2017-05-31 DIAGNOSIS — R9431 Abnormal electrocardiogram [ECG] [EKG]: Secondary | ICD-10-CM | POA: Diagnosis not present

## 2017-05-31 DIAGNOSIS — N390 Urinary tract infection, site not specified: Secondary | ICD-10-CM

## 2017-05-31 DIAGNOSIS — Y998 Other external cause status: Secondary | ICD-10-CM

## 2017-05-31 DIAGNOSIS — J3489 Other specified disorders of nose and nasal sinuses: Secondary | ICD-10-CM | POA: Insufficient documentation

## 2017-05-31 DIAGNOSIS — J101 Influenza due to other identified influenza virus with other respiratory manifestations: Secondary | ICD-10-CM

## 2017-05-31 DIAGNOSIS — G309 Alzheimer's disease, unspecified: Secondary | ICD-10-CM | POA: Insufficient documentation

## 2017-05-31 DIAGNOSIS — Z515 Encounter for palliative care: Secondary | ICD-10-CM | POA: Diagnosis not present

## 2017-05-31 DIAGNOSIS — R05 Cough: Secondary | ICD-10-CM | POA: Diagnosis not present

## 2017-05-31 DIAGNOSIS — R509 Fever, unspecified: Secondary | ICD-10-CM | POA: Diagnosis not present

## 2017-05-31 DIAGNOSIS — J111 Influenza due to unidentified influenza virus with other respiratory manifestations: Secondary | ICD-10-CM | POA: Diagnosis not present

## 2017-05-31 DIAGNOSIS — F0280 Dementia in other diseases classified elsewhere without behavioral disturbance: Secondary | ICD-10-CM | POA: Insufficient documentation

## 2017-05-31 DIAGNOSIS — E43 Unspecified severe protein-calorie malnutrition: Secondary | ICD-10-CM | POA: Diagnosis not present

## 2017-05-31 DIAGNOSIS — M542 Cervicalgia: Secondary | ICD-10-CM | POA: Diagnosis not present

## 2017-05-31 DIAGNOSIS — J9601 Acute respiratory failure with hypoxia: Secondary | ICD-10-CM | POA: Diagnosis not present

## 2017-05-31 DIAGNOSIS — S199XXA Unspecified injury of neck, initial encounter: Secondary | ICD-10-CM | POA: Diagnosis not present

## 2017-05-31 DIAGNOSIS — Y92122 Bedroom in nursing home as the place of occurrence of the external cause: Secondary | ICD-10-CM

## 2017-05-31 DIAGNOSIS — R0902 Hypoxemia: Secondary | ICD-10-CM | POA: Diagnosis not present

## 2017-05-31 DIAGNOSIS — I10 Essential (primary) hypertension: Secondary | ICD-10-CM | POA: Insufficient documentation

## 2017-05-31 DIAGNOSIS — W19XXXA Unspecified fall, initial encounter: Secondary | ICD-10-CM

## 2017-05-31 DIAGNOSIS — R0602 Shortness of breath: Secondary | ICD-10-CM | POA: Diagnosis not present

## 2017-05-31 DIAGNOSIS — J690 Pneumonitis due to inhalation of food and vomit: Secondary | ICD-10-CM | POA: Diagnosis not present

## 2017-05-31 LAB — URINALYSIS, ROUTINE W REFLEX MICROSCOPIC
BILIRUBIN URINE: NEGATIVE
Glucose, UA: NEGATIVE mg/dL
Hgb urine dipstick: NEGATIVE
Ketones, ur: NEGATIVE mg/dL
NITRITE: POSITIVE — AB
PROTEIN: 100 mg/dL — AB
Specific Gravity, Urine: 1.026 (ref 1.005–1.030)
Squamous Epithelial / LPF: NONE SEEN
pH: 5 (ref 5.0–8.0)

## 2017-05-31 LAB — I-STAT CG4 LACTIC ACID, ED: Lactic Acid, Venous: 2.36 mmol/L (ref 0.5–1.9)

## 2017-05-31 LAB — CBC WITH DIFFERENTIAL/PLATELET
BASOS PCT: 0 %
Basophils Absolute: 0 10*3/uL (ref 0.0–0.1)
Eosinophils Absolute: 0 10*3/uL (ref 0.0–0.7)
Eosinophils Relative: 0 %
HEMATOCRIT: 43.2 % (ref 39.0–52.0)
Hemoglobin: 14.3 g/dL (ref 13.0–17.0)
LYMPHS PCT: 10 %
Lymphs Abs: 0.9 10*3/uL (ref 0.7–4.0)
MCH: 28.8 pg (ref 26.0–34.0)
MCHC: 33.1 g/dL (ref 30.0–36.0)
MCV: 87.1 fL (ref 78.0–100.0)
MONOS PCT: 10 %
Monocytes Absolute: 0.9 10*3/uL (ref 0.1–1.0)
NEUTROS ABS: 7.2 10*3/uL (ref 1.7–7.7)
Neutrophils Relative %: 80 %
Platelets: 124 10*3/uL — ABNORMAL LOW (ref 150–400)
RBC: 4.96 MIL/uL (ref 4.22–5.81)
RDW: 14.8 % (ref 11.5–15.5)
WBC: 9 10*3/uL (ref 4.0–10.5)

## 2017-05-31 LAB — COMPREHENSIVE METABOLIC PANEL
ALBUMIN: 3.4 g/dL — AB (ref 3.5–5.0)
ALK PHOS: 114 U/L (ref 38–126)
ALT: 27 U/L (ref 17–63)
ANION GAP: 11 (ref 5–15)
AST: 36 U/L (ref 15–41)
BUN: 12 mg/dL (ref 6–20)
CALCIUM: 8.9 mg/dL (ref 8.9–10.3)
CO2: 27 mmol/L (ref 22–32)
Chloride: 98 mmol/L — ABNORMAL LOW (ref 101–111)
Creatinine, Ser: 1.2 mg/dL (ref 0.61–1.24)
GFR calc Af Amer: >60 mL/min (ref >60)
GFR calc non Af Amer: 54 mL/min — ABNORMAL LOW (ref >60)
GLUCOSE: 177 mg/dL — AB (ref 65–99)
Potassium: 4 mmol/L (ref 3.5–5.1)
SODIUM: 136 mmol/L (ref 135–145)
Total Bilirubin: 0.6 mg/dL (ref 0.3–1.2)
Total Protein: 7.5 g/dL (ref 6.5–8.1)

## 2017-05-31 LAB — PROTIME-INR
INR: 1.09
PROTHROMBIN TIME: 14 s (ref 11.4–15.2)

## 2017-05-31 LAB — INFLUENZA PANEL BY PCR (TYPE A & B)
Influenza A By PCR: POSITIVE — AB
Influenza B By PCR: NEGATIVE

## 2017-05-31 MED ORDER — SODIUM CHLORIDE 0.9 % IV BOLUS (SEPSIS)
1000.0000 mL | Freq: Once | INTRAVENOUS | Status: AC
  Administered 2017-05-31: 1000 mL via INTRAVENOUS

## 2017-05-31 MED ORDER — ACETAMINOPHEN 650 MG RE SUPP
650.0000 mg | Freq: Once | RECTAL | Status: AC
  Administered 2017-05-31: 650 mg via RECTAL

## 2017-05-31 MED ORDER — OSELTAMIVIR PHOSPHATE 6 MG/ML PO SUSR
30.0000 mg | Freq: Two times a day (BID) | ORAL | Status: DC
  Administered 2017-06-01: 30 mg via ORAL
  Filled 2017-05-31: qty 12.5

## 2017-05-31 MED ORDER — OSELTAMIVIR PHOSPHATE 75 MG PO CAPS: 75.0000 mg | ORAL_CAPSULE | Freq: Two times a day (BID) | ORAL | Status: DC

## 2017-05-31 MED ORDER — DEXTROSE 5 % IV SOLN
1.0000 g | Freq: Once | INTRAVENOUS | Status: AC
  Administered 2017-06-01: 1 g via INTRAVENOUS
  Filled 2017-05-31: qty 10

## 2017-05-31 NOTE — ED Provider Notes (Signed)
[facility] EMERGENCY DEPARTMENT Provider Note   CSN: [id number] Arrival date & time: 05/31/17  2100    History   Chief Complaint Chief Complaint  Patient presents with  . Fall  . Fever    LEVEL 5 CAVEAT: DEMETIA  HPI Mike Williams is a 84 y.o. male.  84 year old male with a history of CVA, dementia, hypertension, sleep apnea, aortic insufficiency with aortic root dilatation presents to the emergency department from [facility] facility.  EMS was called after the patient rolled off the bed while attempting to be changed.  This was witnessed by staff.  Patient on Plavix.  He has a large hematoma to his right frontal parietal scalp.  No reported loss of consciousness.  It was also found that patient had a fever of 101.62F at the facility.  Staff at the facility reports that "the flu has been going around".  Level 5 caveat secondary to dementia      Past Medical History:  Diagnosis Date  . Aortic insufficiency 07/04/2011   2/13 Study Conclusions  - Left ventricle: Systolic function was normal. The estimated ejection fraction was in the range of 55% to 60%. - Aortic valve: Moderate regurgitation. - Aorta: The aorta was moderately dilated. Transthoracic echocardiography. M-mode, complete 2D, spectral Doppler, and color Doppler. Height: Height: 170.2cm. Height: 67in. Weight: Weight: 84.1kg. Weight: 185lb. Body mass index: BMI: 29kg/m^2. Body surface area: BSA: 2.62m^2. Blood pressure: 175/86. Patient status: Inpatient. Location: Bedside.      . Aortic root dilatation (HCC)    Noted on 09/2010 echo, with mild-mod AI  . CEREBROVASCULAR ACCIDENT, HX OF 02/09/2007  . DEMENTIA 05/12/2007  . Hemorrhoids   . HYPERGLYCEMIA 09/21/2009  . HYPERTENSION 12/29/2006  . Lactose intolerance   . OBSTRUCTIVE SLEEP APNEA 12/29/2006  . PROSTATE CANCER, HX OF 12/29/2006  . Prostatitis   . Risk for falls   . Seizures [facility])     Patient Active Problem List   Diagnosis Date Noted  . CVA  (cerebral vascular accident) ([place]) 11/28/2016  . Depression 12/01/2015  . PBA (pseudobulbar affect) 08/08/2015  . Overactive bladder 06/29/2014  . Seizures ([place]) 02/01/2013  . Aortic insufficiency 07/04/2011  . Alzheimer's dementia 05/12/2007  . Obstructive sleep apnea 12/29/2006  . Essential hypertension 12/29/2006  . PROSTATE CANCER, HX OF 12/29/2006    Past Surgical History:  Procedure Laterality Date  . CHOLECYSTECTOMY    . GANGLION CYST EXCISION    . PROSTATE SURGERY     cryotherapy       Home Medications    Prior to Admission medications   Medication Sig Start Date End Date Taking? Authorizing Provider  acetaminophen (TYLENOL) 500 MG tablet Take 2 tablets (1,000 mg total) by mouth every 6 (six) hours as needed. 05/30/16   Charlesetta Shanks, MD  acetaminophen (TYLENOL) 650 MG suppository Place 650 mg rectally every 4 (four) hours as needed for fever. Do not exceed 3000 mg in 24 hours    [provider]  betamethasone dipropionate (DIPROLENE) 0.05 % cream Apply 1 application topically daily. Apply to Groin area    [provider]  clopidogrel (PLAVIX) 75 MG tablet take 1 tablet by mouth once daily 11/28/14   Marin Olp, MD  Dextromethorphan-Quinidine (NUEDEXTA) 20-10 MG CAPS Take 1 capsule by mouth 2 (two) times daily.    [provider]  Emollient (EUCERIN) lotion Apply to Dry skin topically two times daily    [provider]  escitalopram (LEXAPRO) 5 MG tablet Take 5  mg by mouth daily. For depression    [provider]  felodipine (PLENDIL) 10 MG 24 hr tablet Take 10 mg by mouth daily.    [provider]  HYDRALAZINE HCL PO Give 100 mg by mouth three times a day for Prophylaxis    [provider]  levETIRAcetam (KEPPRA) 750 MG tablet Take 750 mg by mouth 2 (two) times daily.    [provider]  NUTRITIONAL SUPPLEMENT LIQD House supplement - Med Pass - Give 120 cc by mouth three times  daily Nutritional treat - Give 4 ounces by mouth three times daily    [provider]  Nutritional Supplements (NUTRITIONAL SUPPLEMENT PO) HSG NAS Diet - HSG Mech Soft Texture, Regular consistency    [provider]  olmesartan (BENICAR) 40 MG tablet Take 40 mg by mouth daily.    [provider]    Family History Family History  Problem Relation Age of Onset  . Heart attack Father   . Heart disease Father   . Cancer Sister     Social History Social History   Tobacco Use  . Smoking status: Never Smoker  . Smokeless tobacco: Never Used  Substance Use Topics  . Alcohol use: No  . Drug use: No     Allergies   Patient has no known allergies.   Review of Systems Review of Systems  Unable to perform ROS: Dementia     Physical Exam Updated Vital Signs BP (!) 154/104 (BP Location: Right Arm)   Pulse 91   Temp (!) 101.3 F (38.5 C) (Rectal)   Resp 16   Ht 6' (1.829 m)   Wt 65.8 kg (145 lb)   SpO2 100%   BMI 19.67 kg/m   Physical Exam  Constitutional: He appears well-developed and well-nourished. No distress.  Patient alert and chronically ill appearing  HENT:  Head: Normocephalic. Head is without raccoon's eyes and without Battle's sign.    Mouth/Throat: Oropharynx is clear and moist.  Eyes: Conjunctivae and EOM are normal. Pupils are equal, round, and reactive to light. No scleral icterus.  Neck:  C-collar in place  Cardiovascular: Normal rate, regular rhythm and intact distal pulses.  Pulmonary/Chest: Effort normal. No stridor. No respiratory distress.  No respiratory distress  Musculoskeletal: Normal range of motion.  Neurological: He is alert. He exhibits normal muscle tone. Coordination normal.  Moving extremities spontaneously.  Skin: Skin is warm and dry. No rash noted. He is not diaphoretic. No erythema. No pallor.  Psychiatric: He has a normal mood and affect. His behavior is normal.  Nursing note and vitals  reviewed.    ED Treatments / Results  Labs (all labs ordered are listed, but only abnormal results are displayed) Labs Reviewed  COMPREHENSIVE METABOLIC PANEL - Abnormal; Notable for the following components:      Result Value   Chloride 98 (*)    Glucose, Bld 177 (*)    Albumin 3.4 (*)    GFR calc non Af Amer 54 (*)    All other components within normal limits  CBC WITH DIFFERENTIAL/PLATELET - Abnormal; Notable for the following components:   Platelets 124 (*)    All other components within normal limits  INFLUENZA PANEL BY PCR (TYPE A & B) - Abnormal; Notable for the following components:   Influenza A By PCR POSITIVE (*)    All other components within normal limits  I-STAT CG4 LACTIC ACID, ED - Abnormal; Notable for the following components:   Lactic Acid,  Venous 2.36 (*)    All other components within normal limits  CULTURE, BLOOD (ROUTINE X 2)  CULTURE, BLOOD (ROUTINE X 2)  URINE CULTURE  PROTIME-INR  URINALYSIS, ROUTINE W REFLEX MICROSCOPIC  I-STAT CG4 LACTIC ACID, ED    EKG  EKG Interpretation None       Radiology Ct Head Wo Contrast  Result Date: 05/31/2017 CLINICAL DATA:  Fall out of bed today. Head and neck trauma. Right forehead hematoma. On blood thinners. Initial encounter. EXAM: CT HEAD WITHOUT CONTRAST CT CERVICAL SPINE WITHOUT CONTRAST TECHNIQUE: Multidetector CT imaging of the head and cervical spine was performed following the standard protocol without intravenous contrast. Multiplanar CT image reconstructions of the cervical spine were also generated. COMPARISON:  Head CT only on 12/23/2015 FINDINGS: CT HEAD FINDINGS Brain: No evidence of acute infarction, hemorrhage, hydrocephalus, extra-axial collection, or mass lesion/mass effect. Moderate diffuse cerebral atrophy and severe chronic small vessel disease again seen. Multiple old lacunar infarcts again seen involving the bilateral basal ganglia and pons. Vascular:  No hyperdense vessel or other acute  findings. Skull: No evidence of fracture or other significant bone abnormality. Sinuses/Orbits:  No acute findings. Other: Mild right frontal scalp hematoma. CT CERVICAL SPINE FINDINGS Alignment: Normal. Skull base and vertebrae: No acute fracture. No primary bone lesion or focal pathologic process. Soft tissues and spinal canal: No prevertebral fluid or swelling. No visible canal hematoma. Disc levels: Mild degenerative disc disease is the this is seen from levels of C2-C7. Mild right-sided facet DJD at C7-T1. Upper chest: Negative. Other: None. IMPRESSION: No acute intracranial abnormality. Stable cerebral atrophy, chronic small vessel disease, and old lacunar infarcts. Mild right frontal scalp hematoma.  No evidence of skull fracture. No evidence of acute cervical spine fracture or subluxation. Degenerative spondylosis, as described above. Electronically Signed   By: Earle Gell [name]   On: 05/31/2017 22:31   Ct Cervical Spine Wo Contrast  Result Date: 05/31/2017 CLINICAL DATA:  Fall out of bed today. Head and neck trauma. Right forehead hematoma. On blood thinners. Initial encounter. EXAM: CT HEAD WITHOUT CONTRAST CT CERVICAL SPINE WITHOUT CONTRAST TECHNIQUE: Multidetector CT imaging of the head and cervical spine was performed following the standard protocol without intravenous contrast. Multiplanar CT image reconstructions of the cervical spine were also generated. COMPARISON:  Head CT only on 12/23/2015 FINDINGS: CT HEAD FINDINGS Brain: No evidence of acute infarction, hemorrhage, hydrocephalus, extra-axial collection, or mass lesion/mass effect. Moderate diffuse cerebral atrophy and severe chronic small vessel disease again seen. Multiple old lacunar infarcts again seen involving the bilateral basal ganglia and pons. Vascular:  No hyperdense vessel or other acute findings. Skull: No evidence of fracture or other significant bone abnormality. Sinuses/Orbits:  No acute findings. Other: Mild right frontal  scalp hematoma. CT CERVICAL SPINE FINDINGS Alignment: Normal. Skull base and vertebrae: No acute fracture. No primary bone lesion or focal pathologic process. Soft tissues and spinal canal: No prevertebral fluid or swelling. No visible canal hematoma. Disc levels: Mild degenerative disc disease is the this is seen from levels of C2-C7. Mild right-sided facet DJD at C7-T1. Upper chest: Negative. Other: None. IMPRESSION: No acute intracranial abnormality. Stable cerebral atrophy, chronic small vessel disease, and old lacunar infarcts. Mild right frontal scalp hematoma.  No evidence of skull fracture. No evidence of acute cervical spine fracture or subluxation. Degenerative spondylosis, as described above. Electronically Signed   By: Earle Gell [name]   On: 05/31/2017 22:31   Dg Chest Port 1 View  Result Date: 05/31/2017  CLINICAL DATA:  Patient with fever. EXAM: PORTABLE CHEST 1 VIEW COMPARISON:  Chest radiograph 01/12/2012. FINDINGS: Monitoring leads overlie the patient. Stable cardiomegaly. No consolidative pulmonary opacities. No pleural effusion or pneumothorax. IMPRESSION: No acute cardiopulmonary process. Electronically Signed   By: Lovey Newcomer [name]   On: 05/31/2017 21:56    Procedures Procedures (including critical care time)  Medications Ordered in ED Medications  oseltamivir (TAMIFLU) capsule 75 mg (not administered)  acetaminophen (TYLENOL) suppository 650 mg (650 mg Rectal Given 05/31/17 2155)  sodium chloride 0.9 % bolus 1,000 mL (1,000 mLs Intravenous New Bag/Given 05/31/17 2224)    And  sodium chloride 0.9 % bolus 1,000 mL (1,000 mLs Intravenous New Bag/Given 05/31/17 2224)    11:36 PM Patient reassessed.  He is sleeping and in no distress.  Collar removed.  Heart rate in the 70s with blood pressure of 168/89. SpO2 97% on room air. Will tx here with Tamiflu and IV Rocephin. Plan for discharge back to [facility] with Rx for Tamiflu and Keflex.   Initial Impression / Assessment and Plan /  ED Course  I have reviewed the triage vital signs and the nursing notes.  Pertinent labs & imaging results that were available during my care of the patient were reviewed by me and considered in my medical decision making (see chart for details).     84 year old male presents to the emergency department after a fall at [facility] facility.  Patient also noted to be febrile prior to arrival.  Temperature in the emergency department 101.3 F rectally.  Fever suspected to be multifactorial as patient positive for urinary tract infection as well as influenza.  He has been given a dose of IV Rocephin in the emergency department as well as a dose of Tamiflu.    Lactic acid level initially slightly elevated.  It has cleared with IV fluid hydration.  Patient has no leukocytosis or left shift.  Head and cervical spine CTs reassuring.  Chest x-ray also negative for pneumonia or focal consolidation.  Fever responding appropriately to antipyretics.  Patient has not been hypotensive nor has he experienced tachycardia.  I believe that attempted outpatient management is appropriate for this patient at this time.  Will discharge with Keflex and Tamiflu.  Primary care follow-up advised and return precautions given.  Patient discharged in satisfactory condition via [facility].   Final Clinical Impressions(s) / ED Diagnoses   Final diagnoses:  Hematoma of scalp, initial encounter  Fall, initial encounter  Influenza A  Urinary tract infection without hematuria, site unspecified    ED Discharge Orders    None       Antonietta Breach, PA-C 06/01/17 9417    Tegeler, Gwenyth Allegra, MD 06/01/17 [phone]

## 2017-05-31 NOTE — ED Triage Notes (Signed)
Pt to ED via [facility] from [facility]  Staff reports pt was supine on his bed when he was being changed and rolled off onto floor.  Pt has large hematoma to right forehead.  Pt is on blood thinners.  EMS also reports pt felt warm to touch when asking staff about elevated temp staff reports pt had a temp of 101.7.  No meds given per EMS report

## 2017-05-31 NOTE — Progress Notes (Signed)
PHARMACY NOTE:  ANTIMICROBIAL RENAL DOSAGE ADJUSTMENT  Current antimicrobial regimen includes a mismatch between antimicrobial dosage and estimated renal function.  As per policy approved by the Pharmacy & Therapeutics and Medical Executive Committees, the antimicrobial dosage will be adjusted accordingly.  Current antimicrobial dosage:  tamiflu 75 mg po bid  Indication: Influenza  Renal Function:  Estimated Creatinine Clearance: 44.2 mL/min (by C-G formula based on SCr of 1.2 mg/dL). []      On intermittent HD, scheduled: []      On CRRT    Antimicrobial dosage has been changed to:  tamiflu 30 mg po bid  Harvel Quale, [facility] 05/31/2017 11:49 PM

## 2017-06-01 DIAGNOSIS — G8911 Acute pain due to trauma: Secondary | ICD-10-CM | POA: Diagnosis not present

## 2017-06-01 DIAGNOSIS — R4182 Altered mental status, unspecified: Secondary | ICD-10-CM | POA: Diagnosis not present

## 2017-06-01 LAB — I-STAT CG4 LACTIC ACID, ED: LACTIC ACID, VENOUS: 1.4 mmol/L (ref 0.5–1.9)

## 2017-06-01 MED ORDER — OSELTAMIVIR PHOSPHATE 6 MG/ML PO SUSR: 75.0000 mg | Freq: Two times a day (BID) | ORAL | 0 refills | Status: DC

## 2017-06-01 MED ORDER — ONDANSETRON 4 MG PO TBDP: 4.0000 mg | ORAL_TABLET | Freq: Three times a day (TID) | ORAL | 0 refills | Status: DC | PRN

## 2017-06-01 MED ORDER — CEPHALEXIN 250 MG/5ML PO SUSR: 500.0000 mg | Freq: Two times a day (BID) | ORAL | 0 refills | Status: DC

## 2017-06-01 NOTE — ED Notes (Signed)
Secretary calling [facility] for pt back to [facility].

## 2017-06-01 NOTE — Discharge Instructions (Addendum)
Take Tamiflu and Keflex as prescribed. Give Zofran for nausea or vomiting, as needed. Follow up with your primary care doctor to ensure resolution of symptoms.

## 2017-06-01 NOTE — ED Notes (Signed)
This RN spoke with Mike Fraise, LPN supervisor at [facility], about pt discharge. Prescriptions and dx discussed, follow up, and when/if pt should return to back to [facility]. Seth Bake verbalized understanding of discharge instructions.

## 2017-06-01 NOTE — ED Notes (Signed)
[facility] called for transport.  

## 2017-06-01 NOTE — ED Notes (Signed)
[facility] arrived; taking pt back to [facility].

## 2017-06-01 NOTE — ED Notes (Signed)
At shift change when this RN received pt, Mike Williams, pt previous nurse, stated that EDP had planned on admitting pt and switched pt to discharge. Kim discussed this change with provider; provider discharging pt back to nursing facility.

## 2017-06-02 ENCOUNTER — Encounter (HOSPITAL_COMMUNITY): Payer: Self-pay | Admitting: Emergency Medicine

## 2017-06-02 ENCOUNTER — Emergency Department (HOSPITAL_COMMUNITY): Payer: Medicare Other

## 2017-06-02 ENCOUNTER — Inpatient Hospital Stay (HOSPITAL_COMMUNITY)
Admission: EM | Admit: 2017-06-02 | Discharge: 2017-06-07 | DRG: 865 | Disposition: A | Discharge status: Skilled Nursing Facility | Payer: Medicare Other | Attending: Internal Medicine | Admitting: Internal Medicine

## 2017-06-02 DIAGNOSIS — Z6829 Body mass index (BMI) 29.0-29.9, adult: Secondary | ICD-10-CM

## 2017-06-02 DIAGNOSIS — R41841 Cognitive communication deficit: Secondary | ICD-10-CM | POA: Diagnosis not present

## 2017-06-02 DIAGNOSIS — R0902 Hypoxemia: Secondary | ICD-10-CM | POA: Diagnosis not present

## 2017-06-02 DIAGNOSIS — J690 Pneumonitis due to inhalation of food and vomit: Secondary | ICD-10-CM | POA: Diagnosis present

## 2017-06-02 DIAGNOSIS — F329 Major depressive disorder, single episode, unspecified: Secondary | ICD-10-CM | POA: Diagnosis present

## 2017-06-02 DIAGNOSIS — G92 Toxic encephalopathy: Secondary | ICD-10-CM | POA: Diagnosis present

## 2017-06-02 DIAGNOSIS — D649 Anemia, unspecified: Secondary | ICD-10-CM | POA: Diagnosis present

## 2017-06-02 DIAGNOSIS — G309 Alzheimer's disease, unspecified: Secondary | ICD-10-CM | POA: Diagnosis present

## 2017-06-02 DIAGNOSIS — Z9181 History of falling: Secondary | ICD-10-CM

## 2017-06-02 DIAGNOSIS — R1312 Dysphagia, oropharyngeal phase: Secondary | ICD-10-CM | POA: Diagnosis not present

## 2017-06-02 DIAGNOSIS — J9601 Acute respiratory failure with hypoxia: Secondary | ICD-10-CM | POA: Diagnosis present

## 2017-06-02 DIAGNOSIS — R9431 Abnormal electrocardiogram [ECG] [EKG]: Secondary | ICD-10-CM | POA: Diagnosis not present

## 2017-06-02 DIAGNOSIS — M6281 Muscle weakness (generalized): Secondary | ICD-10-CM | POA: Diagnosis not present

## 2017-06-02 DIAGNOSIS — R2689 Other abnormalities of gait and mobility: Secondary | ICD-10-CM | POA: Diagnosis not present

## 2017-06-02 DIAGNOSIS — R05 Cough: Secondary | ICD-10-CM | POA: Diagnosis not present

## 2017-06-02 DIAGNOSIS — N390 Urinary tract infection, site not specified: Secondary | ICD-10-CM

## 2017-06-02 DIAGNOSIS — G4733 Obstructive sleep apnea (adult) (pediatric): Secondary | ICD-10-CM | POA: Diagnosis present

## 2017-06-02 DIAGNOSIS — Z8249 Family history of ischemic heart disease and other diseases of the circulatory system: Secondary | ICD-10-CM

## 2017-06-02 DIAGNOSIS — Z7902 Long term (current) use of antithrombotics/antiplatelets: Secondary | ICD-10-CM

## 2017-06-02 DIAGNOSIS — Z9049 Acquired absence of other specified parts of digestive tract: Secondary | ICD-10-CM

## 2017-06-02 DIAGNOSIS — R4701 Aphasia: Secondary | ICD-10-CM | POA: Diagnosis present

## 2017-06-02 DIAGNOSIS — J111 Influenza due to unidentified influenza virus with other respiratory manifestations: Secondary | ICD-10-CM

## 2017-06-02 DIAGNOSIS — G929 Unspecified toxic encephalopathy: Secondary | ICD-10-CM | POA: Diagnosis present

## 2017-06-02 DIAGNOSIS — I10 Essential (primary) hypertension: Secondary | ICD-10-CM | POA: Diagnosis present

## 2017-06-02 DIAGNOSIS — B9620 Unspecified Escherichia coli [E. coli] as the cause of diseases classified elsewhere: Secondary | ICD-10-CM | POA: Diagnosis present

## 2017-06-02 DIAGNOSIS — F0280 Dementia in other diseases classified elsewhere without behavioral disturbance: Secondary | ICD-10-CM | POA: Diagnosis present

## 2017-06-02 DIAGNOSIS — G40909 Epilepsy, unspecified, not intractable, without status epilepticus: Secondary | ICD-10-CM | POA: Diagnosis present

## 2017-06-02 DIAGNOSIS — E43 Unspecified severe protein-calorie malnutrition: Secondary | ICD-10-CM

## 2017-06-02 DIAGNOSIS — Z8546 Personal history of malignant neoplasm of prostate: Secondary | ICD-10-CM

## 2017-06-02 DIAGNOSIS — R1310 Dysphagia, unspecified: Secondary | ICD-10-CM | POA: Diagnosis present

## 2017-06-02 DIAGNOSIS — J1081 Influenza due to other identified influenza virus with encephalopathy: Principal | ICD-10-CM | POA: Diagnosis present

## 2017-06-02 DIAGNOSIS — R569 Unspecified convulsions: Secondary | ICD-10-CM

## 2017-06-02 DIAGNOSIS — F0150 Vascular dementia without behavioral disturbance: Secondary | ICD-10-CM

## 2017-06-02 DIAGNOSIS — R0602 Shortness of breath: Secondary | ICD-10-CM | POA: Diagnosis not present

## 2017-06-02 DIAGNOSIS — Z79899 Other long term (current) drug therapy: Secondary | ICD-10-CM

## 2017-06-02 DIAGNOSIS — I352 Nonrheumatic aortic (valve) stenosis with insufficiency: Secondary | ICD-10-CM | POA: Diagnosis present

## 2017-06-02 DIAGNOSIS — Z515 Encounter for palliative care: Secondary | ICD-10-CM | POA: Diagnosis present

## 2017-06-02 DIAGNOSIS — Z8673 Personal history of transient ischemic attack (TIA), and cerebral infarction without residual deficits: Secondary | ICD-10-CM

## 2017-06-02 LAB — COMPREHENSIVE METABOLIC PANEL
ALBUMIN: 3.2 g/dL — AB (ref 3.5–5.0)
ALK PHOS: 97 U/L (ref 38–126)
ALT: 21 U/L (ref 17–63)
AST: 26 U/L (ref 15–41)
Anion gap: 7 (ref 5–15)
BILIRUBIN TOTAL: 0.5 mg/dL (ref 0.3–1.2)
BUN: 20 mg/dL (ref 6–20)
CALCIUM: 8.9 mg/dL (ref 8.9–10.3)
CO2: 28 mmol/L (ref 22–32)
CREATININE: 0.94 mg/dL (ref 0.61–1.24)
Chloride: 104 mmol/L (ref 101–111)
GFR calc Af Amer: >60 mL/min (ref >60)
GFR calc non Af Amer: >60 mL/min (ref >60)
GLUCOSE: 112 mg/dL — AB (ref 65–99)
POTASSIUM: 3.8 mmol/L (ref 3.5–5.1)
Sodium: 139 mmol/L (ref 135–145)
TOTAL PROTEIN: 7.1 g/dL (ref 6.5–8.1)

## 2017-06-02 LAB — CBC WITH DIFFERENTIAL/PLATELET
BASOS ABS: 0 10*3/uL (ref 0.0–0.1)
Basophils Relative: 0 %
Eosinophils Absolute: 0.2 10*3/uL (ref 0.0–0.7)
Eosinophils Relative: 2 %
HEMATOCRIT: 40.2 % (ref 39.0–52.0)
Hemoglobin: 13.4 g/dL (ref 13.0–17.0)
LYMPHS ABS: 1.8 10*3/uL (ref 0.7–4.0)
LYMPHS PCT: 25 %
MCH: 28.9 pg (ref 26.0–34.0)
MCHC: 33.3 g/dL (ref 30.0–36.0)
MCV: 86.6 fL (ref 78.0–100.0)
MONO ABS: 0.8 10*3/uL (ref 0.1–1.0)
Monocytes Relative: 11 %
NEUTROS ABS: 4.4 10*3/uL (ref 1.7–7.7)
Neutrophils Relative %: 62 %
Platelets: 162 10*3/uL (ref 150–400)
RBC: 4.64 MIL/uL (ref 4.22–5.81)
RDW: 14.8 % (ref 11.5–15.5)
WBC: 7.2 10*3/uL (ref 4.0–10.5)

## 2017-06-02 LAB — PROTIME-INR
INR: 1.01
Prothrombin Time: 13.2 seconds (ref 11.4–15.2)

## 2017-06-02 LAB — PHOSPHORUS: PHOSPHORUS: 3.2 mg/dL (ref 2.5–4.6)

## 2017-06-02 LAB — BRAIN NATRIURETIC PEPTIDE: B Natriuretic Peptide: 30.1 pg/mL (ref 0.0–100.0)

## 2017-06-02 LAB — LACTIC ACID, PLASMA: Lactic Acid, Venous: 1.4 mmol/L (ref 0.5–1.9)

## 2017-06-02 LAB — INFLUENZA PANEL BY PCR (TYPE A & B)
INFLAPCR: POSITIVE — AB
INFLBPCR: NEGATIVE

## 2017-06-02 LAB — MAGNESIUM: MAGNESIUM: 1.9 mg/dL (ref 1.7–2.4)

## 2017-06-02 LAB — TROPONIN I: Troponin I: <0.03 ng/mL (ref <0.03)

## 2017-06-02 MED ORDER — CEFTRIAXONE SODIUM 1 G IJ SOLR
1.0000 g | Freq: Once | INTRAMUSCULAR | Status: AC
  Administered 2017-06-02: 1 g via INTRAVENOUS
  Filled 2017-06-02: qty 10

## 2017-06-02 MED ORDER — LACTATED RINGERS IV SOLN
INTRAVENOUS | Status: AC
  Administered 2017-06-02 – 2017-06-03 (×2): via INTRAVENOUS

## 2017-06-02 MED ORDER — LEVETIRACETAM 500 MG PO TABS
750.0000 mg | ORAL_TABLET | Freq: Two times a day (BID) | ORAL | Status: DC
  Filled 2017-06-02 (×4): qty 1

## 2017-06-02 MED ORDER — FELODIPINE ER 10 MG PO TB24
10.0000 mg | ORAL_TABLET | Freq: Every day | ORAL | Status: DC
Start: 2017-06-03 — End: 2017-06-07
  Administered 2017-06-06 – 2017-06-07 (×2): 10 mg via ORAL
  Filled 2017-06-02 (×5): qty 1

## 2017-06-02 MED ORDER — OSELTAMIVIR PHOSPHATE 6 MG/ML PO SUSR: 75.0000 mg | Freq: Two times a day (BID) | ORAL | Status: DC

## 2017-06-02 MED ORDER — CLOPIDOGREL BISULFATE 75 MG PO TABS
75.0000 mg | ORAL_TABLET | Freq: Every day | ORAL | Status: DC
  Administered 2017-06-04 – 2017-06-07 (×4): 75 mg via ORAL
  Filled 2017-06-02 (×5): qty 1

## 2017-06-02 MED ORDER — ESCITALOPRAM OXALATE 10 MG PO TABS
10.0000 mg | ORAL_TABLET | Freq: Every day | ORAL | Status: DC
  Administered 2017-06-04 – 2017-06-07 (×4): 10 mg via ORAL
  Filled 2017-06-02 (×5): qty 1

## 2017-06-02 MED ORDER — OSELTAMIVIR PHOSPHATE 6 MG/ML PO SUSR
75.0000 mg | Freq: Two times a day (BID) | ORAL | Status: AC
  Administered 2017-06-04 – 2017-06-06 (×5): 75 mg via ORAL
  Filled 2017-06-02 (×11): qty 12.5

## 2017-06-02 MED ORDER — DEXTROSE 5 % IV SOLN: 1.0000 g | INTRAVENOUS | Status: DC

## 2017-06-02 MED ORDER — ACETAMINOPHEN 650 MG RE SUPP: 650.0000 mg | Freq: Four times a day (QID) | RECTAL | Status: DC | PRN

## 2017-06-02 MED ORDER — LUBRIDERM SERIOUSLY SENSITIVE EX LOTN
1.0000 "application " | TOPICAL_LOTION | Freq: Two times a day (BID) | CUTANEOUS | Status: DC
  Administered 2017-06-03 – 2017-06-07 (×9): 1 via TOPICAL
  Filled 2017-06-02: qty 473

## 2017-06-02 MED ORDER — ENOXAPARIN SODIUM 40 MG/0.4ML ~~LOC~~ SOLN
40.0000 mg | Freq: Every day | SUBCUTANEOUS | Status: DC
  Administered 2017-06-03 – 2017-06-06 (×5): 40 mg via SUBCUTANEOUS
  Filled 2017-06-02 (×5): qty 0.4

## 2017-06-02 MED ORDER — ACETAMINOPHEN 325 MG PO TABS
650.0000 mg | ORAL_TABLET | Freq: Four times a day (QID) | ORAL | Status: DC | PRN
  Filled 2017-06-02 (×2): qty 2

## 2017-06-02 MED ORDER — IRBESARTAN 300 MG PO TABS
300.0000 mg | ORAL_TABLET | Freq: Every day | ORAL | Status: DC
  Administered 2017-06-04 – 2017-06-07 (×4): 300 mg via ORAL
  Filled 2017-06-02 (×4): qty 1

## 2017-06-02 MED ORDER — DEXTROSE 5 % IV SOLN
500.0000 mg | Freq: Once | INTRAVENOUS | Status: AC
  Administered 2017-06-02: 500 mg via INTRAVENOUS
  Filled 2017-06-02: qty 500

## 2017-06-02 NOTE — ED Notes (Signed)
Attempted IV x 2 unsuccessful.

## 2017-06-02 NOTE — ED Notes (Signed)
ED TO INPATIENT HANDOFF REPORT  Name/Age/Gender Mike Williams 84 y.o. male  Code Status Code Status History    Date Active Date Inactive Code Status Order ID Comments User Context   12/23/2015 18:17 12/26/2015 21:31 Partial Code [id number]  Caren Griffins, MD Inpatient   12/23/2015 14:37 12/23/2015 18:17 Full Code [id number]  Caren Griffins, MD ED   09/23/2014 15:10 09/25/2014 20:00 Full Code [id number]  Oswald Hillock, MD Inpatient   09/27/2013 10:26 09/23/2014 15:10 DNR [id number]  Lisabeth Pick, MD Outpatient   01/11/2012 10:22 01/16/2012 16:46 Full Code [id number]  Nilda Calamity, RN Inpatient   07/02/2011 01:11 07/05/2011 17:48 Full Code [id number]  Valda Lamb, RN Inpatient    Questions for Most Recent Historical Code Status (Order [id number])    Question Answer Comment   In the event of cardiac or respiratory ARREST: Initiate Code Blue, Call Rapid Response Yes    In the event of cardiac or respiratory ARREST: Perform CPR Yes    In the event of cardiac or respiratory ARREST: Perform Intubation/Mechanical Ventilation No    In the event of cardiac or respiratory ARREST: Use NIPPV/BiPAp only if indicated Yes    In the event of cardiac or respiratory ARREST: Administer ACLS medications if indicated Yes    In the event of cardiac or respiratory ARREST: Perform Defibrillation or Cardioversion if indicated Yes       Home/SNF/Other Home  Chief Complaint flu  Level of Care/Admitting Diagnosis ED Disposition    ED Disposition Condition [place]: [facility] [100102]  Level of Care: Med-Surg [16]  Diagnosis: Influenza [163845]  Admitting Physician: Bennie Pierini [[id number]]  Attending Physician: Jonnie Finner, Rodney [[id number]]  PT Class (Do Not Modify): Observation [104]  PT Acc Code (Do Not Modify): Observation [10022]       Medical History Past Medical History:  Diagnosis Date  . Aortic insufficiency 07/04/2011   2/13 Study Conclusions   - Left ventricle: Systolic function was normal. The estimated ejection fraction was in the range of 55% to 60%. - Aortic valve: Moderate regurgitation. - Aorta: The aorta was moderately dilated. Transthoracic echocardiography. M-mode, complete 2D, spectral Doppler, and color Doppler. Height: Height: 170.2cm. Height: 67in. Weight: Weight: 84.1kg. Weight: 185lb. Body mass index: BMI: 29kg/m^2. Body surface area: BSA: 2.71m2. Blood pressure: 175/86. Patient status: Inpatient. Location: Bedside.      . Aortic root dilatation (HCC)    Noted on 09/2010 echo, with mild-mod AI  . CEREBROVASCULAR ACCIDENT, HX OF 02/09/2007  . DEMENTIA 05/12/2007  . Hemorrhoids   . HYPERGLYCEMIA 09/21/2009  . HYPERTENSION 12/29/2006  . Lactose intolerance   . OBSTRUCTIVE SLEEP APNEA 12/29/2006  . PROSTATE CANCER, HX OF 12/29/2006  . Prostatitis   . Risk for falls   . Seizures ([place]     Allergies No Known Allergies  IV Location/Drains/Wounds Patient Lines/Drains/Airways Status   Active Line/Drains/Airways    Name:   Placement date:   Placement time:   Site:   Days:   Peripheral IV 06/02/17 Right Hand   06/02/17    1807    Hand   less than 1   External Urinary Catheter   12/23/15    2000    -   527          Labs/Imaging Results for orders placed or performed during the hospital encounter of 06/02/17 (from the past 48 hour(s))  Influenza panel by PCR (type A & B)  Status: Abnormal   Collection Time: 06/02/17  4:53 PM  Result Value Ref Range   Influenza A By PCR POSITIVE (A) NEGATIVE   Influenza B By PCR NEGATIVE NEGATIVE    Comment: (NOTE) The Xpert Xpress Flu assay is intended as an aid in the diagnosis of  influenza and should not be used as a sole basis for treatment.  This  assay is FDA approved for nasopharyngeal swab specimens only. Nasal  washings and aspirates are unacceptable for Xpert Xpress Flu testing.   Comprehensive metabolic panel     Status: Abnormal   Collection Time: 06/02/17  4:55 PM   Result Value Ref Range   Sodium 139 135 - 145 mmol/L   Potassium 3.8 3.5 - 5.1 mmol/L   Chloride 104 101 - 111 mmol/L   CO2 28 22 - 32 mmol/L   Glucose, Bld 112 (H) 65 - 99 mg/dL   BUN 20 6 - 20 mg/dL   Creatinine, Ser 0.94 0.61 - 1.24 mg/dL   Calcium 8.9 8.9 - 10.3 mg/dL   Total Protein 7.1 6.5 - 8.1 g/dL   Albumin 3.2 (L) 3.5 - 5.0 g/dL   AST 26 15 - 41 U/L   ALT 21 17 - 63 U/L   Alkaline Phosphatase 97 38 - 126 U/L   Total Bilirubin 0.5 0.3 - 1.2 mg/dL   GFR calc non Af Amer >60 >60 mL/min   GFR calc Af Amer >60 >60 mL/min    Comment: (NOTE) The eGFR has been calculated using the CKD EPI equation. This calculation has not been validated in all clinical situations. eGFR's persistently <60 mL/min signify possible Chronic Kidney Disease.    Anion gap 7 5 - 15  Troponin I     Status: None   Collection Time: 06/02/17  4:55 PM  Result Value Ref Range   Troponin I <0.03 <0.03 ng/mL  CBC with Differential     Status: None   Collection Time: 06/02/17  4:55 PM  Result Value Ref Range   WBC 7.2 4.0 - 10.5 K/uL   RBC 4.64 4.22 - 5.81 MIL/uL   Hemoglobin 13.4 13.0 - 17.0 g/dL   HCT 40.2 39.0 - 52.0 %   MCV 86.6 78.0 - 100.0 fL   MCH 28.9 26.0 - 34.0 pg   MCHC 33.3 30.0 - 36.0 g/dL   RDW 14.8 11.5 - 15.5 %   Platelets 162 150 - 400 K/uL   Neutrophils Relative % 62 %   Neutro Abs 4.4 1.7 - 7.7 K/uL   Lymphocytes Relative 25 %   Lymphs Abs 1.8 0.7 - 4.0 K/uL   Monocytes Relative 11 %   Monocytes Absolute 0.8 0.1 - 1.0 K/uL   Eosinophils Relative 2 %   Eosinophils Absolute 0.2 0.0 - 0.7 K/uL   Basophils Relative 0 %   Basophils Absolute 0.0 0.0 - 0.1 K/uL  Protime-INR     Status: None   Collection Time: 06/02/17  4:55 PM  Result Value Ref Range   Prothrombin Time 13.2 11.4 - 15.2 seconds   INR 1.01   Lactic acid, plasma     Status: None   Collection Time: 06/02/17  5:22 PM  Result Value Ref Range   Lactic Acid, Venous 1.4 0.5 - 1.9 mmol/L  Brain natriuretic peptide      Status: None   Collection Time: 06/02/17  5:22 PM  Result Value Ref Range   B Natriuretic Peptide 30.1 0.0 - 100.0 pg/mL   Dg Chest   Port 1 View  Result Date: 06/02/2017 CLINICAL DATA:  Shortness of Breath EXAM: PORTABLE CHEST 1 VIEW COMPARISON:  05/31/2017 FINDINGS: Cardiomegaly. Tortuous, ectatic thoracic aorta. Bibasilar atelectasis. No effusions or acute bony abnormality. IMPRESSION: Cardiomegaly.  Bibasilar atelectasis. Tortuous, ectatic aorta. Electronically Signed   By: Rolm Baptise [name]   On: 06/02/2017 18:12    Pending Labs Unresulted Labs (From admission, onward)   Start     Ordered   06/02/17 1635  Lactic acid, plasma  Now then every 2 hours,   STAT     06/02/17 1635   06/02/17 1635  Culture, blood (routine x 2)  BLOOD CULTURE X 2,   STAT     06/02/17 1635   Signed and Held  Magnesium  Add-on,   R     Signed and Held   Signed and Held  Phosphorus  Add-on,   R     Signed and Held   Signed and Held  Basic metabolic panel  Tomorrow morning,   R     Signed and Held   Signed and Held  CBC  Tomorrow morning,   R     Signed and Held      Vitals/Pain Today's Vitals   06/02/17 2000 06/02/17 2030 06/02/17 2100 06/02/17 2130  BP: 126/79 130/76 134/83 136/78  Pulse: 74 75 73 74  Resp: _0 Temp:      TempSrc:      SpO2: 97% 98% 99% 100%    Isolation Precautions Droplet precaution  Medications Medications  cefTRIAXone (ROCEPHIN) 1 g in dextrose 5 % 50 mL IVPB (0 g Intravenous Stopped 06/02/17 1859)  azithromycin (ZITHROMAX) 500 mg in dextrose 5 % 250 mL IVPB (0 mg Intravenous Stopped 06/02/17 2137)    Mobility non-ambulatory

## 2017-06-02 NOTE — ED Notes (Signed)
Bed: EZ66 Expected date:  Expected time:  Means of arrival:  Comments: Flu/wheezing

## 2017-06-02 NOTE — H&P (Signed)
History and Physical    Mike Williams ZSW:[id number] DOB: 01-Sep-1932 DOA: 06/02/2017  PCP: [name], DO Patient coming from: SNF  I have personally briefly reviewed patient's old medical records in [place]  Chief Complaint: Flu, shortness of breath  HPI: Mike Williams is a 84 y.o. male with medical history significant for vascular dementia with aphasia, seizure disorder, moderate aortic insufficiency, obstructive sleep apnea, hypertension who was admitted after desaturation events today at skilled nursing facility.  Patient was seen in the ED on 1/27 after a fall and at that time was diagnosed with influenza A and a urinary tract infection.  He was started on Tamiflu and Keflex and subsequently discharged on room air.  Per the nursing facility, the patient developed hypoxia today requiring supplemental oxygen.  Patient is limited in his ability to verbalize, but is able to nod to answer questions appropriately.  He endorses shortness of breath, cough, subjective fever.  There was some concern expressed by the skilled nursing facility for an aspiration event that prompted the desaturation.  At the time of my HPI, the patient is comfortable on room air.  He does not have a history of COPD and is a lifelong non-smoker.  Urine culture grew greater than 100,000 CFU per HPF E. coli, sensitivities pending.  CT had obtained on 1/27 was negative for bleed or other acute abnormality and only demonstrated frontal scalp hematoma and chronic changes consistent with his prior history of vascular dementia.  ED Course: In the ED patient had low-grade temp to 99.6, heart rate 76-78, blood pressure 116-121/66 to 65, SPO2 98% on room air.  Labs largely unremarkable with white count of 7.2, Hgb 13.4, platelets 162, normal lecture lites normal renal function, negative troponin, BNP within normal limits and lactate of 1.4.  Chest x-ray showed cardiomegaly and bibasilar atelectasis without evidence for new opacity.   He received ceftriaxone and azithromycin prior to admission to the floor.  Review of Systems: As per HPI otherwise 10 point review of systems negative.   Past Medical History:  Diagnosis Date  . Aortic insufficiency 07/04/2011   2/13 Study Conclusions  - Left ventricle: Systolic function was normal. The estimated ejection fraction was in the range of 55% to 60%. - Aortic valve: Moderate regurgitation. - Aorta: The aorta was moderately dilated. Transthoracic echocardiography. M-mode, complete 2D, spectral Doppler, and color Doppler. Height: Height: 170.2cm. Height: 67in. Weight: Weight: 84.1kg. Weight: 185lb. Body mass index: BMI: 29kg/m^2. Body surface area: BSA: 2.94m^2. Blood pressure: 175/86. Patient status: Inpatient. Location: Bedside.      . Aortic root dilatation (HCC)    Noted on 09/2010 echo, with mild-mod AI  . CEREBROVASCULAR ACCIDENT, HX OF 02/09/2007  . DEMENTIA 05/12/2007  . Hemorrhoids   . HYPERGLYCEMIA 09/21/2009  . HYPERTENSION 12/29/2006  . Lactose intolerance   . OBSTRUCTIVE SLEEP APNEA 12/29/2006  . PROSTATE CANCER, HX OF 12/29/2006  . Prostatitis   . Risk for falls   . Seizures ([place])     Past Surgical History:  Procedure Laterality Date  . CHOLECYSTECTOMY    . GANGLION CYST EXCISION    . PROSTATE SURGERY     cryotherapy     reports that  has never smoked. he has never used smokeless tobacco. He reports that he does not drink alcohol or use drugs.  No Known Allergies  Family History  Problem Relation Age of Onset  . Heart attack Father   . Heart disease Father   . Cancer Sister  Prior to Admission medications   Medication Sig Start Date End Date Taking? Authorizing Provider  acetaminophen (TYLENOL) 500 MG tablet Take 2 tablets (1,000 mg total) by mouth every 6 (six) hours as needed. Patient taking differently: Take 1,000 mg by mouth every 6 (six) hours as needed for mild pain.  05/30/16  Yes Charlesetta Shanks, MD  acetaminophen (TYLENOL) 650 MG suppository  Place 650 mg rectally every 4 (four) hours as needed for fever. Do not exceed 3000 mg in 24 hours   Yes [provider]  betamethasone dipropionate (DIPROLENE) 0.05 % cream Apply 1 application topically daily. Apply to Groin area   Yes [provider]  cephALEXin (KEFLEX) 250 MG/5ML suspension Take 10 mLs (500 mg total) by mouth 2 (two) times daily for 7 days. 06/01/17 06/08/17 Yes Antonietta Breach, PA-C  clopidogrel (PLAVIX) 75 MG tablet take 1 tablet by mouth once daily 11/28/14  Yes Marin Olp, MD  Dextromethorphan-Quinidine (NUEDEXTA) 20-10 MG CAPS Take 1 capsule by mouth 2 (two) times daily.   Yes [provider]  Emollient (EUCERIN) lotion Apply to Dry skin topically two times daily   Yes [provider]  escitalopram (LEXAPRO) 5 MG tablet Take 10 mg by mouth daily. For depression    Yes [provider]  felodipine (PLENDIL) 10 MG 24 hr tablet Take 10 mg by mouth daily.   Yes [provider]  HYDRALAZINE HCL PO Give 100 mg by mouth three times a day for Prophylaxis   Yes [provider]  levETIRAcetam (KEPPRA) 750 MG tablet Take 750 mg by mouth 2 (two) times daily.   Yes [provider]  NUTRITIONAL SUPPLEMENT LIQD House supplement - Med Pass - Give 120 cc by mouth three times daily Nutritional treat - Give 4 ounces by mouth three times daily   Yes [provider]  olmesartan (BENICAR) 40 MG tablet Take 40 mg by mouth daily.   Yes [provider]  ondansetron (ZOFRAN ODT) 4 MG disintegrating tablet Take 1 tablet (4 mg total) by mouth every 8 (eight) hours as needed for nausea or vomiting. 06/01/17  Yes Antonietta Breach, PA-C  oseltamivir (TAMIFLU) 6 MG/ML SUSR suspension Take 12.5 mLs (75 mg total) by mouth 2 (two) times daily. 06/01/17  Yes Antonietta Breach, PA-C  Nutritional Supplements (NUTRITIONAL SUPPLEMENT PO) HSG NAS Diet - HSG Mech Soft Texture, Regular consistency    [provider]    Physical  Exam: Vitals:   06/02/17 2100 06/02/17 2130 06/02/17 2200 06/02/17 2230  BP: 134/83 136/78 126/78 136/78  Pulse: 73 74 72 71  Resp: 16 13 14 13   Temp:      TempSrc:      SpO2: 99% 100% 98% 100%    Constitutional: NAD, calm, comfortable Vitals:   06/02/17 2100 06/02/17 2130 06/02/17 2200 06/02/17 2230  BP: 134/83 136/78 126/78 136/78  Pulse: 73 74 72 71  Resp: 16 13 14 13   Temp:      TempSrc:      SpO2: 99% 100% 98% 100%   Eyes: PERRL, lids and conjunctivae normal ENMT: Mucous membranes are moist. Posterior pharynx clear of any exudate or lesions. Neck: normal, supple, no masses Respiratory: clear to auscultation bilaterally, no wheezing, no crackles. Normal respiratory effort. Cardiovascular: Regular rate and rhythm, no murmurs / rubs / gallops. No extremity edema. 1+ pedal pulses. Abdomen: no tenderness, no masses palpated. Bowel sounds positive.  Musculoskeletal: no clubbing / cyanosis. Spasticity of left hemibody Skin: no rashes, lesions,  ulcers. Neurologic: Unable to assess orientation. Decreased strength with spasticity in left hemibody  Psychiatric: Unable to assess  Labs on Admission: I have personally reviewed following labs and imaging studies  CBC: Recent Labs  Lab 05/31/17 2159 06/02/17 1655  WBC 9.0 7.2  NEUTROABS 7.2 4.4  HGB 14.3 13.4  HCT 43.2 40.2  MCV 87.1 86.6  PLT 124* 902   Basic Metabolic Panel: Recent Labs  Lab 05/31/17 2159 06/02/17 1655  NA 136 139  K 4.0 3.8  CL 98* 104  CO2 27 28  GLUCOSE 177* 112*  BUN 12 20  CREATININE 1.20 0.94  CALCIUM 8.9 8.9   GFR: Estimated Creatinine Clearance: 56.4 mL/min (by C-G formula based on SCr of 0.94 mg/dL). Liver Function Tests: Recent Labs  Lab 05/31/17 2159 06/02/17 1655  AST 36 26  ALT 27 21  ALKPHOS 114 97  BILITOT 0.6 0.5  PROT 7.5 7.1  ALBUMIN 3.4* 3.2*   No results for input(s): LIPASE, AMYLASE in the last 168 hours. No results for input(s): AMMONIA in the last 168  hours. Coagulation Profile: Recent Labs  Lab 05/31/17 2159 06/02/17 1655  INR 1.09 1.01   Cardiac Enzymes: Recent Labs  Lab 06/02/17 1655  TROPONINI <0.03   BNP (last 3 results) No results for input(s): PROBNP in the last 8760 hours. HbA1C: No results for input(s): HGBA1C in the last 72 hours. CBG: No results for input(s): GLUCAP in the last 168 hours. Lipid Profile: No results for input(s): CHOL, HDL, LDLCALC, TRIG, CHOLHDL, LDLDIRECT in the last 72 hours. Thyroid Function Tests: No results for input(s): TSH, T4TOTAL, FREET4, T3FREE, THYROIDAB in the last 72 hours. Anemia Panel: No results for input(s): VITAMINB12, FOLATE, FERRITIN, TIBC, IRON, RETICCTPCT in the last 72 hours. Urine analysis:    Component Value Date/Time   COLORURINE YELLOW 05/31/2017 2306   APPEARANCEUR CLEAR 05/31/2017 2306   LABSPEC 1.026 05/31/2017 2306   PHURINE 5.0 05/31/2017 2306   GLUCOSEU NEGATIVE 05/31/2017 2306   HGBUR NEGATIVE 05/31/2017 2306   BILIRUBINUR NEGATIVE 05/31/2017 2306   BILIRUBINUR n 01/08/2012 1122   KETONESUR NEGATIVE 05/31/2017 2306   PROTEINUR 100 (A) 05/31/2017 2306   UROBILINOGEN 1.0 09/23/2014 1245   NITRITE POSITIVE (A) 05/31/2017 2306   LEUKOCYTESUR MODERATE (A) 05/31/2017 2306    Radiological Exams on Admission: Dg Chest Port 1 View  Result Date: 06/02/2017 CLINICAL DATA:  Shortness of Breath EXAM: PORTABLE CHEST 1 VIEW COMPARISON:  05/31/2017 FINDINGS: Cardiomegaly. Tortuous, ectatic thoracic aorta. Bibasilar atelectasis. No effusions or acute bony abnormality. IMPRESSION: Cardiomegaly.  Bibasilar atelectasis. Tortuous, ectatic aorta. Electronically Signed   By: Rolm Baptise [name]   On: 06/02/2017 18:12   Assessment/Plan Active Problems:   Influenza  Acute hypoxic respiratory failure Influenza A, subsequent encounter Possible desaturation event in the setting of aspiration per facility. No abnormalities noted on CXR. Acute aspiration is unlikely to cause  anaerobic infection. Symptoms also may simply be related to influenza with increased respiratory secretions. Timeline does not fit with post-viral pneumonia. - Continue ceftriaxone, atypical coverage is likely unnecessary - Continue oseltamivir for 5 day course from 1/27 - No compelling reason for anaerobic coverage at this time - SLP eval for likely PFS - Keep NPO given concern for aspiration - IVF while NPO - Maintain SpO2 >90% in setting of acute infection - Incentive spirometry, mucus clearance therapy - Daily CBC - Droplet precautions  UTI - Ceftriaxone as above - Holding Keflex - Follow up urine culture from 1/27  Vascular  dementia Seizure disorder - Continue Plavix - Continue Keppra  HTN - Continue home felodipine, irbesartan - Will hold hydralazine overnight and reassess BP in AM  DVT prophylaxis: Lovenox Code Status: Full Disposition Plan: SNF in 1-2 days Consults called: None Admission status: Obs   Bennie Pierini MD [facility]  If 7PM-7AM, please contact night-coverage [email or web address] Password [facility]  06/02/2017, 10:54 PM

## 2017-06-02 NOTE — ED Provider Notes (Signed)
[facility] DEPT Provider Note   CSN: [id number] Arrival date & time: 06/02/17  1615     History   Chief Complaint No chief complaint on file.   HPI Mike Williams is a 84 y.o. male.  84 year old male with prior history of CVA, dementia, and HTN presents for evaluation of reported low oxygen levels.  Patient was recently evaluated this facility on the 27th after a fall.  He was found to be febrile upon that evaluation.  He was diagnosed both with a UTI and also with influenza.  He has been prescribed Tamiflu.  Staff at his facility noticed that he was wheezing and hypoxic.  Oxygen saturations were noted to be in the 90% range on room air.  Patient does not reportedly have an O2 requirement at baseline.  Patient is nonverbal at baseline - the majority of history was obtained from medical records.   The history is provided by the EMS personnel and medical records.  Wheezing   This is a new problem. Associated symptoms include a fever and cough. The treatment provided no relief. He has had prior hospitalizations. He has had prior ED visits. He has had no prior ICU admissions.    Past Medical History:  Diagnosis Date  . Aortic insufficiency 07/04/2011   2/13 Study Conclusions  - Left ventricle: Systolic function was normal. The estimated ejection fraction was in the range of 55% to 60%. - Aortic valve: Moderate regurgitation. - Aorta: The aorta was moderately dilated. Transthoracic echocardiography. M-mode, complete 2D, spectral Doppler, and color Doppler. Height: Height: 170.2cm. Height: 67in. Weight: Weight: 84.1kg. Weight: 185lb. Body mass index: BMI: 29kg/m^2. Body surface area: BSA: 2.69m^2. Blood pressure: 175/86. Patient status: Inpatient. Location: Bedside.      . Aortic root dilatation (HCC)    Noted on 09/2010 echo, with mild-mod AI  . CEREBROVASCULAR ACCIDENT, HX OF 02/09/2007  . DEMENTIA 05/12/2007  . Hemorrhoids   . HYPERGLYCEMIA 09/21/2009  .  HYPERTENSION 12/29/2006  . Lactose intolerance   . OBSTRUCTIVE SLEEP APNEA 12/29/2006  . PROSTATE CANCER, HX OF 12/29/2006  . Prostatitis   . Risk for falls   . Seizures [facility])     Patient Active Problem List   Diagnosis Date Noted  . CVA (cerebral vascular accident) ([place]) 11/28/2016  . Depression 12/01/2015  . PBA (pseudobulbar affect) 08/08/2015  . Overactive bladder 06/29/2014  . Seizures ([place]) 02/01/2013  . Aortic insufficiency 07/04/2011  . Alzheimer's dementia 05/12/2007  . Obstructive sleep apnea 12/29/2006  . Essential hypertension 12/29/2006  . PROSTATE CANCER, HX OF 12/29/2006    Past Surgical History:  Procedure Laterality Date  . CHOLECYSTECTOMY    . GANGLION CYST EXCISION    . PROSTATE SURGERY     cryotherapy       Home Medications    Prior to Admission medications   Medication Sig Start Date End Date Taking? Authorizing Provider  acetaminophen (TYLENOL) 500 MG tablet Take 2 tablets (1,000 mg total) by mouth every 6 (six) hours as needed. 05/30/16   Charlesetta Shanks, MD  acetaminophen (TYLENOL) 650 MG suppository Place 650 mg rectally every 4 (four) hours as needed for fever. Do not exceed 3000 mg in 24 hours    [provider]  betamethasone dipropionate (DIPROLENE) 0.05 % cream Apply 1 application topically daily. Apply to Groin area    [provider]  cephALEXin (KEFLEX) 250 MG/5ML suspension Take 10 mLs (500 mg total) by mouth 2 (two) times daily for 7 days. 06/01/17  06/08/17  Antonietta Breach, PA-C  clopidogrel (PLAVIX) 75 MG tablet take 1 tablet by mouth once daily 11/28/14   Marin Olp, MD  Dextromethorphan-Quinidine (NUEDEXTA) 20-10 MG CAPS Take 1 capsule by mouth 2 (two) times daily.    [provider]  Emollient (EUCERIN) lotion Apply to Dry skin topically two times daily    [provider]  escitalopram (LEXAPRO) 5 MG tablet Take 5 mg by mouth daily. For depression    [provider]  felodipine (PLENDIL) 10  MG 24 hr tablet Take 10 mg by mouth daily.    [provider]  HYDRALAZINE HCL PO Give 100 mg by mouth three times a day for Prophylaxis    [provider]  levETIRAcetam (KEPPRA) 750 MG tablet Take 750 mg by mouth 2 (two) times daily.    [provider]  NUTRITIONAL SUPPLEMENT LIQD House supplement - Med Pass - Give 120 cc by mouth three times daily Nutritional treat - Give 4 ounces by mouth three times daily    [provider]  Nutritional Supplements (NUTRITIONAL SUPPLEMENT PO) HSG NAS Diet - HSG Mech Soft Texture, Regular consistency    [provider]  olmesartan (BENICAR) 40 MG tablet Take 40 mg by mouth daily.    [provider]  ondansetron (ZOFRAN ODT) 4 MG disintegrating tablet Take 1 tablet (4 mg total) by mouth every 8 (eight) hours as needed for nausea or vomiting. 06/01/17   Antonietta Breach, PA-C  oseltamivir (TAMIFLU) 6 MG/ML SUSR suspension Take 12.5 mLs (75 mg total) by mouth 2 (two) times daily. 06/01/17   Antonietta Breach, PA-C    Family History Family History  Problem Relation Age of Onset  . Heart attack Father   . Heart disease Father   . Cancer Sister     Social History Social History   Tobacco Use  . Smoking status: Never Smoker  . Smokeless tobacco: Never Used  Substance Use Topics  . Alcohol use: No  . Drug use: No     Allergies   Patient has no known allergies.   Review of Systems Review of Systems  Constitutional: Positive for fever.  Respiratory: Positive for cough and wheezing.   All other systems reviewed and are negative.    Physical Exam Updated Vital Signs BP 116/69   Pulse 78   Temp 99.6 F (37.6 C) (Oral)   Resp 14   SpO2 98%   Physical Exam  Constitutional: He appears well-developed and well-nourished. No distress.  HENT:  Head: Normocephalic and atraumatic.  Mouth/Throat: Oropharynx is clear and moist.  Eyes: Conjunctivae and EOM are normal. Pupils are equal, round, and reactive  to light.  Neck: Normal range of motion. Neck supple.  Cardiovascular: Normal rate, regular rhythm and normal heart sounds.  Pulmonary/Chest: Effort normal. No respiratory distress. He has wheezes.  Slightly decreased breath sounds at bases, right greater than left, with trace expiratory wheezes.  Abdominal: Soft. He exhibits no distension. There is no tenderness.  Musculoskeletal: Normal range of motion. He exhibits no edema or deformity.  Neurological: He is alert.  Non verbal at baseline  Alert    Skin: Skin is warm and dry.  Psychiatric: He has a normal mood and affect.  Nursing note and vitals reviewed.    ED Treatments / Results  Labs (all labs ordered are listed, but only abnormal results are displayed) Labs Reviewed  COMPREHENSIVE METABOLIC PANEL - Abnormal; Notable for the following components:  Result Value   Glucose, Bld 112 (*)    Albumin 3.2 (*)    All other components within normal limits  INFLUENZA PANEL BY PCR (TYPE A & B) - Abnormal; Notable for the following components:   Influenza A By PCR POSITIVE (*)    All other components within normal limits  CULTURE, BLOOD (ROUTINE X 2)  CULTURE, BLOOD (ROUTINE X 2)  LACTIC ACID, PLASMA  TROPONIN I  BRAIN NATRIURETIC PEPTIDE  CBC WITH DIFFERENTIAL/PLATELET  PROTIME-INR  LACTIC ACID, PLASMA    EKG  EKG Interpretation  Date/Time:  Tuesday June 02 2017 16:41:39 EST Ventricular Rate:  73 PR Interval:    QRS Duration: 92 QT Interval:  481 QTC Calculation: 531 R Axis:   30 Text Interpretation:  Ectopic atrial rhythm Borderline low voltage, extremity leads Abnormal T, consider ischemia, anterior leads Prolonged QT interval Confirmed by Dene Gentry [phone]) on 06/02/2017 4:48:25 PM       Radiology Ct Head Wo Contrast  Result Date: 05/31/2017 CLINICAL DATA:  Fall out of bed today. Head and neck trauma. Right forehead hematoma. On blood thinners. Initial encounter. EXAM: CT HEAD WITHOUT CONTRAST CT  CERVICAL SPINE WITHOUT CONTRAST TECHNIQUE: Multidetector CT imaging of the head and cervical spine was performed following the standard protocol without intravenous contrast. Multiplanar CT image reconstructions of the cervical spine were also generated. COMPARISON:  Head CT only on 12/23/2015 FINDINGS: CT HEAD FINDINGS Brain: No evidence of acute infarction, hemorrhage, hydrocephalus, extra-axial collection, or mass lesion/mass effect. Moderate diffuse cerebral atrophy and severe chronic small vessel disease again seen. Multiple old lacunar infarcts again seen involving the bilateral basal ganglia and pons. Vascular:  No hyperdense vessel or other acute findings. Skull: No evidence of fracture or other significant bone abnormality. Sinuses/Orbits:  No acute findings. Other: Mild right frontal scalp hematoma. CT CERVICAL SPINE FINDINGS Alignment: Normal. Skull base and vertebrae: No acute fracture. No primary bone lesion or focal pathologic process. Soft tissues and spinal canal: No prevertebral fluid or swelling. No visible canal hematoma. Disc levels: Mild degenerative disc disease is the this is seen from levels of C2-C7. Mild right-sided facet DJD at C7-T1. Upper chest: Negative. Other: None. IMPRESSION: No acute intracranial abnormality. Stable cerebral atrophy, chronic small vessel disease, and old lacunar infarcts. Mild right frontal scalp hematoma.  No evidence of skull fracture. No evidence of acute cervical spine fracture or subluxation. Degenerative spondylosis, as described above. Electronically Signed   By: Earle Gell [name]   On: 05/31/2017 22:31   Ct Cervical Spine Wo Contrast  Result Date: 05/31/2017 CLINICAL DATA:  Fall out of bed today. Head and neck trauma. Right forehead hematoma. On blood thinners. Initial encounter. EXAM: CT HEAD WITHOUT CONTRAST CT CERVICAL SPINE WITHOUT CONTRAST TECHNIQUE: Multidetector CT imaging of the head and cervical spine was performed following the standard protocol  without intravenous contrast. Multiplanar CT image reconstructions of the cervical spine were also generated. COMPARISON:  Head CT only on 12/23/2015 FINDINGS: CT HEAD FINDINGS Brain: No evidence of acute infarction, hemorrhage, hydrocephalus, extra-axial collection, or mass lesion/mass effect. Moderate diffuse cerebral atrophy and severe chronic small vessel disease again seen. Multiple old lacunar infarcts again seen involving the bilateral basal ganglia and pons. Vascular:  No hyperdense vessel or other acute findings. Skull: No evidence of fracture or other significant bone abnormality. Sinuses/Orbits:  No acute findings. Other: Mild right frontal scalp hematoma. CT CERVICAL SPINE FINDINGS Alignment: Normal. Skull base and vertebrae: No acute fracture. No primary bone lesion or  focal pathologic process. Soft tissues and spinal canal: No prevertebral fluid or swelling. No visible canal hematoma. Disc levels: Mild degenerative disc disease is the this is seen from levels of C2-C7. Mild right-sided facet DJD at C7-T1. Upper chest: Negative. Other: None. IMPRESSION: No acute intracranial abnormality. Stable cerebral atrophy, chronic small vessel disease, and old lacunar infarcts. Mild right frontal scalp hematoma.  No evidence of skull fracture. No evidence of acute cervical spine fracture or subluxation. Degenerative spondylosis, as described above. Electronically Signed   By: Earle Gell [name]   On: 05/31/2017 22:31   Dg Chest Port 1 View  Result Date: 06/02/2017 CLINICAL DATA:  Shortness of Breath EXAM: PORTABLE CHEST 1 VIEW COMPARISON:  05/31/2017 FINDINGS: Cardiomegaly. Tortuous, ectatic thoracic aorta. Bibasilar atelectasis. No effusions or acute bony abnormality. IMPRESSION: Cardiomegaly.  Bibasilar atelectasis. Tortuous, ectatic aorta. Electronically Signed   By: Rolm Baptise [name]   On: 06/02/2017 18:12   Dg Chest Port 1 View  Result Date: 05/31/2017 CLINICAL DATA:  Patient with fever. EXAM: PORTABLE  CHEST 1 VIEW COMPARISON:  Chest radiograph 01/12/2012. FINDINGS: Monitoring leads overlie the patient. Stable cardiomegaly. No consolidative pulmonary opacities. No pleural effusion or pneumothorax. IMPRESSION: No acute cardiopulmonary process. Electronically Signed   By: Lovey Newcomer [name]   On: 05/31/2017 21:56    Procedures Procedures (including critical care time)  Medications Ordered in ED Medications  azithromycin (ZITHROMAX) 500 mg in dextrose 5 % 250 mL IVPB (not administered)  cefTRIAXone (ROCEPHIN) 1 g in dextrose 5 % 50 mL IVPB (0 g Intravenous Stopped 06/02/17 1859)     Initial Impression / Assessment and Plan / ED Course  I have reviewed the triage vital signs and the nursing notes.  Pertinent labs & imaging results that were available during my care of the patient were reviewed by me and considered in my medical decision making (see chart for details).     MDM   Screen complete  Patient is presenting for repeat evaluation following recently diagnosed influenza. Patient apparently has developed a mild oxygen requirement in the setting of influenza.  Workup today does not suggest acute bacterial pneumonia.  Patient is also being treated for UTI and we will continue that treatment.  Patient will be admitted for further observation and treatment to the [facility]. Patient was given rocephin and azithromycin upon arrival to cover the possibility of CAP.   Final Clinical Impressions(s) / ED Diagnoses   Final diagnoses:  Influenza  Hypoxemia  Urinary tract infection without hematuria, site unspecified    ED Discharge Orders    None       Valarie Merino, MD 06/02/17 2024

## 2017-06-02 NOTE — ED Triage Notes (Signed)
Per EMS-states he was diagnosed with the flu on Friday-states facility noticed expiratory wheezing-think he might have aspirated-patient is nonverbal-VS WNL-EMS placed pt on [place] at 3L due to a Saturation of 90%-lungs clear

## 2017-06-03 ENCOUNTER — Other Ambulatory Visit: Payer: Self-pay

## 2017-06-03 DIAGNOSIS — Z515 Encounter for palliative care: Secondary | ICD-10-CM | POA: Diagnosis present

## 2017-06-03 DIAGNOSIS — D649 Anemia, unspecified: Secondary | ICD-10-CM | POA: Diagnosis present

## 2017-06-03 DIAGNOSIS — G92 Toxic encephalopathy: Secondary | ICD-10-CM

## 2017-06-03 DIAGNOSIS — R0602 Shortness of breath: Secondary | ICD-10-CM | POA: Diagnosis not present

## 2017-06-03 DIAGNOSIS — N390 Urinary tract infection, site not specified: Secondary | ICD-10-CM

## 2017-06-03 DIAGNOSIS — E43 Unspecified severe protein-calorie malnutrition: Secondary | ICD-10-CM

## 2017-06-03 DIAGNOSIS — Z8546 Personal history of malignant neoplasm of prostate: Secondary | ICD-10-CM | POA: Diagnosis not present

## 2017-06-03 DIAGNOSIS — B9620 Unspecified Escherichia coli [E. coli] as the cause of diseases classified elsewhere: Secondary | ICD-10-CM | POA: Diagnosis present

## 2017-06-03 DIAGNOSIS — R0902 Hypoxemia: Secondary | ICD-10-CM | POA: Diagnosis not present

## 2017-06-03 DIAGNOSIS — R4701 Aphasia: Secondary | ICD-10-CM | POA: Diagnosis present

## 2017-06-03 DIAGNOSIS — I352 Nonrheumatic aortic (valve) stenosis with insufficiency: Secondary | ICD-10-CM | POA: Diagnosis present

## 2017-06-03 DIAGNOSIS — Z9181 History of falling: Secondary | ICD-10-CM | POA: Diagnosis not present

## 2017-06-03 DIAGNOSIS — J9601 Acute respiratory failure with hypoxia: Secondary | ICD-10-CM | POA: Insufficient documentation

## 2017-06-03 DIAGNOSIS — G40909 Epilepsy, unspecified, not intractable, without status epilepticus: Secondary | ICD-10-CM | POA: Diagnosis present

## 2017-06-03 DIAGNOSIS — Z8673 Personal history of transient ischemic attack (TIA), and cerebral infarction without residual deficits: Secondary | ICD-10-CM | POA: Diagnosis not present

## 2017-06-03 DIAGNOSIS — G309 Alzheimer's disease, unspecified: Secondary | ICD-10-CM | POA: Diagnosis present

## 2017-06-03 DIAGNOSIS — F482 Pseudobulbar affect: Secondary | ICD-10-CM | POA: Diagnosis present

## 2017-06-03 DIAGNOSIS — J1081 Influenza due to other identified influenza virus with encephalopathy: Secondary | ICD-10-CM | POA: Diagnosis present

## 2017-06-03 DIAGNOSIS — G929 Unspecified toxic encephalopathy: Secondary | ICD-10-CM | POA: Diagnosis present

## 2017-06-03 DIAGNOSIS — I10 Essential (primary) hypertension: Secondary | ICD-10-CM | POA: Diagnosis present

## 2017-06-03 DIAGNOSIS — R4182 Altered mental status, unspecified: Secondary | ICD-10-CM | POA: Diagnosis not present

## 2017-06-03 DIAGNOSIS — M6281 Muscle weakness (generalized): Secondary | ICD-10-CM | POA: Diagnosis present

## 2017-06-03 DIAGNOSIS — Z6829 Body mass index (BMI) 29.0-29.9, adult: Secondary | ICD-10-CM | POA: Diagnosis not present

## 2017-06-03 DIAGNOSIS — Q231 Congenital insufficiency of aortic valve: Secondary | ICD-10-CM | POA: Diagnosis not present

## 2017-06-03 DIAGNOSIS — J9600 Acute respiratory failure, unspecified whether with hypoxia or hypercapnia: Secondary | ICD-10-CM | POA: Diagnosis not present

## 2017-06-03 DIAGNOSIS — Z7902 Long term (current) use of antithrombotics/antiplatelets: Secondary | ICD-10-CM | POA: Diagnosis not present

## 2017-06-03 DIAGNOSIS — F329 Major depressive disorder, single episode, unspecified: Secondary | ICD-10-CM | POA: Diagnosis present

## 2017-06-03 DIAGNOSIS — R1310 Dysphagia, unspecified: Secondary | ICD-10-CM | POA: Diagnosis present

## 2017-06-03 DIAGNOSIS — R1312 Dysphagia, oropharyngeal phase: Secondary | ICD-10-CM | POA: Diagnosis present

## 2017-06-03 DIAGNOSIS — J111 Influenza due to unidentified influenza virus with other respiratory manifestations: Secondary | ICD-10-CM | POA: Diagnosis not present

## 2017-06-03 DIAGNOSIS — Z9049 Acquired absence of other specified parts of digestive tract: Secondary | ICD-10-CM | POA: Diagnosis not present

## 2017-06-03 DIAGNOSIS — G4733 Obstructive sleep apnea (adult) (pediatric): Secondary | ICD-10-CM | POA: Diagnosis present

## 2017-06-03 DIAGNOSIS — Z79899 Other long term (current) drug therapy: Secondary | ICD-10-CM | POA: Diagnosis not present

## 2017-06-03 DIAGNOSIS — J690 Pneumonitis due to inhalation of food and vomit: Secondary | ICD-10-CM | POA: Diagnosis present

## 2017-06-03 DIAGNOSIS — F0150 Vascular dementia without behavioral disturbance: Secondary | ICD-10-CM | POA: Diagnosis not present

## 2017-06-03 DIAGNOSIS — F0280 Dementia in other diseases classified elsewhere without behavioral disturbance: Secondary | ICD-10-CM | POA: Diagnosis present

## 2017-06-03 LAB — BASIC METABOLIC PANEL
Anion gap: 5 (ref 5–15)
BUN: 20 mg/dL (ref 6–20)
CO2: 29 mmol/L (ref 22–32)
CREATININE: 0.83 mg/dL (ref 0.61–1.24)
Calcium: 8.5 mg/dL — ABNORMAL LOW (ref 8.9–10.3)
Chloride: 107 mmol/L (ref 101–111)
GFR calc non Af Amer: >60 mL/min (ref >60)
Glucose, Bld: 95 mg/dL (ref 65–99)
Potassium: 3.5 mmol/L (ref 3.5–5.1)
SODIUM: 141 mmol/L (ref 135–145)

## 2017-06-03 LAB — CBC
HCT: 36 % — ABNORMAL LOW (ref 39.0–52.0)
Hemoglobin: 11.8 g/dL — ABNORMAL LOW (ref 13.0–17.0)
MCH: 28.2 pg (ref 26.0–34.0)
MCHC: 32.8 g/dL (ref 30.0–36.0)
MCV: 85.9 fL (ref 78.0–100.0)
PLATELETS: 163 10*3/uL (ref 150–400)
RBC: 4.19 MIL/uL — ABNORMAL LOW (ref 4.22–5.81)
RDW: 14.9 % (ref 11.5–15.5)
WBC: 5.6 10*3/uL (ref 4.0–10.5)

## 2017-06-03 LAB — URINE CULTURE: Culture: >=100000 — AB

## 2017-06-03 MED ORDER — HYDRALAZINE HCL 20 MG/ML IJ SOLN
5.0000 mg | Freq: Four times a day (QID) | INTRAMUSCULAR | Status: DC | PRN
  Administered 2017-06-03: 5 mg via INTRAVENOUS
  Filled 2017-06-03 (×2): qty 1

## 2017-06-03 MED ORDER — LACTATED RINGERS IV BOLUS (SEPSIS)
250.0000 mL | Freq: Once | INTRAVENOUS | Status: AC
  Administered 2017-06-03: 250 mL via INTRAVENOUS

## 2017-06-03 MED ORDER — SODIUM CHLORIDE 0.9 % IV SOLN
750.0000 mg | Freq: Two times a day (BID) | INTRAVENOUS | Status: DC
  Administered 2017-06-03 – 2017-06-07 (×8): 750 mg via INTRAVENOUS
  Filled 2017-06-03 (×9): qty 7.5

## 2017-06-03 MED ORDER — DEXTROSE-NACL 5-0.45 % IV SOLN
INTRAVENOUS | Status: AC
  Administered 2017-06-03: 1000 mL via INTRAVENOUS
  Administered 2017-06-03: 12:00:00 via INTRAVENOUS

## 2017-06-03 MED ORDER — LEVETIRACETAM 500 MG PO TABS
750.0000 mg | ORAL_TABLET | Freq: Two times a day (BID) | ORAL | Status: DC
  Filled 2017-06-03 (×2): qty 1

## 2017-06-03 MED ORDER — CEFTRIAXONE SODIUM 1 G IJ SOLR
1.0000 g | INTRAMUSCULAR | Status: DC
  Administered 2017-06-03 – 2017-06-04 (×2): 1 g via INTRAVENOUS
  Filled 2017-06-03 (×2): qty 10

## 2017-06-03 MED ORDER — HYDRALAZINE HCL 50 MG PO TABS
100.0000 mg | ORAL_TABLET | Freq: Three times a day (TID) | ORAL | Status: DC
  Administered 2017-06-04 – 2017-06-07 (×9): 100 mg via ORAL
  Filled 2017-06-03 (×11): qty 2

## 2017-06-03 MED ORDER — SULFAMETHOXAZOLE-TRIMETHOPRIM 800-160 MG PO TABS: 1.0000 | ORAL_TABLET | Freq: Two times a day (BID) | ORAL | Status: DC

## 2017-06-03 NOTE — Progress Notes (Addendum)
[facility] PROGRESS NOTE    Progress Note  [name]  WUX:[id number] DOB: [birth year] DOA: 06/02/2017 PCP: [name], DO     Brief Narrative:   Mike Williams is an 84 y.o. male past medical history of vascular dementia, with aphasia seizure disorder and moderate aortic stenosis who was found to be desaturating at the nursing home was brought to the ED and diagnosed with a influenza and probable UTI  Assessment/Plan:   Influenza A: There is no documentation of hypoxia in the chart. Has remained afebrile with no leukocytosis No acute abnormalities noted on chest x-ray Continue oral Tamiflu.  Acute encephalopathy: Likely due to infectious etiology. Place n.p.o., continue IV fluid hydration. Once encephalopathy resolve we will perform swallowing evaluation.  As he was coughing with drinking water during my examination. Consult hospice and pelvic care.  Essential hypertension Stable, resume home regimen.  Seizures ([place]): No seizures continue current medication.  Acute lower UTI Agree with holding Keflex urine cultures done showed e.coli pansensitive, cont on IV Rocephin. Until SLP done.  Normocytic anemia: Follow up with PCP as an outpatient.  Severe protein caloric malnutrition: SLP.   DVT prophylaxis: lovenox Family Communication:none Disposition Plan/Barrier to D/C: home in 1-2 day Code Status:     Code Status Orders  (From admission, onward)        Start     Ordered   06/02/17 2307  Full code  Continuous     06/02/17 2306    Code Status History    Date Active Date Inactive Code Status Order ID Comments User Context   12/23/2015 18:17 12/26/2015 21:31 Partial Code [id number]  Caren Griffins, MD Inpatient   12/23/2015 14:37 12/23/2015 18:17 Full Code [id number]  Caren Griffins, MD ED   09/23/2014 15:10 09/25/2014 20:00 Full Code [id number]  Oswald Hillock, MD Inpatient   09/27/2013 10:26 09/23/2014 15:10 DNR [id number]  Lisabeth Pick, MD Outpatient     01/11/2012 10:22 01/16/2012 16:46 Full Code [id number]  Nilda Calamity, RN Inpatient   07/02/2011 01:11 07/05/2011 17:48 Full Code [id number]  Valda Lamb, RN Inpatient        IV Access:    Peripheral IV   Procedures and diagnostic studies:   Dg Chest Port 1 View  Result Date: 06/02/2017 CLINICAL DATA:  Shortness of Breath EXAM: PORTABLE CHEST 1 VIEW COMPARISON:  05/31/2017 FINDINGS: Cardiomegaly. Tortuous, ectatic thoracic aorta. Bibasilar atelectasis. No effusions or acute bony abnormality. IMPRESSION: Cardiomegaly.  Bibasilar atelectasis. Tortuous, ectatic aorta. Electronically Signed   By: Rolm Baptise [name]   On: 06/02/2017 18:12     Medical Consultants:    None.  Anti-Infectives:   rocephin  Subjective:    Mike Williams patient is nonverbal but responsive to name.  Objective:    Vitals:   06/02/17 2200 06/02/17 2230 06/02/17 2320 06/03/17 0630  BP: 126/78 136/78 (!) 146/79 (!) 167/73  Pulse: 72 71 71 (!) 58  Resp: 14 13 16 16   Temp:   98.8 F (37.1 C) 98.1 F (36.7 C)  TempSrc:   Oral Oral  SpO2: 98% 100% 99% 100%    Intake/Output Summary (Last 24 hours) at 06/03/2017 0956 Last data filed at 06/03/2017 0618 Gross per 24 hour  Intake 563.75 ml  Output -  Net 563.75 ml   There were no vitals filed for this visit.  Exam: General exam: In no acute distress. Respiratory system: Good air movement and clear to auscultation. Cardiovascular system: S1 & S2 heard, RRR.  Gastrointestinal system: Abdomen is nondistended, soft and nontender.  Extremities: No pedal edema. Skin: No rashes, lesions or ulcers    Data Reviewed:    Labs: Basic Metabolic Panel: Recent Labs  Lab 05/31/17 2159 06/02/17 1655 06/02/17 1815 06/03/17 0421  NA 136 139  --  141  K 4.0 3.8  --  3.5  CL 98* 104  --  107  CO2 27 28  --  29  GLUCOSE 177* 112*  --  95  BUN 12 20  --  20  CREATININE 1.20 0.94  --  0.83  CALCIUM 8.9 8.9  --  8.5*  MG  --   --  1.9  --   PHOS   --   --  3.2  --    GFR Estimated Creatinine Clearance: 63.9 mL/min (by C-G formula based on SCr of 0.83 mg/dL). Liver Function Tests: Recent Labs  Lab 05/31/17 2159 06/02/17 1655  AST 36 26  ALT 27 21  ALKPHOS 114 97  BILITOT 0.6 0.5  PROT 7.5 7.1  ALBUMIN 3.4* 3.2*   No results for input(s): LIPASE, AMYLASE in the last 168 hours. No results for input(s): AMMONIA in the last 168 hours. Coagulation profile Recent Labs  Lab 05/31/17 2159 06/02/17 1655  INR 1.09 1.01    CBC: Recent Labs  Lab 05/31/17 2159 06/02/17 1655 06/03/17 0421  WBC 9.0 7.2 5.6  NEUTROABS 7.2 4.4  --   HGB 14.3 13.4 11.8*  HCT 43.2 40.2 36.0*  MCV 87.1 86.6 85.9  PLT 124* 162 163   Cardiac Enzymes: Recent Labs  Lab 06/02/17 1655  TROPONINI <0.03   BNP (last 3 results) No results for input(s): PROBNP in the last 8760 hours. CBG: No results for input(s): GLUCAP in the last 168 hours. D-Dimer: No results for input(s): DDIMER in the last 72 hours. Hgb A1c: No results for input(s): HGBA1C in the last 72 hours. Lipid Profile: No results for input(s): CHOL, HDL, LDLCALC, TRIG, CHOLHDL, LDLDIRECT in the last 72 hours. Thyroid function studies: No results for input(s): TSH, T4TOTAL, T3FREE, THYROIDAB in the last 72 hours.  Invalid input(s): FREET3 Anemia work up: No results for input(s): VITAMINB12, FOLATE, FERRITIN, TIBC, IRON, RETICCTPCT in the last 72 hours. Sepsis Labs: Recent Labs  Lab 05/31/17 2159 05/31/17 2210 06/01/17 0117 06/02/17 1655 06/02/17 1722 06/03/17 0421  WBC 9.0  --   --  7.2  --  5.6  LATICACIDVEN  --  2.36* 1.40  --  1.4  --    Microbiology Recent Results (from the past 240 hour(s))  Blood Culture (routine x 2)     Status: None (Preliminary result)   Collection Time: 05/31/17 10:05 PM  Result Value Ref Range Status   Specimen Description BLOOD LEFT FOREARM  Final   Special Requests   Final    BOTTLES DRAWN AEROBIC AND ANAEROBIC Blood Culture adequate  volume   Culture NO GROWTH 2 DAYS  Final   Report Status PENDING  Incomplete  Blood Culture (routine x 2)     Status: None (Preliminary result)   Collection Time: 05/31/17 10:06 PM  Result Value Ref Range Status   Specimen Description BLOOD RIGHT ANTECUBITAL  Final   Special Requests   Final    BOTTLES DRAWN AEROBIC AND ANAEROBIC Blood Culture adequate volume   Culture NO GROWTH 2 DAYS  Final   Report Status PENDING  Incomplete  Urine culture     Status: Abnormal   Collection Time: 05/31/17 11:06 PM  Result Value Ref Range Status   Specimen Description URINE, CLEAN CATCH  Final   Special Requests NONE  Final   Culture >=100,000 COLONIES/mL ESCHERICHIA COLI (A)  Final   Report Status 06/03/2017 FINAL  Final   Organism ID, Bacteria ESCHERICHIA COLI (A)  Final      Susceptibility   Escherichia coli - MIC*    AMPICILLIN 4 SENSITIVE Sensitive     CEFAZOLIN <=4 SENSITIVE Sensitive     CEFTRIAXONE <=1 SENSITIVE Sensitive     CIPROFLOXACIN <=0.25 SENSITIVE Sensitive     GENTAMICIN <=1 SENSITIVE Sensitive     IMIPENEM <=0.25 SENSITIVE Sensitive     NITROFURANTOIN <=16 SENSITIVE Sensitive     TRIMETH/SULFA <=20 SENSITIVE Sensitive     AMPICILLIN/SULBACTAM 8 SENSITIVE Sensitive     PIP/TAZO <=4 SENSITIVE Sensitive     Extended ESBL NEGATIVE Sensitive     * >=100,000 COLONIES/mL ESCHERICHIA COLI     Medications:   . clopidogrel  75 mg Oral Daily  . enoxaparin (LOVENOX) injection  40 mg Subcutaneous QHS  . escitalopram  10 mg Oral Daily  . felodipine  10 mg Oral Daily  . irbesartan  300 mg Oral Daily  . levETIRAcetam  750 mg Oral BID  . lubriderm seriously sensitive  1 application Topical BID  . oseltamivir  75 mg Oral BID   Continuous Infusions: . cefTRIAXone (ROCEPHIN)  IV    . lactated ringers 75 mL/hr at 06/03/17 0844     LOS: 0 days   Charlynne Cousins  [facility] Pager [phone]  *Please refer to [email or web address], password [email or web address] to get updated schedule on who will  round on this patient, as hospitalists switch teams weekly. If 7PM-7AM, please contact night-coverage at [email or web address], password [email or web address] for any overnight needs.  06/03/2017, 9:56 AM

## 2017-06-03 NOTE — Clinical Social Work Note (Signed)
Clinical Social Work Assessment  Patient Details  Name: Mike Williams MRN: [id number] Date of Birth: [birth year]  Date of referral:  06/03/17               Reason for consult:  Facility Placement                Permission sought to share information with:  Family Supports, Chartered certified accountant granted to share information::  Yes, Verbal Permission Granted  Name::        Agency::  [facility]  Relationship::  Spouse   Contact Information:     Housing/Transportation Living arrangements for the past 2 months:  [place] of Information:  Spouse, Facility Patient Interpreter Needed:  None Criminal Activity/Legal Involvement Pertinent to Current Situation/Hospitalization:  No - Comment as needed Significant Relationships:    Lives with:  Facility Resident Do you feel safe going back to the place where you live?  Yes Need for family participation in patient care:  Yes (Comment)  Care giving concerns:   Patient admitted for Flu and Shortness of breath.   Social Worker assessment / plan: CSW discussed discharge planning with patient spouse. She reports the patient is a LTC resident at [facility]. The patient will return at discharge. The patient needs maximum assistance with bathing and dressing.   Plan: Assist with discharge back to SNF.   Employment status:  Retired Forensic scientist:  Medicare PT Recommendations:  Not assessed at this time [place] / Referral to community resources:  [place]  Patient/Family's Response to care:  Agreeable and Responding well care.   Patient/Family's Understanding of and Emotional Response to Diagnosis, Current Treatment, and Prognosis:  Patient spouse knowledgeable of patient care and needs.   Emotional Assessment Appearance:  Appears stated age Attitude/Demeanor/Rapport:    Affect (typically observed):  Accepting Orientation:  (Responds to voice) Alcohol / Substance  use:  Not Applicable Psych involvement (Current and /or in the community):  No (Comment)  Discharge Needs  Concerns to be addressed:  Discharge Planning Concerns Readmission within the last 30 days:  No Current discharge risk:  None Barriers to Discharge:  No Barriers Identified   Mike Hopping, LCSW 06/03/2017, 12:29 PM

## 2017-06-03 NOTE — NC FL2 (Addendum)
[place] LEVEL OF CARE SCREENING TOOL     IDENTIFICATION  Patient Name: Mike Williams Birthdate: 1932/05/13 Sex: male Admission Date (Current Location): 06/02/2017  [facility] and [place] Number:  Herbalist and Address:  [facility],  [place], [place]      Provider Number: [id number]  Attending Physician Name and Address:  Charlynne Cousins, MD  Relative Name and Phone Number:       Current Level of Care: Hospital Recommended Level of Care: [place] Prior Approval Number:    Date Approved/Denied:   PASRR Number:    Discharge Plan: SNF    Current Diagnoses: Patient Active Problem List   Diagnosis Date Noted  . Acute lower UTI 06/03/2017  . Toxic encephalopathy 06/03/2017  . Normocytic anemia 06/03/2017  . Severe protein-calorie malnutrition ([place]) 06/03/2017  . Influenza 06/02/2017  . CVA (cerebral vascular accident) ([place]) 11/28/2016  . Depression 12/01/2015  . PBA (pseudobulbar affect) 08/08/2015  . Overactive bladder 06/29/2014  . Seizures ([place]) 02/01/2013  . Aortic insufficiency 07/04/2011  . Alzheimer's dementia 05/12/2007  . Obstructive sleep apnea 12/29/2006  . Essential hypertension 12/29/2006  . PROSTATE CANCER, HX OF 12/29/2006    Orientation RESPIRATION BLADDER Height & Weight     (Responds to Voice)  Normal Continent Weight:   Height:     BEHAVIORAL SYMPTOMS/MOOD NEUROLOGICAL BOWEL NUTRITION STATUS      Continent Diet(Regular )  AMBULATORY STATUS COMMUNICATION OF NEEDS Skin     Verbally Normal                       Personal Care Assistance Level of Assistance  Bathing, Feeding, Dressing Bathing Assistance: Maximum assistance Feeding assistance: Maximum assistance Dressing Assistance: Maximum assistance     Functional Limitations Info  Sight, Hearing, Speech Sight Info: Adequate Hearing Info: Adequate Speech Info: Adequate    SPECIAL CARE FACTORS FREQUENCY                        Contractures Contractures Info: Not present    Additional Factors Info  Code Status, Allergies, Psychotropic  isolation Code Status Info: Fullcode Allergies Info: Allergies: No Known Allergies Psychotropic Info: Keppra ,Lexapro    Droplet precautions-MRSA     Current Medications (06/03/2017):  This is the current hospital active medication list Current Facility-Administered Medications  Medication Dose Route Frequency Provider Last Rate Last Dose  . acetaminophen (TYLENOL) tablet 650 mg  650 mg Oral Q6H PRN Bennie Pierini, MD       Or  . acetaminophen (TYLENOL) suppository 650 mg  650 mg Rectal Q6H PRN Bennie Pierini, MD      . cefTRIAXone (ROCEPHIN) 1 g in dextrose 5 % 50 mL IVPB  1 g Intravenous Q24H Charlynne Cousins, MD      . clopidogrel (PLAVIX) tablet 75 mg  75 mg Oral Daily Bennie Pierini, MD      . dextrose 5 %-0.45 % sodium chloride infusion   Intravenous Continuous Charlynne Cousins, MD      . enoxaparin (LOVENOX) injection 40 mg  40 mg Subcutaneous QHS Bennie Pierini, MD   40 mg at 06/03/17 0015  . escitalopram (LEXAPRO) tablet 10 mg  10 mg Oral Daily Bennie Pierini, MD      . felodipine (PLENDIL) 24 hr tablet 10 mg  10 mg Oral Daily Bennie Pierini, MD      .  hydrALAZINE (APRESOLINE) tablet 100 mg  100 mg Oral Q8H Charlynne Cousins, MD      . irbesartan Levy Sjogren) tablet 300 mg  300 mg Oral Daily Bennie Pierini, MD      . levETIRAcetam (KEPPRA) tablet 750 mg  750 mg Oral BID Charlynne Cousins, MD      . lubriderm seriously sensitive lotion 1 application  1 application Topical BID Bennie Pierini, MD   1 application at 65/46/50 0018  . oseltamivir (TAMIFLU) 6 MG/ML suspension 75 mg  75 mg Oral BID Bennie Pierini, MD         Discharge Medications: Please see discharge summary for a list of discharge medications.  Relevant Imaging Results:  Relevant Lab Results:   Additional Information SS#:  [id number]  Lia Hopping, LCSW

## 2017-06-03 NOTE — Evaluation (Signed)
SLP Cancellation Note  Patient Details Name: Mike Williams MRN: [id number] DOB: Jul 11, 1932   Cancelled treatment:       Reason Eval/Treat Not Completed: Other (comment);Fatigue/lethargy limiting ability to participate  Pt did not awaken adequately for po intake or eval.  Will continue efforts.    Macario Golds 06/03/2017, 9:45 AM  Luanna Salk, [place] [facility] SLP [phone]

## 2017-06-03 NOTE — Progress Notes (Signed)
Patient's BP is 167/73, doctor on call was paged.

## 2017-06-03 NOTE — Progress Notes (Signed)
Tried to give patient his medication but he would not open his mouth.

## 2017-06-03 NOTE — Progress Notes (Signed)
Patient hasn't voided in the last five hours since being admitted. He was bladder scanned and has 225 mls of urine. On call doctor was paged.

## 2017-06-04 ENCOUNTER — Telehealth: Payer: Self-pay | Admitting: *Deleted

## 2017-06-04 ENCOUNTER — Other Ambulatory Visit: Payer: Self-pay

## 2017-06-04 DIAGNOSIS — E43 Unspecified severe protein-calorie malnutrition: Secondary | ICD-10-CM

## 2017-06-04 DIAGNOSIS — F0150 Vascular dementia without behavioral disturbance: Secondary | ICD-10-CM

## 2017-06-04 DIAGNOSIS — Z515 Encounter for palliative care: Secondary | ICD-10-CM

## 2017-06-04 LAB — BLOOD CULTURE ID PANEL (REFLEXED)
Acinetobacter baumannii: NOT DETECTED
CANDIDA TROPICALIS: NOT DETECTED
Candida albicans: NOT DETECTED
Candida glabrata: NOT DETECTED
Candida krusei: NOT DETECTED
Candida parapsilosis: NOT DETECTED
ENTEROBACTER CLOACAE COMPLEX: NOT DETECTED
ENTEROCOCCUS SPECIES: NOT DETECTED
Enterobacteriaceae species: NOT DETECTED
Escherichia coli: NOT DETECTED
HAEMOPHILUS INFLUENZAE: NOT DETECTED
KLEBSIELLA PNEUMONIAE: NOT DETECTED
Klebsiella oxytoca: NOT DETECTED
LISTERIA MONOCYTOGENES: NOT DETECTED
METHICILLIN RESISTANCE: DETECTED — AB
NEISSERIA MENINGITIDIS: NOT DETECTED
PROTEUS SPECIES: NOT DETECTED
Pseudomonas aeruginosa: NOT DETECTED
SERRATIA MARCESCENS: NOT DETECTED
STAPHYLOCOCCUS AUREUS BCID: NOT DETECTED
STAPHYLOCOCCUS SPECIES: DETECTED — AB
STREPTOCOCCUS AGALACTIAE: NOT DETECTED
STREPTOCOCCUS SPECIES: NOT DETECTED
Streptococcus pneumoniae: NOT DETECTED
Streptococcus pyogenes: NOT DETECTED

## 2017-06-04 MED ORDER — VANCOMYCIN HCL 10 G IV SOLR: 1250.0000 mg | Freq: Two times a day (BID) | INTRAVENOUS | Status: DC

## 2017-06-04 MED ORDER — VANCOMYCIN HCL 10 G IV SOLR
1250.0000 mg | Freq: Once | INTRAVENOUS | Status: DC
  Filled 2017-06-04: qty 1250

## 2017-06-04 NOTE — Progress Notes (Signed)
BSE completed, full report to follow.  Pt with cognitive based dysphagia and concern for possible aspiration with thin/nectar via straw.  No indication of airway compromise with thin, nectar via cup, applesauce or graham cracker.  Pt with rotary mastication pattern.  Mild delay in swallow presumed and multiple swallows with liquids likely indicative of piecemealing. Recommend dys3/thin with strict precautions.  Son Albertina Parr present and educated to recommendations.  Son also advised pt "doesn't swallow well" prior to admit and was on soft/thin diet. Will follow up to assure tolerance, indication of instrumental evaluation.   Luanna Salk, [place] [facility] SLP [phone]

## 2017-06-04 NOTE — Progress Notes (Signed)
[facility] PROGRESS NOTE    Progress Note  Mike Williams  XBM:[id number] DOB: 03-Apr-1933 DOA: 06/02/2017 PCP: [name], DO     Brief Narrative:   Mike Williams is an 84 y.o. male past medical history of vascular dementia, with aphasia seizure disorder and moderate aortic stenosis who was found to be desaturating at the nursing home was brought to the ED and diagnosed with a influenza and probable UTI  Assessment/Plan:   Influenza A: Continue oral Tamiflu.  Bacteremia: Blood ID reflex panel was positive for MRSA we will add IV vancomycin. Repeat blood cultures in the morning.  Acute encephalopathy: Likely due to infectious etiology. Keep n.p.o. swallowing evaluation pending. Family agreed to meet with palliative care.  Essential hypertension Stable, resume home regimen.  Seizures ([place]): No seizures continue current medication.  Acute lower UTI Continue IV Rocephin, culture data pending.  Normocytic anemia: Follow up with PCP as an outpatient.  Severe protein caloric malnutrition: SLP.  Alzheimer's dementia: No changes made to his medication. He relates that his baseline he is not very talkative and keeps to himself but he is able to respond to name.  DVT prophylaxis: lovenox Family Communication:none Disposition Plan/Barrier to D/C: home in 3-4 days Code Status:     Code Status Orders  (From admission, onward)        Start     Ordered   06/02/17 2307  Full code  Continuous     06/02/17 2306    Code Status History    Date Active Date Inactive Code Status Order ID Comments User Context   12/23/2015 18:17 12/26/2015 21:31 Partial Code [id number]  Caren Griffins, MD Inpatient   12/23/2015 14:37 12/23/2015 18:17 Full Code [id number]  Caren Griffins, MD ED   09/23/2014 15:10 09/25/2014 20:00 Full Code [id number]  Oswald Hillock, MD Inpatient   09/27/2013 10:26 09/23/2014 15:10 DNR [id number]  Lisabeth Pick, MD Outpatient   01/11/2012 10:22 01/16/2012 16:46  Full Code [id number]  Nilda Calamity, RN Inpatient   07/02/2011 01:11 07/05/2011 17:48 Full Code [id number]  Valda Lamb, RN Inpatient        IV Access:    Peripheral IV   Procedures and diagnostic studies:   Dg Chest Port 1 View  Result Date: 06/02/2017 CLINICAL DATA:  Shortness of Breath EXAM: PORTABLE CHEST 1 VIEW COMPARISON:  05/31/2017 FINDINGS: Cardiomegaly. Tortuous, ectatic thoracic aorta. Bibasilar atelectasis. No effusions or acute bony abnormality. IMPRESSION: Cardiomegaly.  Bibasilar atelectasis. Tortuous, ectatic aorta. Electronically Signed   By: Rolm Baptise [name]   On: 06/02/2017 18:12     Medical Consultants:    None.  Anti-Infectives:   rocephin  Subjective:    Mike Williams patient is nonverbal but responsive to name.  Objective:    Vitals:   06/03/17 2131 06/04/17 0033 06/04/17 0531 06/04/17 0700  BP: (!) 179/93 (!) 150/70 (!) 154/82   Pulse: (!) 54 65 62   Resp: 18 18 18    Temp: 99.3 F (37.4 C) 98.3 F (36.8 C) 98.6 F (37 C)   TempSrc: Oral Axillary Oral   SpO2: 98%  100%   Height:    6' (1.829 m)    Intake/Output Summary (Last 24 hours) at 06/04/2017 0914 Last data filed at 06/04/2017 0600 Gross per 24 hour  Intake 1935 ml  Output 850 ml  Net 1085 ml   There were no vitals filed for this visit.  Exam: General exam: In no acute distress. Respiratory system: Good air movement  and clear to auscultation. Cardiovascular system: S1 & S2 heard, RRR.  Gastrointestinal system: Abdomen is nondistended, soft and nontender.  Extremities: No pedal edema. Skin: No rashes, lesions or ulcers    Data Reviewed:    Labs: Basic Metabolic Panel: Recent Labs  Lab 05/31/17 2159 06/02/17 1655 06/02/17 1815 06/03/17 0421  NA 136 139  --  141  K 4.0 3.8  --  3.5  CL 98* 104  --  107  CO2 27 28  --  29  GLUCOSE 177* 112*  --  95  BUN 12 20  --  20  CREATININE 1.20 0.94  --  0.83  CALCIUM 8.9 8.9  --  8.5*  MG  --   --  1.9  --     PHOS  --   --  3.2  --    GFR Estimated Creatinine Clearance: 63.9 mL/min (by C-G formula based on SCr of 0.83 mg/dL). Liver Function Tests: Recent Labs  Lab 05/31/17 2159 06/02/17 1655  AST 36 26  ALT 27 21  ALKPHOS 114 97  BILITOT 0.6 0.5  PROT 7.5 7.1  ALBUMIN 3.4* 3.2*   No results for input(s): LIPASE, AMYLASE in the last 168 hours. No results for input(s): AMMONIA in the last 168 hours. Coagulation profile Recent Labs  Lab 05/31/17 2159 06/02/17 1655  INR 1.09 1.01    CBC: Recent Labs  Lab 05/31/17 2159 06/02/17 1655 06/03/17 0421  WBC 9.0 7.2 5.6  NEUTROABS 7.2 4.4  --   HGB 14.3 13.4 11.8*  HCT 43.2 40.2 36.0*  MCV 87.1 86.6 85.9  PLT 124* 162 163   Cardiac Enzymes: Recent Labs  Lab 06/02/17 1655  TROPONINI <0.03   BNP (last 3 results) No results for input(s): PROBNP in the last 8760 hours. CBG: No results for input(s): GLUCAP in the last 168 hours. D-Dimer: No results for input(s): DDIMER in the last 72 hours. Hgb A1c: No results for input(s): HGBA1C in the last 72 hours. Lipid Profile: No results for input(s): CHOL, HDL, LDLCALC, TRIG, CHOLHDL, LDLDIRECT in the last 72 hours. Thyroid function studies: No results for input(s): TSH, T4TOTAL, T3FREE, THYROIDAB in the last 72 hours.  Invalid input(s): FREET3 Anemia work up: No results for input(s): VITAMINB12, FOLATE, FERRITIN, TIBC, IRON, RETICCTPCT in the last 72 hours. Sepsis Labs: Recent Labs  Lab 05/31/17 2159 05/31/17 2210 06/01/17 0117 06/02/17 1655 06/02/17 1722 06/03/17 0421  WBC 9.0  --   --  7.2  --  5.6  LATICACIDVEN  --  2.36* 1.40  --  1.4  --    Microbiology Recent Results (from the past 240 hour(s))  Blood Culture (routine x 2)     Status: None (Preliminary result)   Collection Time: 05/31/17 10:05 PM  Result Value Ref Range Status   Specimen Description BLOOD LEFT FOREARM  Final   Special Requests   Final    BOTTLES DRAWN AEROBIC AND ANAEROBIC Blood Culture  adequate volume   Culture NO GROWTH 3 DAYS  Final   Report Status PENDING  Incomplete  Blood Culture (routine x 2)     Status: None (Preliminary result)   Collection Time: 05/31/17 10:06 PM  Result Value Ref Range Status   Specimen Description BLOOD RIGHT ANTECUBITAL  Final   Special Requests   Final    BOTTLES DRAWN AEROBIC AND ANAEROBIC Blood Culture adequate volume   Culture NO GROWTH 3 DAYS  Final   Report Status PENDING  Incomplete  Urine culture  Status: Abnormal   Collection Time: 05/31/17 11:06 PM  Result Value Ref Range Status   Specimen Description URINE, CLEAN CATCH  Final   Special Requests NONE  Final   Culture >=100,000 COLONIES/mL ESCHERICHIA COLI (A)  Final   Report Status 06/03/2017 FINAL  Final   Organism ID, Bacteria ESCHERICHIA COLI (A)  Final      Susceptibility   Escherichia coli - MIC*    AMPICILLIN 4 SENSITIVE Sensitive     CEFAZOLIN <=4 SENSITIVE Sensitive     CEFTRIAXONE <=1 SENSITIVE Sensitive     CIPROFLOXACIN <=0.25 SENSITIVE Sensitive     GENTAMICIN <=1 SENSITIVE Sensitive     IMIPENEM <=0.25 SENSITIVE Sensitive     NITROFURANTOIN <=16 SENSITIVE Sensitive     TRIMETH/SULFA <=20 SENSITIVE Sensitive     AMPICILLIN/SULBACTAM 8 SENSITIVE Sensitive     PIP/TAZO <=4 SENSITIVE Sensitive     Extended ESBL NEGATIVE Sensitive     * >=100,000 COLONIES/mL ESCHERICHIA COLI  Culture, blood (routine x 2)     Status: None (Preliminary result)   Collection Time: 06/02/17  5:22 PM  Result Value Ref Range Status   Specimen Description BLOOD BLOOD LEFT HAND  Final   Special Requests   Final    BOTTLES DRAWN AEROBIC AND ANAEROBIC Blood Culture adequate volume   Culture  Setup Time   Final    GRAM POSITIVE COCCI Organism ID to follow IN BOTH AEROBIC AND ANAEROBIC BOTTLES CRITICAL RESULT CALLED TO, READ BACK BY AND VERIFIED WITH: Lavell Luster [facility] 06/04/17 0308 JDW Performed at [facility], [address], [place], [place] [address]    Culture GRAM  POSITIVE COCCI  Final   Report Status PENDING  Incomplete  Blood Culture ID Panel (Reflexed)     Status: Abnormal   Collection Time: 06/02/17  5:22 PM  Result Value Ref Range Status   Enterococcus species NOT DETECTED NOT DETECTED Final   Listeria monocytogenes NOT DETECTED NOT DETECTED Final   Staphylococcus species DETECTED (A) NOT DETECTED Final    Comment: Methicillin (oxacillin) resistant coagulase negative staphylococcus. Possible blood culture contaminant (unless isolated from more than one blood culture draw or clinical case suggests pathogenicity). No antibiotic treatment is indicated for blood  culture contaminants. CRITICAL RESULT CALLED TO, READ BACK BY AND VERIFIED WITH: J GRIMSLEY PHARMD 06/04/17 0308 JDW    Staphylococcus aureus NOT DETECTED NOT DETECTED Final   Methicillin resistance DETECTED (A) NOT DETECTED Final    Comment: CRITICAL RESULT CALLED TO, READ BACK BY AND VERIFIED WITH: J GRIMSLEY PHARMD 06/04/17 0308 JDW    Streptococcus species NOT DETECTED NOT DETECTED Final   Streptococcus agalactiae NOT DETECTED NOT DETECTED Final   Streptococcus pneumoniae NOT DETECTED NOT DETECTED Final   Streptococcus pyogenes NOT DETECTED NOT DETECTED Final   Acinetobacter baumannii NOT DETECTED NOT DETECTED Final   Enterobacteriaceae species NOT DETECTED NOT DETECTED Final   Enterobacter cloacae complex NOT DETECTED NOT DETECTED Final   Escherichia coli NOT DETECTED NOT DETECTED Final   Klebsiella oxytoca NOT DETECTED NOT DETECTED Final   Klebsiella pneumoniae NOT DETECTED NOT DETECTED Final   Proteus species NOT DETECTED NOT DETECTED Final   Serratia marcescens NOT DETECTED NOT DETECTED Final   Haemophilus influenzae NOT DETECTED NOT DETECTED Final   Neisseria meningitidis NOT DETECTED NOT DETECTED Final   Pseudomonas aeruginosa NOT DETECTED NOT DETECTED Final   Candida albicans NOT DETECTED NOT DETECTED Final   Candida glabrata NOT DETECTED NOT DETECTED Final   Candida  krusei NOT DETECTED NOT DETECTED Final   Candida parapsilosis NOT DETECTED NOT DETECTED Final   Candida tropicalis NOT DETECTED NOT DETECTED Final    Comment: Performed at [facility], [place] [address] [place], [place] [address]  Culture, blood (routine x 2)     Status: None (Preliminary result)   Collection Time: 06/02/17  5:23 PM  Result Value Ref Range Status   Specimen Description BLOOD BLOOD RIGHT HAND  Final   Special Requests   Final    BOTTLES DRAWN AEROBIC AND ANAEROBIC Blood Culture adequate volume   Culture   Final    NO GROWTH < 24 HOURS Performed at [facility], [place] [address] [place], [place] [address]    Report Status PENDING  Incomplete     Medications:   . clopidogrel  75 mg Oral Daily  . enoxaparin (LOVENOX) injection  40 mg Subcutaneous QHS  . escitalopram  10 mg Oral Daily  . felodipine  10 mg Oral Daily  . hydrALAZINE  100 mg Oral Q8H  . irbesartan  300 mg Oral Daily  . lubriderm seriously sensitive  1 application Topical BID  . oseltamivir  75 mg Oral BID   Continuous Infusions: . cefTRIAXone (ROCEPHIN)  IV 1 g (06/03/17 1804)  . dextrose 5 % and 0.45% NaCl 1,000 mL (06/03/17 2338)  . levETIRAcetam 750 mg (06/04/17 0907)     LOS: 1 day   Charlynne Cousins  [facility] Pager [phone]  *Please refer to [email or web address], password [email or web address] to get updated schedule on who will round on this patient, as hospitalists switch teams weekly. If 7PM-7AM, please contact night-coverage at [email or web address], password [email or web address] for any overnight needs.  06/04/2017, 9:14 AM

## 2017-06-04 NOTE — Evaluation (Signed)
Clinical/Bedside Swallow Evaluation Patient Details  Name: Mike Williams MRN: [id number] Date of Birth: 03/13/1933  Today's Date: 06/04/2017 Time: SLP Start Time (ACUTE ONLY): 1215 SLP Stop Time (ACUTE ONLY): 1302 SLP Time Calculation (min) (ACUTE ONLY): 47 min  Past Medical History:  Past Medical History:  Diagnosis Date  . Aortic insufficiency 07/04/2011   2/13 Study Conclusions  - Left ventricle: Systolic function was normal. The estimated ejection fraction was in the range of 55% to 60%. - Aortic valve: Moderate regurgitation. - Aorta: The aorta was moderately dilated. Transthoracic echocardiography. M-mode, complete 2D, spectral Doppler, and color Doppler. Height: Height: 170.2cm. Height: 67in. Weight: Weight: 84.1kg. Weight: 185lb. Body mass index: BMI: 29kg/m^2. Body surface area: BSA: 2.77m^2. Blood pressure: 175/86. Patient status: Inpatient. Location: Bedside.      . Aortic root dilatation (HCC)    Noted on 09/2010 echo, with mild-mod AI  . CEREBROVASCULAR ACCIDENT, HX OF 02/09/2007  . DEMENTIA 05/12/2007  . Hemorrhoids   . HYPERGLYCEMIA 09/21/2009  . HYPERTENSION 12/29/2006  . Lactose intolerance   . OBSTRUCTIVE SLEEP APNEA 12/29/2006  . PROSTATE CANCER, HX OF 12/29/2006  . Prostatitis   . Risk for falls   . Seizures ([place])    Past Surgical History:  Past Surgical History:  Procedure Laterality Date  . CHOLECYSTECTOMY    . GANGLION CYST EXCISION    . PROSTATE SURGERY     cryotherapy   HPI:  84 yo male with h/o dementia, cva hx and aphasia adm to [facility] with desaturations with concern for UTI and influenza    PMH + for dementia, TIAs, CVA, prostate cancer, dysphagia.  Pt CXR 1/29 showed tortuous ectatic aorta and cardiomegaly.  Swallow evaluation ordered, yesterday pt was lethargic and congested with suspected secretion aspiration.     Assessment / Plan / Recommendation Clinical Impression  Pt with cognitive based dysphagia and concern for possible aspiration with thin/nectar via  straw.  No indication of airway compromise with thin, nectar via cup, applesauce or graham cracker.  Pt with rotary mastication pattern and no oral residuals.  Mild delay in swallow presumed and multiple swallows with liquids likely indicative of piecemealing.   Recommend dys3/thin with strict precautions encouraging pt to help feed himself, hand over hand.  Son Mike Williams present and educated to recommendations.  Son also advised this SLP that pt "doesn't swallow well" prior to admit and was on soft/thin diet.   Will follow up to assure tolerance, indication of instrumental evaluation.  SLP Visit Diagnosis: Dysphagia, oropharyngeal phase (R13.12)    Aspiration Risk  Mild aspiration risk    Diet Recommendation Dysphagia 3 (Mech soft);Thin liquid   Liquid Administration via: Cup;No straw Medication Administration: Whole meds with puree Supervision: Staff to assist with self feeding Compensations: Minimize environmental distractions;Slow rate;Small sips/bites(oral suction prn) Postural Changes: Seated upright at 90 degrees;Remain upright for at least 30 minutes after po intake    Other  Recommendations Oral Care Recommendations: Oral care BID   Follow up Recommendations Skilled Nursing facility      Frequency and Duration min 1 x/week  1 week       Prognosis Prognosis for Safe Diet Advancement: Fair Barriers to Reach Goals: Cognitive deficits      Swallow Study   General Date of Onset: 06/04/17 HPI: 84 yo male with h/o dementia, cva hx and aphasia adm to [facility] with desaturations with concern for UTI and influenza    PMH + for dementia, TIAs, CVA, prostate cancer, dysphagia.  Pt CXR 1/29  showed tortuous ectatic aorta and cardiomegaly.  Swallow evaluation ordered, yesterday pt was lethargic and congested with suspected secretion aspiration.   Type of Study: Bedside Swallow Evaluation Previous Swallow Assessment: MBS 01/20/2012 oral transit delays, strong pharyngeal swallow, barium tablet  lodged at UES- rec regular/thin diet Diet Prior to this Study: NPO Temperature Spikes Noted: Yes(99.3) Respiratory Status: Nasal cannula History of Recent Intubation: No Behavior/Cognition: Alert;Distractible;Doesn't follow directions Oral Cavity Assessment: Within Functional Limits Oral Care Completed by SLP: No Oral Cavity - Dentition: Poor condition Self-Feeding Abilities: Needs assist(hand over hand assistance provided) Patient Positioning: Upright in bed Baseline Vocal Quality: Low vocal intensity(when pt verbalized x2 during session) Volitional Cough: Congested Volitional Swallow: Unable to elicit    Oral/Motor/Sensory Function Overall Oral Motor/Sensory Function: Other (comment)(? decreased facial fullness on right, bilateral palatal elevation, decreased eye opening on left, pt seals lip on straw on right side only)   Ice Chips Ice chips: Not tested   Thin Liquid Thin Liquid: Impaired Presentation: Cup;Self Fed;Spoon;Straw Oral Phase Impairments: Reduced labial seal Pharyngeal  Phase Impairments: Cough - Immediate;Multiple swallows    Nectar Thick Nectar Thick Liquid: Impaired Presentation: Straw;Self Fed Oral Phase Impairments: Reduced labial seal Pharyngeal Phase Impairments: Cough - Immediate;Cough - Delayed   Honey Thick Honey Thick Liquid: Not tested   Puree Puree: Within functional limits Presentation: Spoon   Solid   GO   Solid: Within functional limits Presentation: Self Mike Williams, Mike Williams 06/04/2017,2:12 PM  Luanna Salk, [place] [facility] SLP [phone]

## 2017-06-04 NOTE — Telephone Encounter (Signed)
Post ED Visit - Positive Culture Follow-up  Culture report reviewed by antimicrobial stewardship pharmacist:  []  Elenor Quinones, Pharm.D. []  Heide Guile, Pharm.D., BCPS AQ-ID [x]  Parks Neptune, Pharm.D., BCPS []  Alycia Rossetti, Pharm.D., BCPS []  [place], [place].D., BCPS, AAHIVP []  Legrand Como, Pharm.D., BCPS, AAHIVP []  Salome Arnt, PharmD, BCPS []  Jalene Mullet, PharmD []  Vincenza Hews, PharmD, BCPS  Positive urine culture Treated with Cephalexin, organism sensitive to the same and no further patient follow-up is required at this time.  Harlon Flor [facility] 06/04/2017, 11:06 AM

## 2017-06-04 NOTE — Consult Note (Signed)
Consultation Note Date: 06/04/2017   Patient Name: Mike Williams  DOB: 1932/11/03  MRN: [id number]  Age / Sex: 84 y.o., male  PCP: [name], DO Referring Physician: Charlynne Cousins, MD  Reason for Consultation: Establishing goals of care  HPI/Patient Profile: 84 y.o. male  with past medical history of vascular dementia, prostate cancer, OSA, aortic regurg, seizures and malnutrition who was admitted on 06/02/2017 with shortness of breath and flu like symptoms.  He was found to be positive for influenza A and MRSA bacteremia as well as a UTI .   Clinical Assessment and Goals of Care:  I have reviewed medical records including EPIC notes, labs and imaging, received report from the care team, assessed the patient and then talked on the phone with his wife  to discuss diagnosis prognosis, GOC, EOL wishes, disposition and options.  I introduced Palliative Medicine as specialized medical care for people living with serious illness. It focuses on providing relief from the symptoms and stress of a serious illness. The goal is to improve quality of life for both the patient and the family.  We discussed a brief life review of the patient.  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  He and his wife are from [place] and have 2 sons.  1 son lives in [place] - the other is still in [place].  Mr. Mike Williams was diagnosed with vascular dementia 15 years ago.  His wife took care of him at home until last year.  He no longer speaks.  About 6 months ago he had two falls and is now wheel chair bound.  His wife reports that 1 week ago he was still eating well although he has been "squireling" or pocketing his food for years.  His wife has to give him encouragement to swallow but his appetite is good and he eats well.  Advanced directives, concepts specific to code status, artifical feeding and hydration, and rehospitalization were  considered and discussed.  Mike Williams would not want her husband to be fed artificially or have a feeding tube.  We discussed code status - she listened and then asked to discuss a change to DNR with her sons before doing it.    Hospice and Palliative Care services outpatient were explained and offered. I explained that if [name] appetite does not pick back up he will be eligible for hospice services at [facility] or hospice house.    Mike Williams was very Patent attorney of our conversation.  Questions and concerns were addressed. The family was encouraged to call with questions or concerns.    Primary Decision Maker:  NEXT OF KIN wife she also includes her two sons in her decision making    SUMMARY OF RECOMMENDATIONS     Assist with careful hand feeding.  No feeding tube.  Patient's family considering change of code status.  PMT will reassess prior to discharge to determine hospice eligibility.  Code Status/Advance Care Planning:  Full code  Additional Recommendations (Limitations, Scope, Preferences):  No Artificial  Feeding   Psycho-social/Spiritual:   Desire for further Chaplaincy support: not discussed  Prognosis:   To be determined.  Pending how well he eats.    Discharge Planning: Return to long term nursing facility when appropriate.  Will reassess to determine eligibility for Hospice Services.      Primary Diagnoses: Present on Admission: . Influenza . Essential hypertension . Toxic encephalopathy   I have reviewed the medical record, interviewed the patient and family, and examined the patient. The following aspects are pertinent.  Past Medical History:  Diagnosis Date  . Aortic insufficiency 07/04/2011   2/13 Study Conclusions  - Left ventricle: Systolic function was normal. The estimated ejection fraction was in the range of 55% to 60%. - Aortic valve: Moderate regurgitation. - Aorta: The aorta was moderately dilated. Transthoracic echocardiography. M-mode,  complete 2D, spectral Doppler, and color Doppler. Height: Height: 170.2cm. Height: 67in. Weight: Weight: 84.1kg. Weight: 185lb. Body mass index: BMI: 29kg/m^2. Body surface area: BSA: 2.53m^2. Blood pressure: 175/86. Patient status: Inpatient. Location: Bedside.      . Aortic root dilatation (HCC)    Noted on 09/2010 echo, with mild-mod AI  . CEREBROVASCULAR ACCIDENT, HX OF 02/09/2007  . DEMENTIA 05/12/2007  . Hemorrhoids   . HYPERGLYCEMIA 09/21/2009  . HYPERTENSION 12/29/2006  . Lactose intolerance   . OBSTRUCTIVE SLEEP APNEA 12/29/2006  . PROSTATE CANCER, HX OF 12/29/2006  . Prostatitis   . Risk for falls   . Seizures ([place])    Social History   Socioeconomic History  . Marital status: Married    Spouse name: None  . Number of children: 2  . Years of education: master's  . Highest education level: None  Social Needs  . Financial resource strain: None  . Food insecurity - worry: None  . Food insecurity - inability: None  . Transportation needs - medical: None  . Transportation needs - non-medical: None  Occupational History  . Occupation: retired  Tobacco Use  . Smoking status: Never Smoker  . Smokeless tobacco: Never Used  Substance and Sexual Activity  . Alcohol use: No  . Drug use: No  . Sexual activity: None  Other Topics Concern  . 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       Patient does not drink caffeine.   Patient is right handed.   Family History  Problem Relation Age of Onset  . Heart attack Father   . Heart disease Father   . Cancer Sister    Scheduled Meds: . clopidogrel  75 mg Oral Daily  . enoxaparin (LOVENOX) injection  40 mg Subcutaneous QHS  . escitalopram  10 mg Oral Daily  . felodipine  10 mg Oral Daily  . hydrALAZINE  100 mg Oral Q8H  . irbesartan  300 mg Oral Daily  . lubriderm seriously sensitive  1 application Topical BID  . oseltamivir  75 mg Oral BID   Continuous Infusions: . cefTRIAXone (ROCEPHIN)   IV 1 g (06/03/17 1804)  . levETIRAcetam Stopped (06/04/17 0920)   PRN Meds:.acetaminophen **OR** acetaminophen, hydrALAZINE No Known Allergies Review of Systems patient does not speak  Physical Exam  Thin elderly male, sitting up, a tear streaming down his face.  He nods "yes" to all questions asked CV rrr resp no distress Abdomen soft, nt,nd Extrem no edema.  Vital Signs: BP (!) 145/65 (BP Location: Right Arm)   Pulse 68   Temp 98 F (36.7 C) (Axillary)   Resp 18  Ht 6' (1.829 m)   SpO2 100%   BMI 19.67 kg/m  Pain Assessment: No/denies pain       SpO2: SpO2: 100 % O2 Device:SpO2: 100 % O2 Flow Rate: .   IO: Intake/output summary:   Intake/Output Summary (Last 24 hours) at 06/04/2017 1507 Last data filed at 06/04/2017 0600 Gross per 24 hour  Intake 1357.5 ml  Output 600 ml  Net 757.5 ml    LBM:   Baseline Weight:   Most recent weight:       Palliative Assessment/Data: 30%     Time In: 2:00 Time Out: 2:50 Time Total: 50 min. Greater than 50%  of this time was spent counseling and coordinating care related to the above assessment and plan.  Signed by: Florentina Jenny, PA-C Palliative Medicine Pager: [phone]  Please contact Palliative Medicine Team phone at [phone] for questions and concerns.  For individual provider: See Shea Evans

## 2017-06-04 NOTE — Progress Notes (Signed)
PHARMACY - PHYSICIAN COMMUNICATION CRITICAL VALUE ALERT - BLOOD CULTURE IDENTIFICATION (BCID)  Mike Williams is an 84 y.o. male who presented to [facility] on 06/02/2017 with a chief complaint of UTI  Assessment:  Patient with bacteremia staphylococcus species with methiciliin resistance noted.. (include suspected source if known)  Name of physician (or Provider) Contacted: Tylene Fantasia  Current antibiotics: Ceftriaxone 1gm iv q24hr    Changes to prescribed antibiotics recommended:  Since the result was NOT MRSA, and possible contaminate will hold off add vancomycin at this time and await final culture results. Recommendations accepted by provider  Results for orders placed or performed during the hospital encounter of 06/02/17  Blood Culture ID Panel (Reflexed) (Collected: 06/02/2017  5:22 PM)  Result Value Ref Range   Enterococcus species NOT DETECTED NOT DETECTED   Listeria monocytogenes NOT DETECTED NOT DETECTED   Staphylococcus species DETECTED (A) NOT DETECTED   Staphylococcus aureus NOT DETECTED NOT DETECTED   Methicillin resistance DETECTED (A) NOT DETECTED   Streptococcus species NOT DETECTED NOT DETECTED   Streptococcus agalactiae NOT DETECTED NOT DETECTED   Streptococcus pneumoniae NOT DETECTED NOT DETECTED   Streptococcus pyogenes NOT DETECTED NOT DETECTED   Acinetobacter baumannii NOT DETECTED NOT DETECTED   Enterobacteriaceae species NOT DETECTED NOT DETECTED   Enterobacter cloacae complex NOT DETECTED NOT DETECTED   Escherichia coli NOT DETECTED NOT DETECTED   Klebsiella oxytoca NOT DETECTED NOT DETECTED   Klebsiella pneumoniae NOT DETECTED NOT DETECTED   Proteus species NOT DETECTED NOT DETECTED   Serratia marcescens NOT DETECTED NOT DETECTED   Haemophilus influenzae NOT DETECTED NOT DETECTED   Neisseria meningitidis NOT DETECTED NOT DETECTED   Pseudomonas aeruginosa NOT DETECTED NOT DETECTED   Candida albicans NOT DETECTED NOT DETECTED   Candida glabrata NOT DETECTED  NOT DETECTED   Candida krusei NOT DETECTED NOT DETECTED   Candida parapsilosis NOT DETECTED NOT DETECTED   Candida tropicalis NOT DETECTED NOT DETECTED    Nani Skillern Crowford 06/04/2017  3:37 AM

## 2017-06-05 DIAGNOSIS — J111 Influenza due to unidentified influenza virus with other respiratory manifestations: Secondary | ICD-10-CM

## 2017-06-05 DIAGNOSIS — R0902 Hypoxemia: Secondary | ICD-10-CM

## 2017-06-05 DIAGNOSIS — N390 Urinary tract infection, site not specified: Secondary | ICD-10-CM

## 2017-06-05 DIAGNOSIS — I10 Essential (primary) hypertension: Secondary | ICD-10-CM

## 2017-06-05 LAB — CULTURE, BLOOD (ROUTINE X 2)
Culture: NO GROWTH
Culture: NO GROWTH
SPECIAL REQUESTS: ADEQUATE
Special Requests: ADEQUATE

## 2017-06-05 MED ORDER — CEFAZOLIN SODIUM-DEXTROSE 1-4 GM/50ML-% IV SOLN
1.0000 g | Freq: Three times a day (TID) | INTRAVENOUS | Status: DC
  Administered 2017-06-05 – 2017-06-07 (×6): 1 g via INTRAVENOUS
  Filled 2017-06-05 (×8): qty 50

## 2017-06-05 NOTE — Progress Notes (Addendum)
PROGRESS NOTE        PATIENT DETAILS Name: Mike Williams Age: 84 y.o. Sex: male Date of Birth: [birth year] Admit Date: 06/02/2017 Admitting Physician Adam Sharene Butters, MD [id number], Brayton Layman, DO  Brief Narrative: Patient is a 84 y.o. male with history of vascular dementia, seizure disorder, moderate aortic insufficiency, hypertension who presented to the hospital from his skilled nursing facility due to hypoxic respiratory failure secondary to influenza A and suspected aspiration pneumonitis.  He was empirically started on Tamiflu, empiric antimicrobial therapy and subsequently admitted to the hospitalist service.  See below for further details.  Subjective: Lying comfortably in bed-not in any distress-occasionally follows commands.  Answers in one-word sentences (at times).  Assessment/Plan: Acute hypoxic respiratory failure active secondary to influenza and possible aspiration pneumonitis: Slowly improving with Tamiflu and empiric antimicrobial therapy.  He no longer is on oxygen.  Appreciate speech therapy evaluation-remains on a dysphagia 3 diet.  His overall prognosis is poor given his advanced age/dementia and other comorbidities.  Possible MRSA bacteremia: Start IV vancomycin-await final blood culture results-blood culture repeated on 2/1.  If he indeed does have MRSA bacteremia-he will be a poor candidate for extensive evaluation-May just be appropriate to treat him empirically for a few weeks.  Will discuss with ID if his blood cultures indeed come back positive for MRSA.  Addendum 2:20 PM: Informed by pharmacy earlier-did this is probably coag-negative staph bacteremia-we will hold off on vancomycin-await repeat blood cultures.  E. coli UTI: Very hard to discern if he actually has a UTI due to his advanced dementia-but his symptoms on admission were more consistent with respiratory issues-in any event he is on Rocephin.  Acute metabolic encephalopathy: Likely  secondary to hypoxia and UTI/aspiration pneumonitis-appears to have dementia-suspect his current mental status is not that far from his usual baseline.  CT head on 1/27 was negative  Dysphagia: Appreciate speech therapy evaluation-continue with dysphagia 3 diet with thin liquids.  Seizures: Seizure-free during this hospital stay-continue Keppra.  Hypertension: Controlled-continue with felodipine, hydralazine and Avapro.  Follow and adjust accordingly.  History of CVA: Continue Plavix.  Dementia/depression: Reviewed outpatient neurology note (September 2017) apparently has end-stage Alzheimer's dementia.  Reported history of moderate aortic stenosis: Given advanced dementia-frailty-clearly not a candidate for workup/further evaluation/treatment  Palliative care: Unfortunate 84 year old male with what appears to be end-stage dementia-seizure disorder-admitted with influenza and concern for aspiration pneumonitis.  His overall prognosis is poor-he is clearly not a candidate for aggressive care.  Appreciate palliative care input-we will await further recommendations  DVT Prophylaxis: Prophylactic Lovenox  Code Status: Full code   Family Communication: None at bedside  Disposition Plan: Remain inpatient-back to SNF on discharge once further culture data is obtained.  Probably will take another few more days.  Antimicrobial agents: Anti-infectives (From admission, onward)   Start     Dose/Rate Route Frequency Ordered Stop   06/04/17 1800  vancomycin (VANCOCIN) 1,250 mg in sodium chloride 0.9 % 250 mL IVPB  Status:  Discontinued     1,250 mg 166.7 mL/hr over 90 Minutes Intravenous Every 12 hours 06/04/17 0334 06/04/17 0350   06/04/17 0345  vancomycin (VANCOCIN) 1,250 mg in sodium chloride 0.9 % 250 mL IVPB  Status:  Discontinued     1,250 mg 166.7 mL/hr over 90 Minutes Intravenous  Once 06/04/17 0334 06/04/17 0350   06/03/17 1800  cefTRIAXone (ROCEPHIN)  1 g in dextrose 5 % 50 mL IVPB   Status:  Discontinued     1 g 100 mL/hr over 30 Minutes Intravenous Every 24 hours 06/02/17 2306 06/03/17 1001   06/03/17 1800  cefTRIAXone (ROCEPHIN) 1 g in dextrose 5 % 50 mL IVPB     1 g 100 mL/hr over 30 Minutes Intravenous Every 24 hours 06/03/17 1005     06/03/17 1015  sulfamethoxazole-trimethoprim (BACTRIM DS,SEPTRA DS) 800-160 MG per tablet 1 tablet  Status:  Discontinued     1 tablet Oral Every 12 hours 06/03/17 1001 06/03/17 1005   06/02/17 2315  oseltamivir (TAMIFLU) 6 MG/ML suspension 75 mg  Status:  Discontinued     75 mg Oral 2 times daily 06/02/17 2306 06/02/17 2307   06/02/17 2315  oseltamivir (TAMIFLU) 6 MG/ML suspension 75 mg     75 mg Oral 2 times daily 06/02/17 2307 06/07/17 0959   06/02/17 1815  cefTRIAXone (ROCEPHIN) 1 g in dextrose 5 % 50 mL IVPB     1 g 100 mL/hr over 30 Minutes Intravenous  Once 06/02/17 1813 06/02/17 1859   06/02/17 1815  azithromycin (ZITHROMAX) 500 mg in dextrose 5 % 250 mL IVPB     500 mg 250 mL/hr over 60 Minutes Intravenous  Once 06/02/17 1813 06/02/17 2137      Procedures: None  CONSULTS:  Palliative care  Time spent: 25- minutes-Greater than 50% of this time was spent in counseling, explanation of diagnosis, planning of further management, and coordination of care.  MEDICATIONS: Scheduled Meds: . clopidogrel  75 mg Oral Daily  . enoxaparin (LOVENOX) injection  40 mg Subcutaneous QHS  . escitalopram  10 mg Oral Daily  . felodipine  10 mg Oral Daily  . hydrALAZINE  100 mg Oral Q8H  . irbesartan  300 mg Oral Daily  . lubriderm seriously sensitive  1 application Topical BID  . oseltamivir  75 mg Oral BID   Continuous Infusions: . cefTRIAXone (ROCEPHIN)  IV Stopped (06/04/17 1812)  . levETIRAcetam Stopped (06/04/17 2245)   PRN Meds:.acetaminophen **OR** acetaminophen, hydrALAZINE   PHYSICAL EXAM: Vital signs: Vitals:   06/04/17 0700 06/04/17 1440 06/04/17 2124 06/05/17 0610  BP:  (!) 145/65 (!) 179/76 (!) 147/64  Pulse:   68 (!) 55 62  Resp:   18 16  Temp:  98 F (36.7 C) 98.7 F (37.1 C) 99.3 F (37.4 C)  TempSrc:  Axillary Axillary Axillary  SpO2:  100% 100% 100%  Height: 6' (1.829 m)      There were no vitals filed for this visit. Body mass index is 19.67 kg/m.   General appearance:Awake but pleasantly confused.  Frail and chronically sick appearing., Eyes:, pupils equally reactive to light and accomodation,no scleral icterus.Pink conjunctiva HEENT: Atraumatic and Normocephalic Neck: supple, no JVD.  Resp:Good air entry bilaterally-clear to auscultation anteriorly CVS: S1 S2 regular, no murmurs.  GI: Bowel sounds present, Non tender and not distended with no gaurding, rigidity or rebound.No organomegaly Extremities: B/L Lower Ext shows no edema, both legs are warm to touch Neurology: Difficult exam-but seems to be moving all 4 extremities-he has significant generalized weakness all over. Psychiatric: Pleasantly confused Musculoskeletal:No digital cyanosis Skin:No Rash, warm and dry Wounds:N/A  I have personally reviewed following labs and imaging studies  LABORATORY DATA: CBC: Recent Labs  Lab 05/31/17 2159 06/02/17 1655 06/03/17 0421  WBC 9.0 7.2 5.6  NEUTROABS 7.2 4.4  --   HGB 14.3 13.4 11.8*  HCT 43.2 40.2 36.0*  MCV 87.1 86.6 85.9  PLT 124* 162 332    Basic Metabolic Panel: Recent Labs  Lab 05/31/17 2159 06/02/17 1655 06/02/17 1815 06/03/17 0421  NA 136 139  --  141  K 4.0 3.8  --  3.5  CL 98* 104  --  107  CO2 27 28  --  29  GLUCOSE 177* 112*  --  95  BUN 12 20  --  20  CREATININE 1.20 0.94  --  0.83  CALCIUM 8.9 8.9  --  8.5*  MG  --   --  1.9  --   PHOS  --   --  3.2  --     GFR: Estimated Creatinine Clearance: 63.9 mL/min (by C-G formula based on SCr of 0.83 mg/dL).  Liver Function Tests: Recent Labs  Lab 05/31/17 2159 06/02/17 1655  AST 36 26  ALT 27 21  ALKPHOS 114 97  BILITOT 0.6 0.5  PROT 7.5 7.1  ALBUMIN 3.4* 3.2*   No results for  input(s): LIPASE, AMYLASE in the last 168 hours. No results for input(s): AMMONIA in the last 168 hours.  Coagulation Profile: Recent Labs  Lab 05/31/17 2159 06/02/17 1655  INR 1.09 1.01    Cardiac Enzymes: Recent Labs  Lab 06/02/17 1655  TROPONINI <0.03    BNP (last 3 results) No results for input(s): PROBNP in the last 8760 hours.  HbA1C: No results for input(s): HGBA1C in the last 72 hours.  CBG: No results for input(s): GLUCAP in the last 168 hours.  Lipid Profile: No results for input(s): CHOL, HDL, LDLCALC, TRIG, CHOLHDL, LDLDIRECT in the last 72 hours.  Thyroid Function Tests: No results for input(s): TSH, T4TOTAL, FREET4, T3FREE, THYROIDAB in the last 72 hours.  Anemia Panel: No results for input(s): VITAMINB12, FOLATE, FERRITIN, TIBC, IRON, RETICCTPCT in the last 72 hours.  Urine analysis:    Component Value Date/Time   COLORURINE YELLOW 05/31/2017 2306   APPEARANCEUR CLEAR 05/31/2017 2306   LABSPEC 1.026 05/31/2017 2306   PHURINE 5.0 05/31/2017 2306   GLUCOSEU NEGATIVE 05/31/2017 2306   HGBUR NEGATIVE 05/31/2017 2306   BILIRUBINUR NEGATIVE 05/31/2017 2306   BILIRUBINUR n 01/08/2012 1122   KETONESUR NEGATIVE 05/31/2017 2306   PROTEINUR 100 (A) 05/31/2017 2306   UROBILINOGEN 1.0 09/23/2014 1245   NITRITE POSITIVE (A) 05/31/2017 2306   LEUKOCYTESUR MODERATE (A) 05/31/2017 2306    Sepsis Labs: Lactic Acid, Venous    Component Value Date/Time   LATICACIDVEN 1.4 06/02/2017 1722    MICROBIOLOGY: Recent Results (from the past 240 hour(s))  Blood Culture (routine x 2)     Status: None (Preliminary result)   Collection Time: 05/31/17 10:05 PM  Result Value Ref Range Status   Specimen Description BLOOD LEFT FOREARM  Final   Special Requests   Final    BOTTLES DRAWN AEROBIC AND ANAEROBIC Blood Culture adequate volume   Culture NO GROWTH 4 DAYS  Final   Report Status PENDING  Incomplete  Blood Culture (routine x 2)     Status: None (Preliminary  result)   Collection Time: 05/31/17 10:06 PM  Result Value Ref Range Status   Specimen Description BLOOD RIGHT ANTECUBITAL  Final   Special Requests   Final    BOTTLES DRAWN AEROBIC AND ANAEROBIC Blood Culture adequate volume   Culture NO GROWTH 4 DAYS  Final   Report Status PENDING  Incomplete  Urine culture     Status: Abnormal   Collection Time: 05/31/17 11:06 PM  Result Value  Ref Range Status   Specimen Description URINE, CLEAN CATCH  Final   Special Requests NONE  Final   Culture >=100,000 COLONIES/mL ESCHERICHIA COLI (A)  Final   Report Status 06/03/2017 FINAL  Final   Organism ID, Bacteria ESCHERICHIA COLI (A)  Final      Susceptibility   Escherichia coli - MIC*    AMPICILLIN 4 SENSITIVE Sensitive     CEFAZOLIN <=4 SENSITIVE Sensitive     CEFTRIAXONE <=1 SENSITIVE Sensitive     CIPROFLOXACIN <=0.25 SENSITIVE Sensitive     GENTAMICIN <=1 SENSITIVE Sensitive     IMIPENEM <=0.25 SENSITIVE Sensitive     NITROFURANTOIN <=16 SENSITIVE Sensitive     TRIMETH/SULFA <=20 SENSITIVE Sensitive     AMPICILLIN/SULBACTAM 8 SENSITIVE Sensitive     PIP/TAZO <=4 SENSITIVE Sensitive     Extended ESBL NEGATIVE Sensitive     * >=100,000 COLONIES/mL ESCHERICHIA COLI  Culture, blood (routine x 2)     Status: None (Preliminary result)   Collection Time: 06/02/17  5:22 PM  Result Value Ref Range Status   Specimen Description   Final    BLOOD BLOOD LEFT HAND Performed at [facility], [place] [address] [place], [place] [address]    Special Requests   Final    BOTTLES DRAWN AEROBIC AND ANAEROBIC Blood Culture adequate volume Performed at [place] [address], [place], [place] [address]    Culture  Setup Time   Final    GRAM POSITIVE COCCI IN BOTH AEROBIC AND ANAEROBIC BOTTLES CRITICAL RESULT CALLED TO, READ BACK BY AND VERIFIED WITH: Lavell Luster [facility] 06/04/17 0308 JDW Performed at [facility], [place] [address], [place], [place] [address]     Culture GRAM POSITIVE COCCI  Final   Report Status PENDING  Incomplete  Blood Culture ID Panel (Reflexed)     Status: Abnormal   Collection Time: 06/02/17  5:22 PM  Result Value Ref Range Status   Enterococcus species NOT DETECTED NOT DETECTED Final   Listeria monocytogenes NOT DETECTED NOT DETECTED Final   Staphylococcus species DETECTED (A) NOT DETECTED Final    Comment: Methicillin (oxacillin) resistant coagulase negative staphylococcus. Possible blood culture contaminant (unless isolated from more than one blood culture draw or clinical case suggests pathogenicity). No antibiotic treatment is indicated for blood  culture contaminants. CRITICAL RESULT CALLED TO, READ BACK BY AND VERIFIED WITH: J GRIMSLEY PHARMD 06/04/17 0308 JDW    Staphylococcus aureus NOT DETECTED NOT DETECTED Final   Methicillin resistance DETECTED (A) NOT DETECTED Final    Comment: CRITICAL RESULT CALLED TO, READ BACK BY AND VERIFIED WITH: J GRIMSLEY PHARMD 06/04/17 0308 JDW    Streptococcus species NOT DETECTED NOT DETECTED Final   Streptococcus agalactiae NOT DETECTED NOT DETECTED Final   Streptococcus pneumoniae NOT DETECTED NOT DETECTED Final   Streptococcus pyogenes NOT DETECTED NOT DETECTED Final   Acinetobacter baumannii NOT DETECTED NOT DETECTED Final   Enterobacteriaceae species NOT DETECTED NOT DETECTED Final   Enterobacter cloacae complex NOT DETECTED NOT DETECTED Final   Escherichia coli NOT DETECTED NOT DETECTED Final   Klebsiella oxytoca NOT DETECTED NOT DETECTED Final   Klebsiella pneumoniae NOT DETECTED NOT DETECTED Final   Proteus species NOT DETECTED NOT DETECTED Final   Serratia marcescens NOT DETECTED NOT DETECTED Final   Haemophilus influenzae NOT DETECTED NOT DETECTED Final   Neisseria meningitidis NOT DETECTED NOT DETECTED Final   Pseudomonas aeruginosa NOT DETECTED NOT DETECTED Final   Candida albicans NOT DETECTED  NOT DETECTED Final   Candida glabrata NOT DETECTED NOT DETECTED Final    Candida krusei NOT DETECTED NOT DETECTED Final   Candida parapsilosis NOT DETECTED NOT DETECTED Final   Candida tropicalis NOT DETECTED NOT DETECTED Final    Comment: Performed at [facility], [place] [address] [place], [place] [address]  Culture, blood (routine x 2)     Status: None (Preliminary result)   Collection Time: 06/02/17  5:23 PM  Result Value Ref Range Status   Specimen Description BLOOD BLOOD RIGHT HAND  Final   Special Requests   Final    BOTTLES DRAWN AEROBIC AND ANAEROBIC Blood Culture adequate volume   Culture   Final    NO GROWTH 2 DAYS Performed at [facility], [place] [address] [place], [place] [address]    Report Status PENDING  Incomplete    RADIOLOGY STUDIES/RESULTS: Ct Head Wo Contrast  Result Date: 05/31/2017 CLINICAL DATA:  Fall out of bed today. Head and neck trauma. Right forehead hematoma. On blood thinners. Initial encounter. EXAM: CT HEAD WITHOUT CONTRAST CT CERVICAL SPINE WITHOUT CONTRAST TECHNIQUE: Multidetector CT imaging of the head and cervical spine was performed following the standard protocol without intravenous contrast. Multiplanar CT image reconstructions of the cervical spine were also generated. COMPARISON:  Head CT only on 12/23/2015 FINDINGS: CT HEAD FINDINGS Brain: No evidence of acute infarction, hemorrhage, hydrocephalus, extra-axial collection, or mass lesion/mass effect. Moderate diffuse cerebral atrophy and severe chronic small vessel disease again seen. Multiple old lacunar infarcts again seen involving the bilateral basal ganglia and pons. Vascular:  No hyperdense vessel or other acute findings. Skull: No evidence of fracture or other significant bone abnormality. Sinuses/Orbits:  No acute findings. Other: Mild right frontal scalp hematoma. CT CERVICAL SPINE FINDINGS Alignment: Normal. Skull base and vertebrae: No acute fracture. No primary bone lesion or focal pathologic process. Soft tissues and spinal canal: No prevertebral fluid  or swelling. No visible canal hematoma. Disc levels: Mild degenerative disc disease is the this is seen from levels of C2-C7. Mild right-sided facet DJD at C7-T1. Upper chest: Negative. Other: None. IMPRESSION: No acute intracranial abnormality. Stable cerebral atrophy, chronic small vessel disease, and old lacunar infarcts. Mild right frontal scalp hematoma.  No evidence of skull fracture. No evidence of acute cervical spine fracture or subluxation. Degenerative spondylosis, as described above. Electronically Signed   By: Earle Gell [name]   On: 05/31/2017 22:31   Ct Cervical Spine Wo Contrast  Result Date: 05/31/2017 CLINICAL DATA:  Fall out of bed today. Head and neck trauma. Right forehead hematoma. On blood thinners. Initial encounter. EXAM: CT HEAD WITHOUT CONTRAST CT CERVICAL SPINE WITHOUT CONTRAST TECHNIQUE: Multidetector CT imaging of the head and cervical spine was performed following the standard protocol without intravenous contrast. Multiplanar CT image reconstructions of the cervical spine were also generated. COMPARISON:  Head CT only on 12/23/2015 FINDINGS: CT HEAD FINDINGS Brain: No evidence of acute infarction, hemorrhage, hydrocephalus, extra-axial collection, or mass lesion/mass effect. Moderate diffuse cerebral atrophy and severe chronic small vessel disease again seen. Multiple old lacunar infarcts again seen involving the bilateral basal ganglia and pons. Vascular:  No hyperdense vessel or other acute findings. Skull: No evidence of fracture or other significant bone abnormality. Sinuses/Orbits:  No acute findings. Other: Mild right frontal scalp hematoma. CT CERVICAL SPINE FINDINGS Alignment: Normal. Skull base and vertebrae: No acute fracture. No primary bone lesion or focal pathologic process. Soft tissues and spinal canal: No prevertebral fluid or swelling.  No visible canal hematoma. Disc levels: Mild degenerative disc disease is the this is seen from levels of C2-C7. Mild right-sided  facet DJD at C7-T1. Upper chest: Negative. Other: None. IMPRESSION: No acute intracranial abnormality. Stable cerebral atrophy, chronic small vessel disease, and old lacunar infarcts. Mild right frontal scalp hematoma.  No evidence of skull fracture. No evidence of acute cervical spine fracture or subluxation. Degenerative spondylosis, as described above. Electronically Signed   By: Earle Gell [name]   On: 05/31/2017 22:31   Dg Chest Port 1 View  Result Date: 06/02/2017 CLINICAL DATA:  Shortness of Breath EXAM: PORTABLE CHEST 1 VIEW COMPARISON:  05/31/2017 FINDINGS: Cardiomegaly. Tortuous, ectatic thoracic aorta. Bibasilar atelectasis. No effusions or acute bony abnormality. IMPRESSION: Cardiomegaly.  Bibasilar atelectasis. Tortuous, ectatic aorta. Electronically Signed   By: Rolm Baptise [name]   On: 06/02/2017 18:12   Dg Chest Port 1 View  Result Date: 05/31/2017 CLINICAL DATA:  Patient with fever. EXAM: PORTABLE CHEST 1 VIEW COMPARISON:  Chest radiograph 01/12/2012. FINDINGS: Monitoring leads overlie the patient. Stable cardiomegaly. No consolidative pulmonary opacities. No pleural effusion or pneumothorax. IMPRESSION: No acute cardiopulmonary process. Electronically Signed   By: Lovey Newcomer [name]   On: 05/31/2017 21:56     LOS: 2 days   Oren Binet, MD  [facility] Pager:[phone]  If 7PM-7AM, please contact night-coverage [email or web address] Password [email or web address] 06/05/2017, 10:32 AM

## 2017-06-05 NOTE — Progress Notes (Signed)
  Speech Language Pathology Treatment: Dysphagia  Patient Details Name: Mike Williams MRN: [id number] DOB: 1933-01-21 Today's Date: 06/05/2017 Time: 0881-1031 SLP Time Calculation (min) (ACUTE ONLY): 20 min  Assessment / Plan / Recommendation Clinical Impression  Pt seen at bedside for follow up after BSE yesterday (06/04/17). Oral care completed with suction, which revealed pieces of a white pill. Facial twitching was noted on the right - RN informed. After oral cavity cleared, pt was given boluses of puree, thin liquid and nectar thick liquid. Poor bolus manipulation of thin liquid was noted, as well as slow transit of puree. Pt's wife was consulted, and diet recommendations were discussed with her. Wife is in agreement with downgrade to puree and nectar thick liquids, given current state. She also indicates that the facial twitching is not a new behavior for pt. RN informed. Safe swallow precautions updated and posted at [facility]. ST will continue to follow to assess diet tolerance, and provide education. Nursing updated as well.   HPI HPI: 84 yo male with h/o dementia, cva hx and aphasia adm to [facility] with desaturations with concern for UTI and influenza    PMH + for dementia, TIAs, CVA, prostate cancer, dysphagia.  Pt CXR 1/29 showed tortuous ectatic aorta and cardiomegaly.  Swallow evaluation ordered, 06/03/17 pt was lethargic and congested with suspected secretion aspiration.        SLP Plan  Continue with current plan of care       Recommendations  Diet recommendations: Dysphagia 1 (puree);Nectar-thick liquid Liquids provided via: Cup;No straw Medication Administration: Crushed with puree Supervision: Staff to assist with self feeding;Full supervision/cueing for compensatory strategies Compensations: Minimize environmental distractions;Slow rate;Small sips/bites(oral suction PRN) Postural Changes and/or Swallow Maneuvers: Seated upright 90 degrees;Upright 30-60 min after meal                 Oral Care Recommendations: Oral care BID Follow up Recommendations: Skilled Nursing facility SLP Visit Diagnosis: Dysphagia, oropharyngeal phase (R13.12) Plan: Continue with current plan of care       [place] [facility], CCC-SLP Speech Language Pathologist [phone]  Shonna Chock 06/05/2017, 2:29 PM

## 2017-06-05 NOTE — Progress Notes (Signed)
   06/05/17 1500  Clinical Encounter Type  Visited With Family  Spiritual Encounters  Spiritual Needs Prayer   Encountered patient's spouse in the hall reading.  Stopped and talked to her about her husband.  We shared stories of God together and she appreciated the time to talk and the prayers.  Will follow as needed. Chaplain Katherene Ponto

## 2017-06-05 NOTE — Progress Notes (Signed)
MD paged regarding facial twitching in pt reported by speech therapist. Will continue to monitor and await orders.

## 2017-06-06 ENCOUNTER — Other Ambulatory Visit: Payer: Self-pay

## 2017-06-06 LAB — CULTURE, BLOOD (ROUTINE X 2): SPECIAL REQUESTS: ADEQUATE

## 2017-06-06 LAB — BASIC METABOLIC PANEL
Anion gap: 6 (ref 5–15)
BUN: 16 mg/dL (ref 6–20)
CHLORIDE: 105 mmol/L (ref 101–111)
CO2: 28 mmol/L (ref 22–32)
CREATININE: 0.85 mg/dL (ref 0.61–1.24)
Calcium: 8.6 mg/dL — ABNORMAL LOW (ref 8.9–10.3)
Glucose, Bld: 94 mg/dL (ref 65–99)
POTASSIUM: 3.4 mmol/L — AB (ref 3.5–5.1)
SODIUM: 139 mmol/L (ref 135–145)

## 2017-06-06 LAB — CBC
HCT: 33.2 % — ABNORMAL LOW (ref 39.0–52.0)
HEMOGLOBIN: 11.4 g/dL — AB (ref 13.0–17.0)
MCH: 29.1 pg (ref 26.0–34.0)
MCHC: 34.3 g/dL (ref 30.0–36.0)
MCV: 84.7 fL (ref 78.0–100.0)
PLATELETS: 170 10*3/uL (ref 150–400)
RBC: 3.92 MIL/uL — AB (ref 4.22–5.81)
RDW: 14 % (ref 11.5–15.5)
WBC: 7.4 10*3/uL (ref 4.0–10.5)

## 2017-06-06 MED ORDER — CEPHALEXIN 250 MG/5ML PO SUSR: 500.0000 mg | Freq: Two times a day (BID) | ORAL | 0 refills | Status: AC

## 2017-06-06 MED ORDER — POTASSIUM CHLORIDE CRYS ER 20 MEQ PO TBCR
40.0000 meq | EXTENDED_RELEASE_TABLET | Freq: Once | ORAL | Status: AC
  Administered 2017-06-06: 40 meq via ORAL
  Filled 2017-06-06: qty 2

## 2017-06-06 MED ORDER — OSELTAMIVIR PHOSPHATE 6 MG/ML PO SUSR: 75.0000 mg | Freq: Two times a day (BID) | ORAL | 0 refills | Status: DC

## 2017-06-06 NOTE — Progress Notes (Signed)
Dr. Laurena Bering aware via phone pt has not been dc'd to facility as of yet. Attempted to contact SW multiple times via phone since lunch and also made [facility] aware who followed up. MD instructed to change dc til tomorrow and contact SW first thing in am. Pt's wife notified.

## 2017-06-06 NOTE — Discharge Summary (Addendum)
PATIENT DETAILS Name: Mike Williams Age: 84 y.o. Sex: male Date of Birth: 04/16/1933 MRN: [id number]. Admitting Physician: Bennie Pierini, MD WUJ:[id number], [place], [place]  Admit Date: 06/02/2017 Discharge date: 06/06/2017  Recommendations for Outpatient Follow-up:  1. Follow up with PCP in 1-2 weeks 2. Please obtain BMP/CBC in one week 3. Consider palliative care follow-up at SNF 4. Speech therapy follow-up at SNF 5. Please follow up on the following pending results: Final blood culture results  Admitted From:  SNF  Disposition: SNF   Home Health: No  Equipment/Devices: None  Discharge Condition: Stable  CODE STATUS: FULL CODE  Diet recommendation:  Heart Healthy / Carb Modified/ Dysphagia 1 with full aspiration precautions (see below as well)  Brief Summary: See H&P, Labs, Consult and Test reports for all details in brief,Patient is a 84 y.o. male with history of vascular dementia, seizure disorder, moderate aortic insufficiency, hypertension who presented to the hospital from his skilled nursing facility due to hypoxic respiratory failure secondary to influenza A and suspected aspiration pneumonitis.  He was empirically started on Tamiflu, empiric antimicrobial therapy and subsequently admitted to the hospitalist service.  See below for further details.  Brief Hospital Course: Acute hypoxic respiratory failure active secondary to influenza and possible aspiration pneumonitis:  Much improved with Tamiflu and empiric antimicrobial therapy.  He no longer is on oxygen.  Appreciate speech therapy evaluation-remains on a dysphagia 1 diet.  His overall prognosis is poor given his advanced age/dementia and other comorbidities.  Since improved-felt stable to be discharged back to SNF.  Consider palliative care follow-up at SNF.  Coagulase-negative bacteremia: Likely a contaminant-repeat blood cultures on 2/1 and negative so far.  Continue to follow until final.  E. coli UTI: Very  hard to discern if he actually has a UTI due to his advanced dementia-but his symptoms on admission were more consistent with respiratory issues-he was treated with IV antimicrobial therapy-on discharge will transition to Keflex.   Acute metabolic encephalopathy: Likely secondary to hypoxia and UTI/aspiration pneumonitis-appears to have dementia-suspect his current mental status is not that far from his usual baseline.  CT head on 1/27 was negative  Dysphagia: Appreciate speech therapy evaluation-continue with dysphagia 3 diet with thin liquids.  Seizures: Seizure-free during this hospital stay-continue Keppra.  Hypertension: Controlled-continue with felodipine, hydralazine and Avapro.  Follow and adjust accordingly.  History of CVA: Continue Plavix.  Dementia/depression: Reviewed outpatient neurology note (September 2017) apparently has end-stage Alzheimer's dementia.  Reported history of moderate aortic stenosis: Given advanced dementia-frailty-clearly not a candidate for workup/further evaluation/treatment  Palliative care: Unfortunate 84 year old male with what appears to be end-stage dementia-seizure disorder-admitted with influenza and concern for aspiration pneumonitis.  His overall prognosis is poor-he is clearly not a candidate for aggressive care.    Seen by palliative care during this hospital stay-will request that palliative care continue to follow him at [facility].    Procedures/Studies: None  Discharge Diagnoses:  Active Problems:   Essential hypertension   Seizures (HCC)   Influenza   Acute lower UTI   Toxic encephalopathy   Normocytic anemia   Severe protein-calorie malnutrition ([place])   Vascular dementia without behavioral disturbance   Palliative care encounter   Discharge Instructions:  Activity:  As tolerated with Full fall precautions use walker/cane & assistance as needed  Discharge Instructions    Diet - low sodium heart healthy   Complete by:  As  directed    Diet recommendations: Dysphagia 1 (puree);Nectar-thick liquid Liquids provided via: Cup;No straw Medication  Administration: Crushed with puree Supervision: Staff to assist with self feeding;Full supervision/cueing for compensatory strategies Compensations: Minimize environmental distractions;Slow rate;Small sips/bites(oral suction PRN) Postural Changes and/or Swallow Maneuvers: Seated upright 90 degrees;Upright 30-60 min after meal      Discharge instructions   Complete by:  As directed    Follow with Primary MD  [name], DO  and other consultant's as instructed your Hospitalist MD  Please get a complete blood count and chemistry panel checked by your Primary MD at your next visit, and again as instructed by your Primary MD.  Get Medicines reviewed and adjusted: Please take all your medications with you for your next visit with your Primary MD  Laboratory/radiological data: Please request your Primary MD to go over all hospital tests and procedure/radiological results at the follow up, please ask your Primary MD to get all Hospital records sent to his/her office.  In some cases, they will be blood work, cultures and biopsy results pending at the time of your discharge. Please request that your primary care [name] follows up on these results.  Also Note the following: If you experience worsening of your admission symptoms, develop shortness of breath, life threatening emergency, suicidal or homicidal thoughts you must seek medical attention immediately by calling 911 or calling your MD immediately  if symptoms less severe.  You must read complete instructions/literature along with all the possible adverse reactions/side effects for all the Medicines you take and that have been prescribed to you. Take any new Medicines after you have completely understood and accpet all the possible adverse reactions/side effects.   Do not drive when taking Pain medications or sleeping  medications (Benzodaizepines)  Do not take more than prescribed Pain, Sleep and Anxiety Medications. It is not advisable to combine anxiety,sleep and pain medications without talking with your primary care practitioner  Special Instructions: If you have smoked or chewed Tobacco  in the last 2 yrs please stop smoking, stop any regular Alcohol  and or any Recreational drug use.  Wear Seat belts while driving.  Please note: You were cared for by a hospitalist during your hospital stay. Once you are discharged, your primary care physician will handle any further medical issues. Please note that NO REFILLS for any discharge medications will be authorized once you are discharged, as it is imperative that you return to your primary care physician (or establish a relationship with a primary care physician if you do not have one) for your post hospital discharge needs so that they can reassess your need for medications and monitor your lab values.   Increase activity slowly   Complete by:  As directed      Allergies as of 06/06/2017   No Known Allergies     Medication List    TAKE these medications   acetaminophen 500 MG tablet Commonly known as:  TYLENOL Take 2 tablets (1,000 mg total) by mouth every 6 (six) hours as needed. What changed:    reasons to take this  Another medication with the same name was removed. Continue taking this medication, and follow the directions you see here.   betamethasone dipropionate 0.05 % cream Commonly known as:  DIPROLENE Apply 1 application topically daily. Apply to Groin area   cephALEXin 250 MG/5ML suspension Commonly known as:  KEFLEX Take 10 mLs (500 mg total) by mouth 2 (two) times daily for 3 days.   clopidogrel 75 MG tablet Commonly known as:  PLAVIX take 1 tablet by mouth once daily  escitalopram 5 MG tablet Commonly known as:  LEXAPRO Take 10 mg by mouth daily. For depression   eucerin lotion Apply to Dry skin topically two times  daily   felodipine 10 MG 24 hr tablet Commonly known as:  PLENDIL Take 10 mg by mouth daily.   HYDRALAZINE HCL PO Give 100 mg by mouth three times a day for Prophylaxis   levETIRAcetam 750 MG tablet Commonly known as:  KEPPRA Take 750 mg by mouth 2 (two) times daily.   NUEDEXTA 20-10 MG Caps Generic drug:  Dextromethorphan-Quinidine Take 1 capsule by mouth 2 (two) times daily.   NUTRITIONAL SUPPLEMENT Li[facility] House supplement - Med Pass - Give 120 cc by mouth three times daily Nutritional treat - Give 4 ounces by mouth three times daily   NUTRITIONAL SUPPLEMENT PO HSG NAS Diet - HSG Mech Soft Texture, Regular consistency   olmesartan 40 MG tablet Commonly known as:  BENICAR Take 40 mg by mouth daily.   ondansetron 4 MG disintegrating tablet Commonly known as:  ZOFRAN ODT Take 1 tablet (4 mg total) by mouth every 8 (eight) hours as needed for nausea or vomiting.   oseltamivir 6 MG/ML Susr suspension Commonly known as:  TAMIFLU Take 12.5 mLs (75 mg total) by mouth 2 (two) times daily. 2 more doses and then stop What changed:  additional instructions      Follow-up Information    [name], DO. Schedule an appointment as soon as possible for a visit in 1 week(s).   Specialty:  Internal Medicine Contact information: [place] [address] [phone]          No Known Allergies  Consultations:   Palliative care  Other Procedures/Studies: Ct Head Wo Contrast  Result Date: 05/31/2017 CLINICAL DATA:  Fall out of bed today. Head and neck trauma. Right forehead hematoma. On blood thinners. Initial encounter. EXAM: CT HEAD WITHOUT CONTRAST CT CERVICAL SPINE WITHOUT CONTRAST TECHNIQUE: Multidetector CT imaging of the head and cervical spine was performed following the standard protocol without intravenous contrast. Multiplanar CT image reconstructions of the cervical spine were also generated. COMPARISON:  Head CT only on 12/23/2015 FINDINGS: CT  HEAD FINDINGS Brain: No evidence of acute infarction, hemorrhage, hydrocephalus, extra-axial collection, or mass lesion/mass effect. Moderate diffuse cerebral atrophy and severe chronic small vessel disease again seen. Multiple old lacunar infarcts again seen involving the bilateral basal ganglia and pons. Vascular:  No hyperdense vessel or other acute findings. Skull: No evidence of fracture or other significant bone abnormality. Sinuses/Orbits:  No acute findings. Other: Mild right frontal scalp hematoma. CT CERVICAL SPINE FINDINGS Alignment: Normal. Skull base and vertebrae: No acute fracture. No primary bone lesion or focal pathologic process. Soft tissues and spinal canal: No prevertebral fluid or swelling. No visible canal hematoma. Disc levels: Mild degenerative disc disease is the this is seen from levels of C2-C7. Mild right-sided facet DJD at C7-T1. Upper chest: Negative. Other: None. IMPRESSION: No acute intracranial abnormality. Stable cerebral atrophy, chronic small vessel disease, and old lacunar infarcts. Mild right frontal scalp hematoma.  No evidence of skull fracture. No evidence of acute cervical spine fracture or subluxation. Degenerative spondylosis, as described above. Electronically Signed   By: Earle Gell [name]   On: 05/31/2017 22:31   Ct Cervical Spine Wo Contrast  Result Date: 05/31/2017 CLINICAL DATA:  Fall out of bed today. Head and neck trauma. Right forehead hematoma. On blood thinners. Initial encounter. EXAM: CT HEAD WITHOUT CONTRAST CT CERVICAL SPINE WITHOUT  CONTRAST TECHNIQUE: Multidetector CT imaging of the head and cervical spine was performed following the standard protocol without intravenous contrast. Multiplanar CT image reconstructions of the cervical spine were also generated. COMPARISON:  Head CT only on 12/23/2015 FINDINGS: CT HEAD FINDINGS Brain: No evidence of acute infarction, hemorrhage, hydrocephalus, extra-axial collection, or mass lesion/mass effect. Moderate  diffuse cerebral atrophy and severe chronic small vessel disease again seen. Multiple old lacunar infarcts again seen involving the bilateral basal ganglia and pons. Vascular:  No hyperdense vessel or other acute findings. Skull: No evidence of fracture or other significant bone abnormality. Sinuses/Orbits:  No acute findings. Other: Mild right frontal scalp hematoma. CT CERVICAL SPINE FINDINGS Alignment: Normal. Skull base and vertebrae: No acute fracture. No primary bone lesion or focal pathologic process. Soft tissues and spinal canal: No prevertebral fluid or swelling. No visible canal hematoma. Disc levels: Mild degenerative disc disease is the this is seen from levels of C2-C7. Mild right-sided facet DJD at C7-T1. Upper chest: Negative. Other: None. IMPRESSION: No acute intracranial abnormality. Stable cerebral atrophy, chronic small vessel disease, and old lacunar infarcts. Mild right frontal scalp hematoma.  No evidence of skull fracture. No evidence of acute cervical spine fracture or subluxation. Degenerative spondylosis, as described above. Electronically Signed   By: Earle Gell [name]   On: 05/31/2017 22:31   Dg Chest Port 1 View  Result Date: 06/02/2017 CLINICAL DATA:  Shortness of Breath EXAM: PORTABLE CHEST 1 VIEW COMPARISON:  05/31/2017 FINDINGS: Cardiomegaly. Tortuous, ectatic thoracic aorta. Bibasilar atelectasis. No effusions or acute bony abnormality. IMPRESSION: Cardiomegaly.  Bibasilar atelectasis. Tortuous, ectatic aorta. Electronically Signed   By: Rolm Baptise [name]   On: 06/02/2017 18:12   Dg Chest Port 1 View  Result Date: 05/31/2017 CLINICAL DATA:  Patient with fever. EXAM: PORTABLE CHEST 1 VIEW COMPARISON:  Chest radiograph 01/12/2012. FINDINGS: Monitoring leads overlie the patient. Stable cardiomegaly. No consolidative pulmonary opacities. No pleural effusion or pneumothorax. IMPRESSION: No acute cardiopulmonary process. Electronically Signed   By: Lovey Newcomer [name]   On:  05/31/2017 21:56     TODAY-DAY OF DISCHARGE:  Subjective:   Dacari Beckstrand today has had no major events overnight-he is stable-he is being discharged back to skilled nursing facility.  Objective:   Blood pressure (!) 164/70, pulse (!) 58, temperature 98.4 F (36.9 C), temperature source Axillary, resp. rate 16, height 6' (1.829 m), weight 65.8 kg (145 lb), SpO2 99 %.  Intake/Output Summary (Last 24 hours) at 06/06/2017 1008 Last data filed at 06/06/2017 0956 Gross per 24 hour  Intake 1002.5 ml  Output 600 ml  Net 402.5 ml   Filed Weights   06/06/17 0900  Weight: 65.8 kg (145 lb)    Exam: Awake-confused-squeezes my hands when I asked him to.   [place].AT,PERRAL Supple Neck,No JVD, No cervical lymphadenopathy appriciated.  Symmetrical Chest wall movement, Good air movement bilaterally, CTAB RRR,No Gallops,Rubs or new Murmurs, No Parasternal Heave +ve B.Sounds, Abd Soft, Non tender, No organomegaly appriciated, No rebound -guarding or rigidity. No Cyanosis, Clubbing or edema, No new Rash or bruise   PERTINENT RADIOLOGIC STUDIES: Ct Head Wo Contrast  Result Date: 05/31/2017 CLINICAL DATA:  Fall out of bed today. Head and neck trauma. Right forehead hematoma. On blood thinners. Initial encounter. EXAM: CT HEAD WITHOUT CONTRAST CT CERVICAL SPINE WITHOUT CONTRAST TECHNIQUE: Multidetector CT imaging of the head and cervical spine was performed following the standard protocol without intravenous contrast. Multiplanar CT image reconstructions of the cervical spine were also generated. COMPARISON:  Head CT only on 12/23/2015 FINDINGS: CT HEAD FINDINGS Brain: No evidence of acute infarction, hemorrhage, hydrocephalus, extra-axial collection, or mass lesion/mass effect. Moderate diffuse cerebral atrophy and severe chronic small vessel disease again seen. Multiple old lacunar infarcts again seen involving the bilateral basal ganglia and pons. Vascular:  No hyperdense vessel or other acute findings.  Skull: No evidence of fracture or other significant bone abnormality. Sinuses/Orbits:  No acute findings. Other: Mild right frontal scalp hematoma. CT CERVICAL SPINE FINDINGS Alignment: Normal. Skull base and vertebrae: No acute fracture. No primary bone lesion or focal pathologic process. Soft tissues and spinal canal: No prevertebral fluid or swelling. No visible canal hematoma. Disc levels: Mild degenerative disc disease is the this is seen from levels of C2-C7. Mild right-sided facet DJD at C7-T1. Upper chest: Negative. Other: None. IMPRESSION: No acute intracranial abnormality. Stable cerebral atrophy, chronic small vessel disease, and old lacunar infarcts. Mild right frontal scalp hematoma.  No evidence of skull fracture. No evidence of acute cervical spine fracture or subluxation. Degenerative spondylosis, as described above. Electronically Signed   By: Earle Gell [name]   On: 05/31/2017 22:31   Ct Cervical Spine Wo Contrast  Result Date: 05/31/2017 CLINICAL DATA:  Fall out of bed today. Head and neck trauma. Right forehead hematoma. On blood thinners. Initial encounter. EXAM: CT HEAD WITHOUT CONTRAST CT CERVICAL SPINE WITHOUT CONTRAST TECHNIQUE: Multidetector CT imaging of the head and cervical spine was performed following the standard protocol without intravenous contrast. Multiplanar CT image reconstructions of the cervical spine were also generated. COMPARISON:  Head CT only on 12/23/2015 FINDINGS: CT HEAD FINDINGS Brain: No evidence of acute infarction, hemorrhage, hydrocephalus, extra-axial collection, or mass lesion/mass effect. Moderate diffuse cerebral atrophy and severe chronic small vessel disease again seen. Multiple old lacunar infarcts again seen involving the bilateral basal ganglia and pons. Vascular:  No hyperdense vessel or other acute findings. Skull: No evidence of fracture or other significant bone abnormality. Sinuses/Orbits:  No acute findings. Other: Mild right frontal scalp  hematoma. CT CERVICAL SPINE FINDINGS Alignment: Normal. Skull base and vertebrae: No acute fracture. No primary bone lesion or focal pathologic process. Soft tissues and spinal canal: No prevertebral fluid or swelling. No visible canal hematoma. Disc levels: Mild degenerative disc disease is the this is seen from levels of C2-C7. Mild right-sided facet DJD at C7-T1. Upper chest: Negative. Other: None. IMPRESSION: No acute intracranial abnormality. Stable cerebral atrophy, chronic small vessel disease, and old lacunar infarcts. Mild right frontal scalp hematoma.  No evidence of skull fracture. No evidence of acute cervical spine fracture or subluxation. Degenerative spondylosis, as described above. Electronically Signed   By: Earle Gell [name]   On: 05/31/2017 22:31   Dg Chest Port 1 View  Result Date: 06/02/2017 CLINICAL DATA:  Shortness of Breath EXAM: PORTABLE CHEST 1 VIEW COMPARISON:  05/31/2017 FINDINGS: Cardiomegaly. Tortuous, ectatic thoracic aorta. Bibasilar atelectasis. No effusions or acute bony abnormality. IMPRESSION: Cardiomegaly.  Bibasilar atelectasis. Tortuous, ectatic aorta. Electronically Signed   By: Rolm Baptise [name]   On: 06/02/2017 18:12   Dg Chest Port 1 View  Result Date: 05/31/2017 CLINICAL DATA:  Patient with fever. EXAM: PORTABLE CHEST 1 VIEW COMPARISON:  Chest radiograph 01/12/2012. FINDINGS: Monitoring leads overlie the patient. Stable cardiomegaly. No consolidative pulmonary opacities. No pleural effusion or pneumothorax. IMPRESSION: No acute cardiopulmonary process. Electronically Signed   By: Lovey Newcomer [name]   On: 05/31/2017 21:56     PERTINENT LAB RESULTS: CBC: Recent Labs  06/06/17 0532  WBC 7.4  HGB 11.4*  HCT 33.2*  PLT 170   CMET CMP     Component Value Date/Time   NA 139 06/06/2017 0532   NA 150 (A) 04/14/2017   K 3.4 (L) 06/06/2017 0532   CL 105 06/06/2017 0532   CO2 28 06/06/2017 0532   GLUCOSE 94 06/06/2017 0532   BUN 16 06/06/2017 0532   BUN  22 (A) 04/14/2017   CREATININE 0.85 06/06/2017 0532   CALCIUM 8.6 (L) 06/06/2017 0532   PROT 7.1 06/02/2017 1655   ALBUMIN 3.2 (L) 06/02/2017 1655   AST 26 06/02/2017 1655   ALT 21 06/02/2017 1655   ALKPHOS 97 06/02/2017 1655   BILITOT 0.5 06/02/2017 1655   GFRNONAA >60 06/06/2017 0532   GFRAA >60 06/06/2017 0532    GFR Estimated Creatinine Clearance: 62.4 mL/min (by C-G formula based on SCr of 0.85 mg/dL). No results for input(s): LIPASE, AMYLASE in the last 72 hours. No results for input(s): CKTOTAL, CKMB, CKMBINDEX, TROPONINI in the last 72 hours. Invalid input(s): POCBNP No results for input(s): DDIMER in the last 72 hours. No results for input(s): HGBA1C in the last 72 hours. No results for input(s): CHOL, HDL, LDLCALC, TRIG, CHOLHDL, LDLDIRECT in the last 72 hours. No results for input(s): TSH, T4TOTAL, T3FREE, THYROIDAB in the last 72 hours.  Invalid input(s): FREET3 No results for input(s): VITAMINB12, FOLATE, FERRITIN, TIBC, IRON, RETICCTPCT in the last 72 hours. Coags: No results for input(s): INR in the last 72 hours.  Invalid input(s): PT Microbiology: Recent Results (from the past 240 hour(s))  Blood Culture (routine x 2)     Status: None   Collection Time: 05/31/17 10:05 PM  Result Value Ref Range Status   Specimen Description BLOOD LEFT FOREARM  Final   Special Requests   Final    BOTTLES DRAWN AEROBIC AND ANAEROBIC Blood Culture adequate volume   Culture   Final    NO GROWTH 5 DAYS Performed at [facility], [address] [place], [place] [address]    Report Status 06/05/2017 FINAL  Final  Blood Culture (routine x 2)     Status: None   Collection Time: 05/31/17 10:06 PM  Result Value Ref Range Status   Specimen Description BLOOD RIGHT ANTECUBITAL  Final   Special Requests   Final    BOTTLES DRAWN AEROBIC AND ANAEROBIC Blood Culture adequate volume   Culture   Final    NO GROWTH 5 DAYS Performed at [facility], [place] [address],  [place], [place] [address]    Report Status 06/05/2017 FINAL  Final  Urine culture     Status: Abnormal   Collection Time: 05/31/17 11:06 PM  Result Value Ref Range Status   Specimen Description URINE, CLEAN CATCH  Final   Special Requests NONE  Final   Culture >=100,000 COLONIES/mL ESCHERICHIA COLI (A)  Final   Report Status 06/03/2017 FINAL  Final   Organism ID, Bacteria ESCHERICHIA COLI (A)  Final      Susceptibility   Escherichia coli - MIC*    AMPICILLIN 4 SENSITIVE Sensitive     CEFAZOLIN <=4 SENSITIVE Sensitive     CEFTRIAXONE <=1 SENSITIVE Sensitive     CIPROFLOXACIN <=0.25 SENSITIVE Sensitive     GENTAMICIN <=1 SENSITIVE Sensitive     IMIPENEM <=0.25 SENSITIVE Sensitive     NITROFURANTOIN <=16 SENSITIVE Sensitive     TRIMETH/SULFA <=20 SENSITIVE Sensitive     AMPICILLIN/SULBACTAM 8 SENSITIVE Sensitive  PIP/TAZO <=4 SENSITIVE Sensitive     Extended ESBL NEGATIVE Sensitive     * >=100,000 COLONIES/mL ESCHERICHIA COLI  Culture, blood (routine x 2)     Status: Abnormal (Preliminary result)   Collection Time: 06/02/17  5:22 PM  Result Value Ref Range Status   Specimen Description   Final    BLOOD BLOOD LEFT HAND Performed at [facility] [address] [place], [place] [address]    Special Requests   Final    BOTTLES DRAWN AEROBIC AND ANAEROBIC Blood Culture adequate volume Performed at [facility] [address], [place], [place] [address]    Culture  Setup Time   Final    GRAM POSITIVE COCCI IN BOTH AEROBIC AND ANAEROBIC BOTTLES CRITICAL RESULT CALLED TO, READ BACK BY AND VERIFIED WITH: J GRIMSLEY [facility] 06/04/17 0308 JDW    Culture (A)  Final    STAPHYLOCOCCUS SPECIES (COAGULASE NEGATIVE) THE SIGNIFICANCE OF ISOLATING THIS ORGANISM FROM A SINGLE SET OF BLOOD CULTURES WHEN MULTIPLE SETS ARE DRAWN IS UNCERTAIN. PLEASE NOTIFY THE MICROBIOLOGY DEPARTMENT WITHIN ONE WEEK IF SPECIATION AND SENSITIVITIES ARE REQUIRED. Performed at  [facility], [place] [address], [place], [place] [address]    Report Status PENDING  Incomplete  Blood Culture ID Panel (Reflexed)     Status: Abnormal   Collection Time: 06/02/17  5:22 PM  Result Value Ref Range Status   Enterococcus species NOT DETECTED NOT DETECTED Final   Listeria monocytogenes NOT DETECTED NOT DETECTED Final   Staphylococcus species DETECTED (A) NOT DETECTED Final    Comment: Methicillin (oxacillin) resistant coagulase negative staphylococcus. Possible blood culture contaminant (unless isolated from more than one blood culture draw or clinical case suggests pathogenicity). No antibiotic treatment is indicated for blood  culture contaminants. CRITICAL RESULT CALLED TO, READ BACK BY AND VERIFIED WITH: J GRIMSLEY PHARMD 06/04/17 0308 JDW    Staphylococcus aureus NOT DETECTED NOT DETECTED Final   Methicillin resistance DETECTED (A) NOT DETECTED Final    Comment: CRITICAL RESULT CALLED TO, READ BACK BY AND VERIFIED WITH: J GRIMSLEY PHARMD 06/04/17 0308 JDW    Streptococcus species NOT DETECTED NOT DETECTED Final   Streptococcus agalactiae NOT DETECTED NOT DETECTED Final   Streptococcus pneumoniae NOT DETECTED NOT DETECTED Final   Streptococcus pyogenes NOT DETECTED NOT DETECTED Final   Acinetobacter baumannii NOT DETECTED NOT DETECTED Final   Enterobacteriaceae species NOT DETECTED NOT DETECTED Final   Enterobacter cloacae complex NOT DETECTED NOT DETECTED Final   Escherichia coli NOT DETECTED NOT DETECTED Final   Klebsiella oxytoca NOT DETECTED NOT DETECTED Final   Klebsiella pneumoniae NOT DETECTED NOT DETECTED Final   Proteus species NOT DETECTED NOT DETECTED Final   Serratia marcescens NOT DETECTED NOT DETECTED Final   Haemophilus influenzae NOT DETECTED NOT DETECTED Final   Neisseria meningitidis NOT DETECTED NOT DETECTED Final   Pseudomonas aeruginosa NOT DETECTED NOT DETECTED Final   Candida albicans NOT DETECTED NOT DETECTED Final   Candida glabrata NOT  DETECTED NOT DETECTED Final   Candida krusei NOT DETECTED NOT DETECTED Final   Candida parapsilosis NOT DETECTED NOT DETECTED Final   Candida tropicalis NOT DETECTED NOT DETECTED Final    Comment: Performed at [facility], [place]. [address] [place], [place] [address]  Culture, blood (routine x 2)     Status: None (Preliminary result)   Collection Time: 06/02/17  5:23 PM  Result Value Ref Range Status   Specimen Description   Final  BLOOD BLOOD RIGHT HAND Performed at [facility] [address] [place], [place] [address]    Special Requests   Final    BOTTLES DRAWN AEROBIC AND ANAEROBIC Blood Culture adequate volume Performed at [facility] [address] [place], [place] [address]    Culture   Final    NO GROWTH 4 DAYS Performed at [facility], [place] [address] [place], [place] [address]    Report Status PENDING  Incomplete  Culture, blood (Routine X 2) w Reflex to ID Panel     Status: None (Preliminary result)   Collection Time: 06/05/17 12:21 AM  Result Value Ref Range Status   Specimen Description   Final    BLOOD LEFT ARM Performed at [facility] [address] [place], [place] [address]    Special Requests   Final    BOTTLES DRAWN AEROBIC AND ANAEROBIC Blood Culture adequate volume Performed at [facility] [address], [place], [place] [address]    Culture   Final    NO GROWTH 1 DAY Performed at [facility], [place] [address] [place], [place] [address]    Report Status PENDING  Incomplete  Culture, blood (Routine X 2) w Reflex to ID Panel     Status: None (Preliminary result)   Collection Time: 06/05/17 12:21 AM  Result Value Ref Range Status   Specimen Description   Final    BLOOD LEFT HAND Performed at [facility] [address] [place], [place] [address]    Special Requests   Final    BOTTLES DRAWN AEROBIC AND ANAEROBIC Blood Culture  adequate volume Performed at [facility] [address] [place], [place] [address]    Culture   Final    NO GROWTH 1 DAY Performed at [facility], [place] [address], [place], [place] [address]    Report Status PENDING  Incomplete    FURTHER DISCHARGE INSTRUCTIONS:  Get Medicines reviewed and adjusted: Please take all your medications with you for your next visit with your Primary MD  Laboratory/radiological data: Please request your Primary MD to go over all hospital tests and procedure/radiological results at the follow up, please ask your Primary MD to get all Hospital records sent to his/her office.  In some cases, they will be blood work, cultures and biopsy results pending at the time of your discharge. Please request that your primary care [name] goes through all the records of your hospital data and follows up on these results.  Also Note the following: If you experience worsening of your admission symptoms, develop shortness of breath, life threatening emergency, suicidal or homicidal thoughts you must seek medical attention immediately by calling 911 or calling your MD immediately  if symptoms less severe.  You must read complete instructions/literature along with all the possible adverse reactions/side effects for all the Medicines you take and that have been prescribed to you. Take any new Medicines after you have completely understood and accpet all the possible adverse reactions/side effects.   Do not drive when taking Pain medications or sleeping medications (Benzodaizepines)  Do not take more than prescribed Pain, Sleep and Anxiety Medications. It is not advisable to combine anxiety,sleep and pain medications without talking with your primary care practitioner  Special Instructions: If you have smoked or chewed Tobacco  in the last 2 yrs please stop smoking, stop any regular Alcohol  and or any Recreational drug  use.  Wear Seat belts while  driving.  Please note: You were cared for by a hospitalist during your hospital stay. Once you are discharged, your primary care physician will handle any further medical issues. Please note that NO REFILLS for any discharge medications will be authorized once you are discharged, as it is imperative that you return to your primary care physician (or establish a relationship with a primary care physician if you do not have one) for your post hospital discharge needs so that they can reassess your need for medications and monitor your lab values.  Total Time spent coordinating discharge including counseling, education and face to face time equals 45 minutes.  Signed: Aleyah Balik 06/06/2017 10:08 AM

## 2017-06-07 DIAGNOSIS — F0150 Vascular dementia without behavioral disturbance: Secondary | ICD-10-CM | POA: Diagnosis present

## 2017-06-07 DIAGNOSIS — J9601 Acute respiratory failure with hypoxia: Secondary | ICD-10-CM | POA: Diagnosis present

## 2017-06-07 DIAGNOSIS — R569 Unspecified convulsions: Secondary | ICD-10-CM | POA: Diagnosis not present

## 2017-06-07 DIAGNOSIS — F0390 Unspecified dementia without behavioral disturbance: Secondary | ICD-10-CM | POA: Diagnosis not present

## 2017-06-07 DIAGNOSIS — M6281 Muscle weakness (generalized): Secondary | ICD-10-CM | POA: Diagnosis present

## 2017-06-07 DIAGNOSIS — J9600 Acute respiratory failure, unspecified whether with hypoxia or hypercapnia: Secondary | ICD-10-CM | POA: Diagnosis not present

## 2017-06-07 DIAGNOSIS — F482 Pseudobulbar affect: Secondary | ICD-10-CM | POA: Diagnosis not present

## 2017-06-07 DIAGNOSIS — G309 Alzheimer's disease, unspecified: Secondary | ICD-10-CM | POA: Diagnosis present

## 2017-06-07 DIAGNOSIS — G301 Alzheimer's disease with late onset: Secondary | ICD-10-CM | POA: Diagnosis not present

## 2017-06-07 DIAGNOSIS — R1312 Dysphagia, oropharyngeal phase: Secondary | ICD-10-CM | POA: Diagnosis present

## 2017-06-07 DIAGNOSIS — R4182 Altered mental status, unspecified: Secondary | ICD-10-CM | POA: Diagnosis not present

## 2017-06-07 DIAGNOSIS — Z8673 Personal history of transient ischemic attack (TIA), and cerebral infarction without residual deficits: Secondary | ICD-10-CM | POA: Diagnosis not present

## 2017-06-07 DIAGNOSIS — I10 Essential (primary) hypertension: Secondary | ICD-10-CM | POA: Diagnosis not present

## 2017-06-07 DIAGNOSIS — Q231 Congenital insufficiency of aortic valve: Secondary | ICD-10-CM | POA: Diagnosis not present

## 2017-06-07 DIAGNOSIS — F0280 Dementia in other diseases classified elsewhere without behavioral disturbance: Secondary | ICD-10-CM | POA: Diagnosis not present

## 2017-06-07 LAB — CULTURE, BLOOD (ROUTINE X 2)
CULTURE: NO GROWTH
Special Requests: ADEQUATE

## 2017-06-07 NOTE — Progress Notes (Signed)
Assessment unchanged. Pt prepared to be dc'd to [facility]. Discharged via stretcher accompanied by [facility] staff x 2. Packet given to [facility] paramedic to present at facility. Report called and given to [facility], LPN. Wife notified of pt's transport to [facility].

## 2017-06-07 NOTE — Progress Notes (Addendum)
CSW informed pt has been discharged 2/2- long term care resident at [facility]. Sent DC summary to facility via the [facility] and left message for facility to coordinate return. Spoke with wife via phone- she is aware and agreeable to plan to return to [facility] today. Will arrange [facility] transportation once hearing back from facility that they are prepared for pt's return today.  Sharren Bridge, MSW, LCSW Clinical Social Work 06/07/2017 [phone]  10:40- spoke with admissions- facility prepared for pt's return. Report [phone], room 107-A Will arrange [facility] transport

## 2017-06-07 NOTE — Progress Notes (Signed)
[facility]                                                                                                                                                                         Patient Demographics  Mike Williams, is a 84 y.o. male  XHB:[id number]  YBO:[id number]  DOB - Nov 11, 1932  Admit date - 06/02/2017  Admitting Physician Adam Sharene Butters, MD  Outpatient Primary MD for the patient is [name], DO  LOS - 4   No chief complaint on file.       Assessment & Plan    Patient seen briefly today due for discharge soon per Discharge done yesterday by Dr Tera Helper, no further issues, Vital signs stable, patient in no distress.      Medications  Scheduled Meds: . clopidogrel  75 mg Oral Daily  . enoxaparin (LOVENOX) injection  40 mg Subcutaneous QHS  . escitalopram  10 mg Oral Daily  . felodipine  10 mg Oral Daily  . hydrALAZINE  100 mg Oral Q8H  . irbesartan  300 mg Oral Daily  . lubriderm seriously sensitive  1 application Topical BID   Continuous Infusions: .  ceFAZolin (ANCEF) IV Stopped (06/07/17 0631)  . levETIRAcetam 750 mg (06/07/17 0918)   PRN Meds:.acetaminophen **OR** acetaminophen, hydrALAZINE    Time Spent in minutes   10 minutes   Lala Lund M.D on 06/07/2017 at 10:54 AM  Between 7am to 7pm - Pager - [phone]  After 7pm go to [email or web address] - password [email or web address]  And look for the night coverage person covering for me after hours  [facility] Office  [phone]    Subjective:   Mike Williams today in bed, in no distress, remains non verbal.  Objective:   Vitals:   06/06/17 0900 06/06/17 1429 06/06/17 2307 06/07/17 0705  BP:  (!) 164/84 (!) 186/85 (!) 172/88  Pulse:  (!) 58 60 70  Resp:  16 16 (!) 6  Temp:  98.6 F (37 C) 99.3 F (37.4 C) 98 F (36.7 C)  TempSrc:  Oral Oral Oral  SpO2:  98% 100% 100%  Weight: 65.8 kg (145 lb)     Height: 6'  (1.829 m)       Wt Readings from Last 3 Encounters:  06/06/17 65.8 kg (145 lb)  05/31/17 65.8 kg (145 lb)  05/12/17 66 kg (145 lb 9.6 oz)     Intake/Output Summary (Last 24 hours) at 06/07/2017 1054 Last data filed at 06/07/2017 1000 Gross per 24 hour  Intake 797.5 ml  Output 1875 ml  Net -1077.5 ml    Exam Awake Non verbal, [place].AT,PERRAL Supple Neck,No JVD, No cervical lymphadenopathy appriciated.  Symmetrical Chest wall movement, Good air movement bilaterally,  CTAB RRR,No Gallops,Rubs or new Murmurs, No Parasternal Heave +ve B.Sounds, Abd Soft, Non tender, No organomegaly appriciated, No rebound - guarding or rigidity. No Cyanosis, Clubbing or edema, No new Rash or bruise      Data Review

## 2017-06-08 ENCOUNTER — Telehealth: Payer: Self-pay

## 2017-06-08 ENCOUNTER — Encounter: Payer: Self-pay | Admitting: Internal Medicine

## 2017-06-08 ENCOUNTER — Non-Acute Institutional Stay (SKILLED_NURSING_FACILITY): Payer: Medicare Other | Admitting: Internal Medicine

## 2017-06-08 DIAGNOSIS — Z8673 Personal history of transient ischemic attack (TIA), and cerebral infarction without residual deficits: Secondary | ICD-10-CM | POA: Diagnosis not present

## 2017-06-08 DIAGNOSIS — R569 Unspecified convulsions: Secondary | ICD-10-CM

## 2017-06-08 DIAGNOSIS — G301 Alzheimer's disease with late onset: Secondary | ICD-10-CM

## 2017-06-08 DIAGNOSIS — F0280 Dementia in other diseases classified elsewhere without behavioral disturbance: Secondary | ICD-10-CM | POA: Diagnosis not present

## 2017-06-08 DIAGNOSIS — F0390 Unspecified dementia without behavioral disturbance: Secondary | ICD-10-CM | POA: Diagnosis not present

## 2017-06-08 DIAGNOSIS — F482 Pseudobulbar affect: Secondary | ICD-10-CM

## 2017-06-08 DIAGNOSIS — I10 Essential (primary) hypertension: Secondary | ICD-10-CM | POA: Diagnosis not present

## 2017-06-08 NOTE — Telephone Encounter (Signed)
Possible re-admission to facility. This is a patient you were seeing at [facility] . [facility] F/U is needed if patient was re-admitted to facility upon discharge. Hospital discharge from [facility] on 06/07/17.

## 2017-06-08 NOTE — Progress Notes (Signed)
Patient ID: Mike Williams, male   DOB: 1932-07-13, 84 y.o.   MRN: [id number]  Provider:  DR Arletha Grippe Location:  [place] of Service:  SNF (31)  PCP: [name], DO Patient Care Team: [name], DO as PCP - General (Internal Medicine)  Extended Emergency Contact Information Primary Emergency Contact: Badon,[name] Address: [address]          [place], [place] [address] Johnnette Litter of [place] Phone: [phone] Relation: Spouse  Code Status: full code Goals of Care: Advanced Directive information Advanced Directives 06/02/2017  Does Patient Have a Medical Advance Directive? No  Type of Advance Directive -  Does patient want to make changes to medical advance directive? No - Patient declined  Copy of [place] in Chart? -  Would patient like information on creating a medical advance directive? No - Patient declined  Pre-existing out of facility DNR order (yellow form or pink MOST form) -      Chief Complaint  Patient presents with  . Readmit To SNF    from hospital 06/02/17-06/07/17    HPI: Patient is a 84 y.o. male seen today for readmission to SNF following hospital stay for acute hypoxic respiratory, coag neg bacteremia felt to be contaminant, E coli UTI failure 2/2 influenza and possible aspiration pneumonitis, acute metabolic encephalopathy, vascular dementia. He completed Tamiflu tx. He wad placed on O2 but was off at d/c. IV abx tx UTI --> po keflex. He was seen by palliative care. Prognosis is poor overall.  Today he reports no concerns. He is nonverbal. He is a poor hsitorian due to dementia. Hx obtained from chart.  PBA - stable on nuedexta 20-10 mg twice daily  Depression - mood stable on lexapro 10 mg daily (GDR done 04-01-17)  Hx prostate cancer - s/p cryotherapy (06/2014); followed by [facility]   HTN/AI - BP stable on benicar 40 mg daily; plendil 10 mg daily; apresoline  100 mg three times  daily   Seizure d/o - stable on keppra to 500 mg twice daily    Hx CVA - stable on plavix 75 mg daily   Alzheimer's dementia - end stage. He does not take any medications. Weight stable. He completed remeron therapy. Albumin 3.4  Past Medical History:  Diagnosis Date  . Aortic insufficiency 07/04/2011   2/13 Study Conclusions  - Left ventricle: Systolic function was normal. The estimated ejection fraction was in the range of 55% to 60%. - Aortic valve: Moderate regurgitation. - Aorta: The aorta was moderately dilated. Transthoracic echocardiography. M-mode, complete 2D, spectral Doppler, and color Doppler. Height: Height: 170.2cm. Height: 67in. Weight: Weight: 84.1kg. Weight: 185lb. Body mass index: BMI: 29kg/m^2. Body surface area: BSA: 2.51m^2. Blood pressure: 175/86. Patient status: Inpatient. Location: Bedside.      . Aortic root dilatation (HCC)    Noted on 09/2010 echo, with mild-mod AI  . CEREBROVASCULAR ACCIDENT, HX OF 02/09/2007  . DEMENTIA 05/12/2007  . Hemorrhoids   . HYPERGLYCEMIA 09/21/2009  . HYPERTENSION 12/29/2006  . Lactose intolerance   . OBSTRUCTIVE SLEEP APNEA 12/29/2006  . PROSTATE CANCER, HX OF 12/29/2006  . Prostatitis   . Risk for falls   . Seizures ([place])    Past Surgical History:  Procedure Laterality Date  . CHOLECYSTECTOMY    . GANGLION CYST EXCISION    . PROSTATE SURGERY     cryotherapy    reports that  has never smoked. he has never used smokeless tobacco.  He reports that he does not drink alcohol or use drugs. Social History   Socioeconomic History  . Marital status: Married    Spouse name: Not on file  . Number of children: 2  . Years of education: master's  . Highest education level: Not on file  Social Needs  . Financial resource strain: Not on file  . Food insecurity - worry: Not on file  . Food insecurity - inability: Not on file  . Transportation needs - medical: Not on file  . Transportation needs - non-medical: Not on file  Occupational  History  . Occupation: retired  Tobacco Use  . Smoking status: Never Smoker  . Smokeless tobacco: Never Used  Substance and Sexual Activity  . Alcohol use: No  . Drug use: No  . Sexual activity: Not on file  Other Topics Concern  . 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       Patient does not drink caffeine.   Patient is right handed.    Functional Status Survey:    Family History  Problem Relation Age of Onset  . Heart attack Father   . Heart disease Father   . Cancer Sister     Health Maintenance  Topic Date Due  . TETANUS/TDAP  07/22/2017 (Originally 09/22/2008)  . INFLUENZA VACCINE  12/12/2017 (Originally 12/03/2016)  . PNA vac Low Risk Adult  Completed    No Known Allergies  Outpatient Encounter Medications as of 06/08/2017  Medication Sig  . acetaminophen (TYLENOL) 500 MG tablet Take 2 tablets (1,000 mg total) by mouth every 6 (six) hours as needed. (Patient taking differently: Take 1,000 mg by mouth every 6 (six) hours as needed for mild pain. )  . betamethasone dipropionate (DIPROLENE) 0.05 % cream Apply 1 application topically daily. Apply to Groin area  . cephALEXin (KEFLEX) 250 MG/5ML suspension Take 10 mLs (500 mg total) by mouth 2 (two) times daily for 3 days.  . clopidogrel (PLAVIX) 75 MG tablet take 1 tablet by mouth once daily  . Dextromethorphan-Quinidine (NUEDEXTA) 20-10 MG CAPS Take 1 capsule by mouth 2 (two) times daily.  . Emollient (EUCERIN) lotion Apply to Dry skin topically two times daily  . escitalopram (LEXAPRO) 5 MG tablet Take 10 mg by mouth daily. For depression   . felodipine (PLENDIL) 10 MG 24 hr tablet Take 10 mg by mouth daily.  Marland Kitchen HYDRALAZINE HCL PO Give 100 mg by mouth three times a day for Prophylaxis  . levETIRAcetam (KEPPRA) 750 MG tablet Take 750 mg by mouth 2 (two) times daily.  Marland Kitchen NUTRITIONAL SUPPLEMENT LIQD House supplement - Med Pass - Give 120 cc by mouth three times daily Nutritional  treat - Give 4 ounces by mouth three times daily  . Nutritional Supplements (NUTRITIONAL SUPPLEMENT PO) HSG NAS Diet - HSG Mech Soft Texture, Regular consistency  . olmesartan (BENICAR) 40 MG tablet Take 40 mg by mouth daily.  . ondansetron (ZOFRAN ODT) 4 MG disintegrating tablet Take 1 tablet (4 mg total) by mouth every 8 (eight) hours as needed for nausea or vomiting.  Marland Kitchen oseltamivir (TAMIFLU) 6 MG/ML SUSR suspension Take 12.5 mLs (75 mg total) by mouth 2 (two) times daily. 2 more doses and then stop   No facility-administered encounter medications on file as of 06/08/2017.     Review of Systems  Unable to perform ROS: Dementia    Vitals:   06/08/17 1231  BP: (!) 160/70  Pulse: 61  Temp: (!) 97.2 F (36.2 C)  SpO2: 98%   There is no height or weight on file to calculate BMI. Physical Exam  Constitutional: He is oriented to person, place, and time. He appears well-developed and well-nourished.  Frail appearing in NAD, lying in bed  HENT:  Mouth/Throat: Oropharynx is clear and moist.  MMM; no oral thrush  Eyes: Pupils are equal, round, and reactive to light. No scleral icterus.  Neck: Neck supple. Carotid bruit is not present. No thyromegaly present.  Cardiovascular: Normal rate, regular rhythm and intact distal pulses. Exam reveals no gallop and no friction rub.  Murmur (1/6 SEM) heard. No distal LE edema. No calf TTP  Pulmonary/Chest: Effort normal and breath sounds normal. No respiratory distress. He has no wheezes. He has no rales. He exhibits no tenderness.  Abdominal: Soft. Normal appearance and bowel sounds are normal. He exhibits no distension, no abdominal bruit, no pulsatile midline mass and no mass. There is no hepatomegaly. There is no tenderness. There is no rigidity, no rebound and no guarding. No hernia.  Musculoskeletal: He exhibits edema.  Lymphadenopathy:    He has no cervical adenopathy.  Neurological: He is alert and oriented to person, place, and time.  Skin:  Skin is warm and dry. No rash noted.  Dry skin on plantar surface  Psychiatric: He has a normal mood and affect. His behavior is normal.    Labs reviewed: Basic Metabolic Panel: Recent Labs    06/02/17 1655 06/02/17 1815 06/03/17 0421 06/06/17 0532  NA 139  --  141 139  K 3.8  --  3.5 3.4*  CL 104  --  107 105  CO2 28  --  29 28  GLUCOSE 112*  --  95 94  BUN 20  --  20 16  CREATININE 0.94  --  0.83 0.85  CALCIUM 8.9  --  8.5* 8.6*  MG  --  1.9  --   --   PHOS  --  3.2  --   --    Liver Function Tests: Recent Labs    04/14/17 05/31/17 2159 06/02/17 1655  AST 15 36 26  ALT 15 27 21   ALKPHOS 125 114 97  BILITOT  --  0.6 0.5  PROT  --  7.5 7.1  ALBUMIN  --  3.4* 3.2*   No results for input(s): LIPASE, AMYLASE in the last 8760 hours. No results for input(s): AMMONIA in the last 8760 hours. CBC: Recent Labs    04/14/17  05/31/17 2159 06/02/17 1655 06/03/17 0421 06/06/17 0532  WBC 6.3   < > 9.0 7.2 5.6 7.4  NEUTROABS 4  --  7.2 4.4  --   --   HGB 12.6*  --  14.3 13.4 11.8* 11.4*  HCT 38*  --  43.2 40.2 36.0* 33.2*  MCV  --    < > 87.1 86.6 85.9 84.7  PLT 229  --  124* 162 163 170   < > = values in this interval not displayed.   Cardiac Enzymes: Recent Labs    06/02/17 1655  TROPONINI <0.03   BNP: Invalid input(s): POCBNP Lab Results  Component Value Date   HGBA1C 5.6 04/14/2017   Lab Results  Component Value Date   TSH 1.804 09/23/2014   No results found for: VITAMINB12 No results found for: FOLATE No results found for: IRON, TIBC, FERRITIN  Imaging and Procedures obtained prior to SNF admission: Dg Chest Port 1 View  Result Date: 06/02/2017 CLINICAL DATA:  Shortness of Breath EXAM: PORTABLE CHEST 1 VIEW COMPARISON:  05/31/2017 FINDINGS: Cardiomegaly. Tortuous, ectatic thoracic aorta. Bibasilar atelectasis. No effusions or acute bony abnormality. IMPRESSION: Cardiomegaly.  Bibasilar atelectasis. Tortuous, ectatic aorta. Electronically Signed   By:  Rolm Baptise [name]   On: 06/02/2017 18:12    Assessment/Plan   ICD-10-CM   1. Late onset Alzheimer's disease without behavioral disturbance G30.1    F02.80   2. Essential hypertension I10   3. PBA (pseudobulbar affect) F48.2   4. Seizures ([place]) R56.9   5. History of CVA (cerebrovascular accident) Z52.73     Refer to hospice due to end stage dementia  PT/OT/ST as ordered  D/c droplet precaution as he is no longer infectious  Cont current meds as ordered  F/u with specialists as scheduled  GOAL: short term rehab then continue long term care. Communicated with pt and nursing.  Will follow  Labs/tests ordered: none   Jocelyn Nold S. Perlie Gold  [facility] [address] [place], [place] [address] [phone] Cell (Monday-Friday 8 AM - 5 PM) [phone] After 5 PM and follow prompts

## 2017-06-10 DIAGNOSIS — F0150 Vascular dementia without behavioral disturbance: Secondary | ICD-10-CM | POA: Diagnosis not present

## 2017-06-10 DIAGNOSIS — I10 Essential (primary) hypertension: Secondary | ICD-10-CM | POA: Diagnosis not present

## 2017-06-10 DIAGNOSIS — I679 Cerebrovascular disease, unspecified: Secondary | ICD-10-CM | POA: Diagnosis not present

## 2017-06-10 DIAGNOSIS — R569 Unspecified convulsions: Secondary | ICD-10-CM | POA: Diagnosis not present

## 2017-06-10 DIAGNOSIS — G309 Alzheimer's disease, unspecified: Secondary | ICD-10-CM | POA: Diagnosis not present

## 2017-06-10 DIAGNOSIS — I350 Nonrheumatic aortic (valve) stenosis: Secondary | ICD-10-CM | POA: Diagnosis not present

## 2017-06-10 DIAGNOSIS — F339 Major depressive disorder, recurrent, unspecified: Secondary | ICD-10-CM | POA: Diagnosis not present

## 2017-06-10 DIAGNOSIS — R1310 Dysphagia, unspecified: Secondary | ICD-10-CM | POA: Diagnosis not present

## 2017-06-10 LAB — CULTURE, BLOOD (ROUTINE X 2)
CULTURE: NO GROWTH
Culture: NO GROWTH
SPECIAL REQUESTS: ADEQUATE
Special Requests: ADEQUATE

## 2017-06-12 DIAGNOSIS — I679 Cerebrovascular disease, unspecified: Secondary | ICD-10-CM | POA: Diagnosis not present

## 2017-06-12 DIAGNOSIS — F0150 Vascular dementia without behavioral disturbance: Secondary | ICD-10-CM | POA: Diagnosis not present

## 2017-06-12 DIAGNOSIS — R569 Unspecified convulsions: Secondary | ICD-10-CM | POA: Diagnosis not present

## 2017-06-12 DIAGNOSIS — G309 Alzheimer's disease, unspecified: Secondary | ICD-10-CM | POA: Diagnosis not present

## 2017-06-12 DIAGNOSIS — I10 Essential (primary) hypertension: Secondary | ICD-10-CM | POA: Diagnosis not present

## 2017-06-12 DIAGNOSIS — I350 Nonrheumatic aortic (valve) stenosis: Secondary | ICD-10-CM | POA: Diagnosis not present

## 2017-06-16 DIAGNOSIS — R569 Unspecified convulsions: Secondary | ICD-10-CM | POA: Diagnosis not present

## 2017-06-16 DIAGNOSIS — G309 Alzheimer's disease, unspecified: Secondary | ICD-10-CM | POA: Diagnosis not present

## 2017-06-16 DIAGNOSIS — I10 Essential (primary) hypertension: Secondary | ICD-10-CM | POA: Diagnosis not present

## 2017-06-16 DIAGNOSIS — I679 Cerebrovascular disease, unspecified: Secondary | ICD-10-CM | POA: Diagnosis not present

## 2017-06-16 DIAGNOSIS — I350 Nonrheumatic aortic (valve) stenosis: Secondary | ICD-10-CM | POA: Diagnosis not present

## 2017-06-16 DIAGNOSIS — F0150 Vascular dementia without behavioral disturbance: Secondary | ICD-10-CM | POA: Diagnosis not present

## 2017-06-18 DIAGNOSIS — I350 Nonrheumatic aortic (valve) stenosis: Secondary | ICD-10-CM | POA: Diagnosis not present

## 2017-06-18 DIAGNOSIS — F0150 Vascular dementia without behavioral disturbance: Secondary | ICD-10-CM | POA: Diagnosis not present

## 2017-06-18 DIAGNOSIS — I10 Essential (primary) hypertension: Secondary | ICD-10-CM | POA: Diagnosis not present

## 2017-06-18 DIAGNOSIS — G309 Alzheimer's disease, unspecified: Secondary | ICD-10-CM | POA: Diagnosis not present

## 2017-06-18 DIAGNOSIS — I679 Cerebrovascular disease, unspecified: Secondary | ICD-10-CM | POA: Diagnosis not present

## 2017-06-18 DIAGNOSIS — R569 Unspecified convulsions: Secondary | ICD-10-CM | POA: Diagnosis not present

## 2017-06-23 DIAGNOSIS — F0150 Vascular dementia without behavioral disturbance: Secondary | ICD-10-CM | POA: Diagnosis not present

## 2017-06-23 DIAGNOSIS — I10 Essential (primary) hypertension: Secondary | ICD-10-CM | POA: Diagnosis not present

## 2017-06-23 DIAGNOSIS — G309 Alzheimer's disease, unspecified: Secondary | ICD-10-CM | POA: Diagnosis not present

## 2017-06-23 DIAGNOSIS — I679 Cerebrovascular disease, unspecified: Secondary | ICD-10-CM | POA: Diagnosis not present

## 2017-06-23 DIAGNOSIS — R569 Unspecified convulsions: Secondary | ICD-10-CM | POA: Diagnosis not present

## 2017-06-23 DIAGNOSIS — I350 Nonrheumatic aortic (valve) stenosis: Secondary | ICD-10-CM | POA: Diagnosis not present

## 2017-06-29 DIAGNOSIS — I679 Cerebrovascular disease, unspecified: Secondary | ICD-10-CM | POA: Diagnosis not present

## 2017-06-29 DIAGNOSIS — R569 Unspecified convulsions: Secondary | ICD-10-CM | POA: Diagnosis not present

## 2017-06-29 DIAGNOSIS — I10 Essential (primary) hypertension: Secondary | ICD-10-CM | POA: Diagnosis not present

## 2017-06-29 DIAGNOSIS — F0150 Vascular dementia without behavioral disturbance: Secondary | ICD-10-CM | POA: Diagnosis not present

## 2017-06-29 DIAGNOSIS — I350 Nonrheumatic aortic (valve) stenosis: Secondary | ICD-10-CM | POA: Diagnosis not present

## 2017-06-29 DIAGNOSIS — G309 Alzheimer's disease, unspecified: Secondary | ICD-10-CM | POA: Diagnosis not present

## 2017-07-03 ENCOUNTER — Non-Acute Institutional Stay (SKILLED_NURSING_FACILITY): Payer: Medicare Other | Admitting: Adult Health

## 2017-07-03 DIAGNOSIS — D649 Anemia, unspecified: Secondary | ICD-10-CM

## 2017-07-03 DIAGNOSIS — G301 Alzheimer's disease with late onset: Secondary | ICD-10-CM

## 2017-07-03 DIAGNOSIS — F0280 Dementia in other diseases classified elsewhere without behavioral disturbance: Secondary | ICD-10-CM | POA: Diagnosis not present

## 2017-07-03 DIAGNOSIS — F329 Major depressive disorder, single episode, unspecified: Secondary | ICD-10-CM

## 2017-07-03 DIAGNOSIS — I639 Cerebral infarction, unspecified: Secondary | ICD-10-CM | POA: Diagnosis not present

## 2017-07-03 DIAGNOSIS — E43 Unspecified severe protein-calorie malnutrition: Secondary | ICD-10-CM | POA: Diagnosis not present

## 2017-07-03 DIAGNOSIS — I679 Cerebrovascular disease, unspecified: Secondary | ICD-10-CM | POA: Diagnosis not present

## 2017-07-03 DIAGNOSIS — I10 Essential (primary) hypertension: Secondary | ICD-10-CM | POA: Diagnosis not present

## 2017-07-03 DIAGNOSIS — F0150 Vascular dementia without behavioral disturbance: Secondary | ICD-10-CM

## 2017-07-03 DIAGNOSIS — R569 Unspecified convulsions: Secondary | ICD-10-CM

## 2017-07-03 DIAGNOSIS — I350 Nonrheumatic aortic (valve) stenosis: Secondary | ICD-10-CM | POA: Diagnosis not present

## 2017-07-03 DIAGNOSIS — F32A Depression, unspecified: Secondary | ICD-10-CM

## 2017-07-03 DIAGNOSIS — F339 Major depressive disorder, recurrent, unspecified: Secondary | ICD-10-CM | POA: Diagnosis not present

## 2017-07-03 DIAGNOSIS — G309 Alzheimer's disease, unspecified: Secondary | ICD-10-CM | POA: Diagnosis not present

## 2017-07-03 DIAGNOSIS — F482 Pseudobulbar affect: Secondary | ICD-10-CM | POA: Diagnosis not present

## 2017-07-03 DIAGNOSIS — R1310 Dysphagia, unspecified: Secondary | ICD-10-CM | POA: Diagnosis not present

## 2017-07-04 ENCOUNTER — Encounter: Payer: Self-pay | Admitting: Adult Health

## 2017-07-04 NOTE — Progress Notes (Signed)
Provider:Special Ranes  Location  [facility]   PCP: [name], DO  Extended Emergency Contact Information Primary Emergency Contact: Finazzo,[name] Address: [address]          [place], [place] [address] Johnnette Litter of [place] Phone: [phone] Relation: Spouse  Codes status: DNR  Goals of care: advanced directive information Advanced Directives 06/02/2017  Does Patient Have a Medical Advance Directive? No  Type of Advance Directive -  Does patient want to make changes to medical advance directive? No - Patient declined  Copy of [place] in Chart? -  Would patient like information on creating a medical advance directive? No - Patient declined  Pre-existing out of facility DNR order (yellow form or pink MOST form) -     No Known Allergies  Chief Complaint  Patient presents with  . Annual Exam    HPI  He is an 84 year old long term resident of this facility being seen for his annual exam. He is now being followed by hospice care. He has been hospitalized in Jan of this year for acute hypoxic respiratory failure; influenza; pneumonia; uti and bacteremia. He is unable to participate in the hpi or ros. There are no reports of fevers; uncontrolled pain; or seizure activity. His nuedexta was stopped along with his plavix. There are no nursing concerns at this time.    Past Medical History:  Diagnosis Date  . Aortic insufficiency 07/04/2011   2/13 Study Conclusions  - Left ventricle: Systolic function was normal. The estimated ejection fraction was in the range of 55% to 60%. - Aortic valve: Moderate regurgitation. - Aorta: The aorta was moderately dilated. Transthoracic echocardiography. M-mode, complete 2D, spectral Doppler, and color Doppler. Height: Height: 170.2cm. Height: 67in. Weight: Weight: 84.1kg. Weight: 185lb. Body mass index: BMI: 29kg/m^2. Body surface area: BSA: 2.71m^2. Blood pressure: 175/86. Patient status: Inpatient.  Location: Bedside.      . Aortic root dilatation (HCC)    Noted on 09/2010 echo, with mild-mod AI  . CEREBROVASCULAR ACCIDENT, HX OF 02/09/2007  . DEMENTIA 05/12/2007  . Hemorrhoids   . HYPERGLYCEMIA 09/21/2009  . HYPERTENSION 12/29/2006  . Lactose intolerance   . OBSTRUCTIVE SLEEP APNEA 12/29/2006  . PROSTATE CANCER, HX OF 12/29/2006  . Prostatitis   . Risk for falls   . Seizures ([place])    Past Surgical History:  Procedure Laterality Date  . CHOLECYSTECTOMY    . GANGLION CYST EXCISION    . PROSTATE SURGERY     cryotherapy    reports that  has never smoked. he has never used smokeless tobacco. He reports that he does not drink alcohol or use drugs. Social History   Tobacco Use  . Smoking status: Never Smoker  . Smokeless tobacco: Never Used  Substance Use Topics  . Alcohol use: No  . Drug use: No   Family History  Problem Relation Age of Onset  . Heart attack Father   . Heart disease Father   . Cancer Sister     Pertinent  Health Maintenance Due  Topic Date Due  . INFLUENZA VACCINE  12/12/2017 (Originally 12/03/2016)  . PNA vac Low Risk Adult  Completed   Fall Risk  10/27/2016 06/29/2014 02/14/2014 02/01/2013  Falls in the past year? No No No No  Risk for fall due to : - - - Impaired mobility;Impaired balance/gait   Depression screen [facility] 2/9 10/27/2016 06/29/2014 02/01/2013  Decreased Interest 0 0 1  Down, Depressed, Hopeless 0 0  1  PHQ - 2 Score 0 0 2    Functional Status Survey:    Outpatient Encounter Medications as of 07/03/2017  Medication Sig  . acetaminophen (TYLENOL) 500 MG tablet Take 2 tablets (1,000 mg total) by mouth every 6 (six) hours as needed. (Patient taking differently: Take 1,000 mg by mouth every 6 (six) hours as needed for mild pain. )  . betamethasone dipropionate (DIPROLENE) 0.05 % cream Apply 1 application topically daily. Apply to Groin area  . Emollient (EUCERIN) lotion Apply to Dry skin topically two times daily  . escitalopram (LEXAPRO) 5 MG  tablet Take 10 mg by mouth daily. For depression   . felodipine (PLENDIL) 10 MG 24 hr tablet Take 10 mg by mouth daily.  Marland Kitchen HYDRALAZINE HCL PO Give 100 mg by mouth three times a day for Prophylaxis  . levETIRAcetam (KEPPRA) 750 MG tablet Take 750 mg by mouth 2 (two) times daily.  Marland Kitchen NUTRITIONAL SUPPLEMENT LIQD House supplement - Med Pass - Give 120 cc by mouth three times daily Nutritional treat - Give 4 ounces by mouth three times daily  . Nutritional Supplements (NUTRITIONAL SUPPLEMENT PO) HSG NAS Diet - HSG Mech Soft Texture, Regular consistency  . olmesartan (BENICAR) 40 MG tablet Take 40 mg by mouth daily.  . [DISCONTINUED] clopidogrel (PLAVIX) 75 MG tablet take 1 tablet by mouth once daily  . [DISCONTINUED] Dextromethorphan-Quinidine (NUEDEXTA) 20-10 MG CAPS Take 1 capsule by mouth 2 (two) times daily.  . [DISCONTINUED] ondansetron (ZOFRAN ODT) 4 MG disintegrating tablet Take 1 tablet (4 mg total) by mouth every 8 (eight) hours as needed for nausea or vomiting.  . [DISCONTINUED] oseltamivir (TAMIFLU) 6 MG/ML SUSR suspension Take 12.5 mLs (75 mg total) by mouth 2 (two) times daily. 2 more doses and then stop   No facility-administered encounter medications on file as of 07/03/2017.      Vitals:   07/03/17 2145  BP: 110/76  Pulse: 78  Resp: 18  Temp: 98.1 F (36.7 C)  SpO2: 95%  Weight: 144 lb 8 oz (65.5 kg)  Height: 5\' 7"  (1.702 m)   Body mass index is 22.63 kg/m.  DIAGNOSTIC EXAMS   TODAY:    05-31-17: chest x-ray: No acute cardiopulmonary process.   05-31-17: ct of head and spine: No acute intracranial abnormality. Stable cerebral atrophy, chronic small vessel disease, and old lacunar infarcts. Mild right frontal scalp hematoma.  No evidence of skull fracture. No evidence of acute cervical spine fracture or subluxation. Degenerative spondylosis, as described above.    LABS REVIEWED:   07-23-16: wbc 4.9; hgb 12.4; hct 36.8; mcv 89.;6 plt 181; glucose 81; bun 15.6; creat  0.77; k+ 4.0; na++ 143; ca 8.9; liver normal albumin 3.7  10-23-16: wbc 9.8; hgb 12.8; hct 37.1; mcv 87.6; plt 125; glucose 131; bun 19.5; creat 0.93; k+  3.6; na++ 150; ca 8.5  liver normal albumin 3.3 urine culture proteus mirabilis: rocephin   TODAY;   05-31-17: wbc 9.0; hgb 14.3; hct 43.2; mcv 87.1; plt 124; glucose 177; bun 12; creat 1.20; k+ 4.0; na+ 136; ca 8.9; liver normal albumin 3.4; blood culture: no growth; rapid flu: + A; urine culture: e-coli   06-03-17: wbc 5.6; hgb 11.8 hct 36.0; mv 85.9; plt 163; glucose 95; bun 20; creat 0.83; k+ 3.5; na++ 141; ca 8.5 06-06-17: wbc 7.4; hgb 11.4; hct 33.2; mcv 84.7; plt 170; glucose 94; bun 16; creat 0.85; k+ 3.4; na++ 139; ca 8.6    Review of Systems  Unable to perform ROS: Dementia (nonverbal )    Physical Exam  Constitutional: No distress.  Frail   HENT:  Mouth/Throat: Oropharynx is clear and moist.  Neck: No thyromegaly present.  Cardiovascular: Normal rate, regular rhythm and intact distal pulses.  Murmur heard. 1/6  Pulmonary/Chest: Effort normal and breath sounds normal. No respiratory distress.  Abdominal: Soft. Bowel sounds are normal. He exhibits no distension. There is no tenderness.  Musculoskeletal: He exhibits no edema.  Lymphadenopathy:    He has no cervical adenopathy.  Neurological:  Is aware   Skin: Skin is warm and dry. He is not diaphoretic.    ASSESSMENT/PLAN  TODAY:   1. CVA: is neurologically stable; will continue asa 81 mg daily   2. Essential hypertension: stable b/p 110/76: will continue benicar 40 mg daily felodinpine ER 10 gm daily hydralazine 100 mg three times daily will monitor   3. Vascular dementia without behavioral disturbance/Alzheimer's dementia; weight is 144.5 pounds; without change in status; is followed by hospice care; will monitor his status.   4.  PBA: without change in status; is now off nuedexta; is nonverbal   5. Depression no change in status; will continue lexapro 5 mg daily     6. Normocytic anemia: stable hgb 11.4 will monitor   7. Seizures: no reports of seizure activity present: will continue keppra 750 mg twice daily   8. Severe protein calorie malnutrition: weight is 144.5 pounds; albumin is 3.4; will continue supplements as directed will monitor   Time spent with patient and staff: 45 minutes: discussed overall status; patient needs; changes in status. Reviewing medical records.  His health maintenance is up to date

## 2017-07-07 DIAGNOSIS — I10 Essential (primary) hypertension: Secondary | ICD-10-CM | POA: Diagnosis not present

## 2017-07-07 DIAGNOSIS — R569 Unspecified convulsions: Secondary | ICD-10-CM | POA: Diagnosis not present

## 2017-07-07 DIAGNOSIS — I679 Cerebrovascular disease, unspecified: Secondary | ICD-10-CM | POA: Diagnosis not present

## 2017-07-07 DIAGNOSIS — G309 Alzheimer's disease, unspecified: Secondary | ICD-10-CM | POA: Diagnosis not present

## 2017-07-07 DIAGNOSIS — I350 Nonrheumatic aortic (valve) stenosis: Secondary | ICD-10-CM | POA: Diagnosis not present

## 2017-07-07 DIAGNOSIS — F0150 Vascular dementia without behavioral disturbance: Secondary | ICD-10-CM | POA: Diagnosis not present

## 2017-07-08 ENCOUNTER — Encounter: Payer: Self-pay | Admitting: Adult Health

## 2017-07-08 ENCOUNTER — Non-Acute Institutional Stay (SKILLED_NURSING_FACILITY): Payer: Medicare Other | Admitting: Adult Health

## 2017-07-08 DIAGNOSIS — G301 Alzheimer's disease with late onset: Secondary | ICD-10-CM | POA: Diagnosis not present

## 2017-07-08 DIAGNOSIS — F0280 Dementia in other diseases classified elsewhere without behavioral disturbance: Secondary | ICD-10-CM

## 2017-07-08 DIAGNOSIS — F0150 Vascular dementia without behavioral disturbance: Secondary | ICD-10-CM

## 2017-07-08 DIAGNOSIS — I679 Cerebrovascular disease, unspecified: Secondary | ICD-10-CM | POA: Diagnosis not present

## 2017-07-08 DIAGNOSIS — I350 Nonrheumatic aortic (valve) stenosis: Secondary | ICD-10-CM | POA: Diagnosis not present

## 2017-07-08 DIAGNOSIS — I10 Essential (primary) hypertension: Secondary | ICD-10-CM | POA: Diagnosis not present

## 2017-07-08 DIAGNOSIS — I639 Cerebral infarction, unspecified: Secondary | ICD-10-CM

## 2017-07-08 DIAGNOSIS — G309 Alzheimer's disease, unspecified: Secondary | ICD-10-CM | POA: Diagnosis not present

## 2017-07-08 DIAGNOSIS — R569 Unspecified convulsions: Secondary | ICD-10-CM | POA: Diagnosis not present

## 2017-07-08 NOTE — Progress Notes (Signed)
Location:   [facility] Room Number: 107 A Place of Service:  SNF (31)   CODE STATUS: DNR (Most form updated 07-08-17)  No Known Allergies  Chief Complaint  Patient presents with  . Acute Visit    Care Plan Meeting    HPI:  We have come together for his routine care plan meeting. His wife is present; he is present; but is unable to participate. He continues to be followed by hospice care. We did discuss his overall status; is medications. We did discuss the expectations of his care. There were no nursing concerns at this time. We did review his advanced directives and we have updated his MOST form. He is unable to participate in the hpi or ros. There are no reports of changes in appetite; no changes in behaviors; no reports of uncontrolled pain. There are no nursing concerns at this time.    Past Medical History:  Diagnosis Date  . Aortic insufficiency 07/04/2011   2/13 Study Conclusions  - Left ventricle: Systolic function was normal. The estimated ejection fraction was in the range of 55% to 60%. - Aortic valve: Moderate regurgitation. - Aorta: The aorta was moderately dilated. Transthoracic echocardiography. M-mode, complete 2D, spectral Doppler, and color Doppler. Height: Height: 170.2cm. Height: 67in. Weight: Weight: 84.1kg. Weight: 185lb. Body mass index: BMI: 29kg/m^2. Body surface area: BSA: 2.80m^2. Blood pressure: 175/86. Patient status: Inpatient. Location: Bedside.      . Aortic root dilatation (HCC)    Noted on 09/2010 echo, with mild-mod AI  . CEREBROVASCULAR ACCIDENT, HX OF 02/09/2007  . DEMENTIA 05/12/2007  . Hemorrhoids   . HYPERGLYCEMIA 09/21/2009  . HYPERTENSION 12/29/2006  . Lactose intolerance   . OBSTRUCTIVE SLEEP APNEA 12/29/2006  . PROSTATE CANCER, HX OF 12/29/2006  . Prostatitis   . Risk for falls   . Seizures ([place])     Past Surgical History:  Procedure Laterality Date  . CHOLECYSTECTOMY    . GANGLION CYST EXCISION    . PROSTATE SURGERY     cryotherapy    Social History   Socioeconomic History  . Marital status: Married    Spouse name: Not on file  . Number of children: 2  . Years of education: master's  . Highest education level: Not on file  Social Needs  . Financial resource strain: Not on file  . Food insecurity - worry: Not on file  . Food insecurity - inability: Not on file  . Transportation needs - medical: Not on file  . Transportation needs - non-medical: Not on file  Occupational History  . Occupation: retired  Tobacco Use  . Smoking status: Never Smoker  . Smokeless tobacco: Never Used  Substance and Sexual Activity  . Alcohol use: No  . Drug use: No  . Sexual activity: Not on file  Other Topics Concern  . 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       Patient does not drink caffeine.   Patient is right handed.   Family History  Problem Relation Age of Onset  . Heart attack Father   . Heart disease Father   . Cancer Sister       VITAL SIGNS Ht 5\' 7"  (1.702 m)   Wt 144 lb 8 oz (65.5 kg)   BMI 22.63 kg/m   Outpatient Encounter Medications as of 07/08/2017  Medication Sig  . acetaminophen (TYLENOL) 500 MG tablet Take 1,000 mg by mouth every 6 (six)  hours as needed for mild pain.  Marland Kitchen aspirin 81 MG chewable tablet Chew 81 mg by mouth daily.  . betamethasone dipropionate (DIPROLENE) 0.05 % cream Apply 1 application topically daily. Apply to Groin area  . Emollient (EUCERIN) lotion Apply to Dry skin topically two times daily  . escitalopram (LEXAPRO) 5 MG tablet Take 5 mg by mouth daily. For depression   . felodipine (PLENDIL) 10 MG 24 hr tablet Take 10 mg by mouth daily.  Marland Kitchen HYDRALAZINE HCL PO Give 100 mg by mouth three times a day for Prophylaxis  . levETIRAcetam (KEPPRA) 750 MG tablet Take 750 mg by mouth 2 (two) times daily.  . Nutritional Supplements (NUTRITIONAL SUPPLEMENT PO) HSG NAS Diet - HSG Mech Soft Texture, Regular thin consistency  .  olmesartan (BENICAR) 40 MG tablet Take 40 mg by mouth daily.  . [DISCONTINUED] acetaminophen (TYLENOL) 500 MG tablet Take 2 tablets (1,000 mg total) by mouth every 6 (six) hours as needed. (Patient not taking: Reported on 07/08/2017)  . [DISCONTINUED] NUTRITIONAL SUPPLEMENT LIQD House supplement - Med Pass - Give 120 cc by mouth three times daily Nutritional treat - Give 4 ounces by mouth three times daily   No facility-administered encounter medications on file as of 07/08/2017.      SIGNIFICANT DIAGNOSTIC EXAMS  DIAGNOSTIC EXAMS  PREVIOUS:    05-31-17: chest x-ray: No acute cardiopulmonary process.   05-31-17: ct of head and spine: No acute intracranial abnormality. Stable cerebral atrophy, chronic small vessel disease, and old lacunar infarcts. Mild right frontal scalp hematoma.  No evidence of skull fracture. No evidence of acute cervical spine fracture or subluxation. Degenerative spondylosis, as described above.  NO NEW EXAMS     LABS REVIEWED:PREVIOUS   07-23-16: wbc 4.9; hgb 12.4; hct 36.8; mcv 89.;6 plt 181; glucose 81; bun 15.6; creat 0.77; k+ 4.0; na++ 143; ca 8.9; liver normal albumin 3.7  10-23-16: wbc 9.8; hgb 12.8; hct 37.1; mcv 87.6; plt 125; glucose 131; bun 19.5; creat 0.93; k+  3.6; na++ 150; ca 8.5  liver normal albumin 3.3 urine culture proteus mirabilis: rocephin  05-31-17: wbc 9.0; hgb 14.3; hct 43.2; mcv 87.1; plt 124; glucose 177; bun 12; creat 1.20; k+ 4.0; na+ 136; ca 8.9; liver normal albumin 3.4; blood culture: no growth; rapid flu: + A; urine culture: e-coli   06-03-17: wbc 5.6; hgb 11.8 hct 36.0; mv 85.9; plt 163; glucose 95; bun 20; creat 0.83; k+ 3.5; na++ 141; ca 8.5 06-06-17: wbc 7.4; hgb 11.4; hct 33.2; mcv 84.7; plt 170; glucose 94; bun 16; creat 0.85; k+ 3.4; na++ 139; ca 8.6   NO NEW LABS    Review of Systems  Unable to perform ROS: Dementia (nonverbal )    Physical Exam  Constitutional: No distress.  Frail   Neck: No thyromegaly present.    Cardiovascular: Normal rate, regular rhythm and intact distal pulses.  Murmur heard. 1/6  Pulmonary/Chest: Effort normal and breath sounds normal.  Abdominal: Soft. Bowel sounds are normal. He exhibits no distension. There is no tenderness.  Musculoskeletal: He exhibits no edema.  Is able to move extremities   Lymphadenopathy:    He has no cervical adenopathy.  Neurological:  Is aware   Skin: Skin is warm and dry. He is not diaphoretic.  Psychiatric: He has a normal mood and affect.     ASSESSMENT/PLAN  TODAY:   1. CVA: 2. Vascular dementia without behavioral disturbance/Alzheimer's dementia;    Will continue his current plan of care and his  current medication regimen.  Time spent with family/patient: 40 minutes: did discuss medications; medical status; expectations of care; 20 minutes spent reviewing his most form and advanced directives. Verbalized understanding.   MD is aware of resident's narcotic use and is in agreement with current plan of care. We will attempt to wean resident as apropriate    Ok Edwards NP [facility]  Contact [phone] Monday through Friday 8am- 5pm  After hours call [phone]

## 2017-07-14 DIAGNOSIS — R569 Unspecified convulsions: Secondary | ICD-10-CM | POA: Diagnosis not present

## 2017-07-14 DIAGNOSIS — F0150 Vascular dementia without behavioral disturbance: Secondary | ICD-10-CM | POA: Diagnosis not present

## 2017-07-14 DIAGNOSIS — I350 Nonrheumatic aortic (valve) stenosis: Secondary | ICD-10-CM | POA: Diagnosis not present

## 2017-07-14 DIAGNOSIS — I10 Essential (primary) hypertension: Secondary | ICD-10-CM | POA: Diagnosis not present

## 2017-07-14 DIAGNOSIS — G309 Alzheimer's disease, unspecified: Secondary | ICD-10-CM | POA: Diagnosis not present

## 2017-07-14 DIAGNOSIS — I679 Cerebrovascular disease, unspecified: Secondary | ICD-10-CM | POA: Diagnosis not present

## 2017-07-20 DIAGNOSIS — R569 Unspecified convulsions: Secondary | ICD-10-CM | POA: Diagnosis not present

## 2017-07-20 DIAGNOSIS — I679 Cerebrovascular disease, unspecified: Secondary | ICD-10-CM | POA: Diagnosis not present

## 2017-07-20 DIAGNOSIS — I10 Essential (primary) hypertension: Secondary | ICD-10-CM | POA: Diagnosis not present

## 2017-07-20 DIAGNOSIS — F0150 Vascular dementia without behavioral disturbance: Secondary | ICD-10-CM | POA: Diagnosis not present

## 2017-07-20 DIAGNOSIS — I350 Nonrheumatic aortic (valve) stenosis: Secondary | ICD-10-CM | POA: Diagnosis not present

## 2017-07-20 DIAGNOSIS — G309 Alzheimer's disease, unspecified: Secondary | ICD-10-CM | POA: Diagnosis not present

## 2017-07-21 DIAGNOSIS — G309 Alzheimer's disease, unspecified: Secondary | ICD-10-CM | POA: Diagnosis not present

## 2017-07-21 DIAGNOSIS — I679 Cerebrovascular disease, unspecified: Secondary | ICD-10-CM | POA: Diagnosis not present

## 2017-07-21 DIAGNOSIS — I10 Essential (primary) hypertension: Secondary | ICD-10-CM | POA: Diagnosis not present

## 2017-07-21 DIAGNOSIS — I350 Nonrheumatic aortic (valve) stenosis: Secondary | ICD-10-CM | POA: Diagnosis not present

## 2017-07-21 DIAGNOSIS — R569 Unspecified convulsions: Secondary | ICD-10-CM | POA: Diagnosis not present

## 2017-07-21 DIAGNOSIS — F0150 Vascular dementia without behavioral disturbance: Secondary | ICD-10-CM | POA: Diagnosis not present

## 2017-07-28 DIAGNOSIS — R569 Unspecified convulsions: Secondary | ICD-10-CM | POA: Diagnosis not present

## 2017-07-28 DIAGNOSIS — G309 Alzheimer's disease, unspecified: Secondary | ICD-10-CM | POA: Diagnosis not present

## 2017-07-28 DIAGNOSIS — I679 Cerebrovascular disease, unspecified: Secondary | ICD-10-CM | POA: Diagnosis not present

## 2017-07-28 DIAGNOSIS — I350 Nonrheumatic aortic (valve) stenosis: Secondary | ICD-10-CM | POA: Diagnosis not present

## 2017-07-28 DIAGNOSIS — F0150 Vascular dementia without behavioral disturbance: Secondary | ICD-10-CM | POA: Diagnosis not present

## 2017-07-28 DIAGNOSIS — I10 Essential (primary) hypertension: Secondary | ICD-10-CM | POA: Diagnosis not present

## 2017-08-03 ENCOUNTER — Encounter: Payer: Self-pay | Admitting: Adult Health

## 2017-08-03 ENCOUNTER — Non-Acute Institutional Stay (SKILLED_NURSING_FACILITY): Payer: Medicare Other | Admitting: Adult Health

## 2017-08-03 DIAGNOSIS — I679 Cerebrovascular disease, unspecified: Secondary | ICD-10-CM | POA: Diagnosis not present

## 2017-08-03 DIAGNOSIS — F0150 Vascular dementia without behavioral disturbance: Secondary | ICD-10-CM

## 2017-08-03 DIAGNOSIS — R1310 Dysphagia, unspecified: Secondary | ICD-10-CM | POA: Diagnosis not present

## 2017-08-03 DIAGNOSIS — G309 Alzheimer's disease, unspecified: Secondary | ICD-10-CM | POA: Diagnosis not present

## 2017-08-03 DIAGNOSIS — I350 Nonrheumatic aortic (valve) stenosis: Secondary | ICD-10-CM | POA: Diagnosis not present

## 2017-08-03 DIAGNOSIS — F339 Major depressive disorder, recurrent, unspecified: Secondary | ICD-10-CM | POA: Diagnosis not present

## 2017-08-03 DIAGNOSIS — R569 Unspecified convulsions: Secondary | ICD-10-CM | POA: Diagnosis not present

## 2017-08-03 DIAGNOSIS — I639 Cerebral infarction, unspecified: Secondary | ICD-10-CM | POA: Diagnosis not present

## 2017-08-03 DIAGNOSIS — I10 Essential (primary) hypertension: Secondary | ICD-10-CM | POA: Diagnosis not present

## 2017-08-03 NOTE — Progress Notes (Signed)
Location:   [facility] Room Number: 107 A Place of Service:  SNF (31)   CODE STATUS: DNR (Most form updated 07-08-17)  No Known Allergies  Chief Complaint  Patient presents with  . Medical Management of Chronic Issues    Cva; hypertension; dementia     HPI:  He is an 84 year old long term resident of this facility being seen for the management of his chronic illnesses: cva; hypertension; dementia. He is unable to participate in the hpi or ros. He is followed by hospice care. There are no reports of any changes in appetite; no indications of pain present; no reports of behavioral issues. There are no nursing concerns at this time.    Past Medical History:  Diagnosis Date  . Aortic insufficiency 07/04/2011   2/13 Study Conclusions  - Left ventricle: Systolic function was normal. The estimated ejection fraction was in the range of 55% to 60%. - Aortic valve: Moderate regurgitation. - Aorta: The aorta was moderately dilated. Transthoracic echocardiography. M-mode, complete 2D, spectral Doppler, and color Doppler. Height: Height: 170.2cm. Height: 67in. Weight: Weight: 84.1kg. Weight: 185lb. Body mass index: BMI: 29kg/m^2. Body surface area: BSA: 2.92m^2. Blood pressure: 175/86. Patient status: Inpatient. Location: Bedside.      . Aortic root dilatation (HCC)    Noted on 09/2010 echo, with mild-mod AI  . CEREBROVASCULAR ACCIDENT, HX OF 02/09/2007  . DEMENTIA 05/12/2007  . Hemorrhoids   . HYPERGLYCEMIA 09/21/2009  . HYPERTENSION 12/29/2006  . Lactose intolerance   . OBSTRUCTIVE SLEEP APNEA 12/29/2006  . PROSTATE CANCER, HX OF 12/29/2006  . Prostatitis   . Risk for falls   . Seizures ([place])     Past Surgical History:  Procedure Laterality Date  . CHOLECYSTECTOMY    . GANGLION CYST EXCISION    . PROSTATE SURGERY     cryotherapy    Social History   Socioeconomic History  . Marital status: Married    Spouse name: Not on file  . Number of children: 2  . Years of  education: master's  . Highest education level: Not on file  Occupational History  . Occupation: retired  Scientific laboratory technician  . Financial resource strain: Not on file  . Food insecurity:    Worry: Not on file    Inability: Not on file  . Transportation needs:    Medical: Not on file    Non-medical: Not on file  Tobacco Use  . Smoking status: Never Smoker  . Smokeless tobacco: Never Used  Substance and Sexual Activity  . Alcohol use: No  . Drug use: No  . Sexual activity: Not on file  Lifestyle  . Physical activity:    Days per week: Not on file    Minutes per session: Not on file  . Stress: Not on file  Relationships  . Social connections:    Talks on phone: Not on file    Gets together: Not on file    Attends religious service: Not on file    Active member of club or organization: Not on file    Attends meetings of clubs or organizations: Not on file    Relationship status: Not on file  . Intimate partner violence:    Fear of current or ex partner: Not on file    Emotionally abused: Not on file    Physically abused: Not on file    Forced sexual activity: Not on file  Other Topics Concern  . 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       Patient does not drink caffeine.   Patient is right handed.   Family History  Problem Relation Age of Onset  . Heart attack Father   . Heart disease Father   . Cancer Sister       VITAL SIGNS Ht 5\' 7"  (1.702 m)   Wt 143 lb (64.9 kg)   BMI 22.40 kg/m   Outpatient Encounter Medications as of 08/03/2017  Medication Sig  . acetaminophen (TYLENOL) 500 MG tablet Take 1,000 mg by mouth every 6 (six) hours as needed for mild pain.  Marland Kitchen aspirin 81 MG chewable tablet Chew 81 mg by mouth daily.  . betamethasone dipropionate (DIPROLENE) 0.05 % cream Apply 1 application topically daily. Apply to Groin area  . Emollient (EUCERIN) lotion Apply to Dry skin topically two times daily  . escitalopram  (LEXAPRO) 5 MG tablet Take 5 mg by mouth daily. For depression   . felodipine (PLENDIL) 10 MG 24 hr tablet Take 10 mg by mouth daily.  Marland Kitchen HYDRALAZINE HCL PO Give 100 mg by mouth three times a day for Prophylaxis  . levETIRAcetam (KEPPRA) 750 MG tablet Take 750 mg by mouth 2 (two) times daily.  . Nutritional Supplements (NUTRITIONAL SUPPLEMENT PO) HSG NAS Diet - HSG Mech Soft Texture, Regular thin consistency  . olmesartan (BENICAR) 40 MG tablet Take 40 mg by mouth daily.   No facility-administered encounter medications on file as of 08/03/2017.      SIGNIFICANT DIAGNOSTIC EXAMS  DIAGNOSTIC EXAMS  PREVIOUS:    05-31-17: chest x-ray: No acute cardiopulmonary process.   05-31-17: ct of head and spine: No acute intracranial abnormality. Stable cerebral atrophy, chronic small vessel disease, and old lacunar infarcts. Mild right frontal scalp hematoma.  No evidence of skull fracture. No evidence of acute cervical spine fracture or subluxation. Degenerative spondylosis, as described above.  NO NEW EXAMS     LABS REVIEWED:PREVIOUS   10-23-16: wbc 9.8; hgb 12.8; hct 37.1; mcv 87.6; plt 125; glucose 131; bun 19.5; creat 0.93; k+  3.6; na++ 150; ca 8.5  liver normal albumin 3.3 urine culture proteus mirabilis: rocephin  05-31-17: wbc 9.0; hgb 14.3; hct 43.2; mcv 87.1; plt 124; glucose 177; bun 12; creat 1.20; k+ 4.0; na+ 136; ca 8.9; liver normal albumin 3.4; blood culture: no growth; rapid flu: + A; urine culture: e-coli   06-03-17: wbc 5.6; hgb 11.8 hct 36.0; mv 85.9; plt 163; glucose 95; bun 20; creat 0.83; k+ 3.5; na++ 141; ca 8.5 06-06-17: wbc 7.4; hgb 11.4; hct 33.2; mcv 84.7; plt 170; glucose 94; bun 16; creat 0.85; k+ 3.4; na++ 139; ca 8.6   NO NEW LABS    Review of Systems  Unable to perform ROS: Dementia (nonverbal )    Physical Exam  Constitutional: No distress.  Thin   Neck: No thyromegaly present.  Cardiovascular: Normal rate, regular rhythm and intact distal pulses.  Murmur  heard. 1/6  Pulmonary/Chest: Effort normal and breath sounds normal. No respiratory distress.  Abdominal: Soft. Bowel sounds are normal. He exhibits no distension. There is no tenderness.  Musculoskeletal: He exhibits no edema.  Is able to move all extremities   Lymphadenopathy:    He has no cervical adenopathy.  Neurological: He is alert.  Skin: Skin is warm and dry. He is not diaphoretic.  Psychiatric: He has a normal mood and affect.     ASSESSMENT/PLAN  TODAY:   1. CVA:  is neurologically stable; will continue asa 81 mg daily   2. Essential hypertension: stable b/p 115/76: will continue benicar 40 mg daily felodinpine ER 10 mg daily hydralazine 100 mg three times daily will monitor   3. Vascular dementia without behavioral disturbance/Alzheimer's dementia; weight is 143 pounds; without change in status; is followed by hospice care; will monitor his status.   PREVIOUS  4. Depression no change in status; will continue lexapro 5 mg daily   5. Normocytic anemia: stable hgb 11.4 will monitor   6. Seizures: no reports of seizure activity present: will continue keppra 750 mg twice daily   7. Severe protein calorie malnutrition: weight is 143 pounds; albumin is 3.4; will continue supplements as directed will monitor        MD is aware of resident's narcotic use and is in agreement with current plan of care. We will attempt to wean resident as apropriate    Ok Edwards NP [facility]  Contact [phone] Monday through Friday 8am- 5pm  After hours call [phone]

## 2017-08-04 DIAGNOSIS — I10 Essential (primary) hypertension: Secondary | ICD-10-CM | POA: Diagnosis not present

## 2017-08-04 DIAGNOSIS — F0150 Vascular dementia without behavioral disturbance: Secondary | ICD-10-CM | POA: Diagnosis not present

## 2017-08-04 DIAGNOSIS — R569 Unspecified convulsions: Secondary | ICD-10-CM | POA: Diagnosis not present

## 2017-08-04 DIAGNOSIS — I679 Cerebrovascular disease, unspecified: Secondary | ICD-10-CM | POA: Diagnosis not present

## 2017-08-04 DIAGNOSIS — G309 Alzheimer's disease, unspecified: Secondary | ICD-10-CM | POA: Diagnosis not present

## 2017-08-04 DIAGNOSIS — I350 Nonrheumatic aortic (valve) stenosis: Secondary | ICD-10-CM | POA: Diagnosis not present

## 2017-08-06 DIAGNOSIS — R569 Unspecified convulsions: Secondary | ICD-10-CM | POA: Diagnosis not present

## 2017-08-06 DIAGNOSIS — I10 Essential (primary) hypertension: Secondary | ICD-10-CM | POA: Diagnosis not present

## 2017-08-06 DIAGNOSIS — F0150 Vascular dementia without behavioral disturbance: Secondary | ICD-10-CM | POA: Diagnosis not present

## 2017-08-06 DIAGNOSIS — I679 Cerebrovascular disease, unspecified: Secondary | ICD-10-CM | POA: Diagnosis not present

## 2017-08-06 DIAGNOSIS — I350 Nonrheumatic aortic (valve) stenosis: Secondary | ICD-10-CM | POA: Diagnosis not present

## 2017-08-06 DIAGNOSIS — G309 Alzheimer's disease, unspecified: Secondary | ICD-10-CM | POA: Diagnosis not present

## 2017-08-09 ENCOUNTER — Encounter: Payer: Self-pay | Admitting: Internal Medicine

## 2017-08-12 DIAGNOSIS — F0150 Vascular dementia without behavioral disturbance: Secondary | ICD-10-CM | POA: Diagnosis not present

## 2017-08-12 DIAGNOSIS — I679 Cerebrovascular disease, unspecified: Secondary | ICD-10-CM | POA: Diagnosis not present

## 2017-08-12 DIAGNOSIS — I350 Nonrheumatic aortic (valve) stenosis: Secondary | ICD-10-CM | POA: Diagnosis not present

## 2017-08-12 DIAGNOSIS — G309 Alzheimer's disease, unspecified: Secondary | ICD-10-CM | POA: Diagnosis not present

## 2017-08-12 DIAGNOSIS — I10 Essential (primary) hypertension: Secondary | ICD-10-CM | POA: Diagnosis not present

## 2017-08-12 DIAGNOSIS — R569 Unspecified convulsions: Secondary | ICD-10-CM | POA: Diagnosis not present

## 2017-08-18 DIAGNOSIS — I10 Essential (primary) hypertension: Secondary | ICD-10-CM | POA: Diagnosis not present

## 2017-08-18 DIAGNOSIS — I350 Nonrheumatic aortic (valve) stenosis: Secondary | ICD-10-CM | POA: Diagnosis not present

## 2017-08-18 DIAGNOSIS — R569 Unspecified convulsions: Secondary | ICD-10-CM | POA: Diagnosis not present

## 2017-08-18 DIAGNOSIS — I679 Cerebrovascular disease, unspecified: Secondary | ICD-10-CM | POA: Diagnosis not present

## 2017-08-18 DIAGNOSIS — F0150 Vascular dementia without behavioral disturbance: Secondary | ICD-10-CM | POA: Diagnosis not present

## 2017-08-18 DIAGNOSIS — G309 Alzheimer's disease, unspecified: Secondary | ICD-10-CM | POA: Diagnosis not present

## 2017-08-20 DIAGNOSIS — F0150 Vascular dementia without behavioral disturbance: Secondary | ICD-10-CM | POA: Diagnosis not present

## 2017-08-20 DIAGNOSIS — I679 Cerebrovascular disease, unspecified: Secondary | ICD-10-CM | POA: Diagnosis not present

## 2017-08-20 DIAGNOSIS — R569 Unspecified convulsions: Secondary | ICD-10-CM | POA: Diagnosis not present

## 2017-08-20 DIAGNOSIS — G309 Alzheimer's disease, unspecified: Secondary | ICD-10-CM | POA: Diagnosis not present

## 2017-08-20 DIAGNOSIS — I350 Nonrheumatic aortic (valve) stenosis: Secondary | ICD-10-CM | POA: Diagnosis not present

## 2017-08-20 DIAGNOSIS — I10 Essential (primary) hypertension: Secondary | ICD-10-CM | POA: Diagnosis not present

## 2017-08-24 ENCOUNTER — Inpatient Hospital Stay (HOSPITAL_COMMUNITY)
Admission: EM | Admit: 2017-08-24 | Discharge: 2017-08-27 | DRG: 871 | Disposition: A | Discharge status: Skilled Nursing Facility | Attending: Internal Medicine | Admitting: Internal Medicine

## 2017-08-24 ENCOUNTER — Emergency Department (HOSPITAL_COMMUNITY)

## 2017-08-24 ENCOUNTER — Encounter (HOSPITAL_COMMUNITY): Payer: Self-pay | Admitting: *Deleted

## 2017-08-24 ENCOUNTER — Other Ambulatory Visit: Payer: Self-pay

## 2017-08-24 DIAGNOSIS — Z9049 Acquired absence of other specified parts of digestive tract: Secondary | ICD-10-CM

## 2017-08-24 DIAGNOSIS — A419 Sepsis, unspecified organism: Principal | ICD-10-CM | POA: Diagnosis present

## 2017-08-24 DIAGNOSIS — G40909 Epilepsy, unspecified, not intractable, without status epilepticus: Secondary | ICD-10-CM | POA: Diagnosis present

## 2017-08-24 DIAGNOSIS — J9859 Other diseases of mediastinum, not elsewhere classified: Secondary | ICD-10-CM | POA: Diagnosis present

## 2017-08-24 DIAGNOSIS — R569 Unspecified convulsions: Secondary | ICD-10-CM | POA: Diagnosis not present

## 2017-08-24 DIAGNOSIS — R1310 Dysphagia, unspecified: Secondary | ICD-10-CM | POA: Diagnosis not present

## 2017-08-24 DIAGNOSIS — Z91011 Allergy to milk products: Secondary | ICD-10-CM

## 2017-08-24 DIAGNOSIS — R000 Tachycardia, unspecified: Secondary | ICD-10-CM | POA: Diagnosis present

## 2017-08-24 DIAGNOSIS — Z8546 Personal history of malignant neoplasm of prostate: Secondary | ICD-10-CM

## 2017-08-24 DIAGNOSIS — R1319 Other dysphagia: Secondary | ICD-10-CM | POA: Diagnosis present

## 2017-08-24 DIAGNOSIS — G4733 Obstructive sleep apnea (adult) (pediatric): Secondary | ICD-10-CM | POA: Diagnosis present

## 2017-08-24 DIAGNOSIS — I679 Cerebrovascular disease, unspecified: Secondary | ICD-10-CM | POA: Diagnosis not present

## 2017-08-24 DIAGNOSIS — F0150 Vascular dementia without behavioral disturbance: Secondary | ICD-10-CM | POA: Diagnosis present

## 2017-08-24 DIAGNOSIS — I69318 Other symptoms and signs involving cognitive functions following cerebral infarction: Secondary | ICD-10-CM

## 2017-08-24 DIAGNOSIS — I6789 Other cerebrovascular disease: Secondary | ICD-10-CM | POA: Diagnosis not present

## 2017-08-24 DIAGNOSIS — R64 Cachexia: Secondary | ICD-10-CM | POA: Diagnosis present

## 2017-08-24 DIAGNOSIS — G309 Alzheimer's disease, unspecified: Secondary | ICD-10-CM | POA: Diagnosis present

## 2017-08-24 DIAGNOSIS — Z9181 History of falling: Secondary | ICD-10-CM

## 2017-08-24 DIAGNOSIS — I10 Essential (primary) hypertension: Secondary | ICD-10-CM | POA: Diagnosis not present

## 2017-08-24 DIAGNOSIS — T17908A Unspecified foreign body in respiratory tract, part unspecified causing other injury, initial encounter: Secondary | ICD-10-CM | POA: Diagnosis present

## 2017-08-24 DIAGNOSIS — F0280 Dementia in other diseases classified elsewhere without behavioral disturbance: Secondary | ICD-10-CM | POA: Diagnosis present

## 2017-08-24 DIAGNOSIS — Z79899 Other long term (current) drug therapy: Secondary | ICD-10-CM

## 2017-08-24 DIAGNOSIS — Z66 Do not resuscitate: Secondary | ICD-10-CM | POA: Diagnosis present

## 2017-08-24 DIAGNOSIS — Z515 Encounter for palliative care: Secondary | ICD-10-CM | POA: Diagnosis present

## 2017-08-24 DIAGNOSIS — J690 Pneumonitis due to inhalation of food and vomit: Secondary | ICD-10-CM | POA: Diagnosis present

## 2017-08-24 DIAGNOSIS — I350 Nonrheumatic aortic (valve) stenosis: Secondary | ICD-10-CM | POA: Diagnosis not present

## 2017-08-24 DIAGNOSIS — J810 Acute pulmonary edema: Secondary | ICD-10-CM | POA: Diagnosis present

## 2017-08-24 DIAGNOSIS — Z7401 Bed confinement status: Secondary | ICD-10-CM

## 2017-08-24 DIAGNOSIS — I77810 Thoracic aortic ectasia: Secondary | ICD-10-CM | POA: Diagnosis present

## 2017-08-24 DIAGNOSIS — R29810 Facial weakness: Secondary | ICD-10-CM | POA: Diagnosis not present

## 2017-08-24 DIAGNOSIS — R9389 Abnormal findings on diagnostic imaging of other specified body structures: Secondary | ICD-10-CM | POA: Diagnosis present

## 2017-08-24 DIAGNOSIS — Z8249 Family history of ischemic heart disease and other diseases of the circulatory system: Secondary | ICD-10-CM

## 2017-08-24 DIAGNOSIS — J9601 Acute respiratory failure with hypoxia: Secondary | ICD-10-CM | POA: Diagnosis present

## 2017-08-24 DIAGNOSIS — Z6822 Body mass index (BMI) 22.0-22.9, adult: Secondary | ICD-10-CM

## 2017-08-24 DIAGNOSIS — I160 Hypertensive urgency: Secondary | ICD-10-CM | POA: Diagnosis present

## 2017-08-24 DIAGNOSIS — Z7982 Long term (current) use of aspirin: Secondary | ICD-10-CM

## 2017-08-24 LAB — URINALYSIS, ROUTINE W REFLEX MICROSCOPIC
BACTERIA UA: NONE SEEN
BILIRUBIN URINE: NEGATIVE
Glucose, UA: NEGATIVE mg/dL
HGB URINE DIPSTICK: NEGATIVE
Ketones, ur: 5 mg/dL — AB
NITRITE: NEGATIVE
PROTEIN: 100 mg/dL — AB
SPECIFIC GRAVITY, URINE: 1.024 (ref 1.005–1.030)
pH: 5 (ref 5.0–8.0)

## 2017-08-24 LAB — CBC WITH DIFFERENTIAL/PLATELET
BASOS PCT: 0 %
Basophils Absolute: 0 10*3/uL (ref 0.0–0.1)
EOS ABS: 0 10*3/uL (ref 0.0–0.7)
EOS PCT: 0 %
HCT: 42.5 % (ref 39.0–52.0)
HEMOGLOBIN: 14.3 g/dL (ref 13.0–17.0)
LYMPHS ABS: 1.4 10*3/uL (ref 0.7–4.0)
Lymphocytes Relative: 15 %
MCH: 29.1 pg (ref 26.0–34.0)
MCHC: 33.6 g/dL (ref 30.0–36.0)
MCV: 86.6 fL (ref 78.0–100.0)
Monocytes Absolute: 0.8 10*3/uL (ref 0.1–1.0)
Monocytes Relative: 9 %
NEUTROS PCT: 76 %
Neutro Abs: 6.8 10*3/uL (ref 1.7–7.7)
PLATELETS: 191 10*3/uL (ref 150–400)
RBC: 4.91 MIL/uL (ref 4.22–5.81)
RDW: 14.8 % (ref 11.5–15.5)
WBC: 9 10*3/uL (ref 4.0–10.5)

## 2017-08-24 LAB — PROTIME-INR
INR: 1.04
PROTHROMBIN TIME: 13.5 s (ref 11.4–15.2)

## 2017-08-24 LAB — COMPREHENSIVE METABOLIC PANEL
ALK PHOS: 104 U/L (ref 38–126)
ALT: 15 U/L — AB (ref 17–63)
ANION GAP: 11 (ref 5–15)
AST: 22 U/L (ref 15–41)
Albumin: 3.8 g/dL (ref 3.5–5.0)
BILIRUBIN TOTAL: 0.8 mg/dL (ref 0.3–1.2)
BUN: 11 mg/dL (ref 6–20)
CALCIUM: 9.4 mg/dL (ref 8.9–10.3)
CO2: 25 mmol/L (ref 22–32)
CREATININE: 0.96 mg/dL (ref 0.61–1.24)
Chloride: 102 mmol/L (ref 101–111)
Glucose, Bld: 125 mg/dL — ABNORMAL HIGH (ref 65–99)
Potassium: 4.4 mmol/L (ref 3.5–5.1)
Sodium: 138 mmol/L (ref 135–145)
TOTAL PROTEIN: 7.8 g/dL (ref 6.5–8.1)

## 2017-08-24 LAB — I-STAT CG4 LACTIC ACID, ED: Lactic Acid, Venous: 1.49 mmol/L (ref 0.5–1.9)

## 2017-08-24 MED ORDER — SODIUM CHLORIDE 3 % IN NEBU
4.0000 mL | INHALATION_SOLUTION | Freq: Once | RESPIRATORY_TRACT | Status: AC | PRN
Start: 2017-08-24 — End: 2017-08-25
  Administered 2017-08-25: 4 mL via RESPIRATORY_TRACT
  Filled 2017-08-24: qty 4

## 2017-08-24 MED ORDER — LACTATED RINGERS IV BOLUS
1000.0000 mL | Freq: Once | INTRAVENOUS | Status: AC
  Administered 2017-08-24: 1000 mL via INTRAVENOUS

## 2017-08-24 MED ORDER — PIPERACILLIN-TAZOBACTAM 3.375 G IVPB 30 MIN
3.3750 g | INTRAVENOUS | Status: AC
  Administered 2017-08-24: 3.375 g via INTRAVENOUS
  Filled 2017-08-24: qty 50

## 2017-08-24 NOTE — ED Provider Notes (Signed)
[facility] EMERGENCY DEPARTMENT Provider Note   CSN: [id number] Arrival date & time: 08/24/17  2148     History   Chief Complaint Chief Complaint  Patient presents with  . Altered Mental Status    HPI Mike Williams is a 84 y.o. male.  The history is provided by the patient.  Altered Mental Status   This is a new problem. The current episode started 6 to 12 hours ago. The problem has been gradually worsening. Associated symptoms comments: Cough, difficulty swallowing. Risk factors: dementia, HTN, dsyphagia, in hospice. His past medical history is significant for hypertension and dementia.    Past Medical History:  Diagnosis Date  . Aortic insufficiency 07/04/2011   2/13 Study Conclusions  - Left ventricle: Systolic function was normal. The estimated ejection fraction was in the range of 55% to 60%. - Aortic valve: Moderate regurgitation. - Aorta: The aorta was moderately dilated. Transthoracic echocardiography. M-mode, complete 2D, spectral Doppler, and color Doppler. Height: Height: 170.2cm. Height: 67in. Weight: Weight: 84.1kg. Weight: 185lb. Body mass index: BMI: 29kg/m^2. Body surface area: BSA: 2.28m^2. Blood pressure: 175/86. Patient status: Inpatient. Location: Bedside.      . Aortic root dilatation (HCC)    Noted on 09/2010 echo, with mild-mod AI  . CEREBROVASCULAR ACCIDENT, HX OF 02/09/2007  . DEMENTIA 05/12/2007  . Hemorrhoids   . HYPERGLYCEMIA 09/21/2009  . HYPERTENSION 12/29/2006  . Lactose intolerance   . OBSTRUCTIVE SLEEP APNEA 12/29/2006  . PROSTATE CANCER, HX OF 12/29/2006  . Prostatitis   . Risk for falls   . Seizures [facility])     Patient Active Problem List   Diagnosis Date Noted  . Aspiration into respiratory tract 08/24/2017  . Vascular dementia without behavioral disturbance   . Palliative care encounter   . Acute lower UTI 06/03/2017  . Toxic encephalopathy 06/03/2017  . Normocytic anemia 06/03/2017  . Severe protein-calorie malnutrition  ([place]) 06/03/2017  . Influenza 06/02/2017  . CVA (cerebral vascular accident) ([place]) 11/28/2016  . Depression 12/01/2015  . PBA (pseudobulbar affect) 08/08/2015  . Overactive bladder 06/29/2014  . Seizures ([place]) 02/01/2013  . Aortic insufficiency 07/04/2011  . Alzheimer's dementia 05/12/2007  . Obstructive sleep apnea 12/29/2006  . Essential hypertension 12/29/2006  . PROSTATE CANCER, HX OF 12/29/2006    Past Surgical History:  Procedure Laterality Date  . CHOLECYSTECTOMY    . GANGLION CYST EXCISION    . PROSTATE SURGERY     cryotherapy        Home Medications    Prior to Admission medications   Medication Sig Start Date End Date Taking? Authorizing Provider  acetaminophen (TYLENOL) 500 MG tablet Take 1,000 mg by mouth every 6 (six) hours as needed (pain).    Yes [provider]  aspirin 81 MG chewable tablet Chew 81 mg by mouth daily. 06/17/17  Yes [provider]  betamethasone dipropionate (DIPROLENE) 0.05 % cream Apply 1 application topically daily. Apply to Groin area   Yes [provider]  Emollient (EUCERIN) lotion Apply topically 2 (two) times daily. For dry skin   Yes [provider]  escitalopram (LEXAPRO) 5 MG tablet Take 5 mg by mouth daily. For depression    Yes [provider]  felodipine (PLENDIL) 10 MG 24 hr tablet Take 10 mg by mouth daily.   Yes [provider]  hydrALAZINE (APRESOLINE) 100 MG tablet Take 100 mg by mouth 3 (three) times daily.   Yes [provider]  levETIRAcetam (KEPPRA) 750 MG tablet Take  750 mg by mouth 2 (two) times daily.   Yes [provider]  olmesartan (BENICAR) 40 MG tablet Take 40 mg by mouth daily.   Yes [provider]    Family History Family History  Problem Relation Age of Onset  . Heart attack Father   . Heart disease Father   . Cancer Sister     Social History Social History   Tobacco Use  . Smoking status: Never Smoker  . Smokeless  tobacco: Never Used  Substance Use Topics  . Alcohol use: No  . Drug use: No     Allergies   Patient has no known allergies.   Review of Systems Review of Systems  Unable to perform ROS: Dementia     Physical Exam Updated Vital Signs  ED Triage Vitals  Enc Vitals Group     BP 08/24/17 2158 (!) 160/108     Pulse Rate 08/24/17 2157 (!) 108     Resp 08/24/17 2157 (!) 30     Temp 08/24/17 2157 (!) 101.9 F (38.8 C)     Temp Source 08/24/17 2157 Oral     SpO2 08/24/17 2148 95 %     Weight --      Height --      Head Circumference --      Peak Flow --      Pain Score --      Pain Loc --      Pain Edu? --      Excl. in [place]? --     Physical Exam  Constitutional: He appears cachectic. He has a sickly appearance. He appears ill.  HENT:  Head: Normocephalic and atraumatic.  Right Ear: External ear normal.  Left Ear: External ear normal.  Mouth/Throat: No oropharyngeal exudate.  Eyes: Pupils are equal, round, and reactive to light. Conjunctivae and EOM are normal.  Neck: Normal range of motion. Neck supple.  Cardiovascular: Regular rhythm, normal heart sounds and intact distal pulses. Tachycardia present.  No murmur heard. Pulmonary/Chest: No respiratory distress. He has no wheezes.  Decreased breath sounds throughout, poor effort  Abdominal: Soft. Bowel sounds are normal. He exhibits no distension and no mass. There is no tenderness. There is no guarding.  Musculoskeletal: Normal range of motion. He exhibits no edema.  Neurological: He is alert.  Follows commands, moves all extremities, non-verbal  Skin: Skin is warm and dry. Capillary refill takes less than 2 seconds. No rash noted.  Psychiatric: He has a normal mood and affect.  Nursing note and vitals reviewed.    ED Treatments / Results  Labs (all labs ordered are listed, but only abnormal results are displayed) Labs Reviewed  COMPREHENSIVE METABOLIC PANEL - Abnormal; Notable for the following components:       Result Value   Glucose, Bld 125 (*)    ALT 15 (*)    All other components within normal limits  URINALYSIS, ROUTINE W REFLEX MICROSCOPIC - Abnormal; Notable for the following components:   APPearance HAZY (*)    Ketones, ur 5 (*)    Protein, ur 100 (*)    Leukocytes, UA LARGE (*)    Squamous Epithelial / LPF 0-5 (*)    All other components within normal limits  CULTURE, BLOOD (ROUTINE X 2)  CULTURE, BLOOD (ROUTINE X 2)  URINE CULTURE  CBC WITH DIFFERENTIAL/PLATELET  PROTIME-INR  I-STAT CG4 LACTIC ACID, ED    EKG None  Radiology Dg Chest Portable 1 View  Result Date: 08/24/2017 CLINICAL  DATA:  Cough.  Dysphagia. EXAM: PORTABLE CHEST 1 VIEW COMPARISON:  06/02/2017 FINDINGS: Mild enlargement of the cardiac silhouette. Apparent widening of the mediastinum. Right paratracheal thickening. Tortuosity and calcific atherosclerotic disease of the aorta. Elevation of the left hemidiaphragm. There is no evidence of focal airspace consolidation, pleural effusion or pneumothorax. Osseous structures are without acute abnormality. Soft tissues are grossly normal. IMPRESSION: Apparent widening of the mediastinum. This may be due to ectatic ascending and descending thoracic aorta, however aneurysmal dilation of the aorta cannot be excluded. If found clinically appropriate, CT angiogram of the chest may be used for further evaluation. No evidence of lobar consolidation. Electronically Signed   By: Fidela Salisbury [name]   On: 08/24/2017 22:14    Procedures Procedures (including critical care time)  Medications Ordered in ED Medications  sodium chloride HYPERTONIC 3 % nebulizer solution 4 mL (has no administration in time range)  lactated ringers bolus 1,000 mL (0 mLs Intravenous Stopped 08/24/17 2306)  piperacillin-tazobactam (ZOSYN) IVPB 3.375 g (0 g Intravenous Stopped 08/24/17 2305)     Initial Impression / Assessment and Plan / ED Course  I have reviewed the triage vital signs and the  nursing notes.  Pertinent labs & imaging results that were available during my care of the patient were reviewed by me and considered in my medical decision making (see chart for details).     Mike Williams is an 84 year old male with a history of dementia, hypertension, dysphasia who presents to the ED with altered mental status.  Patient febrile, tachypneic, tachycardic upon arrival.  Patient is able to follow commands but is nonverbal at baseline.  Patient with fever and tachycardia and concern for sepsis.  Patient has history of dysphasia and concern for aspiration pneumonia versus urinary tract infection.  Does not appear to have any abdominal tenderness on exam.  Sepsis order set was placed and patient given empirically IV Zosyn for presumed aspiration pneumonia.  Patient appears clinically dry on exam as well with dry mucous membranes.  Patient is chronically ill-appearing.  Patient does come with DNR form as well.  However, wife at the bedside states that she may want intubation CPR if needed because she would like her children to be at the bedside.  However, at this time patient is protecting his airway.  Patient put on 2 L but breathing well on room air.  Has difficulty with coughing of sputum production given his history of dysphagia, will give NS neb.  Patient is currently in hospice at this time due to dementia.  Patient with urine culture, chest x-ray, blood cultures collected.  We will also obtain basic labs, including lactic acid.  Patient given 1 L LR bolus and IV Zosyn for concern for sepsis.  Patient with equivocal urine but  urine culture collected.  Chest x-ray shows no signs of pneumonia at this time, however, may be due to dehydration.  Patient with tortuous aorta but clinically no signs of dissection.  Discussed this with the family and they would not want surgery if patient did have dissection anyways and will hold off any imaging at this time. Will treat for clinical pneumonia.  Patient otherwise with no significant anemia, electrolyte abnormality, leukocytosis. Cr wnl. Lactic acid within normal limits. Patient with hemodynamically stable throughout my care and admitted to medicine for further care.  Final Clinical Impressions(s) / ED Diagnoses   Final diagnoses:  Sepsis, due to unspecified organism ([place])  Aspiration pneumonia, unspecified aspiration pneumonia type, unspecified laterality, unspecified part  of lung [facility])    ED Discharge Orders    None       Lennice Sites, DO 08/24/17 2321    Deno Etienne, DO 08/24/17 [place], DO 09/01/17 1347

## 2017-08-24 NOTE — ED Triage Notes (Addendum)
Pt arrived from [facility] for dysphagia that started around 1500. Pt was eating and staff realized pt had started pocketing his food. Pt suctioned numerous times with EMS. Is on hospice care and has hx of dementia. Pt is bedbound and nonverbal

## 2017-08-24 NOTE — ED Notes (Signed)
Unable to collect 2nd set of blood cultures, pt has been stuck multiple times

## 2017-08-24 NOTE — ED Notes (Signed)
Patient desaturated, coughing and phlem in the mouth.  Suctioned and then sats returned to 94-99%.

## 2017-08-25 ENCOUNTER — Observation Stay (HOSPITAL_COMMUNITY)

## 2017-08-25 DIAGNOSIS — T17908A Unspecified foreign body in respiratory tract, part unspecified causing other injury, initial encounter: Secondary | ICD-10-CM | POA: Diagnosis present

## 2017-08-25 DIAGNOSIS — R569 Unspecified convulsions: Secondary | ICD-10-CM | POA: Diagnosis not present

## 2017-08-25 DIAGNOSIS — I10 Essential (primary) hypertension: Secondary | ICD-10-CM

## 2017-08-25 DIAGNOSIS — Z9181 History of falling: Secondary | ICD-10-CM | POA: Diagnosis not present

## 2017-08-25 DIAGNOSIS — G40909 Epilepsy, unspecified, not intractable, without status epilepticus: Secondary | ICD-10-CM | POA: Diagnosis present

## 2017-08-25 DIAGNOSIS — Z7401 Bed confinement status: Secondary | ICD-10-CM | POA: Diagnosis not present

## 2017-08-25 DIAGNOSIS — Z515 Encounter for palliative care: Secondary | ICD-10-CM | POA: Diagnosis present

## 2017-08-25 DIAGNOSIS — J9601 Acute respiratory failure with hypoxia: Secondary | ICD-10-CM | POA: Diagnosis present

## 2017-08-25 DIAGNOSIS — I350 Nonrheumatic aortic (valve) stenosis: Secondary | ICD-10-CM | POA: Diagnosis not present

## 2017-08-25 DIAGNOSIS — I160 Hypertensive urgency: Secondary | ICD-10-CM

## 2017-08-25 DIAGNOSIS — R1319 Other dysphagia: Secondary | ICD-10-CM | POA: Diagnosis present

## 2017-08-25 DIAGNOSIS — F0150 Vascular dementia without behavioral disturbance: Secondary | ICD-10-CM | POA: Diagnosis not present

## 2017-08-25 DIAGNOSIS — Z8546 Personal history of malignant neoplasm of prostate: Secondary | ICD-10-CM | POA: Diagnosis not present

## 2017-08-25 DIAGNOSIS — F0280 Dementia in other diseases classified elsewhere without behavioral disturbance: Secondary | ICD-10-CM | POA: Diagnosis not present

## 2017-08-25 DIAGNOSIS — R64 Cachexia: Secondary | ICD-10-CM | POA: Diagnosis present

## 2017-08-25 DIAGNOSIS — R000 Tachycardia, unspecified: Secondary | ICD-10-CM | POA: Diagnosis present

## 2017-08-25 DIAGNOSIS — G301 Alzheimer's disease with late onset: Secondary | ICD-10-CM

## 2017-08-25 DIAGNOSIS — R9389 Abnormal findings on diagnostic imaging of other specified body structures: Secondary | ICD-10-CM | POA: Diagnosis present

## 2017-08-25 DIAGNOSIS — Z6822 Body mass index (BMI) 22.0-22.9, adult: Secondary | ICD-10-CM | POA: Diagnosis not present

## 2017-08-25 DIAGNOSIS — I77810 Thoracic aortic ectasia: Secondary | ICD-10-CM | POA: Diagnosis present

## 2017-08-25 DIAGNOSIS — A419 Sepsis, unspecified organism: Secondary | ICD-10-CM | POA: Diagnosis present

## 2017-08-25 DIAGNOSIS — Z8249 Family history of ischemic heart disease and other diseases of the circulatory system: Secondary | ICD-10-CM | POA: Diagnosis not present

## 2017-08-25 DIAGNOSIS — G4733 Obstructive sleep apnea (adult) (pediatric): Secondary | ICD-10-CM | POA: Diagnosis present

## 2017-08-25 DIAGNOSIS — J9859 Other diseases of mediastinum, not elsewhere classified: Secondary | ICD-10-CM | POA: Diagnosis present

## 2017-08-25 DIAGNOSIS — Z743 Need for continuous supervision: Secondary | ICD-10-CM | POA: Diagnosis not present

## 2017-08-25 DIAGNOSIS — J810 Acute pulmonary edema: Secondary | ICD-10-CM | POA: Diagnosis present

## 2017-08-25 DIAGNOSIS — I69318 Other symptoms and signs involving cognitive functions following cerebral infarction: Secondary | ICD-10-CM | POA: Diagnosis not present

## 2017-08-25 DIAGNOSIS — J690 Pneumonitis due to inhalation of food and vomit: Secondary | ICD-10-CM | POA: Diagnosis not present

## 2017-08-25 DIAGNOSIS — G309 Alzheimer's disease, unspecified: Secondary | ICD-10-CM | POA: Diagnosis not present

## 2017-08-25 DIAGNOSIS — R279 Unspecified lack of coordination: Secondary | ICD-10-CM | POA: Diagnosis not present

## 2017-08-25 DIAGNOSIS — I679 Cerebrovascular disease, unspecified: Secondary | ICD-10-CM | POA: Diagnosis not present

## 2017-08-25 DIAGNOSIS — Z9049 Acquired absence of other specified parts of digestive tract: Secondary | ICD-10-CM | POA: Diagnosis not present

## 2017-08-25 DIAGNOSIS — Z66 Do not resuscitate: Secondary | ICD-10-CM | POA: Diagnosis present

## 2017-08-25 LAB — BASIC METABOLIC PANEL
ANION GAP: 10 (ref 5–15)
BUN: 10 mg/dL (ref 6–20)
CALCIUM: 8.3 mg/dL — AB (ref 8.9–10.3)
CO2: 23 mmol/L (ref 22–32)
Chloride: 103 mmol/L (ref 101–111)
Creatinine, Ser: 0.91 mg/dL (ref 0.61–1.24)
GLUCOSE: 111 mg/dL — AB (ref 65–99)
POTASSIUM: 3.9 mmol/L (ref 3.5–5.1)
SODIUM: 136 mmol/L (ref 135–145)

## 2017-08-25 LAB — CBC WITH DIFFERENTIAL/PLATELET
BASOS ABS: 0 10*3/uL (ref 0.0–0.1)
Basophils Relative: 0 %
EOS PCT: 0 %
Eosinophils Absolute: 0 10*3/uL (ref 0.0–0.7)
HEMATOCRIT: 36.6 % — AB (ref 39.0–52.0)
Hemoglobin: 12 g/dL — ABNORMAL LOW (ref 13.0–17.0)
LYMPHS PCT: 12 %
Lymphs Abs: 1.1 10*3/uL (ref 0.7–4.0)
MCH: 28.4 pg (ref 26.0–34.0)
MCHC: 32.8 g/dL (ref 30.0–36.0)
MCV: 86.5 fL (ref 78.0–100.0)
MONO ABS: 0.9 10*3/uL (ref 0.1–1.0)
Monocytes Relative: 10 %
NEUTROS ABS: 7.1 10*3/uL (ref 1.7–7.7)
Neutrophils Relative %: 78 %
PLATELETS: 159 10*3/uL (ref 150–400)
RBC: 4.23 MIL/uL (ref 4.22–5.81)
RDW: 14.4 % (ref 11.5–15.5)
WBC: 9 10*3/uL (ref 4.0–10.5)

## 2017-08-25 LAB — GLUCOSE, CAPILLARY: GLUCOSE-CAPILLARY: 103 mg/dL — AB (ref 65–99)

## 2017-08-25 MED ORDER — SODIUM CHLORIDE 0.9 % IV SOLN
750.0000 mg | Freq: Two times a day (BID) | INTRAVENOUS | Status: DC
  Administered 2017-08-25 – 2017-08-27 (×5): 750 mg via INTRAVENOUS
  Filled 2017-08-25 (×7): qty 7.5

## 2017-08-25 MED ORDER — ACETAMINOPHEN 650 MG RE SUPP: 650.0000 mg | Freq: Four times a day (QID) | RECTAL | Status: DC | PRN

## 2017-08-25 MED ORDER — LABETALOL HCL 5 MG/ML IV SOLN
10.0000 mg | INTRAVENOUS | Status: DC | PRN
  Administered 2017-08-25: 10 mg via INTRAVENOUS
  Filled 2017-08-25 (×2): qty 4

## 2017-08-25 MED ORDER — IOPAMIDOL (ISOVUE-370) INJECTION 76%
100.0000 mL | Freq: Once | INTRAVENOUS | Status: AC | PRN
  Administered 2017-08-25: 100 mL via INTRAVENOUS

## 2017-08-25 MED ORDER — ONDANSETRON HCL 4 MG/2ML IJ SOLN: 4.0000 mg | Freq: Four times a day (QID) | INTRAMUSCULAR | Status: DC | PRN

## 2017-08-25 MED ORDER — SODIUM CHLORIDE 0.9% FLUSH
3.0000 mL | Freq: Two times a day (BID) | INTRAVENOUS | Status: DC
  Administered 2017-08-25 – 2017-08-27 (×6): 3 mL via INTRAVENOUS

## 2017-08-25 MED ORDER — ENOXAPARIN SODIUM 40 MG/0.4ML ~~LOC~~ SOLN
40.0000 mg | SUBCUTANEOUS | Status: DC
  Administered 2017-08-25 – 2017-08-26 (×2): 40 mg via SUBCUTANEOUS
  Filled 2017-08-25 (×3): qty 0.4

## 2017-08-25 MED ORDER — HYDROCERIN EX CREA
TOPICAL_CREAM | Freq: Two times a day (BID) | CUTANEOUS | Status: DC
  Administered 2017-08-25: 10:00:00 via TOPICAL
  Administered 2017-08-25: 1 via TOPICAL
  Administered 2017-08-26 – 2017-08-27 (×3): via TOPICAL
  Filled 2017-08-25: qty 113

## 2017-08-25 MED ORDER — IOPAMIDOL (ISOVUE-370) INJECTION 76%
INTRAVENOUS | Status: AC
  Filled 2017-08-25: qty 100

## 2017-08-25 MED ORDER — ONDANSETRON HCL 4 MG PO TABS: 4.0000 mg | ORAL_TABLET | Freq: Four times a day (QID) | ORAL | Status: DC | PRN

## 2017-08-25 MED ORDER — ACETAMINOPHEN 325 MG PO TABS
650.0000 mg | ORAL_TABLET | Freq: Four times a day (QID) | ORAL | Status: DC | PRN
  Administered 2017-08-27: 650 mg via ORAL
  Filled 2017-08-25: qty 2

## 2017-08-25 MED ORDER — SODIUM CHLORIDE 0.45 % IV SOLN
INTRAVENOUS | Status: DC
  Administered 2017-08-25: 02:00:00 via INTRAVENOUS

## 2017-08-25 MED ORDER — SODIUM CHLORIDE 0.9 % IV SOLN
3.0000 g | Freq: Four times a day (QID) | INTRAVENOUS | Status: DC
  Administered 2017-08-25 – 2017-08-26 (×6): 3 g via INTRAVENOUS
  Filled 2017-08-25 (×8): qty 3

## 2017-08-25 MED ORDER — VANCOMYCIN HCL 500 MG IV SOLR
500.0000 mg | Freq: Two times a day (BID) | INTRAVENOUS | Status: DC
  Administered 2017-08-25 – 2017-08-26 (×2): 500 mg via INTRAVENOUS
  Filled 2017-08-25 (×3): qty 500

## 2017-08-25 MED ORDER — VANCOMYCIN HCL IN DEXTROSE 1-5 GM/200ML-% IV SOLN
1000.0000 mg | Freq: Once | INTRAVENOUS | Status: AC
  Administered 2017-08-25: 1000 mg via INTRAVENOUS
  Filled 2017-08-25: qty 200

## 2017-08-25 NOTE — Progress Notes (Addendum)
[facility] G[facility] RN visit at 0900am.  This is a related and covered GIP admission of 08/25/17 with [facility] diagnosis of Alzheimer's dementia, per Dr. Earlie Counts.  Patient has an [place] DNR.  [employer] ([place] activated EMS after [employer] RN went to facility to assess patient.  Patient was having difficulty swallowing medicines/food.  Was pocketing food starting 08/24/17.  Patient with increased coughing and concern for aspiration.  Raising of the bed, and contacting [facility] were the only things tried in the facility.    Due to patient increased coughing, swallowing of medications and food with possible aspiration, wife did not want the patient to be managed in the facility by [facility].  She wanted patient to be transferred to hospital for care.    Patient admitted for sepsis and aspiration pneumonia.   Day 1 of GIP.   Patient being treated with:  0.45 % Sodium Chloride infusion, Rate 100 mL/hr, continuous via IV for hydration; Ampicillin-Sulbactam (UNASYN) 3 g in sodium 0.9 % 100 mL IVPB, Dose 3 g, Q6H via IV for Aspiration Pneumonia; vancomycin (VANCOCIN) 500 mg in sodium chloride 0.9% 100 mL IVPB , Dose 500 mg, Q12H via IV for pneumonia.    O2 therapy at 3L O2 [place].  Patient receiving antibiotic therapy.  Per bedside RN, Olivia Mackie at [phone], patient's vitals have stabilized upon arrived to the floor.  Patient breathing easy and currently sleeping with NAD.  Patient without coughing at this time.  Patient does have head of the bed raised for aspiration precautions.  There is currently no family at bedside with patient.    Dr. Ok Edwards and Dr. Suszanne Conners made aware of patient admission.  Transfer summary and medication list placed on paper chart.    Goals of care:  Currently patient is [place] DNR that was sent with EMS from facility.  Wife had discussed possibly wanting CPR and intubation, depending on circumstances, with [facility] RN at the facility before  transferring to hospital.  [facility] SW, Newman Pies to follow up with patient's wife this morning to go over [place] for clarification.  At this time, the DNR is still in place in the hospital.    Discharge planning:  Once patient is making clinical progress towards resolving the aspiration pneumonia, patient will be discharged back to [facility] SNF. We will continue to follow while hospitalized and anticipate any discharge needs.   If you have any questions, please feel free to contact [facility].   Thank you,   Edyth Gunnels, RN, BSN [facility] Liaison [phone]  All hospital liaisons are now on [place].

## 2017-08-25 NOTE — Progress Notes (Signed)
[name], SW, [facility], at bedside.  Per Oletta Darter, pt's wife wants to change patient's status to full code.  Stated she spoke to the wife earlier.  At present, has order for a DNR.  I spoke with Amy, [facility] RN, early this morning who verified pt was a DNR prior to being admitted.  Dr. Eliseo Squires notified.  [place] spoke with Dr. Eliseo Squires, and she will again, speak with wife to verify code status.

## 2017-08-25 NOTE — Progress Notes (Signed)
SLP Cancellation Note  Patient Details Name: Mike Williams MRN: [id number] DOB: Aug 30, 1932   Cancelled treatment: Patient made NPO by NP due to patient choking on own saliva (Right sided PNA and fever). Unable to arouse patient for evaluation.  Told nurse will evaluate tomorrow. Pt is a Hospice patient from SNF.         Charlynne Cousins Murat Rideout 08/25/2017, 2:16 PM

## 2017-08-25 NOTE — Progress Notes (Signed)
Patient admitted after midnight.  On HOSPICE at a SNF.  Sent to the ER for aspiration.  Seen this AM and is aspirating on his own saliva, requiring suction.  Non-verbal from dementia.  IV abx for PNA.  NPO for SLP evaluation.  Currently DNR but wife apparently waivers quite frequently.  If she wants full, will need PCCM consult as not sure he can protect his airway.  EXTREMELY poor prognosis in the demented, non-verbal, bed bound gentleman aspirating his own saliva.  Eulogio Bear DO

## 2017-08-25 NOTE — Progress Notes (Addendum)
Pharmacy Antibiotic Note  Mike Williams is a 84 y.o. male admitted on 08/24/2017 with aspiration pneumonia.  Pharmacy has been consulted for Unasyn dosing.  Plan: Unayn 3g IV Q6H.  Height: 5\' 7"  (170.2 cm) Weight: 143 lb 1.3 oz (64.9 kg) IBW/kg (Calculated) : 66.1  Temp (24hrs), Avg:101.9 F (38.8 C), Min:101.9 F (38.8 C), Max:101.9 F (38.8 C)  Recent Labs  Lab 08/24/17 2205 08/24/17 2213  WBC 9.0  --   CREATININE 0.96  --   LATICACIDVEN  --  1.49    Estimated Creatinine Clearance: 54.5 mL/min (by C-G formula based on SCr of 0.96 mg/dL).    No Known Allergies   Thank you for allowing pharmacy to be a part of this patient's care.  Wynona Neat, PharmD, BCPS  08/25/2017 12:41 AM    Addendum: Now to broaden w/ vancomycin. Vancomycin 1000mg  x1 then 500mg  IV every 12 hours.  Goal trough 15-20 mcg/mL. VB   3:32 AM

## 2017-08-25 NOTE — H&P (Signed)
History and Physical    Graysyn Bache TKP:[id number] DOB: 1933/04/11 DOA: 08/24/2017  PCP: [name], DO   Patient coming from: SNF   Chief Complaint: Coughing, choking   HPI: Mike Williams is a 84 y.o. male with medical history significant for advanced dementia, history of CVA, seizure disorder, hypertension, and dysphasia, now presenting from his nursing facility for evaluation of coughing.  Patient is accompanied by his wife who provides much of the history.  Patient has dysphasia and is given a dysphasia diet at the nursing facility, but was noted to develop a coughing fit while eating a couple days ago.  He had a mild cough 2 days ago but has been coughing constantly today.  Patient is unable to report any complaints due to his advanced dementia.  He may have had some choking again while eating today at the SNF.  At the request of the patient's wife, he was sent to the emergency department for evaluation of this persistent cough.  He has not been expectorating.  No vomiting reported.  ED Course: Upon arrival to the ED, patient is found to be febrile to 38.8 C, hypertensive to 180/100, and vitals otherwise normal.  Chest x-ray features of widening of the mediastinum and no lobar consolidation.  Chemistry panel and CBC are unremarkable, INR is normal, lactic acid is reassuringly normal, and urinalysis features of large leukocytes but no bacteria are seen.  Blood and urine cultures were collected in the ED, 1 L of lactated Ringer's administered, and the patient was treated with empiric Zosyn.  He developed hypoxia in the emergency department and was started on supplemental oxygen via nasal cannula.  He remains hypertensive.  He will be observed in the telemetry unit for ongoing evaluation and management of suspected aspiration.  Review of Systems:  All other systems reviewed and apart from HPI, are negative.  Past Medical History:  Diagnosis Date  . Aortic insufficiency 07/04/2011   2/13 Study  Conclusions  - Left ventricle: Systolic function was normal. The estimated ejection fraction was in the range of 55% to 60%. - Aortic valve: Moderate regurgitation. - Aorta: The aorta was moderately dilated. Transthoracic echocardiography. M-mode, complete 2D, spectral Doppler, and color Doppler. Height: Height: 170.2cm. Height: 67in. Weight: Weight: 84.1kg. Weight: 185lb. Body mass index: BMI: 29kg/m^2. Body surface area: BSA: 2.55m^2. Blood pressure: 175/86. Patient status: Inpatient. Location: Bedside.      . Aortic root dilatation (HCC)    Noted on 09/2010 echo, with mild-mod AI  . CEREBROVASCULAR ACCIDENT, HX OF 02/09/2007  . DEMENTIA 05/12/2007  . Hemorrhoids   . HYPERGLYCEMIA 09/21/2009  . HYPERTENSION 12/29/2006  . Lactose intolerance   . OBSTRUCTIVE SLEEP APNEA 12/29/2006  . PROSTATE CANCER, HX OF 12/29/2006  . Prostatitis   . Risk for falls   . Seizures ([place])     Past Surgical History:  Procedure Laterality Date  . CHOLECYSTECTOMY    . GANGLION CYST EXCISION    . PROSTATE SURGERY     cryotherapy     reports that he has never smoked. He has never used smokeless tobacco. He reports that he does not drink alcohol or use drugs.  No Known Allergies  Family History  Problem Relation Age of Onset  . Heart attack Father   . Heart disease Father   . Cancer Sister      Prior to Admission medications   Medication Sig Start Date End Date Taking? Authorizing Provider  acetaminophen (TYLENOL) 500 MG tablet Take 1,000 mg by  mouth every 6 (six) hours as needed (pain).    Yes [provider]  aspirin 81 MG chewable tablet Chew 81 mg by mouth daily. 06/17/17  Yes [provider]  betamethasone dipropionate (DIPROLENE) 0.05 % cream Apply 1 application topically daily. Apply to Groin area   Yes [provider]  Emollient (EUCERIN) lotion Apply topically 2 (two) times daily. For dry skin   Yes [provider]  escitalopram (LEXAPRO) 5 MG tablet Take 5 mg by  mouth daily. For depression    Yes [provider]  felodipine (PLENDIL) 10 MG 24 hr tablet Take 10 mg by mouth daily.   Yes [provider]  hydrALAZINE (APRESOLINE) 100 MG tablet Take 100 mg by mouth 3 (three) times daily.   Yes [provider]  levETIRAcetam (KEPPRA) 750 MG tablet Take 750 mg by mouth 2 (two) times daily.   Yes [provider]  olmesartan (BENICAR) 40 MG tablet Take 40 mg by mouth daily.   Yes [provider]    Physical Exam: Vitals:   08/24/17 2315 08/24/17 2330 08/24/17 2345 08/25/17 0000  BP: (!) 169/92 (!) 175/102    Pulse: 88 85    Resp: (!) 21 20    Temp:      TempSrc:      SpO2: 98% 98% (!) 86% (!) 84%      Constitutional: No acute respiratory distress, coughing  Eyes: PERTLA, lids and conjunctivae normal ENMT: Mucous membranes are dry. Posterior pharynx clear of any exudate or lesions.   Neck: normal, supple, no masses, no thyromegaly Respiratory: Coarse rhonchi at bilateral bases. No accessory muscle use.  Cardiovascular: Rate ~100 and irregular. No extremity edema.   Abdomen: No distension, no tenderness, soft. Bowel sounds active.  Musculoskeletal: no clubbing / cyanosis. No joint deformity upper and lower extremities.   Skin: no significant rashes, lesions, ulcers. Warm, dry, well-perfused. Neurologic: No facial asymmetry. Moving all extremities. No verbal response, makes eye-contact.  Psychiatric: Appears alert, but not speaking.      Labs on Admission: I have personally reviewed following labs and imaging studies  CBC: Recent Labs  Lab 08/24/17 2205  WBC 9.0  NEUTROABS 6.8  HGB 14.3  HCT 42.5  MCV 86.6  PLT 371   Basic Metabolic Panel: Recent Labs  Lab 08/24/17 2205  NA 138  K 4.4  CL 102  CO2 25  GLUCOSE 125*  BUN 11  CREATININE 0.96  CALCIUM 9.4   GFR: CrCl cannot be calculated (Unknown ideal weight.). Liver Function Tests: Recent Labs  Lab 08/24/17 2205  AST 22  ALT  15*  ALKPHOS 104  BILITOT 0.8  PROT 7.8  ALBUMIN 3.8   No results for input(s): LIPASE, AMYLASE in the last 168 hours. No results for input(s): AMMONIA in the last 168 hours. Coagulation Profile: Recent Labs  Lab 08/24/17 2205  INR 1.04   Cardiac Enzymes: No results for input(s): CKTOTAL, CKMB, CKMBINDEX, TROPONINI in the last 168 hours. BNP (last 3 results) No results for input(s): PROBNP in the last 8760 hours. HbA1C: No results for input(s): HGBA1C in the last 72 hours. CBG: No results for input(s): GLUCAP in the last 168 hours. Lipid Profile: No results for input(s): CHOL, HDL, LDLCALC, TRIG, CHOLHDL, LDLDIRECT in the last 72 hours. Thyroid Function Tests: No results for input(s): TSH, T4TOTAL, FREET4, T3FREE, THYROIDAB in the last 72 hours. Anemia Panel: No results for input(s): VITAMINB12, FOLATE, FERRITIN, TIBC, IRON, RETICCTPCT in the last 72 hours.  Urine analysis:    Component Value Date/Time   COLORURINE YELLOW 08/24/2017 2211   APPEARANCEUR HAZY (A) 08/24/2017 2211   LABSPEC 1.024 08/24/2017 2211   PHURINE 5.0 08/24/2017 2211   GLUCOSEU NEGATIVE 08/24/2017 2211   HGBUR NEGATIVE 08/24/2017 2211   BILIRUBINUR NEGATIVE 08/24/2017 2211   BILIRUBINUR n 01/08/2012 1122   KETONESUR 5 (A) 08/24/2017 2211   PROTEINUR 100 (A) 08/24/2017 2211   UROBILINOGEN 1.0 09/23/2014 1245   NITRITE NEGATIVE 08/24/2017 2211   LEUKOCYTESUR LARGE (A) 08/24/2017 2211   Sepsis Labs: @LABRCNTIP (procalcitonin:4,lacticidven:4) )No results found for this or any previous visit (from the past 240 hour(s)).   Radiological Exams on Admission: Dg Chest Portable 1 View  Result Date: 08/24/2017 CLINICAL DATA:  Cough.  Dysphagia. EXAM: PORTABLE CHEST 1 VIEW COMPARISON:  06/02/2017 FINDINGS: Mild enlargement of the cardiac silhouette. Apparent widening of the mediastinum. Right paratracheal thickening. Tortuosity and calcific atherosclerotic disease of the aorta. Elevation of the left  hemidiaphragm. There is no evidence of focal airspace consolidation, pleural effusion or pneumothorax. Osseous structures are without acute abnormality. Soft tissues are grossly normal. IMPRESSION: Apparent widening of the mediastinum. This may be due to ectatic ascending and descending thoracic aorta, however aneurysmal dilation of the aorta cannot be excluded. If found clinically appropriate, CT angiogram of the chest may be used for further evaluation. No evidence of lobar consolidation. Electronically Signed   By: Fidela Salisbury [name]   On: 08/24/2017 22:14    EKG: Ordered, but not yet performed.   Assessment/Plan   1. Aspiration; acute hypoxic respiratory failure; dysphagia   - Presents with coughing and fevers; has known dysphagia on dysphagia diet, but reportedly choking with meal  - CXR with possible mediastinal widening, CT pending  - He is febrile on arrival and hypoxic in mid-80's without leukocytosis or elevation in lactic acid  - Blood cultures collected in ED and empiric Zosyn given  - Continue abx with Unasyn, keep NPO for now, request SLP eval   2. Dementia  - Advanced  - Continue supportive care    3. Hypertension with hypertensive urgency  - BP 180/100 in ED  - Use labetalol IVP's for now while he is NPO    4. Seizure disorder  - No seizure-like activity in ED  - Continue Keppra, will give IV while NPO    5. ?Mediastinal widening on CXR  - Apparent mediastinal widening noted on CXR  - If this were to represent a dissection, pt would be a poor surgical candidate, but would be helpful for prognostic purposes and for guiding current treatment, so will proceed with CT angio    DVT prophylaxis: Lovenox Code Status: DNR  Family Communication: Wife updated at bedside Consults called: None Admission status: Observation    Vianne Bulls, MD [facility] Pager [phone]  If 7PM-7AM, please contact night-coverage [email or web address] Password  [email or web address]  08/25/2017, 12:30 AM

## 2017-08-25 NOTE — Progress Notes (Signed)
Pharmacy notified of missing medication

## 2017-08-25 NOTE — ED Notes (Signed)
Hospitalist at bedside 

## 2017-08-25 NOTE — ED Notes (Signed)
Nurse will collect 2nd set blood culture.

## 2017-08-25 NOTE — Progress Notes (Signed)
[facility] Liaison:  RN  I was able to speak to wife, [name], at length.  She had been struggling over DNR/DNI vs. FULL CODE.  She requested that I explain to her what CPR/Intubation would look like in a patient.  I went over this scenario with her, and it was clear that this was very hard for her and that she had not been aware of all components of FULL code treatment.  Wife explained she does not want patient to suffer.  She would like comfort care for her husband.  While she wants to treat the treatable, she does not want to include CPR or intubation.  She wants to focus on Quality of Life vs. Prolonging of Life.    I called and spoke with Abagail Kitchens, [facility] CSW, about this also.  Thank you,  Edyth Gunnels, RN, BSN [facility] Liaison [phone]  All hospital liaisons are now on [place].

## 2017-08-26 ENCOUNTER — Other Ambulatory Visit: Payer: Self-pay

## 2017-08-26 ENCOUNTER — Encounter (HOSPITAL_COMMUNITY): Payer: Self-pay | Admitting: General Practice

## 2017-08-26 LAB — BLOOD CULTURE ID PANEL (REFLEXED)
Acinetobacter baumannii: NOT DETECTED
CANDIDA PARAPSILOSIS: NOT DETECTED
Candida albicans: NOT DETECTED
Candida glabrata: NOT DETECTED
Candida krusei: NOT DETECTED
Candida tropicalis: NOT DETECTED
Enterobacter cloacae complex: NOT DETECTED
Enterobacteriaceae species: NOT DETECTED
Enterococcus species: NOT DETECTED
Escherichia coli: NOT DETECTED
HAEMOPHILUS INFLUENZAE: NOT DETECTED
KLEBSIELLA OXYTOCA: NOT DETECTED
Klebsiella pneumoniae: NOT DETECTED
Listeria monocytogenes: NOT DETECTED
Neisseria meningitidis: NOT DETECTED
Proteus species: NOT DETECTED
Pseudomonas aeruginosa: NOT DETECTED
SERRATIA MARCESCENS: NOT DETECTED
STAPHYLOCOCCUS AUREUS BCID: NOT DETECTED
STAPHYLOCOCCUS SPECIES: NOT DETECTED
STREPTOCOCCUS PYOGENES: NOT DETECTED
STREPTOCOCCUS SPECIES: NOT DETECTED
Streptococcus agalactiae: NOT DETECTED
Streptococcus pneumoniae: NOT DETECTED

## 2017-08-26 LAB — GLUCOSE, CAPILLARY: Glucose-Capillary: 74 mg/dL (ref 65–99)

## 2017-08-26 MED ORDER — IRBESARTAN 300 MG PO TABS
300.0000 mg | ORAL_TABLET | Freq: Every day | ORAL | Status: DC
  Administered 2017-08-26 – 2017-08-27 (×2): 300 mg via ORAL
  Filled 2017-08-26 (×2): qty 1

## 2017-08-26 MED ORDER — ASPIRIN 81 MG PO CHEW
81.0000 mg | CHEWABLE_TABLET | Freq: Every day | ORAL | Status: DC
  Administered 2017-08-26 – 2017-08-27 (×2): 81 mg via ORAL
  Filled 2017-08-26 (×2): qty 1

## 2017-08-26 MED ORDER — FELODIPINE ER 10 MG PO TB24
10.0000 mg | ORAL_TABLET | Freq: Every day | ORAL | Status: DC
  Administered 2017-08-26 – 2017-08-27 (×2): 10 mg via ORAL
  Filled 2017-08-26 (×2): qty 1

## 2017-08-26 MED ORDER — AMOXICILLIN-POT CLAVULANATE 875-125 MG PO TABS
1.0000 | ORAL_TABLET | Freq: Two times a day (BID) | ORAL | Status: DC
  Administered 2017-08-26 – 2017-08-27 (×3): 1 via ORAL
  Filled 2017-08-26 (×3): qty 1

## 2017-08-26 MED ORDER — HYDRALAZINE HCL 50 MG PO TABS
100.0000 mg | ORAL_TABLET | Freq: Three times a day (TID) | ORAL | Status: DC
  Administered 2017-08-26 – 2017-08-27 (×3): 100 mg via ORAL
  Filled 2017-08-26 (×3): qty 2

## 2017-08-26 MED ORDER — ESCITALOPRAM OXALATE 10 MG PO TABS
5.0000 mg | ORAL_TABLET | Freq: Every day | ORAL | Status: DC
  Administered 2017-08-26 – 2017-08-27 (×2): 5 mg via ORAL
  Filled 2017-08-26 (×2): qty 1

## 2017-08-26 MED ORDER — RESOURCE THICKENUP CLEAR PO POWD
Freq: Once | ORAL | Status: AC
  Administered 2017-08-26: 14:00:00 via ORAL
  Filled 2017-08-26: qty 125

## 2017-08-26 NOTE — Evaluation (Signed)
Clinical/Bedside Swallow Evaluation Patient Details  Name: Mike Williams MRN: [id number] Date of Birth: [birth year]  Today's Date: 08/26/2017 Time: SLP Start Time (ACUTE ONLY): 1032 SLP Stop Time (ACUTE ONLY): 1050 SLP Time Calculation (min) (ACUTE ONLY): 18 min  Past Medical History:  Past Medical History:  Diagnosis Date  . Aortic insufficiency 07/04/2011   2/13 Study Conclusions  - Left ventricle: Systolic function was normal. The estimated ejection fraction was in the range of 55% to 60%. - Aortic valve: Moderate regurgitation. - Aorta: The aorta was moderately dilated. Transthoracic echocardiography. M-mode, complete 2D, spectral Doppler, and color Doppler. Height: Height: 170.2cm. Height: 67in. Weight: Weight: 84.1kg. Weight: 185lb. Body mass index: BMI: 29kg/m^2. Body surface area: BSA: 2.29m^2. Blood pressure: 175/86. Patient status: Inpatient. Location: Bedside.      . Aortic root dilatation (HCC)    Noted on 09/2010 echo, with mild-mod AI  . CEREBROVASCULAR ACCIDENT, HX OF 02/09/2007  . DEMENTIA 05/12/2007  . Hemorrhoids   . HYPERGLYCEMIA 09/21/2009  . HYPERTENSION 12/29/2006  . Lactose intolerance   . OBSTRUCTIVE SLEEP APNEA 12/29/2006  . PROSTATE CANCER, HX OF 12/29/2006  . Prostatitis   . Risk for falls   . Seizures ([place])    Past Surgical History:  Past Surgical History:  Procedure Laterality Date  . CHOLECYSTECTOMY    . GANGLION CYST EXCISION    . PROSTATE SURGERY     cryotherapy   HPI:   84 y.o. male with medical history significant for advanced dementia, history of CVA, seizure disorder, hypertension, and dysphasia, now presenting from his nursing facility due to overt swallowing difficulty and likely aspiration.  Pt with known dysphagia; on dysphagia diet.  Last seen by SLP services during Jan 2019 admission, at which time dys1/nectar diet was recommended due to dementia-related dysphagia.  Current dx sepsis and aspiration pna. Pt is being followed by hospice; his wife would  like to pursue comfort care, but treat the treatable; no CPR/intubation.    Assessment / Plan / Recommendation Clinical Impression  Pt presents with quite functional swallow today.  He was alert, anticipated arrival of cup/spoon to lips, actively masticated with good oral control, initiated a swallow response with no overt s/s of aspiration associated with purees or nectar-thick liquids.  He did cough after drinking thin liquids.  Pt was able to hold cup and drink with physical support under left elbow.  He nodded his head several times, making eye contact with examiner, and responded "[name]" when asked his name.  Recommend resuming a dysphagia 1 diet, nectar thick liquids, meds crushed in puree, with understanding that pt is still inherently at risk for aspiration given his advanced dementia.  No further acute SLP needs - our services will respectfully sign off. SLP Visit Diagnosis: Dysphagia, unspecified (R13.10)    Aspiration Risk  Mild aspiration risk    Diet Recommendation   dys 1 nectar Medication Administration: Crushed with puree    Other  Recommendations Oral Care Recommendations: Oral care BID   Follow up Recommendations        Frequency and Duration            Prognosis        Swallow Study   General HPI:  84 y.o. male with medical history significant for advanced dementia, history of CVA, seizure disorder, hypertension, and dysphasia, now presenting from his nursing facility due to overt swallowing difficulty and likely aspiration.  Pt with known dysphagia; on dysphagia diet.  Last seen by SLP services during Jan  2019 admission, at which time dys1/nectar diet was recommended due to dementia-related dysphagia.  Current dx sepsis and aspiration pna. Pt is being followed by hospice; his wife would like to pursue comfort care, but treat the treatable; no CPR/intubation.  Type of Study: Bedside Swallow Evaluation Previous Swallow Assessment: see HPI Diet Prior to this Study:  NPO Temperature Spikes Noted: Yes Respiratory Status: Nasal cannula History of Recent Intubation: No Behavior/Cognition: Alert;Confused Oral Cavity Assessment: Within Functional Limits Oral Care Completed by SLP: Recent completion by staff Oral Cavity - Dentition: Adequate natural dentition Vision: Functional for self-feeding Self-Feeding Abilities: Able to feed self Patient Positioning: Upright in bed Baseline Vocal Quality: Normal Volitional Cough: Cognitively unable to elicit Volitional Swallow: Unable to elicit    Oral/Motor/Sensory Function Overall Oral Motor/Sensory Function: Within functional limits   Ice Chips Ice chips: Not tested   Thin Liquid Thin Liquid: Impaired Presentation: Cup Pharyngeal  Phase Impairments: Cough - Immediate    Nectar Thick Nectar Thick Liquid: Within functional limits   Honey Thick Honey Thick Liquid: Not tested   Puree Puree: Within functional limits Presentation: Spoon   Solid   GO   Solid: Not tested        [name] 08/26/2017,10:56 AM

## 2017-08-26 NOTE — Progress Notes (Addendum)
[facility] G[facility] RN visit at 1115am.  This is a related and covered GIP admission of 08/25/17 with [facility] diagnosis of Alzheimer's dementia, per Dr. Earlie Counts.  Patient has an [place] DNR.  [employer] ([place]) activated EMS after [employer] RN went to facility to assess patient.  Patient was having difficulty swallowing medicines/food.  Was pocketing food starting 08/24/17.  Patient with increased coughing and concern for aspiration.  Patient admitted for sepsis and aspiration pneumonia.   Day 2 of GIP.   Patient being treated with:  amoxicillin-clavulanate (AUGMENTIN) 875-125 MG per tablet 1 tablet, Dose 1 tablet, Q12H via PO; enoxaparin (LOVENOX) injection 40 mg, Dose 40 mg, Q24H via [place]; RESOURCE THICKENUP CLEAR; levETIRAcetam (KEPPRA) 750 mg in sodium chloride 0.9% 100 mL IVPB, Dose 750 mg, Q12H, via IV.  No PRN meds given today.    O2 therapy at 3L O2 [place].  Patient receiving antibiotic therapy (see above).  Per bedside RN, Shaun, patient was evaluated by Speech today and did swallow study.  Patient did well and they recommend Dys 1 diet with nectar thick liquids.  2 bedside RN's in the room when I arrived for visit.  They were suctioning patient and patient was having some difficulty with coughing and getting secretions up.  Patient was better after suctioning.  Patient did not talk to me when I asked questions, he did track me around the room and was paying attention when I would talk to him.  Patient currently has dark yellow urine output in catheter bag.    Goals of care:  Patient is DNR.  I spoke with [name], wife, yesterday regarding code status and her plans for patient care.  We went through scenarios of patient's declining health and she was at peace with leaving the patient DNR/DNI.   She advised that she does not want patient to suffer and would prefer patient to be comfortable.    Discharge planning:  Once patient is making clinical  progress towards resolving the aspiration pneumonia, patient will be discharged back to [facility]).  I spoke with Dr. Eliseo Squires, she advised that they will be getting diet and PO medications on board today and see how patient does.  If patient does well, possible discharge back to the facility tomorrow.  We will continue to follow while hospitalized and anticipate any discharge needs.   If you have any questions, please feel free to contact [facility].   Thank you,   Edyth Gunnels, RN, BSN [facility] Liaison [phone]  All hospital liaisons are now on [place].

## 2017-08-26 NOTE — Progress Notes (Signed)
Called wife to update, no answer and mail box full Eulogio Bear DO

## 2017-08-26 NOTE — Progress Notes (Signed)
PHARMACY - PHYSICIAN COMMUNICATION CRITICAL VALUE ALERT - BLOOD CULTURE IDENTIFICATION (BCID)  Neil Brickell is an 84 y.o. male who presented to [facility] on 08/24/2017  Assessment:  Blood cultures from 4/22 with gram + cocci in clusters, BCID with NO detection  Name of physician (or Provider) Contacted: Tylene Fantasia ([facility])  Current antibiotics: Vancomycin/Unasyn  Changes to prescribed antibiotics recommended:  No changes for now   Narda Bonds 08/26/2017  12:06 AM

## 2017-08-26 NOTE — Progress Notes (Signed)
PROGRESS NOTE    Mike Williams  WJX:[id number] DOB: 1932/11/10 DOA: 08/24/2017 PCP: [name], DO   Outpatient Specialists:    Brief Narrative:  Mike Williams is a 85 y.o. male with medical history significant for advanced dementia, history of CVA, seizure disorder, hypertension, and dysphasia, now presenting from his nursing facility for evaluation of coughing.   Patient has dysphasia and is given a dysphasia diet at the nursing facility, but was noted to develop a coughing fit while eating a couple days ago.  He had a mild cough 2 days ago but has been coughing constantly today.  Patient is unable to report any complaints due to his advanced dementia.  Found on imaging to have an aspiration pneumonia. Patient is on hospice     Assessment & Plan:   Principal Problem:   Aspiration into respiratory tract Active Problems:   Alzheimer's dementia   Essential hypertension   Seizures (HCC)   Acute respiratory failure with hypoxia (HCC)   Hypertensive urgency   Aspiration into airway   Mediastinal widening   Acute pulmonary edema (HCC)   Aspiration PNA causing acute hypoxic respiratory failure from dysphagia   - seen by SLP and plan for DYS 1, nectar thick-- meds crushed in puree -known aspiration risk -on hospice -no further fever -de-escalate abx to augmentin  1/2 gram + cocci in blood cultures -discussed with pharmacy, most likely is a contaminant   Dementia  - Advanced  - Continue supportive care   -is on hospice  Hypertension  -resume home meds and monitor  Seizure disorder  - No seizure-like activity in ED  - Continue Keppra- change to Po in AM if tolerated feeding today        DVT prophylaxis:  Lovenox   Code Status: DNR   Family Communication: Called wife, no answer  Disposition Plan:  Back to SNF with hospice in AM if no fever and tolerates diet   Consultants:  SLP  Subjective: More awake today, was able to work with SLP for a diet---  known aspiration  Objective: Vitals:   08/25/17 2356 08/26/17 0418 08/26/17 0828 08/26/17 1225  BP: 129/71 (!) 165/80 (!) 171/65 (!) 168/78  Pulse: 60 (!) 56 (!) 53 (!) 58  Resp: 18 16 18 18   Temp: 98.4 F (36.9 C) 98.6 F (37 C) 98.7 F (37.1 C) 98.6 F (37 C)  TempSrc: Oral Oral Oral Oral  SpO2: 100% 100% 100% 100%  Weight:      Height:        Intake/Output Summary (Last 24 hours) at 08/26/2017 1400 Last data filed at 08/26/2017 0500 Gross per 24 hour  Intake 618 ml  Output 575 ml  Net 43 ml   Filed Weights   08/25/17 0000  Weight: 64.9 kg (143 lb 1.3 oz)    Examination:  General exam: more awake and interactive today-- says a few words Respiratory system: Coarse breath sounds Cardiovascular system: rrr Gastrointestinal system: +Bs, soft Central nervous system: Awake, follows simple commands     Data Reviewed: I have personally reviewed following labs and imaging studies  CBC: Recent Labs  Lab 08/24/17 2205 08/25/17 0223  WBC 9.0 9.0  NEUTROABS 6.8 7.1  HGB 14.3 12.0*  HCT 42.5 36.6*  MCV 86.6 86.5  PLT 191 213   Basic Metabolic Panel: Recent Labs  Lab 08/24/17 2205 08/25/17 0223  NA 138 136  K 4.4 3.9  CL 102 103  CO2 25 23  GLUCOSE 125* 111*  BUN  11 10  CREATININE 0.96 0.91  CALCIUM 9.4 8.3*   GFR: Estimated Creatinine Clearance: 57.5 mL/min (by C-G formula based on SCr of 0.91 mg/dL). Liver Function Tests: Recent Labs  Lab 08/24/17 2205  AST 22  ALT 15*  ALKPHOS 104  BILITOT 0.8  PROT 7.8  ALBUMIN 3.8   No results for input(s): LIPASE, AMYLASE in the last 168 hours. No results for input(s): AMMONIA in the last 168 hours. Coagulation Profile: Recent Labs  Lab 08/24/17 2205  INR 1.04   Cardiac Enzymes: No results for input(s): CKTOTAL, CKMB, CKMBINDEX, TROPONINI in the last 168 hours. BNP (last 3 results) No results for input(s): PROBNP in the last 8760 hours. HbA1C: No results for input(s): HGBA1C in the last 72  hours. CBG: Recent Labs  Lab 08/25/17 1749 08/26/17 0616  GLUCAP 103* 74   Lipid Profile: No results for input(s): CHOL, HDL, LDLCALC, TRIG, CHOLHDL, LDLDIRECT in the last 72 hours. Thyroid Function Tests: No results for input(s): TSH, T4TOTAL, FREET4, T3FREE, THYROIDAB in the last 72 hours. Anemia Panel: No results for input(s): VITAMINB12, FOLATE, FERRITIN, TIBC, IRON, RETICCTPCT in the last 72 hours. Urine analysis:    Component Value Date/Time   COLORURINE YELLOW 08/24/2017 2211   APPEARANCEUR HAZY (A) 08/24/2017 2211   LABSPEC 1.024 08/24/2017 2211   PHURINE 5.0 08/24/2017 2211   GLUCOSEU NEGATIVE 08/24/2017 2211   HGBUR NEGATIVE 08/24/2017 2211   BILIRUBINUR NEGATIVE 08/24/2017 2211   BILIRUBINUR n 01/08/2012 1122   KETONESUR 5 (A) 08/24/2017 2211   PROTEINUR 100 (A) 08/24/2017 2211   UROBILINOGEN 1.0 09/23/2014 1245   NITRITE NEGATIVE 08/24/2017 2211   LEUKOCYTESUR LARGE (A) 08/24/2017 2211    ) Recent Results (from the past 240 hour(s))  Culture, blood (Routine x 2)     Status: None (Preliminary result)   Collection Time: 08/24/17  9:55 PM  Result Value Ref Range Status   Specimen Description BLOOD LEFT HAND  Final   Special Requests   Final    BOTTLES DRAWN AEROBIC AND ANAEROBIC Blood Culture results may not be optimal due to an inadequate volume of blood received in culture bottles   Culture  Setup Time   Final    GRAM POSITIVE COCCI IN CLUSTERS IN BOTH AEROBIC AND ANAEROBIC BOTTLES CRITICAL RESULT CALLED TO, READ BACK BY AND VERIFIED WITH: J [facility] [employer] [facility] 08/25/17 2345 JDW    Culture   Final    GRAM POSITIVE COCCI CULTURE REINCUBATED FOR BETTER GROWTH Performed at [facility], [place] [address] [place], [place] [address]    Report Status PENDING  Incomplete  Blood Culture ID Panel (Reflexed)     Status: None   Collection Time: 08/24/17  9:55 PM  Result Value Ref Range Status   Enterococcus species NOT DETECTED NOT DETECTED Final   Listeria  monocytogenes NOT DETECTED NOT DETECTED Final   Staphylococcus species NOT DETECTED NOT DETECTED Final   Staphylococcus aureus NOT DETECTED NOT DETECTED Final    Comment: CRITICAL RESULT CALLED TO, READ BACK BY AND VERIFIED WITH: J [facility] 08/25/17 2345 JDW    Streptococcus species NOT DETECTED NOT DETECTED Final   Streptococcus agalactiae NOT DETECTED NOT DETECTED Final   Streptococcus pneumoniae NOT DETECTED NOT DETECTED Final   Streptococcus pyogenes NOT DETECTED NOT DETECTED Final   Acinetobacter baumannii NOT DETECTED NOT DETECTED Final   Enterobacteriaceae species NOT DETECTED NOT DETECTED Final   Enterobacter cloacae complex NOT DETECTED NOT DETECTED Final   Escherichia coli NOT DETECTED  NOT DETECTED Final   Klebsiella oxytoca NOT DETECTED NOT DETECTED Final   Klebsiella pneumoniae NOT DETECTED NOT DETECTED Final   Proteus species NOT DETECTED NOT DETECTED Final   Serratia marcescens NOT DETECTED NOT DETECTED Final   Haemophilus influenzae NOT DETECTED NOT DETECTED Final   Neisseria meningitidis NOT DETECTED NOT DETECTED Final   Pseudomonas aeruginosa NOT DETECTED NOT DETECTED Final   Candida albicans NOT DETECTED NOT DETECTED Final   Candida glabrata NOT DETECTED NOT DETECTED Final   Candida krusei NOT DETECTED NOT DETECTED Final   Candida parapsilosis NOT DETECTED NOT DETECTED Final   Candida tropicalis NOT DETECTED NOT DETECTED Final    Comment: Performed at [facility], [place] [address] [place], [place] [address]  Urine culture     Status: Abnormal (Preliminary result)   Collection Time: 08/24/17 10:00 PM  Result Value Ref Range Status   Specimen Description URINE, CATHETERIZED  Final   Special Requests   Final    NONE Performed at [facility], [place] [address], [place], [place] [address]    Culture 40,000 COLONIES/mL KLEBSIELLA PNEUMONIAE (A)  Final   Report Status PENDING  Incomplete      Anti-infectives (From admission, onward)   Start      Dose/Rate Route Frequency Ordered Stop   08/26/17 1200  amoxicillin-clavulanate (AUGMENTIN) 875-125 MG per tablet 1 tablet     1 tablet Oral Every 12 hours 08/26/17 1149     08/25/17 1600  vancomycin (VANCOCIN) 500 mg in sodium chloride 0.9 % 100 mL IVPB  Status:  Discontinued     500 mg 100 mL/hr over 60 Minutes Intravenous Every 12 hours 08/25/17 0332 08/26/17 1149   08/25/17 0400  Ampicillin-Sulbactam (UNASYN) 3 g in sodium chloride 0.9 % 100 mL IVPB  Status:  Discontinued     3 g 200 mL/hr over 30 Minutes Intravenous Every 6 hours 08/25/17 0043 08/26/17 1149   08/25/17 0345  vancomycin (VANCOCIN) IVPB 1000 mg/200 mL premix     1,000 mg 200 mL/hr over 60 Minutes Intravenous  Once 08/25/17 0332 08/25/17 0455   08/24/17 2230  piperacillin-tazobactam (ZOSYN) IVPB 3.375 g     3.375 g 100 mL/hr over 30 Minutes Intravenous STAT 08/24/17 2220 08/24/17 2305       Radiology Studies: Dg Chest Portable 1 View  Result Date: 08/24/2017 CLINICAL DATA:  Cough.  Dysphagia. EXAM: PORTABLE CHEST 1 VIEW COMPARISON:  06/02/2017 FINDINGS: Mild enlargement of the cardiac silhouette. Apparent widening of the mediastinum. Right paratracheal thickening. Tortuosity and calcific atherosclerotic disease of the aorta. Elevation of the left hemidiaphragm. There is no evidence of focal airspace consolidation, pleural effusion or pneumothorax. Osseous structures are without acute abnormality. Soft tissues are grossly normal. IMPRESSION: Apparent widening of the mediastinum. This may be due to ectatic ascending and descending thoracic aorta, however aneurysmal dilation of the aorta cannot be excluded. If found clinically appropriate, CT angiogram of the chest may be used for further evaluation. No evidence of lobar consolidation. Electronically Signed   By: Fidela Salisbury [name]   On: 08/24/2017 22:14   Ct Angio Chest/abd/pel For Dissection W And/or W/wo  Result Date: 08/25/2017 CLINICAL DATA:  Acute onset of cough.   Mediastinal widening. EXAM: CT ANGIOGRAPHY CHEST, ABDOMEN AND PELVIS TECHNIQUE: Multidetector CT imaging through the chest, abdomen and pelvis was performed using the standard protocol during bolus administration of intravenous contrast. Multiplanar reconstructed images and MIPs were obtained and reviewed to evaluate the vascular anatomy. CONTRAST:  117mL ISOVUE-370 IOPAMIDOL (ISOVUE-370) INJECTION 76% COMPARISON:  None. FINDINGS: CTA CHEST FINDINGS Cardiovascular: There is aneurysmal dilatation of the aortic root to 5.1 cm in AP dimension. Aneurysmal dilatation persists at the aortic arch, measuring up to 4.1 cm in diameter. The great vessels are grossly unremarkable in appearance. There is no evidence of aortic dissection. There is no evidence of significant pulmonary embolus. The heart is normal in size. Mild calcification is noted at the aortic arch. Mediastinum/Nodes: A 2.5 cm anterior mediastinal mass is noted, of soft tissue density and relatively well-circumscribed in appearance. Given its appearance, this could reflect a thymoma. Scattered coronary artery calcifications are seen. No mediastinal lymphadenopathy is seen. No pericardial effusion is identified. The visualized portions of the thyroid gland are unremarkable. No axillary lymphadenopathy is seen. Lungs/Pleura: Patchy right-sided airspace opacification raises concern for pneumonia. Mild bibasilar atelectasis is noted. No pleural effusion or pneumothorax is seen. No masses are identified. Musculoskeletal: No acute osseous abnormalities are identified. The visualized musculature is unremarkable in appearance. Review of the MIP images confirms the above findings. CTA ABDOMEN AND PELVIS FINDINGS VASCULAR Aorta: There is mild aneurysmal dilatation of the infrarenal abdominal aorta to 3.1 cm in maximal transverse dimension. There is no evidence of aortic dissection. Scattered calcification is seen along the distal abdominal aorta. Celiac: The celiac trunk  is unremarkable in appearance. SMA: The superior mesenteric artery remains patent. Renals: The renal arteries appear patent bilaterally. IMA: The inferior mesenteric artery is unremarkable in appearance. Inflow: There appear to be penetrating aortic ulcerations along the common iliac arteries bilaterally, measuring 0.8 x 0.3 cm on the right and 1.8 x 0.6 cm on the left. The common iliac arteries and their branches are somewhat ectatic. Veins: Visualized venous structures are grossly unremarkable in appearance. Review of the MIP images confirms the above findings. NON-VASCULAR Hepatobiliary: The liver is unremarkable in appearance. The gallbladder is unremarkable in appearance. The common bile duct remains normal in caliber. Pancreas: The pancreas is within normal limits. Spleen: The spleen is unremarkable in appearance. Adrenals/Urinary Tract: The adrenal glands are unremarkable in appearance. A left renal cyst is noted. Mild left-sided perinephric stranding is noted. There is no evidence of hydronephrosis. No renal or ureteral stones are identified. Stomach/Bowel: The stomach is unremarkable in appearance. The small bowel is within normal limits. The appendix is not visualized; there is no evidence for appendicitis. The colon is unremarkable in appearance. Mild wall thickening at the rectum could reflect proctitis. Would correlate with the patient's symptoms. Lymphatic: No retroperitoneal or pelvic sidewall lymphadenopathy is seen. Reproductive: The bladder is mildly distended and grossly unremarkable. The prostate is normal in size, with diffuse calcification. Other: No additional soft tissue abnormalities are seen. Musculoskeletal: No acute osseous abnormalities are identified. The visualized musculature is unremarkable in appearance. Review of the MIP images confirms the above findings. IMPRESSION: 1. Right-sided pneumonia noted. 2. Mild wall thickening at the rectum could reflect proctitis. Would correlate with  the patient's symptoms. 3. No evidence of aortic dissection. No evidence of significant pulmonary embolus. 4. Aneurysmal dilatation of the aortic root to 5.1 cm in AP dimension. Aneurysmal dilatation persists to the level of the aortic arch, which measures up to 4.1 cm in diameter. Recommend semi-annual imaging followup by CTA or MRA and referral to cardiothoracic surgery if not already obtained. This recommendation follows 2010 ACCF/AHA/AATS/ACR/ASA/SCA/SCAI/SIR/STS/SVM Guidelines for the Diagnosis and Management of Patients With Thoracic Aortic Disease. Circulation. 2010; 121: e266-e36 5. Apparent penetrating aortic ulcerations noted along the common iliac  arteries bilaterally, measuring 0.8 x 0.3 cm on the right and 1.8 x 0.6 cm on the left. 6. Mild aneurysmal dilatation of the infrarenal abdominal aorta to 3.1 cm in transverse dimension. Recommend followup by [place] in 3 years. This recommendation follows ACR consensus guidelines: White Paper of the ACR Incidental Findings Committee II on Vascular Findings. J Am Coll Radiol 2013; 65:465-035 7. 2.5 cm anterior mediastinal soft tissue density mass, relatively well-circumscribed in appearance. Given its appearance, this could reflect a thymoma. MRI of the chest could be considered for further evaluation, as deemed clinically appropriate. 8. Scattered coronary artery calcifications. Electronically Signed   By: Garald Balding [name]   On: 08/25/2017 02:44        Scheduled Meds: . amoxicillin-clavulanate  1 tablet Oral Q12H  . enoxaparin (LOVENOX) injection  40 mg Subcutaneous Q24H  . hydrocerin   Topical BID  . sodium chloride flush  3 mL Intravenous Q12H   Continuous Infusions: . levETIRAcetam Stopped (08/26/17 0918)     LOS: 1 day    Time spent: 25 min    Geradine Girt, DO [facility] Pager [phone]  If 7PM-7AM, please contact night-coverage [email or web address] Password [email or web address] 08/26/2017, 2:00 PM

## 2017-08-26 NOTE — NC FL2 (Signed)
[place] LEVEL OF CARE SCREENING TOOL     IDENTIFICATION  Patient Name: Mike Williams Birthdate: Apr 02, 1933 Sex: male Admission Date (Current Location): 08/24/2017  [facility] and [place] Number:  Herbalist and Address:  [facility], [place] [address] [place], [place] [address]      Provider Number: [id number]  Attending Physician Name and Address:  Geradine Girt, DO  Relative Name and Phone Number:  [name], spouse, [phone]    Current Level of Care: Hospital Recommended Level of Care: [place] Prior Approval Number:    Date Approved/Denied:   PASRR Number:    Discharge Plan: SNF    Current Diagnoses: Patient Active Problem List   Diagnosis Date Noted  . Hypertensive urgency 08/25/2017  . Aspiration into airway 08/25/2017  . Mediastinal widening 08/25/2017  . Acute pulmonary edema ([place]) 08/25/2017  . Aspiration into respiratory tract 08/24/2017  . Vascular dementia without behavioral disturbance   . Palliative care encounter   . Acute lower UTI 06/03/2017  . Acute respiratory failure with hypoxia ([place]) 06/03/2017  . Toxic encephalopathy 06/03/2017  . Normocytic anemia 06/03/2017  . Severe protein-calorie malnutrition ([place]) 06/03/2017  . Influenza 06/02/2017  . CVA (cerebral vascular accident) ([place]) 11/28/2016  . Depression 12/01/2015  . PBA (pseudobulbar affect) 08/08/2015  . Overactive bladder 06/29/2014  . Seizures ([place]) 02/01/2013  . Aortic insufficiency 07/04/2011  . Alzheimer's dementia 05/12/2007  . Obstructive sleep apnea 12/29/2006  . Essential hypertension 12/29/2006  . PROSTATE CANCER, HX OF 12/29/2006    Orientation RESPIRATION BLADDER Height & Weight     Self  O2(Nasal cannula 3L) Incontinent, External catheter Weight: 64.9 kg (143 lb 1.3 oz) Height:  5\' 7"  (170.2 cm)  BEHAVIORAL SYMPTOMS/MOOD NEUROLOGICAL BOWEL NUTRITION STATUS      Continent Diet(Please see DC Summary)   AMBULATORY STATUS COMMUNICATION OF NEEDS Skin   Extensive Assist Verbally Normal                       Personal Care Assistance Level of Assistance  Bathing, Feeding, Dressing Bathing Assistance: Maximum assistance Feeding assistance: Maximum assistance Dressing Assistance: Maximum assistance     Functional Limitations Info             SPECIAL CARE FACTORS FREQUENCY                       Contractures      Additional Factors Info  Code Status, Allergies Code Status Info: DNR Allergies Info: NKA           Current Medications (08/26/2017):  This is the current hospital active medication list Current Facility-Administered Medications  Medication Dose Route Frequency Provider Last Rate Last Dose  . acetaminophen (TYLENOL) tablet 650 mg  650 mg Oral Q6H PRN Opyd, Ilene Qua, MD       Or  . acetaminophen (TYLENOL) suppository 650 mg  650 mg Rectal Q6H PRN Opyd, Ilene Qua, MD      . amoxicillin-clavulanate (AUGMENTIN) 875-125 MG per tablet 1 tablet  1 tablet Oral Q12H Vann, Jessica U, DO   1 tablet at 08/26/17 1350  . aspirin chewable tablet 81 mg  81 mg Oral Daily Vann, Jessica U, DO      . enoxaparin (LOVENOX) injection 40 mg  40 mg Subcutaneous Q24H Opyd, Ilene Qua, MD   40 mg at 08/26/17 1350  . escitalopram (LEXAPRO) tablet 5 mg  5 mg Oral  Daily Geradine Girt, DO      . felodipine (PLENDIL) 24 hr tablet 10 mg  10 mg Oral Daily Vann, Jessica U, DO      . hydrALAZINE (APRESOLINE) tablet 100 mg  100 mg Oral TID Vann, Jessica U, DO      . hydrocerin (EUCERIN) cream   Topical BID Opyd, Ilene Qua, MD      . irbesartan (AVAPRO) tablet 300 mg  300 mg Oral Daily Vann, Jessica U, DO      . labetalol (NORMODYNE,TRANDATE) injection 10 mg  10 mg Intravenous Q2H PRN Opyd, Ilene Qua, MD   10 mg at 08/25/17 0552  . levETIRAcetam (KEPPRA) 750 mg in sodium chloride 0.9 % 100 mL IVPB  750 mg Intravenous Q12H Opyd, Ilene Qua, MD   Stopped at 08/26/17 [phone]  . ondansetron  (ZOFRAN) tablet 4 mg  4 mg Oral Q6H PRN Opyd, Ilene Qua, MD       Or  . ondansetron (ZOFRAN) injection 4 mg  4 mg Intravenous Q6H PRN Opyd, Ilene Qua, MD      . sodium chloride flush (NS) 0.9 % injection 3 mL  3 mL Intravenous Q12H Opyd, Ilene Qua, MD   3 mL at 08/26/17 0909     Discharge Medications: Please see discharge summary for a list of discharge medications.  Relevant Imaging Results:  Relevant Lab Results:   Additional Information SS#: [id number]  Followed by [facility], [place]

## 2017-08-27 DIAGNOSIS — T17908A Unspecified foreign body in respiratory tract, part unspecified causing other injury, initial encounter: Secondary | ICD-10-CM

## 2017-08-27 LAB — URINE CULTURE: Culture: 40000 — AB

## 2017-08-27 LAB — CULTURE, BLOOD (ROUTINE X 2)

## 2017-08-27 LAB — GLUCOSE, CAPILLARY: GLUCOSE-CAPILLARY: 87 mg/dL (ref 65–99)

## 2017-08-27 MED ORDER — AMOXICILLIN-POT CLAVULANATE 875-125 MG PO TABS: 1.0000 | ORAL_TABLET | Freq: Two times a day (BID) | ORAL | 0 refills | Status: DC

## 2017-08-27 NOTE — Care Management Note (Signed)
Case Management Note  Patient Details  Name: Mike Williams MRN: [id number] Date of Birth: 12/17/1932  Subjective/Objective:          Pt admitted with aspiration. He is from [place] with [facility] following.          Action/Plan: Pt is discharging back to [place] today with [facility] following. No further needs per CM.   Expected Discharge Date:  08/27/17               Expected Discharge Plan:  Skilled Nursing Facility  In-House Referral:  Clinical Social Work  Discharge planning Services     Post Acute Care Choice:    Choice offered to:     DME Arranged:    DME Agency:     HH Arranged:    [place] Agency:     Status of Service:  Completed, signed off  If discussed at [employer], dates discussed:    Additional Comments:  Pollie Friar, RN 08/27/2017, 11:37 AM

## 2017-08-27 NOTE — Clinical Social Work Note (Signed)
Clinical Social Work Assessment  Patient Details  Name: Mike Williams MRN: [id number] Date of Birth: 12-27-1932  Date of referral:  08/27/17               Reason for consult:  Facility Placement                Permission sought to share information with:  Facility Sport and exercise psychologist, Family Supports Permission granted to share information::  No  Name::     Physicist, medical::  [employer]  Relationship::  Spouse  Contact Information:  [phone]  Housing/Transportation Living arrangements for the past 2 months:  [place] of Information:  Spouse Patient Interpreter Needed:  None Criminal Activity/Legal Involvement Pertinent to Current Situation/Hospitalization:  No - Comment as needed Significant Relationships:  Spouse Lives with:  Facility Resident Do you feel safe going back to the place where you live?  Yes Need for family participation in patient care:  Yes (Comment)  Care giving concerns:  CSW received consult regarding discharge planning. CSW spoke with patient's wife. She reported that patient is a long term care patient at [facility] and has [facility] following him. He will return at discharge. CSW to continue to follow and assist with discharge planning needs.   Social Worker assessment / plan:  CSW spoke with patient's spouse concerning return to SNF.  Employment status:  Retired Forensic scientist:  Other (Comment Required)(Hospice) PT Recommendations:  Not assessed at this time Information / Referral to community resources:  ([place])  Patient/Family's Response to care:  Patient's spouse reported understanding of discharge plan. She expresses gratitude to hospital staff for "saving" patient.   Patient/Family's Understanding of and Emotional Response to Diagnosis, Current Treatment, and Prognosis:  Patient/family is realistic regarding therapy needs and expressed being hopeful for return to SNF placement.  Patient's spouse expressed understanding of CSW role and discharge process as well as medical condition. No questions/concerns about plan or treatment.    Emotional Assessment Appearance:  Appears stated age Attitude/Demeanor/Rapport:  Unable to Assess Affect (typically observed):  Unable to Assess Orientation:  (Responds to voice) Alcohol / Substance use:  Not Applicable Psych involvement (Current and /or in the community):  No (Comment)  Discharge Needs  Concerns to be addressed:  Care Coordination Readmission within the last 30 days:  No Current discharge risk:  None Barriers to Discharge:  No Barriers Identified   Benard Halsted, [place] 08/27/2017, 8:59 AM

## 2017-08-27 NOTE — Progress Notes (Addendum)
[facility] G[facility] RN visit at 0915am.  This is a related and covered GIP admission of 08/25/17 with [facility] diagnosis of Alzheimer's dementia, per Dr. Earlie Counts. Patient has an [place] DNR. [employer] ([place]) activated EMS after [employer] RN went to facility to assess patient. Patient was having difficulty swallowing medicines/food. Was pocketing food starting 08/24/17. Patient with increased coughing and concern for aspiration.  Patient admitted for sepsis and aspiration pneumonia.   Day 3 of GIP.   Met with patient at bedside this morning.  Patient was resting well with good rise and fall of chest.  Patient did not wake to voice this morning.  Patient in NAD.  I spoke with Nira Conn, bedside RN, who advised no changes overnight that she is aware of at this time and will call me back after meeting.    Patient being treated with: amoxicillin-clavulanate (AUGMENTIN) 875-125 MG per tablet 1 tablet, Dose 1 tablet, Q12H via PO; enoxaparin (LOVENOX) injection 40 mg, Dose 40 mg, Q24H via [place]; RESOURCE THICKENUP CLEAR; levETIRAcetam (KEPPRA) 750 mg in sodium chloride 0.9% 100 mL IVPB, Dose 750 mg, Q12H, via IV.  No PRN meds given today.    O2 therapy at 3L O2 [place]. Patient receiving antibiotic therapy (see above).   DME to be set up for patient by the facility:  O2 concentrator and suction.  Do not need to order any additional equipment through [facility]    Goals of care: Patient is DNR.  I spoke with [name], wife, 08/25/17 regarding code status and her plans for patient care.  We went through scenarios of patient's declining health and she was at peace with leaving the patient DNR/DNI.   She advised that she does not want patient to suffer and would prefer patient to be comfortable.    Discharge planning: Once patient is making clinical progress towards resolving the aspiration pneumonia, patient will be discharged back to [facility]). Per  Percell Locus, [place], patient to be discharged back to [place] on 08/27/17.  As patient is a current [facility] patient, please use [facility] for transport back to facility.   If you have any questions, please feel free to contact [facility].   Thank you,   Edyth Gunnels, RN, [facility] Liaison [phone]

## 2017-08-27 NOTE — Progress Notes (Signed)
Patient discharged to  [facility]. IV and telemetry discontinued. Discharge information discussed with caregiver at SNF. [facility] and EMS available for transportation. Report called to facility. Wendee Copp

## 2017-08-27 NOTE — Discharge Summary (Signed)
Physician Discharge Summary  Song Garris OHY:[id number] DOB: February 15, 1933 DOA: 08/24/2017  PCP: Mike Cranker, DO  Admit date: 08/24/2017 Discharge date: 08/27/2017  Admitted From: SNF Disposition:  SNF  Discharge Condition:Stable CODE STATUS:DNR Diet recommendation: Dysphagia 1,nectar thick liquid  Brief/Interim Summary:  Mike Priceis a 84 y.o.malewith medical history significant foradvanced dementia, history of CVA, seizure disorder, hypertension, and dysphasia, now presenting from his nursing facility for evaluation of coughing. Patient has dysphasia and is given a dysphasia diet at the nursing facility, but was noted to develop a coughing fit while eating a couple days ago. He had a mild cough 2 days ago but has been coughing constantly today. Patient is unable to report any complaints due to his advanced dementia.  Found on imaging to have an aspiration pneumonia. Patient is on hospice at [facility]. Patient's respiratory status gradually improved.  Currently he is afebrile.  Respiratory status is stable.  Cultures negative so far. Antibiotics changed to oral.  He is stable to be discharged to skilled nursing facility.  Following problems were addressed during his hospitalization:    Aspiration PNA causing acute hypoxic respiratory failure from dysphagia -seen by SLP and recommendations is  DYS 1, nectar thick-- meds crushed in puree -known aspiration risk -on hospice -no further fever -de-escalated abx to augmentin  1/2 gram + cocci in blood cultures -discussed with pharmacy, most likely is a contaminant  Dementia -Advanced -Continue supportive care -is on hospice  Hypertension  -resume home meds and monitor  Seizure disorder -No seizure-like activity in ED -Continue Keppra   Discharge Diagnoses:  Principal Problem:   Aspiration into respiratory tract Active Problems:   Alzheimer's dementia   Essential hypertension   Seizures ([place])   Acute  respiratory failure with hypoxia ([place])   Hypertensive urgency   Aspiration into airway   Mediastinal widening   Acute pulmonary edema [facility])    Discharge Instructions  Discharge Instructions    Diet - low sodium heart healthy   Complete by:  As directed    Discharge instructions   Complete by:  As directed    1) Take prescribed medications as instructed.   Increase activity slowly   Complete by:  As directed      Allergies as of 08/27/2017   No Known Allergies     Medication List    TAKE these medications   acetaminophen 500 MG tablet Commonly known as:  TYLENOL Take 1,000 mg by mouth every 6 (six) hours as needed (pain).   amoxicillin-clavulanate 875-125 MG tablet Commonly known as:  AUGMENTIN Take 1 tablet by mouth every 12 (twelve) hours for 4 days.   aspirin 81 MG chewable tablet Chew 81 mg by mouth daily.   betamethasone dipropionate 0.05 % cream Commonly known as:  DIPROLENE Apply 1 application topically daily. Apply to Groin area   escitalopram 5 MG tablet Commonly known as:  LEXAPRO Take 5 mg by mouth daily. For depression   eucerin lotion Apply topically 2 (two) times daily. For dry skin   felodipine 10 MG 24 hr tablet Commonly known as:  PLENDIL Take 10 mg by mouth daily.   hydrALAZINE 100 MG tablet Commonly known as:  APRESOLINE Take 100 mg by mouth 3 (three) times daily.   levETIRAcetam 750 MG tablet Commonly known as:  KEPPRA Take 750 mg by mouth 2 (two) times daily.   olmesartan 40 MG tablet Commonly known as:  BENICAR Take 40 mg by mouth daily.      Follow-up Information  Mike Cranker, DO. Schedule an appointment as soon as possible for a visit in 1 week(s).   Specialty:  Internal Medicine Contact information: [place] [id number] [phone]          No Known Allergies  Consultations:  None   Procedures/Studies: Dg Chest Portable 1 View  Result Date: 08/24/2017 CLINICAL DATA:  Cough.   Dysphagia. EXAM: PORTABLE CHEST 1 VIEW COMPARISON:  06/02/2017 FINDINGS: Mild enlargement of the cardiac silhouette. Apparent widening of the mediastinum. Right paratracheal thickening. Tortuosity and calcific atherosclerotic disease of the aorta. Elevation of the left hemidiaphragm. There is no evidence of focal airspace consolidation, pleural effusion or pneumothorax. Osseous structures are without acute abnormality. Soft tissues are grossly normal. IMPRESSION: Apparent widening of the mediastinum. This may be due to ectatic ascending and descending thoracic aorta, however aneurysmal dilation of the aorta cannot be excluded. If found clinically appropriate, CT angiogram of the chest may be used for further evaluation. No evidence of lobar consolidation. Electronically Signed   By: Fidela Salisbury [name]   On: 08/24/2017 22:14   Ct Angio Chest/abd/pel For Dissection W And/or W/wo  Result Date: 08/25/2017 CLINICAL DATA:  Acute onset of cough.  Mediastinal widening. EXAM: CT ANGIOGRAPHY CHEST, ABDOMEN AND PELVIS TECHNIQUE: Multidetector CT imaging through the chest, abdomen and pelvis was performed using the standard protocol during bolus administration of intravenous contrast. Multiplanar reconstructed images and MIPs were obtained and reviewed to evaluate the vascular anatomy. CONTRAST:  168mL ISOVUE-370 IOPAMIDOL (ISOVUE-370) INJECTION 76% COMPARISON:  None. FINDINGS: CTA CHEST FINDINGS Cardiovascular: There is aneurysmal dilatation of the aortic root to 5.1 cm in AP dimension. Aneurysmal dilatation persists at the aortic arch, measuring up to 4.1 cm in diameter. The great vessels are grossly unremarkable in appearance. There is no evidence of aortic dissection. There is no evidence of significant pulmonary embolus. The heart is normal in size. Mild calcification is noted at the aortic arch. Mediastinum/Nodes: A 2.5 cm anterior mediastinal mass is noted, of soft tissue density and relatively  well-circumscribed in appearance. Given its appearance, this could reflect a thymoma. Scattered coronary artery calcifications are seen. No mediastinal lymphadenopathy is seen. No pericardial effusion is identified. The visualized portions of the thyroid gland are unremarkable. No axillary lymphadenopathy is seen. Lungs/Pleura: Patchy right-sided airspace opacification raises concern for pneumonia. Mild bibasilar atelectasis is noted. No pleural effusion or pneumothorax is seen. No masses are identified. Musculoskeletal: No acute osseous abnormalities are identified. The visualized musculature is unremarkable in appearance. Review of the MIP images confirms the above findings. CTA ABDOMEN AND PELVIS FINDINGS VASCULAR Aorta: There is mild aneurysmal dilatation of the infrarenal abdominal aorta to 3.1 cm in maximal transverse dimension. There is no evidence of aortic dissection. Scattered calcification is seen along the distal abdominal aorta. Celiac: The celiac trunk is unremarkable in appearance. SMA: The superior mesenteric artery remains patent. Renals: The renal arteries appear patent bilaterally. IMA: The inferior mesenteric artery is unremarkable in appearance. Inflow: There appear to be penetrating aortic ulcerations along the common iliac arteries bilaterally, measuring 0.8 x 0.3 cm on the right and 1.8 x 0.6 cm on the left. The common iliac arteries and their branches are somewhat ectatic. Veins: Visualized venous structures are grossly unremarkable in appearance. Review of the MIP images confirms the above findings. NON-VASCULAR Hepatobiliary: The liver is unremarkable in appearance. The gallbladder is unremarkable in appearance. The common bile duct remains normal in caliber. Pancreas: The pancreas is within normal limits. Spleen: The spleen  is unremarkable in appearance. Adrenals/Urinary Tract: The adrenal glands are unremarkable in appearance. A left renal cyst is noted. Mild left-sided perinephric  stranding is noted. There is no evidence of hydronephrosis. No renal or ureteral stones are identified. Stomach/Bowel: The stomach is unremarkable in appearance. The small bowel is within normal limits. The appendix is not visualized; there is no evidence for appendicitis. The colon is unremarkable in appearance. Mild wall thickening at the rectum could reflect proctitis. Would correlate with the patient's symptoms. Lymphatic: No retroperitoneal or pelvic sidewall lymphadenopathy is seen. Reproductive: The bladder is mildly distended and grossly unremarkable. The prostate is normal in size, with diffuse calcification. Other: No additional soft tissue abnormalities are seen. Musculoskeletal: No acute osseous abnormalities are identified. The visualized musculature is unremarkable in appearance. Review of the MIP images confirms the above findings. IMPRESSION: 1. Right-sided pneumonia noted. 2. Mild wall thickening at the rectum could reflect proctitis. Would correlate with the patient's symptoms. 3. No evidence of aortic dissection. No evidence of significant pulmonary embolus. 4. Aneurysmal dilatation of the aortic root to 5.1 cm in AP dimension. Aneurysmal dilatation persists to the level of the aortic arch, which measures up to 4.1 cm in diameter. Recommend semi-annual imaging followup by CTA or MRA and referral to cardiothoracic surgery if not already obtained. This recommendation follows 2010 ACCF/AHA/AATS/ACR/ASA/SCA/SCAI/SIR/STS/SVM Guidelines for the Diagnosis and Management of Patients With Thoracic Aortic Disease. Circulation. 2010; 121: e266-e36 5. 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 6. Mild aneurysmal dilatation of the infrarenal abdominal aorta to 3.1 cm in transverse dimension. Recommend followup by [place] in 3 years. This recommendation follows ACR consensus guidelines: White Paper of  the ACR Incidental Findings Committee II on Vascular Findings. J Am Coll Radiol 2013; 24:097-353 7. 2.5 cm anterior mediastinal soft tissue density mass, relatively well-circumscribed in appearance. Given its appearance, this could reflect a thymoma. MRI of the chest could be considered for further evaluation, as deemed clinically appropriate. 8. Scattered coronary artery calcifications. Electronically Signed   By: Garald Balding [name]   On: 08/25/2017 02:44       Subjective:   Discharge Exam: Vitals:   08/27/17 0308 08/27/17 0738  BP: 109/66 133/71  Pulse: 72 61  Resp: 18 20  Temp: 99.1 F (37.3 C) 98.1 F (36.7 C)  SpO2: 96% 100%   Vitals:   08/26/17 1924 08/26/17 2352 08/27/17 0308 08/27/17 0738  BP: (!) 145/77 119/68 109/66 133/71  Pulse: 98 88 72 61  Resp: 20 18 18 20   Temp: (!) 100.5 F (38.1 C) (!) 101 F (38.3 C) 99.1 F (37.3 C) 98.1 F (36.7 C)  TempSrc: Oral Oral Oral Oral  SpO2: 100% 99% 96% 100%  Weight:      Height:        General: Pt is alert, awake, not in acute distress, chronically ill looking Cardiovascular: RRR, S1/S2 +, no rubs, no gallops Respiratory: CTA bilaterally, no wheezing, no rhonchi Abdominal: Soft, NT, ND, bowel sounds + Extremities: no edema, no cyanosis    The results of significant diagnostics from this hospitalization (including imaging, microbiology, ancillary and laboratory) are listed below for reference.     Microbiology: Recent Results (from the past 240 hour(s))  Culture, blood (Routine x 2)     Status: None (Preliminary result)   Collection Time: 08/24/17  9:55 PM  Result Value Ref Range Status  Specimen Description BLOOD LEFT HAND  Final   Special Requests   Final    BOTTLES DRAWN AEROBIC AND ANAEROBIC Blood Culture results may not be optimal due to an inadequate volume of blood received in culture bottles   Culture  Setup Time   Final    GRAM POSITIVE COCCI IN CLUSTERS IN BOTH AEROBIC AND ANAEROBIC BOTTLES CRITICAL  RESULT CALLED TO, READ BACK BY AND VERIFIED WITH: J [facility] 08/25/17 2345 JDW    Culture   Final    GRAM POSITIVE COCCI CULTURE REINCUBATED FOR BETTER GROWTH Performed at [facility], [place] [address], [place], [place] [address]    Report Status PENDING  Incomplete  Blood Culture ID Panel (Reflexed)     Status: None   Collection Time: 08/24/17  9:55 PM  Result Value Ref Range Status   Enterococcus species NOT DETECTED NOT DETECTED Final   Listeria monocytogenes NOT DETECTED NOT DETECTED Final   Staphylococcus species NOT DETECTED NOT DETECTED Final   Staphylococcus aureus NOT DETECTED NOT DETECTED Final    Comment: CRITICAL RESULT CALLED TO, READ BACK BY AND VERIFIED WITH: J [facility] 08/25/17 2345 JDW    Streptococcus species NOT DETECTED NOT DETECTED Final   Streptococcus agalactiae NOT DETECTED NOT DETECTED Final   Streptococcus pneumoniae NOT DETECTED NOT DETECTED Final   Streptococcus pyogenes NOT DETECTED NOT DETECTED Final   Acinetobacter baumannii NOT DETECTED NOT DETECTED Final   Enterobacteriaceae species NOT DETECTED NOT DETECTED Final   Enterobacter cloacae complex NOT DETECTED NOT DETECTED Final   Escherichia coli NOT DETECTED NOT DETECTED Final   Klebsiella oxytoca NOT DETECTED NOT DETECTED Final   Klebsiella pneumoniae NOT DETECTED NOT DETECTED Final   Proteus species NOT DETECTED NOT DETECTED Final   Serratia marcescens NOT DETECTED NOT DETECTED Final   Haemophilus influenzae NOT DETECTED NOT DETECTED Final   Neisseria meningitidis NOT DETECTED NOT DETECTED Final   Pseudomonas aeruginosa NOT DETECTED NOT DETECTED Final   Candida albicans NOT DETECTED NOT DETECTED Final   Candida glabrata NOT DETECTED NOT DETECTED Final   Candida krusei NOT DETECTED NOT DETECTED Final   Candida parapsilosis NOT DETECTED NOT DETECTED Final   Candida tropicalis NOT DETECTED NOT DETECTED Final    Comment: Performed at [facility], [place] [address],  [place], [place] [address]  Urine culture     Status: Abnormal   Collection Time: 08/24/17 10:00 PM  Result Value Ref Range Status   Specimen Description URINE, CATHETERIZED  Final   Special Requests   Final    NONE Performed at [facility], [place] [address] [place], [place] [address]    Culture 40,000 COLONIES/mL KLEBSIELLA PNEUMONIAE (A)  Final   Report Status 08/27/2017 FINAL  Final   Organism ID, Bacteria KLEBSIELLA PNEUMONIAE (A)  Final      Susceptibility   Klebsiella pneumoniae - MIC*    AMPICILLIN >=32 RESISTANT Resistant     CEFAZOLIN <=4 SENSITIVE Sensitive     CEFTRIAXONE <=1 SENSITIVE Sensitive     CIPROFLOXACIN <=0.25 SENSITIVE Sensitive     GENTAMICIN <=1 SENSITIVE Sensitive     IMIPENEM <=0.25 SENSITIVE Sensitive     NITROFURANTOIN 64 INTERMEDIATE Intermediate     TRIMETH/SULFA <=20 SENSITIVE Sensitive     AMPICILLIN/SULBACTAM 4 SENSITIVE Sensitive     PIP/TAZO <=4 SENSITIVE Sensitive     Extended ESBL NEGATIVE Sensitive     * 40,000 COLONIES/mL KLEBSIELLA PNEUMONIAE  Culture, blood (Routine x 2)  Status: None (Preliminary result)   Collection Time: 08/25/17  1:00 AM  Result Value Ref Range Status   Specimen Description BLOOD LEFT ANTECUBITAL  Final   Special Requests AEROBIC BOTTLE ONLY Blood Culture adequate volume  Final   Culture   Final    NO GROWTH 1 DAY Performed at [facility], [place] [address], [place], [place] [address]    Report Status PENDING  Incomplete     Labs: BNP (last 3 results) Recent Labs    06/02/17 1722  BNP 39.7   Basic Metabolic Panel: Recent Labs  Lab 08/24/17 2205 08/25/17 0223  NA 138 136  K 4.4 3.9  CL 102 103  CO2 25 23  GLUCOSE 125* 111*  BUN 11 10  CREATININE 0.96 0.91  CALCIUM 9.4 8.3*   Liver Function Tests: Recent Labs  Lab 08/24/17 2205  AST 22  ALT 15*  ALKPHOS 104  BILITOT 0.8  PROT 7.8  ALBUMIN 3.8   No results for input(s): LIPASE, AMYLASE in the last 168 hours. No results for  input(s): AMMONIA in the last 168 hours. CBC: Recent Labs  Lab 08/24/17 2205 08/25/17 0223  WBC 9.0 9.0  NEUTROABS 6.8 7.1  HGB 14.3 12.0*  HCT 42.5 36.6*  MCV 86.6 86.5  PLT 191 159   Cardiac Enzymes: No results for input(s): CKTOTAL, CKMB, CKMBINDEX, TROPONINI in the last 168 hours. BNP: Invalid input(s): POCBNP CBG: Recent Labs  Lab 08/25/17 1749 08/26/17 0616 08/27/17 0628  GLUCAP 103* 74 87   D-Dimer No results for input(s): DDIMER in the last 72 hours. Hgb A1c No results for input(s): HGBA1C in the last 72 hours. Lipid Profile No results for input(s): CHOL, HDL, LDLCALC, TRIG, CHOLHDL, LDLDIRECT in the last 72 hours. Thyroid function studies No results for input(s): TSH, T4TOTAL, T3FREE, THYROIDAB in the last 72 hours.  Invalid input(s): FREET3 Anemia work up No results for input(s): VITAMINB12, FOLATE, FERRITIN, TIBC, IRON, RETICCTPCT in the last 72 hours. Urinalysis    Component Value Date/Time   COLORURINE YELLOW 08/24/2017 2211   APPEARANCEUR HAZY (A) 08/24/2017 2211   LABSPEC 1.024 08/24/2017 2211   PHURINE 5.0 08/24/2017 2211   GLUCOSEU NEGATIVE 08/24/2017 2211   HGBUR NEGATIVE 08/24/2017 2211   BILIRUBINUR NEGATIVE 08/24/2017 2211   BILIRUBINUR n 01/08/2012 1122   KETONESUR 5 (A) 08/24/2017 2211   PROTEINUR 100 (A) 08/24/2017 2211   UROBILINOGEN 1.0 09/23/2014 1245   NITRITE NEGATIVE 08/24/2017 2211   LEUKOCYTESUR LARGE (A) 08/24/2017 2211   Sepsis Labs Invalid input(s): PROCALCITONIN,  WBC,  LACTICIDVEN Microbiology Recent Results (from the past 240 hour(s))  Culture, blood (Routine x 2)     Status: None (Preliminary result)   Collection Time: 08/24/17  9:55 PM  Result Value Ref Range Status   Specimen Description BLOOD LEFT HAND  Final   Special Requests   Final    BOTTLES DRAWN AEROBIC AND ANAEROBIC Blood Culture results may not be optimal due to an inadequate volume of blood received in culture bottles   Culture  Setup Time   Final     GRAM POSITIVE COCCI IN CLUSTERS IN BOTH AEROBIC AND ANAEROBIC BOTTLES CRITICAL RESULT CALLED TO, READ BACK BY AND VERIFIED WITH: [facility] 08/25/17 2345 JDW    Culture   Final    GRAM POSITIVE COCCI CULTURE REINCUBATED FOR BETTER GROWTH Performed at [facility], [place] [address], [place], [place] [address]    Report Status PENDING  Incomplete  Blood  Culture ID Panel (Reflexed)     Status: None   Collection Time: 08/24/17  9:55 PM  Result Value Ref Range Status   Enterococcus species NOT DETECTED NOT DETECTED Final   Listeria monocytogenes NOT DETECTED NOT DETECTED Final   Staphylococcus species NOT DETECTED NOT DETECTED Final   Staphylococcus aureus NOT DETECTED NOT DETECTED Final    Comment: CRITICAL RESULT CALLED TO, READ BACK BY AND VERIFIED WITH: J [facility] PHARMD 08/25/17 2345 JDW    Streptococcus species NOT DETECTED NOT DETECTED Final   Streptococcus agalactiae NOT DETECTED NOT DETECTED Final   Streptococcus pneumoniae NOT DETECTED NOT DETECTED Final   Streptococcus pyogenes NOT DETECTED NOT DETECTED Final   Acinetobacter baumannii NOT DETECTED NOT DETECTED Final   Enterobacteriaceae species NOT DETECTED NOT DETECTED Final   Enterobacter cloacae complex NOT DETECTED NOT DETECTED Final   Escherichia coli NOT DETECTED NOT DETECTED Final   Klebsiella oxytoca NOT DETECTED NOT DETECTED Final   Klebsiella pneumoniae NOT DETECTED NOT DETECTED Final   Proteus species NOT DETECTED NOT DETECTED Final   Serratia marcescens NOT DETECTED NOT DETECTED Final   Haemophilus influenzae NOT DETECTED NOT DETECTED Final   Neisseria meningitidis NOT DETECTED NOT DETECTED Final   Pseudomonas aeruginosa NOT DETECTED NOT DETECTED Final   Candida albicans NOT DETECTED NOT DETECTED Final   Candida glabrata NOT DETECTED NOT DETECTED Final   Candida krusei NOT DETECTED NOT DETECTED Final   Candida parapsilosis NOT DETECTED NOT DETECTED Final   Candida tropicalis NOT DETECTED NOT DETECTED  Final    Comment: Performed at [facility], [place]. [address], [place], [place] [address]  Urine culture     Status: Abnormal   Collection Time: 08/24/17 10:00 PM  Result Value Ref Range Status   Specimen Description URINE, CATHETERIZED  Final   Special Requests   Final    NONE Performed at [facility], [place] [address], [place], [place] [address]    Culture 40,000 COLONIES/mL KLEBSIELLA PNEUMONIAE (A)  Final   Report Status 08/27/2017 FINAL  Final   Organism ID, Bacteria KLEBSIELLA PNEUMONIAE (A)  Final      Susceptibility   Klebsiella pneumoniae - MIC*    AMPICILLIN >=32 RESISTANT Resistant     CEFAZOLIN <=4 SENSITIVE Sensitive     CEFTRIAXONE <=1 SENSITIVE Sensitive     CIPROFLOXACIN <=0.25 SENSITIVE Sensitive     GENTAMICIN <=1 SENSITIVE Sensitive     IMIPENEM <=0.25 SENSITIVE Sensitive     NITROFURANTOIN 64 INTERMEDIATE Intermediate     TRIMETH/SULFA <=20 SENSITIVE Sensitive     AMPICILLIN/SULBACTAM 4 SENSITIVE Sensitive     PIP/TAZO <=4 SENSITIVE Sensitive     Extended ESBL NEGATIVE Sensitive     * 40,000 COLONIES/mL KLEBSIELLA PNEUMONIAE  Culture, blood (Routine x 2)     Status: None (Preliminary result)   Collection Time: 08/25/17  1:00 AM  Result Value Ref Range Status   Specimen Description BLOOD LEFT ANTECUBITAL  Final   Special Requests AEROBIC BOTTLE ONLY Blood Culture adequate volume  Final   Culture   Final    NO GROWTH 1 DAY Performed at [facility], [place] [address] [place], [place] [address]    Report Status PENDING  Incomplete     Time coordinating discharge: 35 minutes  SIGNED:   Shelly Coss, MD  [facility] 08/27/2017, 11:34 AM Pager [phone]  If 7PM-7AM, please contact night-coverage [email or web address] Password [email or web address]

## 2017-08-27 NOTE — Progress Notes (Signed)
Patient will DC to: [place] Anticipated DC date: 08/27/17 Family notified: Spouse Transport by: [facility]   Per MD patient ready for DC to [facility]. RN, patient, patient's family, and facility notified of DC. Discharge Summary sent to facility. RN given number for report [phone] Room 104b). DC packet on chart. Ambulance transport requested for patient.   CSW signing off.  Cedric Fishman, LCSW Clinical Social Worker [phone]

## 2017-08-28 ENCOUNTER — Telehealth: Payer: Self-pay

## 2017-08-28 NOTE — Telephone Encounter (Signed)
Possible re-admission to facility. This is a patient you were seeing at [facility] . [facility] F/U is needed if patient was re-admitted to facility upon discharge. Hospital discharge from [facility] on 08/27/2017

## 2017-08-30 LAB — CULTURE, BLOOD (ROUTINE X 2)
Culture: NO GROWTH
SPECIAL REQUESTS: ADEQUATE

## 2017-08-31 ENCOUNTER — Non-Acute Institutional Stay (SKILLED_NURSING_FACILITY): Payer: Medicare Other | Admitting: Adult Health

## 2017-08-31 ENCOUNTER — Encounter: Payer: Self-pay | Admitting: Adult Health

## 2017-08-31 DIAGNOSIS — F0150 Vascular dementia without behavioral disturbance: Secondary | ICD-10-CM

## 2017-08-31 DIAGNOSIS — I639 Cerebral infarction, unspecified: Secondary | ICD-10-CM

## 2017-08-31 DIAGNOSIS — R569 Unspecified convulsions: Secondary | ICD-10-CM

## 2017-08-31 DIAGNOSIS — T17908D Unspecified foreign body in respiratory tract, part unspecified causing other injury, subsequent encounter: Secondary | ICD-10-CM | POA: Diagnosis not present

## 2017-08-31 DIAGNOSIS — E43 Unspecified severe protein-calorie malnutrition: Secondary | ICD-10-CM

## 2017-08-31 DIAGNOSIS — I10 Essential (primary) hypertension: Secondary | ICD-10-CM

## 2017-08-31 DIAGNOSIS — J9601 Acute respiratory failure with hypoxia: Secondary | ICD-10-CM | POA: Diagnosis not present

## 2017-08-31 DIAGNOSIS — R1312 Dysphagia, oropharyngeal phase: Secondary | ICD-10-CM

## 2017-08-31 DIAGNOSIS — D649 Anemia, unspecified: Secondary | ICD-10-CM

## 2017-08-31 NOTE — Progress Notes (Signed)
Location:   [facility] Room Number: 107 A Place of Service:  SNF (31)   CODE STATUS: DNR (Most form updated 07-08-17)  No Known Allergies  Chief Complaint  Patient presents with  . Hospitalization Follow-up    Hospital follow up    HPI:  He is a 84 year old long term resident of this facility who has been hospitalized from 08-24-17 through 08-27-17. He had been hospitalized for aspiration pneumonia.  He has completed his augmentin. He is to be on nectar thick liquids; he continue to be followed by hospice care. He is unable to participate in the hpi or ros. There are no reports of fever present; he is now 02 dependent; there are no reports of uncontrolled or further aspiration. There are no nursing concerns at this time.   Past Medical History:  Diagnosis Date  . Aortic insufficiency 07/04/2011   2/13 Study Conclusions  - Left ventricle: Systolic function was normal. The estimated ejection fraction was in the range of 55% to 60%. - Aortic valve: Moderate regurgitation. - Aorta: The aorta was moderately dilated. Transthoracic echocardiography. M-mode, complete 2D, spectral Doppler, and color Doppler. Height: Height: 170.2cm. Height: 67in. Weight: Weight: 84.1kg. Weight: 185lb. Body mass index: BMI: 29kg/m^2. Body surface area: BSA: 2.70m^2. Blood pressure: 175/86. Patient status: Inpatient. Location: Bedside.      . Aortic root dilatation (HCC)    Noted on 09/2010 echo, with mild-mod AI  . CEREBROVASCULAR ACCIDENT, HX OF 02/09/2007  . DEMENTIA 05/12/2007  . Hemorrhoids   . HYPERGLYCEMIA 09/21/2009  . HYPERTENSION 12/29/2006  . Lactose intolerance   . OBSTRUCTIVE SLEEP APNEA 12/29/2006  . PROSTATE CANCER, HX OF 12/29/2006  . Prostatitis   . Risk for falls   . Seizures ([place])     Past Surgical History:  Procedure Laterality Date  . CHOLECYSTECTOMY    . GANGLION CYST EXCISION    . PROSTATE SURGERY     cryotherapy    Social History   Socioeconomic History  . Marital  status: Married    Spouse name: Not on file  . Number of children: 2  . Years of education: master's  . Highest education level: Not on file  Occupational History  . Occupation: retired  Scientific laboratory technician  . Financial resource strain: Not on file  . Food insecurity:    Worry: Not on file    Inability: Not on file  . Transportation needs:    Medical: Not on file    Non-medical: Not on file  Tobacco Use  . Smoking status: Never Smoker  . Smokeless tobacco: Never Used  Substance and Sexual Activity  . Alcohol use: No  . Drug use: No  . Sexual activity: Not on file  Lifestyle  . Physical activity:    Days per week: Not on file    Minutes per session: Not on file  . Stress: Not on file  Relationships  . Social connections:    Talks on phone: Not on file    Gets together: Not on file    Attends religious service: Not on file    Active member of club or organization: Not on file    Attends meetings of clubs or organizations: Not on file    Relationship status: Not on file  . Intimate partner violence:    Fear of current or ex partner: Not on file    Emotionally abused: Not on file    Physically abused: Not on file    Forced  sexual activity: Not on file  Other Topics Concern  . 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       Patient does not drink caffeine.   Patient is right handed.   Family History  Problem Relation Age of Onset  . Heart attack Father   . Heart disease Father   . Cancer Sister       VITAL SIGNS BP 120/70   Pulse 69   Temp (!) 97.1 F (36.2 C)   Resp 18   Ht 5\' 7"  (1.702 m)   Wt 142 lb 14.4 oz (64.8 kg)   SpO2 96%   BMI 22.38 kg/m   Outpatient Encounter Medications as of 08/31/2017  Medication Sig  . acetaminophen (TYLENOL) 500 MG tablet Take 1,000 mg by mouth every 6 (six) hours as needed (pain).   Marland Kitchen aspirin 81 MG chewable tablet Chew 81 mg by mouth daily.  . betamethasone dipropionate (DIPROLENE)  0.05 % cream Apply 1 application topically daily. Apply to Groin area  . Emollient (EUCERIN) lotion Apply topically 2 (two) times daily. For dry skin  . escitalopram (LEXAPRO) 5 MG tablet Take 5 mg by mouth daily. For depression   . felodipine (PLENDIL) 10 MG 24 hr tablet Take 10 mg by mouth daily.  . hydrALAZINE (APRESOLINE) 100 MG tablet Take 100 mg by mouth 3 (three) times daily.  Marland Kitchen levETIRAcetam (KEPPRA) 750 MG tablet Take 750 mg by mouth 2 (two) times daily.  . Nutritional Supplements (NUTRITIONAL SUPPLEMENT PO) Regular Diet - Puree texture, Nectar thickened fluids consistency  . olmesartan (BENICAR) 40 MG tablet Take 40 mg by mouth daily.  . [DISCONTINUED] amoxicillin-clavulanate (AUGMENTIN) 875-125 MG tablet Take 1 tablet by mouth every 12 (twelve) hours for 4 days. (Patient not taking: Reported on 08/31/2017)   No facility-administered encounter medications on file as of 08/31/2017.      SIGNIFICANT DIAGNOSTIC EXAMS  DIAGNOSTIC EXAMS  PREVIOUS:    05-31-17: chest x-ray: No acute cardiopulmonary process.   05-31-17: ct of head and spine: No acute intracranial abnormality. Stable cerebral atrophy, chronic small vessel disease, and old lacunar infarcts. Mild right frontal scalp hematoma.  No evidence of skull fracture. No evidence of acute cervical spine fracture or subluxation. Degenerative spondylosis, as described above.  TODAY:   08-24-17: chest x-ray: Apparent widening of the mediastinum. This may be due to ectatic ascending and descending thoracic aorta, however aneurysmal dilation of the aorta cannot be excluded. If found clinically appropriate, CT angiogram of the chest may be used for further evaluation. No evidence of lobar consolidation.  08-25-17: ct angio of chest abdomen and pelvis for dissection: 1. Right-sided pneumonia noted. 2. Mild wall thickening at the rectum could reflect proctitis. Would correlate with the patient's symptoms. 3. No evidence of aortic  dissection. No evidence of significant pulmonary embolus. 4. Aneurysmal dilatation of the aortic root to 5.1 cm in AP dimension. Aneurysmal dilatation persists to the level of the aortic arch, which measures up to 4.1 cm in diameter. Recommend semi-annual imaging followup by CTA or MRA and referral to cardiothoracic surgery if not already obtained.  5. 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 6. Mild aneurysmal dilatation of the infrarenal abdominal aorta to 3.1 cm in transverse dimension. Recommend followup by [place] in 3 years. 7. 2.5 cm anterior mediastinal soft tissue density mass,  relatively well-circumscribed in appearance. Given its appearance, this could reflect a thymoma. MRI of the chest could be considered for further evaluation, as deemed clinically appropriate. 8. Scattered coronary artery calcifications.       LABS REVIEWED:PREVIOUS   10-23-16: wbc 9.8; hgb 12.8; hct 37.1; mcv 87.6; plt 125; glucose 131; bun 19.5; creat 0.93; k+  3.6; na++ 150; ca 8.5  liver normal albumin 3.3 urine culture proteus mirabilis: rocephin  05-31-17: wbc 9.0; hgb 14.3; hct 43.2; mcv 87.1; plt 124; glucose 177; bun 12; creat 1.20; k+ 4.0; na+ 136; ca 8.9; liver normal albumin 3.4; blood culture: no growth; rapid flu: + A; urine culture: e-coli   06-03-17: wbc 5.6; hgb 11.8 hct 36.0; mv 85.9; plt 163; glucose 95; bun 20; creat 0.83; k+ 3.5; na++ 141; ca 8.5 06-06-17: wbc 7.4; hgb 11.4; hct 33.2; mcv 84.7; plt 170; glucose 94; bun 16; creat 0.85; k+ 3.4; na++ 139; ca 8.6   TODAY:   08-24-17: wbc 9.0; hgb 14.3; hct 42.5; mcv 86.6; plt 191; glucose 125 bun 11; creat 0.96; k+ 4.4; na++ 138; ca 9.4; liver normal albumin 3.8; urine culture: 40,000 colonies 08-25-17: wbc 9.0; hgb 12.0; hct 36.6; mcv 86.5; plt 159; glucose 111; bun 10; creat 0.91; k+ 3.9; na++ 136 ca 8.3    Review of Systems  Unable to  perform ROS: Dementia (nonverbal )     Physical Exam  Constitutional: No distress.  Frail   Neck: No thyromegaly present.  Cardiovascular: Normal rate, regular rhythm and intact distal pulses.  Murmur heard. 1/6  Pulmonary/Chest: Effort normal. No respiratory distress.  02 dependent Breath sounds diminished   Abdominal: Soft. Bowel sounds are normal. He exhibits no distension.  Musculoskeletal: He exhibits no edema.  Is able to move extremities   Lymphadenopathy:    He has no cervical adenopathy.  Neurological:  Is aware  Skin: Skin is warm and dry. He is not diaphoretic.    ASSESSMENT/PLAN  TODAY:   1. CVA: is neurologically stable; will continue asa 81 mg daily   2. Essential hypertension: stable b/p 120/70: will continue benicar 40 mg daily felodinpine ER 10 mg daily hydralazine 100 mg three times daily will monitor   3. Vascular dementia without behavioral disturbance/Alzheimer's dementia; weight is 142 pounds; without change in status; is followed by hospice care; will monitor his status.   4. Depression no change in status; will continue lexapro 5 mg daily   5. Normocytic anemia: stable hgb 12.0 will monitor   6. Seizures: no reports of seizure activity present: will continue keppra 750 mg twice daily   7. Severe protein calorie malnutrition: weight is 142 pounds; albumin is 3.8; will continue supplements as directed will monitor   8. Oropharyngeal dysphagia: without further aspiration present; will continue nectar thick liquids.   9. Aspiration pneumonia: has completed abt; will continue to monitor   10. Acute respiratory failure with hypoxia: is 02 dependent at this time; will not make changes will monitor    MD is aware of resident's narcotic use and is in agreement with current plan of care. We will attempt to wean resident as apropriate   Ok Edwards NP [facility]  Contact [phone] Monday through Friday 8am- 5pm  After hours call  [phone]

## 2017-09-01 DIAGNOSIS — F0150 Vascular dementia without behavioral disturbance: Secondary | ICD-10-CM | POA: Diagnosis not present

## 2017-09-01 DIAGNOSIS — I679 Cerebrovascular disease, unspecified: Secondary | ICD-10-CM | POA: Diagnosis not present

## 2017-09-01 DIAGNOSIS — I350 Nonrheumatic aortic (valve) stenosis: Secondary | ICD-10-CM | POA: Diagnosis not present

## 2017-09-01 DIAGNOSIS — G309 Alzheimer's disease, unspecified: Secondary | ICD-10-CM | POA: Diagnosis not present

## 2017-09-01 DIAGNOSIS — R569 Unspecified convulsions: Secondary | ICD-10-CM | POA: Diagnosis not present

## 2017-09-01 DIAGNOSIS — I10 Essential (primary) hypertension: Secondary | ICD-10-CM | POA: Diagnosis not present

## 2017-09-02 DIAGNOSIS — I10 Essential (primary) hypertension: Secondary | ICD-10-CM | POA: Diagnosis not present

## 2017-09-02 DIAGNOSIS — F339 Major depressive disorder, recurrent, unspecified: Secondary | ICD-10-CM | POA: Diagnosis not present

## 2017-09-02 DIAGNOSIS — I350 Nonrheumatic aortic (valve) stenosis: Secondary | ICD-10-CM | POA: Diagnosis not present

## 2017-09-02 DIAGNOSIS — G309 Alzheimer's disease, unspecified: Secondary | ICD-10-CM | POA: Diagnosis not present

## 2017-09-02 DIAGNOSIS — Z8546 Personal history of malignant neoplasm of prostate: Secondary | ICD-10-CM | POA: Diagnosis not present

## 2017-09-02 DIAGNOSIS — I679 Cerebrovascular disease, unspecified: Secondary | ICD-10-CM | POA: Diagnosis not present

## 2017-09-02 DIAGNOSIS — R569 Unspecified convulsions: Secondary | ICD-10-CM | POA: Diagnosis not present

## 2017-09-02 DIAGNOSIS — F0150 Vascular dementia without behavioral disturbance: Secondary | ICD-10-CM | POA: Diagnosis not present

## 2017-09-02 DIAGNOSIS — R1310 Dysphagia, unspecified: Secondary | ICD-10-CM | POA: Diagnosis not present

## 2017-09-03 ENCOUNTER — Non-Acute Institutional Stay (SKILLED_NURSING_FACILITY): Payer: Medicare Other | Admitting: Internal Medicine

## 2017-09-03 ENCOUNTER — Encounter: Payer: Self-pay | Admitting: Internal Medicine

## 2017-09-03 DIAGNOSIS — I10 Essential (primary) hypertension: Secondary | ICD-10-CM

## 2017-09-03 DIAGNOSIS — F0150 Vascular dementia without behavioral disturbance: Secondary | ICD-10-CM | POA: Diagnosis not present

## 2017-09-03 DIAGNOSIS — F329 Major depressive disorder, single episode, unspecified: Secondary | ICD-10-CM

## 2017-09-03 DIAGNOSIS — G301 Alzheimer's disease with late onset: Secondary | ICD-10-CM | POA: Diagnosis not present

## 2017-09-03 DIAGNOSIS — B001 Herpesviral vesicular dermatitis: Secondary | ICD-10-CM | POA: Diagnosis not present

## 2017-09-03 DIAGNOSIS — F0280 Dementia in other diseases classified elsewhere without behavioral disturbance: Secondary | ICD-10-CM | POA: Diagnosis not present

## 2017-09-03 DIAGNOSIS — R569 Unspecified convulsions: Secondary | ICD-10-CM | POA: Diagnosis not present

## 2017-09-03 DIAGNOSIS — F331 Major depressive disorder, recurrent, moderate: Secondary | ICD-10-CM | POA: Diagnosis not present

## 2017-09-03 DIAGNOSIS — Z8673 Personal history of transient ischemic attack (TIA), and cerebral infarction without residual deficits: Secondary | ICD-10-CM

## 2017-09-03 DIAGNOSIS — F32A Depression, unspecified: Secondary | ICD-10-CM

## 2017-09-03 NOTE — Progress Notes (Signed)
Patient ID: Mike Williams, male   DOB: 11-03-1932, 84 y.o.   MRN: [id number]  Provider:  DR Arletha Grippe Location:  [place] Room Number: [place] of Service:  SNF (31)  PCP: [name], DO Patient Care Team: [name], DO as PCP - General (Internal Medicine) Nyoka Cowden Phylis Bougie, NP as Nurse Practitioner ([place]) [facility], [place] ([place])  Extended Emergency Contact Information Primary Emergency Contact: Fana,[name] Address: [address]          [place], [place] [address] Johnnette Litter of [place] Phone: [phone] Relation: Spouse  Code Status:  DNR Goals of Care: Advanced Directive information Advanced Directives 09/03/2017  Does Patient Have a Medical Advance Directive? Yes  Type of Advance Directive Out of facility DNR (pink MOST or yellow form)  Does patient want to make changes to medical advance directive? No - Patient declined  Copy of [place] in Chart? -  Would patient like information on creating a medical advance directive? -  Pre-existing out of facility DNR order (yellow form or pink MOST form) Pink MOST form placed in chart (order not valid for inpatient use);Yellow form placed in chart (order not valid for inpatient use)      Chief Complaint  Patient presents with  . Readmit To SNF    Readmission    HPI: Patient is a 84 y.o. male seen today for re-admission to SNF following hospital stay for aspiration pneumonia with acute hypoxic respiratory failure, dysphagia, dementia.  Today he is nonverbal. No nursing issues. Appetite is excellent. Sleeps well. He is a poor historian due to dementia. Hx obtained from chart  Hx CVA - stable on ASA 81 mg daily   HTN - BP stable on benicar 40 mg daily, felodinpine ER 10 mg daily, hydralazine 100 mg three times daily   Vascular dementia/Alzheimer's dementia - progressively declining; he has dysphagia and gets nectar thick  liquids; he is O2 dependent at this time. followed by hospice care.   Depression - mood stable on lexapro 5 mg daily   Anemia - stable. Hgb 12  Seizure d/o - stable on keppra 750 mg twice daily   Severe protein calorie malnutrition - stable. He gets nutritional supplements per facility protocol. Albumin 3.8   Past Medical History:  Diagnosis Date  . Aortic insufficiency 07/04/2011   2/13 Study Conclusions  - Left ventricle: Systolic function was normal. The estimated ejection fraction was in the range of 55% to 60%. - Aortic valve: Moderate regurgitation. - Aorta: The aorta was moderately dilated. Transthoracic echocardiography. M-mode, complete 2D, spectral Doppler, and color Doppler. Height: Height: 170.2cm. Height: 67in. Weight: Weight: 84.1kg. Weight: 185lb. Body mass index: BMI: 29kg/m^2. Body surface area: BSA: 2.18m^2. Blood pressure: 175/86. Patient status: Inpatient. Location: Bedside.      . Aortic root dilatation (HCC)    Noted on 09/2010 echo, with mild-mod AI  . CEREBROVASCULAR ACCIDENT, HX OF 02/09/2007  . DEMENTIA 05/12/2007  . Hemorrhoids   . HYPERGLYCEMIA 09/21/2009  . HYPERTENSION 12/29/2006  . Lactose intolerance   . OBSTRUCTIVE SLEEP APNEA 12/29/2006  . PROSTATE CANCER, HX OF 12/29/2006  . Prostatitis   . Risk for falls   . Seizures ([place])    Past Surgical History:  Procedure Laterality Date  . CHOLECYSTECTOMY    . GANGLION CYST EXCISION    . PROSTATE SURGERY     cryotherapy    reports that he has never smoked. He has never used  smokeless tobacco. He reports that he does not drink alcohol or use drugs. Social History   Socioeconomic History  . Marital status: Married    Spouse name: Not on file  . Number of children: 2  . Years of education: master's  . Highest education level: Not on file  Occupational History  . Occupation: retired  Scientific laboratory technician  . Financial resource strain: Not on file  . Food insecurity:    Worry: Not on file    Inability: Not on file    . Transportation needs:    Medical: Not on file    Non-medical: Not on file  Tobacco Use  . Smoking status: Never Smoker  . Smokeless tobacco: Never Used  Substance and Sexual Activity  . Alcohol use: No  . Drug use: No  . Sexual activity: Not on file  Lifestyle  . Physical activity:    Days per week: Not on file    Minutes per session: Not on file  . Stress: Not on file  Relationships  . Social connections:    Talks on phone: Not on file    Gets together: Not on file    Attends religious service: Not on file    Active member of club or organization: Not on file    Attends meetings of clubs or organizations: Not on file    Relationship status: Not on file  . Intimate partner violence:    Fear of current or ex partner: Not on file    Emotionally abused: Not on file    Physically abused: Not on file    Forced sexual activity: Not on file  Other Topics Concern  . 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       Patient does not drink caffeine.   Patient is right handed.    Functional Status Survey:    Family History  Problem Relation Age of Onset  . Heart attack Father   . Heart disease Father   . Cancer Sister     Health Maintenance  Topic Date Due  . INFLUENZA VACCINE  12/12/2017 (Originally 12/03/2017)  . PNA vac Low Risk Adult  Completed  . TETANUS/TDAP  Discontinued    No Known Allergies  Outpatient Encounter Medications as of 09/03/2017  Medication Sig  . acetaminophen (TYLENOL) 500 MG tablet Take 1,000 mg by mouth every 6 (six) hours as needed (pain).   Marland Kitchen aspirin 81 MG chewable tablet Chew 81 mg by mouth daily.  . ATROPINE SULFATE PO Take 4 drops by mouth every 4 (four) hours.  . betamethasone dipropionate (DIPROLENE) 0.05 % cream Apply 1 application topically daily. Apply to Groin area  . Emollient (EUCERIN) lotion Apply topically 2 (two) times daily. For dry skin  . escitalopram (LEXAPRO) 5 MG tablet Take 5 mg  by mouth daily. For depression   . felodipine (PLENDIL) 10 MG 24 hr tablet Take 10 mg by mouth daily.  . hydrALAZINE (APRESOLINE) 100 MG tablet Take 100 mg by mouth 3 (three) times daily.  Marland Kitchen levETIRAcetam (KEPPRA) 750 MG tablet Take 750 mg by mouth 2 (two) times daily.  . Nutritional Supplements (NUTRITIONAL SUPPLEMENT PO) Regular Diet - Puree texture, Nectar thickened fluids consistency  . olmesartan (BENICAR) 40 MG tablet Take 40 mg by mouth daily.   No facility-administered encounter medications on file as of 09/03/2017.     Review of Systems  Unable to perform ROS: Dementia    Vitals:  09/03/17 0851  BP: 120/70  Pulse: 69  Resp: 18  Temp: (!) 97.1 F (36.2 C)  SpO2: 99%  Weight: 142 lb 14.4 oz (64.8 kg)  Height: 5\' 7"  (1.702 m)   Body mass index is 22.38 kg/m. Physical Exam  Constitutional: He is oriented to person, place, and time. He appears well-developed and well-nourished.  Frail appearing in NAD, lying in bed, nonverbal  HENT:  Mouth/Throat: Oropharynx is clear and moist.  MMM; no oral thrush  Eyes: Pupils are equal, round, and reactive to light. No scleral icterus.  Neck: Neck supple. Carotid bruit is not present.  Cardiovascular: Normal rate, regular rhythm and intact distal pulses. Exam reveals no gallop and no friction rub.  Murmur (1/6 SEM) heard. no distal LE swelling. No calf TTP  Pulmonary/Chest: Effort normal and breath sounds normal. He has no wheezes. He has no rales. He exhibits no tenderness.  Abdominal: Soft. Bowel sounds are normal. He exhibits no distension, no abdominal bruit, no pulsatile midline mass and no mass. There is no hepatomegaly. There is no tenderness. There is no rebound and no guarding.  Lymphadenopathy:    He has no cervical adenopathy.  Neurological: He is alert and oriented to person, place, and time. He has normal reflexes.  Skin: Skin is warm and dry. Rash (left upper lip open vesicles with swelling) noted.  Psychiatric: He has  a normal mood and affect. His behavior is normal. Judgment and thought content normal.    Labs reviewed: Basic Metabolic Panel: Recent Labs    06/02/17 1815  06/06/17 0532 08/24/17 2205 08/25/17 0223  NA  --    < > 139 138 136  K  --    < > 3.4* 4.4 3.9  CL  --    < > 105 102 103  CO2  --    < > 28 25 23   GLUCOSE  --    < > 94 125* 111*  BUN  --    < > 16 11 10   CREATININE  --    < > 0.85 0.96 0.91  CALCIUM  --    < > 8.6* 9.4 8.3*  MG 1.9  --   --   --   --   PHOS 3.2  --   --   --   --    < > = values in this interval not displayed.   Liver Function Tests: Recent Labs    05/31/17 2159 06/02/17 1655 08/24/17 2205  AST 36 26 22  ALT 27 21 15*  ALKPHOS 114 97 104  BILITOT 0.6 0.5 0.8  PROT 7.5 7.1 7.8  ALBUMIN 3.4* 3.2* 3.8   No results for input(s): LIPASE, AMYLASE in the last 8760 hours. No results for input(s): AMMONIA in the last 8760 hours. CBC: Recent Labs    06/02/17 1655  06/06/17 0532 08/24/17 2205 08/25/17 0223  WBC 7.2   < > 7.4 9.0 9.0  NEUTROABS 4.4  --   --  6.8 7.1  HGB 13.4   < > 11.4* 14.3 12.0*  HCT 40.2   < > 33.2* 42.5 36.6*  MCV 86.6   < > 84.7 86.6 86.5  PLT 162   < > 170 191 159   < > = values in this interval not displayed.   Cardiac Enzymes: Recent Labs    06/02/17 1655  TROPONINI <0.03   BNP: Invalid input(s): POCBNP Lab Results  Component Value Date   HGBA1C 5.6 04/14/2017  Lab Results  Component Value Date   TSH 1.804 09/23/2014   No results found for: VITAMINB12 No results found for: FOLATE No results found for: IRON, TIBC, FERRITIN  Imaging and Procedures obtained prior to SNF admission: Dg Chest Portable 1 View  Result Date: 08/24/2017 CLINICAL DATA:  Cough.  Dysphagia. EXAM: PORTABLE CHEST 1 VIEW COMPARISON:  06/02/2017 FINDINGS: Mild enlargement of the cardiac silhouette. Apparent widening of the mediastinum. Right paratracheal thickening. Tortuosity and calcific atherosclerotic disease of the aorta.  Elevation of the left hemidiaphragm. There is no evidence of focal airspace consolidation, pleural effusion or pneumothorax. Osseous structures are without acute abnormality. Soft tissues are grossly normal. IMPRESSION: Apparent widening of the mediastinum. This may be due to ectatic ascending and descending thoracic aorta, however aneurysmal dilation of the aorta cannot be excluded. If found clinically appropriate, CT angiogram of the chest may be used for further evaluation. No evidence of lobar consolidation. Electronically Signed   By: Fidela Salisbury [name]   On: 08/24/2017 22:14   Ct Angio Chest/abd/pel For Dissection W And/or W/wo  Result Date: 08/25/2017 CLINICAL DATA:  Acute onset of cough.  Mediastinal widening. EXAM: CT ANGIOGRAPHY CHEST, ABDOMEN AND PELVIS TECHNIQUE: Multidetector CT imaging through the chest, abdomen and pelvis was performed using the standard protocol during bolus administration of intravenous contrast. Multiplanar reconstructed images and MIPs were obtained and reviewed to evaluate the vascular anatomy. CONTRAST:  110mL ISOVUE-370 IOPAMIDOL (ISOVUE-370) INJECTION 76% COMPARISON:  None. FINDINGS: CTA CHEST FINDINGS Cardiovascular: There is aneurysmal dilatation of the aortic root to 5.1 cm in AP dimension. Aneurysmal dilatation persists at the aortic arch, measuring up to 4.1 cm in diameter. The great vessels are grossly unremarkable in appearance. There is no evidence of aortic dissection. There is no evidence of significant pulmonary embolus. The heart is normal in size. Mild calcification is noted at the aortic arch. Mediastinum/Nodes: A 2.5 cm anterior mediastinal mass is noted, of soft tissue density and relatively well-circumscribed in appearance. Given its appearance, this could reflect a thymoma. Scattered coronary artery calcifications are seen. No mediastinal lymphadenopathy is seen. No pericardial effusion is identified. The visualized portions of the thyroid gland are  unremarkable. No axillary lymphadenopathy is seen. Lungs/Pleura: Patchy right-sided airspace opacification raises concern for pneumonia. Mild bibasilar atelectasis is noted. No pleural effusion or pneumothorax is seen. No masses are identified. Musculoskeletal: No acute osseous abnormalities are identified. The visualized musculature is unremarkable in appearance. Review of the MIP images confirms the above findings. CTA ABDOMEN AND PELVIS FINDINGS VASCULAR Aorta: There is mild aneurysmal dilatation of the infrarenal abdominal aorta to 3.1 cm in maximal transverse dimension. There is no evidence of aortic dissection. Scattered calcification is seen along the distal abdominal aorta. Celiac: The celiac trunk is unremarkable in appearance. SMA: The superior mesenteric artery remains patent. Renals: The renal arteries appear patent bilaterally. IMA: The inferior mesenteric artery is unremarkable in appearance. Inflow: There appear to be penetrating aortic ulcerations along the common iliac arteries bilaterally, measuring 0.8 x 0.3 cm on the right and 1.8 x 0.6 cm on the left. The common iliac arteries and their branches are somewhat ectatic. Veins: Visualized venous structures are grossly unremarkable in appearance. Review of the MIP images confirms the above findings. NON-VASCULAR Hepatobiliary: The liver is unremarkable in appearance. The gallbladder is unremarkable in appearance. The common bile duct remains normal in caliber. Pancreas: The pancreas is within normal limits. Spleen: The spleen is unremarkable in appearance. Adrenals/Urinary Tract: The adrenal glands are unremarkable in  appearance. A left renal cyst is noted. Mild left-sided perinephric stranding is noted. There is no evidence of hydronephrosis. No renal or ureteral stones are identified. Stomach/Bowel: The stomach is unremarkable in appearance. The small bowel is within normal limits. The appendix is not visualized; there is no evidence for  appendicitis. The colon is unremarkable in appearance. Mild wall thickening at the rectum could reflect proctitis. Would correlate with the patient's symptoms. Lymphatic: No retroperitoneal or pelvic sidewall lymphadenopathy is seen. Reproductive: The bladder is mildly distended and grossly unremarkable. The prostate is normal in size, with diffuse calcification. Other: No additional soft tissue abnormalities are seen. Musculoskeletal: No acute osseous abnormalities are identified. The visualized musculature is unremarkable in appearance. Review of the MIP images confirms the above findings. IMPRESSION: 1. Right-sided pneumonia noted. 2. Mild wall thickening at the rectum could reflect proctitis. Would correlate with the patient's symptoms. 3. No evidence of aortic dissection. No evidence of significant pulmonary embolus. 4. Aneurysmal dilatation of the aortic root to 5.1 cm in AP dimension. Aneurysmal dilatation persists to the level of the aortic arch, which measures up to 4.1 cm in diameter. Recommend semi-annual imaging followup by CTA or MRA and referral to cardiothoracic surgery if not already obtained. This recommendation follows 2010 ACCF/AHA/AATS/ACR/ASA/SCA/SCAI/SIR/STS/SVM Guidelines for the Diagnosis and Management of Patients With Thoracic Aortic Disease. Circulation. 2010; 121: e266-e36 5. 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 6. Mild aneurysmal dilatation of the infrarenal abdominal aorta to 3.1 cm in transverse dimension. Recommend followup by [place] in 3 years. This recommendation follows ACR consensus guidelines: White Paper of the ACR Incidental Findings Committee II on Vascular Findings. J Am Coll Radiol 2013; 17:793-903 7. 2.5 cm anterior mediastinal soft tissue density mass, relatively well-circumscribed in appearance. Given its appearance, this could reflect a thymoma. MRI of the  chest could be considered for further evaluation, as deemed clinically appropriate. 8. Scattered coronary artery calcifications. Electronically Signed   By: Garald Balding [name]   On: 08/25/2017 02:44    Assessment/Plan   ICD-10-CM   1. Herpes labialis - NEW B00.1    left upper lip  2. Vascular dementia without behavioral disturbance F01.50   3. Late onset Alzheimer's disease without behavioral disturbance G30.1    F02.80   4. History of CVA (cerebrovascular accident) Z86.73   5. Seizures ([place]) R56.9   6. Essential hypertension I10   7. Depression, unspecified depression type F32.9      START ACYCLOVIR 400MG  tid X 7 DAYS FOR HERPES LABIALIS  CHANGE LEXAPRO TO EVERY OTHER DAY X 1 WEEK THEN D/C ALTOGETHER AS HE IS NOT BENEFITING FROM THIS MEDICATION AND MOOD STABLE  Cont other meds as ordered  Cont nutritional supplements as ordered  Hospice to follow - prognosis remains poor  Will follow  Labs/tests ordered: none   Gordon Carlson S. Perlie Gold  [facility] [address] [place], [place] [address] [phone] Cell (Monday-Friday 8 AM - 5 PM) [phone] After 5 PM and follow prompts

## 2017-09-06 DIAGNOSIS — R569 Unspecified convulsions: Secondary | ICD-10-CM | POA: Diagnosis not present

## 2017-09-06 DIAGNOSIS — I350 Nonrheumatic aortic (valve) stenosis: Secondary | ICD-10-CM | POA: Diagnosis not present

## 2017-09-06 DIAGNOSIS — G309 Alzheimer's disease, unspecified: Secondary | ICD-10-CM | POA: Diagnosis not present

## 2017-09-06 DIAGNOSIS — I10 Essential (primary) hypertension: Secondary | ICD-10-CM | POA: Diagnosis not present

## 2017-09-06 DIAGNOSIS — I679 Cerebrovascular disease, unspecified: Secondary | ICD-10-CM | POA: Diagnosis not present

## 2017-09-06 DIAGNOSIS — F0150 Vascular dementia without behavioral disturbance: Secondary | ICD-10-CM | POA: Diagnosis not present

## 2017-09-07 ENCOUNTER — Other Ambulatory Visit: Payer: Self-pay

## 2017-09-07 ENCOUNTER — Non-Acute Institutional Stay (SKILLED_NURSING_FACILITY): Payer: Medicare Other | Admitting: Adult Health

## 2017-09-07 ENCOUNTER — Encounter: Payer: Self-pay | Admitting: Adult Health

## 2017-09-07 DIAGNOSIS — R569 Unspecified convulsions: Secondary | ICD-10-CM | POA: Diagnosis not present

## 2017-09-07 DIAGNOSIS — I639 Cerebral infarction, unspecified: Secondary | ICD-10-CM

## 2017-09-07 DIAGNOSIS — I10 Essential (primary) hypertension: Secondary | ICD-10-CM | POA: Diagnosis not present

## 2017-09-07 DIAGNOSIS — I679 Cerebrovascular disease, unspecified: Secondary | ICD-10-CM | POA: Diagnosis not present

## 2017-09-07 DIAGNOSIS — G301 Alzheimer's disease with late onset: Secondary | ICD-10-CM | POA: Diagnosis not present

## 2017-09-07 DIAGNOSIS — F0280 Dementia in other diseases classified elsewhere without behavioral disturbance: Secondary | ICD-10-CM

## 2017-09-07 DIAGNOSIS — F0150 Vascular dementia without behavioral disturbance: Secondary | ICD-10-CM | POA: Diagnosis not present

## 2017-09-07 DIAGNOSIS — I350 Nonrheumatic aortic (valve) stenosis: Secondary | ICD-10-CM | POA: Diagnosis not present

## 2017-09-07 DIAGNOSIS — E43 Unspecified severe protein-calorie malnutrition: Secondary | ICD-10-CM | POA: Diagnosis not present

## 2017-09-07 DIAGNOSIS — G309 Alzheimer's disease, unspecified: Secondary | ICD-10-CM | POA: Diagnosis not present

## 2017-09-07 MED ORDER — MORPHINE SULFATE (CONCENTRATE) 20 MG/ML PO SOLN: ORAL | 0 refills | Status: AC

## 2017-09-07 MED ORDER — LORAZEPAM 0.5 MG PO TABS: ORAL_TABLET | ORAL | 0 refills | Status: AC

## 2017-09-07 NOTE — Telephone Encounter (Signed)
Rx faxed to [facility] (P) [phone], (F) [phone] 

## 2017-09-07 NOTE — Progress Notes (Signed)
/  Location:   [facility] Room Number: 107 A Place of Service:  SNF (31)   CODE STATUS: DNR (Most form effective 07-08-17)  No Known Allergies  Chief Complaint  Patient presents with  . Medical Management of Chronic Issues    Cva; dementia; malnutrition. Weekly follow up for the first 30 days post hospitalization     HPI:  He is a 84 year old long term resident being seen for the management of his chronic illnesses: cva; dementia; malnutrition. He is followed by hospice care. He is unable to participate in the hpi or ros. He has very little po intake; does frown as though he is uncomfortable; no signs of respiratory depression.   Past Medical History:  Diagnosis Date  . Aortic insufficiency 07/04/2011   2/13 Study Conclusions  - Left ventricle: Systolic function was normal. The estimated ejection fraction was in the range of 55% to 60%. - Aortic valve: Moderate regurgitation. - Aorta: The aorta was moderately dilated. Transthoracic echocardiography. M-mode, complete 2D, spectral Doppler, and color Doppler. Height: Height: 170.2cm. Height: 67in. Weight: Weight: 84.1kg. Weight: 185lb. Body mass index: BMI: 29kg/m^2. Body surface area: BSA: 2.25m^2. Blood pressure: 175/86. Patient status: Inpatient. Location: Bedside.      . Aortic root dilatation (HCC)    Noted on 09/2010 echo, with mild-mod AI  . CEREBROVASCULAR ACCIDENT, HX OF 02/09/2007  . DEMENTIA 05/12/2007  . Hemorrhoids   . HYPERGLYCEMIA 09/21/2009  . HYPERTENSION 12/29/2006  . Lactose intolerance   . OBSTRUCTIVE SLEEP APNEA 12/29/2006  . PROSTATE CANCER, HX OF 12/29/2006  . Prostatitis   . Risk for falls   . Seizures ([place])     Past Surgical History:  Procedure Laterality Date  . CHOLECYSTECTOMY    . GANGLION CYST EXCISION    . PROSTATE SURGERY     cryotherapy    Social History   Socioeconomic History  . Marital status: Married    Spouse name: Not on file  . Number of children: 2  . Years of education:  master's  . Highest education level: Not on file  Occupational History  . Occupation: retired  Scientific laboratory technician  . Financial resource strain: Not on file  . Food insecurity:    Worry: Not on file    Inability: Not on file  . Transportation needs:    Medical: Not on file    Non-medical: Not on file  Tobacco Use  . Smoking status: Never Smoker  . Smokeless tobacco: Never Used  Substance and Sexual Activity  . Alcohol use: No  . Drug use: No  . Sexual activity: Not on file  Lifestyle  . Physical activity:    Days per week: Not on file    Minutes per session: Not on file  . Stress: Not on file  Relationships  . Social connections:    Talks on phone: Not on file    Gets together: Not on file    Attends religious service: Not on file    Active member of club or organization: Not on file    Attends meetings of clubs or organizations: Not on file    Relationship status: Not on file  . Intimate partner violence:    Fear of current or ex partner: Not on file    Emotionally abused: Not on file    Physically abused: Not on file    Forced sexual activity: Not on file  Other Topics Concern  . 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       Patient does not drink caffeine.   Patient is right handed.   Family History  Problem Relation Age of Onset  . Heart attack Father   . Heart disease Father   . Cancer Sister       VITAL SIGNS BP 120/70   Pulse 69   Temp (!) 97.1 F (36.2 C)   Resp 18   Ht 5\' 7"  (1.702 m)   Wt 142 lb 14.4 oz (64.8 kg)   SpO2 95%   BMI 22.38 kg/m   Outpatient Encounter Medications as of 09/07/2017  Medication Sig  . acetaminophen (TYLENOL) 500 MG tablet Take 1,000 mg by mouth every 6 (six) hours as needed (pain).   Marland Kitchen acyclovir (ZOVIRAX) 400 MG tablet Take 400 mg by mouth 3 (three) times daily. X 7 days, ending on 09/11/17  . aspirin 81 MG chewable tablet Chew 81 mg by mouth daily.  . ATROPINE SULFATE PO Take  4 drops by mouth every 4 (four) hours.  . betamethasone dipropionate (DIPROLENE) 0.05 % cream Apply 1 application topically daily. Apply to Groin area  . Emollient (EUCERIN) lotion Apply topically 2 (two) times daily. For dry skin  . escitalopram (LEXAPRO) 5 MG tablet Give one tablet by mouth every other day x 7 days then discontinue  . felodipine (PLENDIL) 10 MG 24 hr tablet Take 10 mg by mouth daily.  . hydrALAZINE (APRESOLINE) 100 MG tablet Take 100 mg by mouth 3 (three) times daily.  Marland Kitchen levETIRAcetam (KEPPRA) 750 MG tablet Take 750 mg by mouth 2 (two) times daily.  . Nutritional Supplements (NUTRITIONAL SUPPLEMENT PO) Regular Diet - Puree texture, Nectar thickened fluids consistency  . olmesartan (BENICAR) 40 MG tablet Take 40 mg by mouth daily.   No facility-administered encounter medications on file as of 09/07/2017.      SIGNIFICANT DIAGNOSTIC EXAMS  DIAGNOSTIC EXAMS  PREVIOUS:    05-31-17: chest x-ray: No acute cardiopulmonary process.   05-31-17: ct of head and spine: No acute intracranial abnormality. Stable cerebral atrophy, chronic small vessel disease, and old lacunar infarcts. Mild right frontal scalp hematoma.  No evidence of skull fracture. No evidence of acute cervical spine fracture or subluxation. Degenerative spondylosis, as described above.   08-24-17: chest x-ray: Apparent widening of the mediastinum. This may be due to ectatic ascending and descending thoracic aorta, however aneurysmal dilation of the aorta cannot be excluded. If found clinically appropriate, CT angiogram of the chest may be used for further evaluation. No evidence of lobar consolidation.  08-25-17: ct angio of chest abdomen and pelvis for dissection: 1. Right-sided pneumonia noted. 2. Mild wall thickening at the rectum could reflect proctitis. Would correlate with the patient's symptoms. 3. No evidence of aortic dissection. No evidence of significant pulmonary embolus. 4. Aneurysmal dilatation of  the aortic root to 5.1 cm in AP dimension. Aneurysmal dilatation persists to the level of the aortic arch, which measures up to 4.1 cm in diameter. Recommend semi-annual imaging followup by CTA or MRA and referral to cardiothoracic surgery if not already obtained.  5. 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 6. Mild aneurysmal dilatation of the infrarenal abdominal aorta to 3.1 cm in transverse dimension. Recommend followup by [place] in 3 years. 7. 2.5 cm anterior mediastinal soft tissue density mass, relatively well-circumscribed in appearance. Given  its appearance, this could reflect a thymoma. MRI of the chest could be considered for further evaluation, as deemed clinically appropriate. 8. Scattered coronary artery calcifications.  NO NEW EXAMS        LABS REVIEWED:PREVIOUS   10-23-16: wbc 9.8; hgb 12.8; hct 37.1; mcv 87.6; plt 125; glucose 131; bun 19.5; creat 0.93; k+  3.6; na++ 150; ca 8.5  liver normal albumin 3.3 urine culture proteus mirabilis: rocephin  05-31-17: wbc 9.0; hgb 14.3; hct 43.2; mcv 87.1; plt 124; glucose 177; bun 12; creat 1.20; k+ 4.0; na+ 136; ca 8.9; liver normal albumin 3.4; blood culture: no growth; rapid flu: + A; urine culture: e-coli   06-03-17: wbc 5.6; hgb 11.8 hct 36.0; mv 85.9; plt 163; glucose 95; bun 20; creat 0.83; k+ 3.5; na++ 141; ca 8.5 06-06-17: wbc 7.4; hgb 11.4; hct 33.2; mcv 84.7; plt 170; glucose 94; bun 16; creat 0.85; k+ 3.4; na++ 139; ca 8.6  08-24-17: wbc 9.0; hgb 14.3; hct 42.5; mcv 86.6; plt 191; glucose 125 bun 11; creat 0.96; k+ 4.4; na++ 138; ca 9.4; liver normal albumin 3.8; urine culture: 40,000 colonies 08-25-17: wbc 9.0; hgb 12.0; hct 36.6; mcv 86.5; plt 159; glucose 111; bun 10; creat 0.91; k+ 3.9; na++ 136 ca 8.3  NO NEW LABS.     Review of Systems  Unable to perform ROS: Dementia (nonverbal )    Physical Exam    Constitutional: No distress.  Frail   Neck: No thyromegaly present.  Cardiovascular: Normal rate, regular rhythm and intact distal pulses.  Murmur heard. 1/6  Pulmonary/Chest: No respiratory distress.  02 dependent Breath sounds diminished    Abdominal: Soft. Bowel sounds are normal. He exhibits no distension. There is no tenderness.  Musculoskeletal: He exhibits no edema.  Lymphadenopathy:    He has no cervical adenopathy.  Neurological:  Aware   Skin: Skin is warm and dry. He is not diaphoretic.    ASSESSMENT/PLAN  TODAY:   1. CVA:  2. 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 Severe protein calorie malnutrition:   Will stop all medications Will begin roxanol 5 mg every 4 hours as needed Will begin atropine drops: 2 drops every 6 hours as needed for excessive secretions Will begin ativan 0.5 mg SL every 6 hours as needed    MD is aware of resident's narcotic use and is in agreement with current plan of care. We will attempt to wean resident as apropriate   Ok Edwards NP [facility]  Contact [phone] Monday through Friday 8am- 5pm  After hours call [phone]

## 2017-09-08 DIAGNOSIS — I350 Nonrheumatic aortic (valve) stenosis: Secondary | ICD-10-CM | POA: Diagnosis not present

## 2017-09-08 DIAGNOSIS — I10 Essential (primary) hypertension: Secondary | ICD-10-CM | POA: Diagnosis not present

## 2017-09-08 DIAGNOSIS — R569 Unspecified convulsions: Secondary | ICD-10-CM | POA: Diagnosis not present

## 2017-09-08 DIAGNOSIS — G309 Alzheimer's disease, unspecified: Secondary | ICD-10-CM | POA: Diagnosis not present

## 2017-09-08 DIAGNOSIS — I679 Cerebrovascular disease, unspecified: Secondary | ICD-10-CM | POA: Diagnosis not present

## 2017-09-08 DIAGNOSIS — F0150 Vascular dementia without behavioral disturbance: Secondary | ICD-10-CM | POA: Diagnosis not present

## 2017-09-09 DIAGNOSIS — F0150 Vascular dementia without behavioral disturbance: Secondary | ICD-10-CM | POA: Diagnosis not present

## 2017-09-09 DIAGNOSIS — R569 Unspecified convulsions: Secondary | ICD-10-CM | POA: Diagnosis not present

## 2017-09-09 DIAGNOSIS — G309 Alzheimer's disease, unspecified: Secondary | ICD-10-CM | POA: Diagnosis not present

## 2017-09-09 DIAGNOSIS — I350 Nonrheumatic aortic (valve) stenosis: Secondary | ICD-10-CM | POA: Diagnosis not present

## 2017-09-09 DIAGNOSIS — I679 Cerebrovascular disease, unspecified: Secondary | ICD-10-CM | POA: Diagnosis not present

## 2017-09-09 DIAGNOSIS — I10 Essential (primary) hypertension: Secondary | ICD-10-CM | POA: Diagnosis not present

## 2017-10-03 DEATH — deceased

## 2018-05-31 IMAGING — DX DG CHEST 1V PORT
1 series · 1 of 1 positions shown · non-contrast
Comparison: Chest radiograph 01/12/2012.

CLINICAL DATA: Patient with fever.

EXAM:
PORTABLE CHEST 1 VIEW

[chest ap]
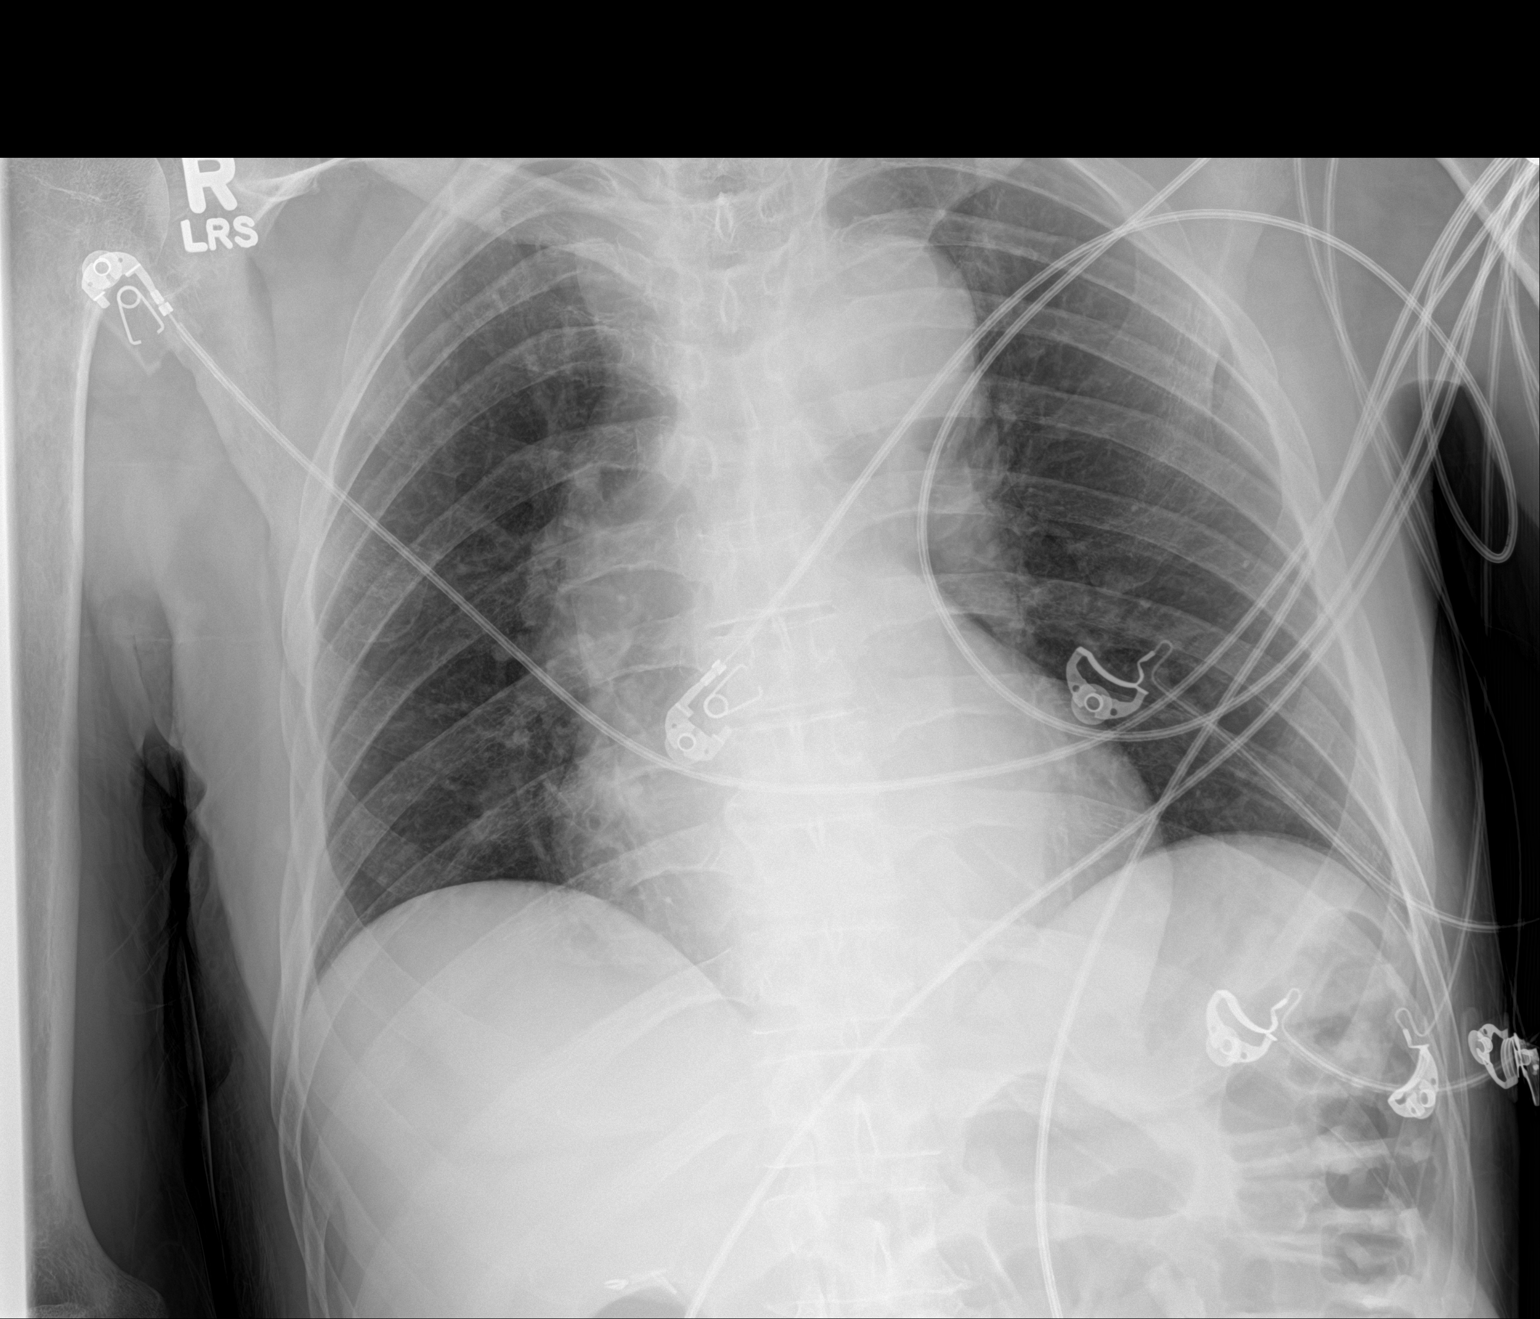

[1 of 1 positions shown; findings below may reference images not displayed]

FINDINGS: Monitoring leads overlie the patient. Stable cardiomegaly. No
consolidative pulmonary opacities. No pleural effusion or
pneumothorax.
IMPRESSION: No acute cardiopulmonary process.

## 2018-08-24 IMAGING — DX DG CHEST 1V PORT
1 series · 1 of 1 positions shown · non-contrast
Comparison: 06/02/2017

CLINICAL DATA: Cough.  Dysphagia.

EXAM:
PORTABLE CHEST 1 VIEW

[chest ap]
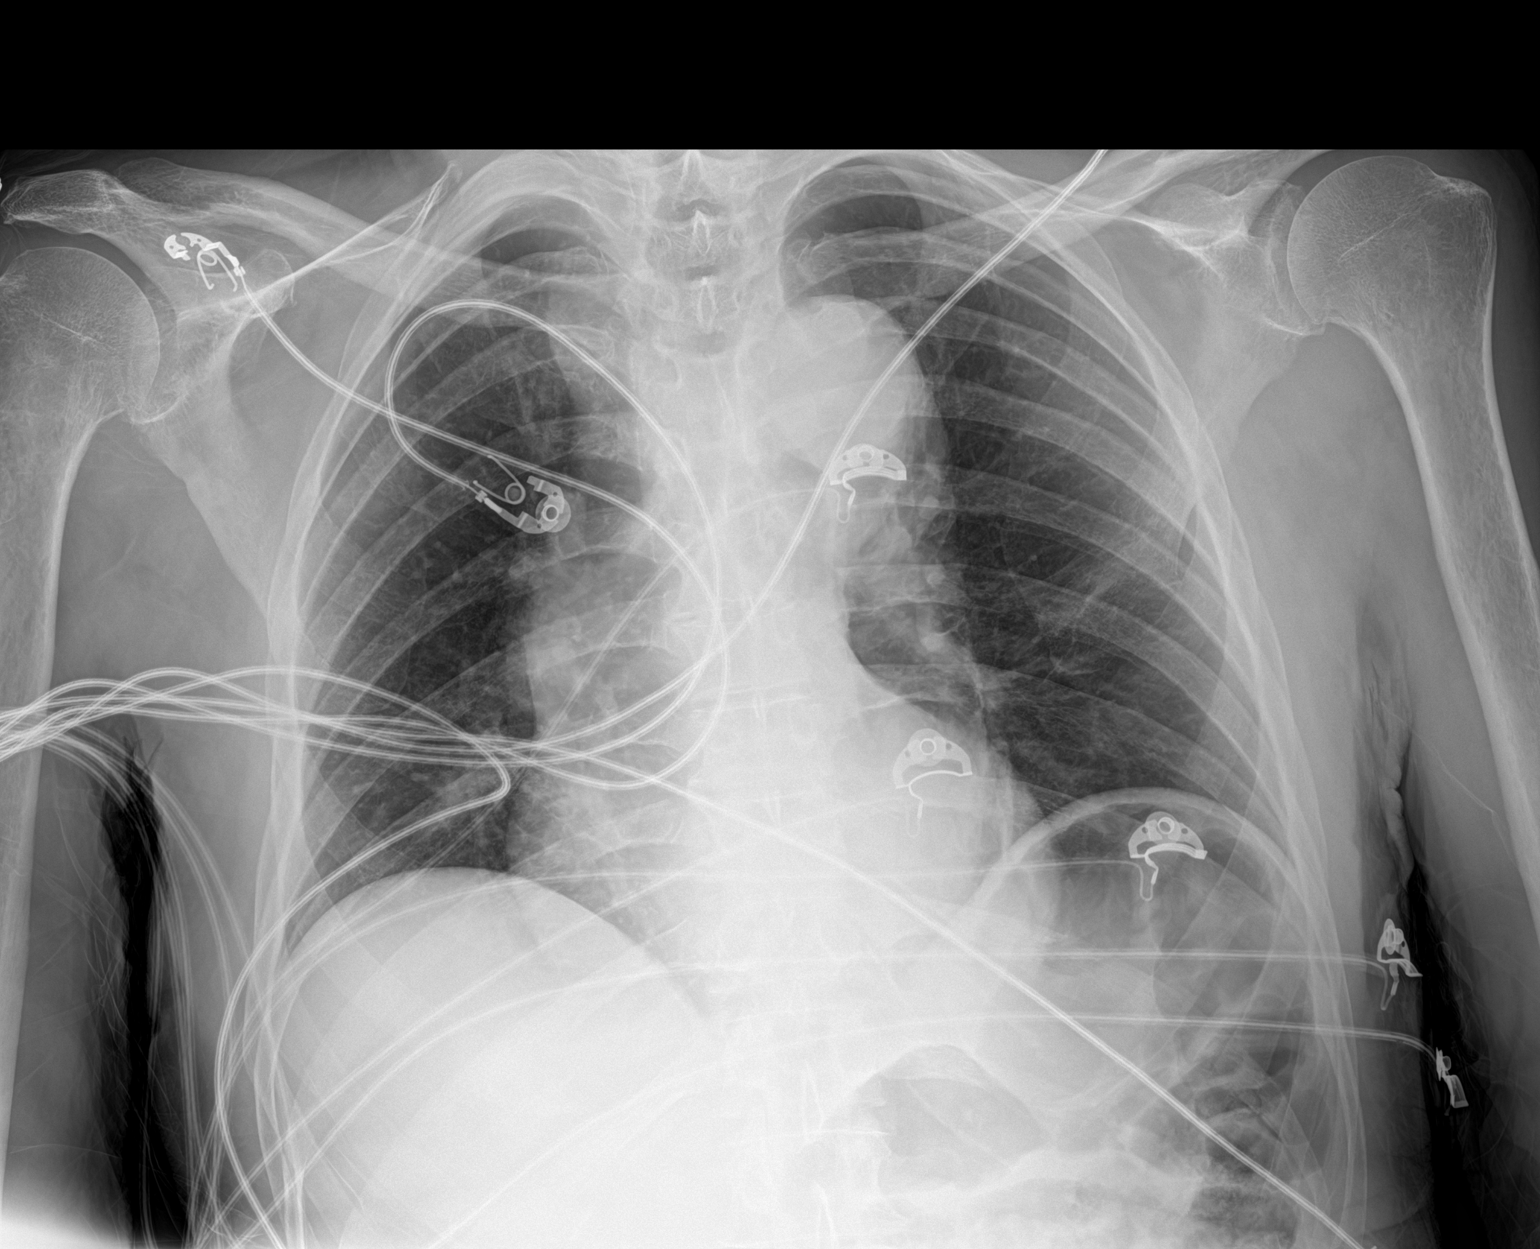

[1 of 1 positions shown; findings below may reference images not displayed]

FINDINGS: Mild enlargement of the cardiac silhouette. Apparent widening of the
mediastinum. Right paratracheal thickening. Tortuosity and calcific
atherosclerotic disease of the aorta. Elevation of the left
hemidiaphragm.

There is no evidence of focal airspace consolidation, pleural
effusion or pneumothorax.

Osseous structures are without acute abnormality. Soft tissues are
grossly normal.
IMPRESSION: Apparent widening of the mediastinum. This may be due to ectatic
ascending and descending thoracic aorta, however aneurysmal dilation
of the aorta cannot be excluded. If found clinically appropriate, CT
angiogram of the chest may be used for further evaluation.

No evidence of lobar consolidation.
